# Patient Record
Sex: Male | Born: 1970 | Race: White | Hispanic: No | Marital: Married | Smoking: Never smoker
Health system: Southern US, Community
[De-identification: ages and names within clinical notes are randomized; demographics above are authoritative.]

## PROBLEM LIST (undated history)

## (undated) DIAGNOSIS — G629 Polyneuropathy, unspecified: Secondary | ICD-10-CM

## (undated) DIAGNOSIS — E119 Type 2 diabetes mellitus without complications: Secondary | ICD-10-CM

## (undated) DIAGNOSIS — I10 Essential (primary) hypertension: Secondary | ICD-10-CM

## (undated) DIAGNOSIS — G4730 Sleep apnea, unspecified: Secondary | ICD-10-CM

## (undated) DIAGNOSIS — D869 Sarcoidosis, unspecified: Secondary | ICD-10-CM

---

## 2004-04-11 ENCOUNTER — Emergency Department: Payer: Self-pay | Admitting: Unknown Physician Specialty

## 2004-11-04 ENCOUNTER — Emergency Department: Payer: Self-pay | Admitting: Emergency Medicine

## 2004-11-04 ENCOUNTER — Other Ambulatory Visit: Payer: Self-pay

## 2004-11-07 ENCOUNTER — Ambulatory Visit: Payer: Self-pay | Admitting: Emergency Medicine

## 2005-05-13 ENCOUNTER — Emergency Department: Payer: Self-pay | Admitting: Emergency Medicine

## 2005-08-19 ENCOUNTER — Emergency Department: Payer: Self-pay | Admitting: Emergency Medicine

## 2005-10-04 ENCOUNTER — Emergency Department: Payer: Self-pay | Admitting: Emergency Medicine

## 2007-05-11 ENCOUNTER — Emergency Department: Payer: Self-pay | Admitting: Emergency Medicine

## 2007-05-11 ENCOUNTER — Other Ambulatory Visit: Payer: Self-pay

## 2008-01-07 ENCOUNTER — Emergency Department: Payer: Self-pay | Admitting: Emergency Medicine

## 2008-01-26 ENCOUNTER — Emergency Department: Payer: Self-pay | Admitting: Emergency Medicine

## 2008-02-18 ENCOUNTER — Emergency Department: Payer: Self-pay | Admitting: Emergency Medicine

## 2008-07-13 ENCOUNTER — Emergency Department: Payer: Self-pay | Admitting: Emergency Medicine

## 2010-06-15 ENCOUNTER — Emergency Department: Payer: Self-pay | Admitting: Emergency Medicine

## 2010-09-05 ENCOUNTER — Emergency Department: Payer: Self-pay | Admitting: Emergency Medicine

## 2010-10-19 ENCOUNTER — Emergency Department: Payer: Self-pay | Admitting: Emergency Medicine

## 2011-09-06 ENCOUNTER — Emergency Department: Payer: Self-pay

## 2011-09-06 LAB — BASIC METABOLIC PANEL
Anion Gap: 9 (ref 7–16)
BUN: 14 mg/dL (ref 7–18)
Calcium, Total: 8.7 mg/dL (ref 8.5–10.1)
Co2: 27 mmol/L (ref 21–32)
Creatinine: 0.74 mg/dL (ref 0.60–1.30)
EGFR (African American): >60
Glucose: 279 mg/dL — ABNORMAL HIGH (ref 65–99)
Osmolality: 281 (ref 275–301)
Potassium: 4 mmol/L (ref 3.5–5.1)

## 2011-09-06 LAB — CK TOTAL AND CKMB (NOT AT ARMC)
CK, Total: 58 U/L (ref 35–232)
CK, Total: 48 U/L (ref 35–232)
CK-MB: 1.2 ng/mL (ref 0.5–3.6)
CK-MB: 0.9 ng/mL (ref 0.5–3.6)

## 2011-09-06 LAB — CBC
HGB: 16.4 g/dL (ref 13.0–18.0)
MCH: 31 pg (ref 26.0–34.0)
Platelet: 218 10*3/uL (ref 150–440)
RBC: 5.28 10*6/uL (ref 4.40–5.90)
RDW: 12 % (ref 11.5–14.5)
WBC: 8.1 10*3/uL (ref 3.8–10.6)

## 2011-09-06 LAB — TROPONIN I: Troponin-I: 0.03 ng/mL

## 2011-09-29 ENCOUNTER — Emergency Department: Payer: Self-pay | Admitting: *Deleted

## 2011-12-01 ENCOUNTER — Emergency Department: Payer: Self-pay | Admitting: Emergency Medicine

## 2012-12-01 ENCOUNTER — Inpatient Hospital Stay: Payer: Self-pay | Admitting: Internal Medicine

## 2012-12-01 LAB — HEMOGLOBIN A1C: Hemoglobin A1C: 12.3 % — ABNORMAL HIGH (ref 4.2–6.3)

## 2012-12-01 LAB — CBC WITH DIFFERENTIAL/PLATELET
Basophil #: 0.1 10*3/uL (ref 0.0–0.1)
Basophil %: 0.4 %
Eosinophil %: 0.2 %
HCT: 46.5 % (ref 40.0–52.0)
MCH: 30.9 pg (ref 26.0–34.0)
MCHC: 34.2 g/dL (ref 32.0–36.0)
Monocyte %: 8.4 %
Neutrophil %: 84 %
Platelet: 190 10*3/uL (ref 150–440)
RDW: 12.2 % (ref 11.5–14.5)
WBC: 17.4 10*3/uL — ABNORMAL HIGH (ref 3.8–10.6)

## 2012-12-01 LAB — COMPREHENSIVE METABOLIC PANEL
Albumin: 3.9 g/dL (ref 3.4–5.0)
BUN: 13 mg/dL (ref 7–18)
Bilirubin,Total: 1.1 mg/dL — ABNORMAL HIGH (ref 0.2–1.0)
Calcium, Total: 9 mg/dL (ref 8.5–10.1)
Chloride: 96 mmol/L — ABNORMAL LOW (ref 98–107)
EGFR (African American): >60
SGPT (ALT): 31 U/L (ref 12–78)
Sodium: 130 mmol/L — ABNORMAL LOW (ref 136–145)
Total Protein: 7.4 g/dL (ref 6.4–8.2)

## 2012-12-02 LAB — CBC WITH DIFFERENTIAL/PLATELET
Basophil %: 0.2 %
HGB: 14.4 g/dL (ref 13.0–18.0)
Lymphocyte %: 14.1 %
MCH: 31.1 pg (ref 26.0–34.0)
MCHC: 34.3 g/dL (ref 32.0–36.0)
Neutrophil #: 9.3 10*3/uL — ABNORMAL HIGH (ref 1.4–6.5)
Neutrophil %: 68.7 %
RBC: 4.64 10*6/uL (ref 4.40–5.90)

## 2012-12-03 LAB — CBC WITH DIFFERENTIAL/PLATELET
Basophil #: 0 10*3/uL (ref 0.0–0.1)
Basophil %: 0.3 %
HCT: 39.8 % — ABNORMAL LOW (ref 40.0–52.0)
HGB: 13.6 g/dL (ref 13.0–18.0)
Lymphocyte %: 29.3 %
MCH: 31.1 pg (ref 26.0–34.0)
MCHC: 34.2 g/dL (ref 32.0–36.0)
MCV: 91 fL (ref 80–100)
Monocyte %: 16.6 %
Neutrophil #: 4.6 10*3/uL (ref 1.4–6.5)
Neutrophil %: 50.2 %
RDW: 12.4 % (ref 11.5–14.5)

## 2012-12-03 LAB — LIPID PANEL
Cholesterol: 140 mg/dL (ref 0–200)
HDL Cholesterol: 31 mg/dL — ABNORMAL LOW (ref 40–60)
Ldl Cholesterol, Calc: 70 mg/dL (ref 0–100)
Triglycerides: 197 mg/dL (ref 0–200)
VLDL Cholesterol, Calc: 39 mg/dL (ref 5–40)

## 2012-12-04 LAB — CREATININE, SERUM
EGFR (African American): >60
EGFR (Non-African Amer.): >60

## 2012-12-06 LAB — CULTURE, BLOOD (SINGLE)

## 2013-02-27 ENCOUNTER — Emergency Department: Payer: Self-pay | Admitting: Emergency Medicine

## 2014-06-29 ENCOUNTER — Emergency Department
Admit: 2014-06-29 | Disposition: A | Discharge status: Home / Self Care | Payer: Self-pay | Admitting: Emergency Medicine

## 2014-07-03 NOTE — Discharge Summary (Signed)
PATIENT NAME:  Fernando Vega, Fernando Vega MR#:  [id number] DATE OF BIRTH:  02/05/1971  DATE OF ADMISSION:  12/01/2012 DATE OF DISCHARGE:  12/05/2012  DISCHARGE DIAGNOSES: 1.  Left leg cellulitis. 2.  Diabetes mellitus type 2.  3.  Systemic antiinflammatory response syndrome.  DISCHARGE MEDICATIONS: 1.  Percocet 5/325 mg 1 tablet every 4 hours as needed, 30 tablets are given.  2.  Metformin 500 mg p.o. b.i.d. 3.  Insulin sliding scale 4.  Levaquin 750 mg daily for 10 days.   DIET: Low sodium, ADA.  CONSULTATIONS: None.   HOSPITAL COURSE:  1.  A 44 year old male patient with history of diabetes, not taking medications, came in because of pain and swelling and redness around the left ankle for about 2 to 3 days. The patient had cracked skin on the heel and noticed redness and swelling in the left lower extremity. The patient had x-ray of the ankle which was negative for any foreign body. An extremity ultrasound of the left leg did not show any DVT. The patient was admitted to hospitalist service because of cellulitis. The patient had SIRS presentation on admission with temperature 99, heart rate 102 and white count 17,000. So he was admitted to the hospitalist service. He was started on IV fluids along with cefazolin. The patient persistently had redness and swelling and tachycardia. I changed antibiotic to the vanc and Levaquin and then the patient received vanc and Levaquin. Today will be the third day. The patient's swelling and redness are improved. He is afebrile with temperature 97.9 today and tachycardia also resolved. Heart rate is 75. The patient's WBC was down from 17.4 to 9.1 on 09/23. The patient will be going on Levaquin for 10 days.  2.  Diabetes mellitus type 2. The patient stopped medication.  Hemoglobin A1c is 12.3 and started on metformin and got diabetic education, got a glucometer. Blood cultures were negative. The patient's sugars have been around 200 range, but we just started metformin so  I advised him to follow up with the [facility]. The patient does not have a primary doctor. [name] LDL was at 70 on admission and his blood pressure is around 117/78, heart rate 75. Stable for discharge. He needs a followup appointment regarding his diabetes, with his foot care, also eye care, routine labs and following up of Hemoglobin A1c in 3 months.   TIME SPENT ON DISCHARGE PREPARATION: More than 30 minutes.  ____________________________ Fernando Lesches, MD sk:sb D: 12/05/2012 11:44:28 ET T: 12/05/2012 12:07:06 ET JOB#: [id number]  cc: Fernando Lesches, MD, <Dictator> Fernando Lesches MD ELECTRONICALLY SIGNED 12/24/2012 10:48

## 2014-07-03 NOTE — H&P (Signed)
PATIENT NAME:  Fernando Vega, Fernando Vega Fernando#:  [id number] DATE OF BIRTH:  [birth year]  DATE OF ADMISSION:  12/01/2012  PRIMARY CARE PHYSICIAN: None.   CHIEF COMPLAINT: Left lower extremity swelling and pain around the ankle for 2 to 3 days. Fernando Vega is a 44 year old Caucasian gentleman with past medical history of sarcoidosis,  type 2 diabetes and hypertension who has been noncompliant with his meds for many years, comes in with redness, swelling and pain at the left ankle. The patient has very dry skin around his ankle and has some cracked skin on it heel and started noticing some redness with swelling and pain in his left lower extremity. He was found to have cellulitis. He reports cellulitis around the left ankle. He reports no injury to the left ankle or left heel. X-ray of the left ankle is negative for any foreign body. He did receive vancomycin and Zosyn in the Emergency Room. White count is 17,000. He is being admitted for SIRS secondary to left lower extremity cellulitis. He was found to have temperature of 99 with pulse of 102.   PAST MEDICAL HISTORY:  1.  Sarcoidosis, not on any treatment.  2.  Hypertension.  3.  Type 2 diabetes.  4.  History of tonsillectomy.   MEDICATIONS: None.   ALLERGIES: No known drug allergies.   SOCIAL HISTORY: Lives at home with wife, nonsmoker. 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   FAMILY HISTORY: Positive for diabetes in father.   REVIEW OF SYSTEMS:  CONSTITUTIONAL: No fever, fatigue, weakness.  EYES: No blurred or double vision. No glaucoma.  ENT: No tinnitus, ear pain, hearing loss or epistaxis.  RESPIRATORY: No cough, wheeze, hemoptysis or chronic obstructive pulmonary disease.   CARDIOVASCULAR: No chest pain, orthopnea, edema or arrhythmia.  GASTROINTESTINAL: No nausea, vomiting, diarrhea, abdominal pain or hematemesis.  GENITOURINARY: No dysuria, hematuria, frequency.  ENDOCRINE: No polyuria, nocturia or thyroid problems.  HEMATOLOGY: No  anemia or easy bruising.  SKIN: Positive for cellulitis, erythema over the left ankle.  MUSCULOSKELETAL: No arthritis, swelling or gout. NEUROLOGIC:  No CVA, transient ischemic attack or dementia.  PSYCHIATRIC: No anxiety or depression.  All other systems reviewed are negative.   PHYSICAL EXAMINATION: GENERAL: The patient is awake, alert, oriented x 3, not in acute distress.  VITAL SIGNS:  Temperature is 99, pulse is 102 and regular,  respirations 18, blood pressure 127/80. Pulse oximetry is 98% on room air.  HEENT: Atraumatic, normocephalic. Pupils are equal, round and reactive to light and accommodation. EOM intact. Oral mucosa is moist.  NECK: Supple. No JVD. No carotid bruit.  LUNGS: Clear to auscultation bilaterally. No rales, rhonchi, respiratory distress or labored breathing.  CARDIOVASCULAR:  Both the heart sounds are normal. Rate is tachycardic, rhythm is regular. PMI not lateralized. Chest is nontender good pedal pulses, good femoral pulses. There is 1+ pitting edema. In the left lower extremity, there is cellulitis over the distal part of the tibial shin. The patient has cracked skin over the heel. There is no serosanguineous  or any abscess formation.  NEUROLOGIC: Grossly intact cranial nerves II through XII. No motor deficit. The patient does have mild peripheral sensory neuropathy bilaterally in the lower extremities.  PSYCHIATRIC: The patient is awake, alert, oriented x3.   LABORATORY, DIAGNOSTIC AND RADIOLOGIC DATA:  White count is 17.4. CBC within normal limits. Glucose is 272, BUN is 13, creatinine is 0.7, sodium 130, potassium is 3.9, chloride is 96. LFTs within normal limits.   X-ray  of the ankle shows no acute osseous injury to the left ankle.   Ultrasound Doppler lower extremity: No deep vein thrombosis on the left.   ASSESSMENT AND PLAN: A 44 year old Fernando Vega with history of hypertension, diabetes and sarcoidosis, comes in with:  1. Systemic inflammatory response  syndrome secondary to left lower extremity cellulitis. The patient is going to be admitted on the medical floor. We will give continue IV cefazolin . The patient does not have any major open wounds other than cracked skin on her left heel. Foot care was advised to the patient, given his diabetes and to consider outpatient podiatry follow up if possible. The patient does have financial constraints. He is asked to wear special padded boot to avoid any callus or corn formation in the foot. We will follow up blood cultures white count.  2.  Type 2 diabetes, uncontrolled and untreated secondary to financial constraints. I will start the patient on metformin 500 b.i.d., Accu-Cheks around-the-clock. The patient recommended to check sugars and dietary counseling was done.  3.  History of hypertension. Blood pressure at present is stable. I will hold off on any antihypertensives at this time. If blood pressure remains on the high side, consider starting ACE inhibitor is in the setting of diabetes.  4.  Deep vein thrombosis prophylaxis with subcutaneous heparin.  5.  Further work-up according to the patient's clinical course. Hospital admission plan was discussed with the patient and the patient's wife.   TIME SPENT: 50 minutes.    ____________________________ Hart Rochester Posey Pronto, MD sap:cc D: 12/01/2012 18:26:18 ET T: 12/01/2012 19:16:07 ET JOB#: [id number]  cc: Fernando Jahn A. Posey Pronto, MD, <Dictator> Ilda Basset MD ELECTRONICALLY SIGNED 12/02/2012 13:14

## 2014-11-01 ENCOUNTER — Emergency Department: Payer: Self-pay

## 2014-11-01 ENCOUNTER — Emergency Department
Admission: EM | Admit: 2014-11-01 | Discharge: 2014-11-01 | Disposition: A | Discharge status: Home / Self Care | Payer: Self-pay | Attending: Emergency Medicine | Admitting: Emergency Medicine

## 2014-11-01 ENCOUNTER — Encounter: Payer: Self-pay | Admitting: Emergency Medicine

## 2014-11-01 DIAGNOSIS — Y9289 Other specified places as the place of occurrence of the external cause: Secondary | ICD-10-CM | POA: Insufficient documentation

## 2014-11-01 DIAGNOSIS — S53402A Unspecified sprain of left elbow, initial encounter: Secondary | ICD-10-CM | POA: Insufficient documentation

## 2014-11-01 DIAGNOSIS — S46912A Strain of unspecified muscle, fascia and tendon at shoulder and upper arm level, left arm, initial encounter: Secondary | ICD-10-CM

## 2014-11-01 DIAGNOSIS — Y9389 Activity, other specified: Secondary | ICD-10-CM | POA: Insufficient documentation

## 2014-11-01 DIAGNOSIS — Y990 Civilian activity done for income or pay: Secondary | ICD-10-CM | POA: Insufficient documentation

## 2014-11-01 DIAGNOSIS — E119 Type 2 diabetes mellitus without complications: Secondary | ICD-10-CM | POA: Insufficient documentation

## 2014-11-01 DIAGNOSIS — X58XXXA Exposure to other specified factors, initial encounter: Secondary | ICD-10-CM | POA: Insufficient documentation

## 2014-11-01 HISTORY — DX: Polyneuropathy, unspecified: G62.9

## 2014-11-01 HISTORY — DX: Type 2 diabetes mellitus without complications: E11.9

## 2014-11-01 MED ORDER — ETODOLAC 400 MG PO TABS: 400.0000 mg | ORAL_TABLET | Freq: Two times a day (BID) | ORAL | Status: DC

## 2014-11-01 MED ORDER — HYDROCODONE-ACETAMINOPHEN 5-325 MG PO TABS: 1.0000 | ORAL_TABLET | ORAL | Status: DC | PRN

## 2014-11-01 MED ORDER — HYDROCODONE-ACETAMINOPHEN 5-325 MG PO TABS
1.0000 | ORAL_TABLET | Freq: Once | ORAL | Status: AC
  Administered 2014-11-01: 1 via ORAL
  Filled 2014-11-01: qty 1

## 2014-11-01 NOTE — Discharge Instructions (Signed)
Cryotherapy Cryotherapy is when you put ice on your injury. Ice helps lessen pain and puffiness (swelling) after an injury. Ice works the best when you start using it in the first 24 to 48 hours after an injury. HOME CARE  Put a dry or damp towel between the ice pack and your skin.  You may press gently on the ice pack.  Leave the ice on for no more than 10 to 20 minutes at a time.  Check your skin after 5 minutes to make sure your skin is okay.  Rest at least 20 minutes between ice pack uses.  Stop using ice when your skin loses feeling (numbness).  Do not use ice on someone who cannot tell you when it hurts. This includes small children and people with memory problems (dementia). GET HELP RIGHT AWAY IF:  You have white spots on your skin.  Your skin turns blue or pale.  Your skin feels waxy or hard.  Your puffiness gets worse. MAKE SURE YOU:   Understand these instructions.  Will watch your condition.  Will get help right away if you are not doing well or get worse. Document Released: 08/16/2007 Document Revised: 05/22/2011 Document Reviewed: 10/20/2010 Exit[employer] Patient Information 2015 [employer]. This information is not intended to replace advice given to you by your health care provider. Make sure you discuss any questions you have with your health care provider.  

## 2014-11-01 NOTE — ED Provider Notes (Signed)
[facility] Emergency Department Provider Note  ____________________________________________  Time seen: Approximately 12:59 PM  I have reviewed the triage vital signs and the nursing notes.   HISTORY  Chief Complaint Arm Pain and Elbow Pain   HPI Fernando Vega is a 44 y.o. male is here complaining of left elbow pain 2 days. He states that he was working on the back of his truck and lifted a bag. He states he felt an immediate "pop" in his left elbow. He has not taken any medication for this. Today his pain has increased greatly and he is unable to move his elbow. He denies any previous injury to his elbow. Currently his pain is 10 out of 10   Past Medical History  Diagnosis Date  . Diabetes mellitus without complication   . Neuropathy     There are no active problems to display for this patient.   History reviewed. No pertinent past surgical history.  Current Outpatient Rx  Name  Route  Sig  Dispense  Refill  . etodolac (LODINE) 400 MG tablet   Oral   Take 1 tablet (400 mg total) by mouth 2 (two) times daily.   20 tablet   0   . HYDROcodone-acetaminophen (NORCO/VICODIN) 5-325 MG per tablet   Oral   Take 1 tablet by mouth every 4 (four) hours as needed for moderate pain.   20 tablet   0     Allergies Review of patient's allergies indicates no known allergies.  No family history on file.  Social History Social History  Substance Use Topics  . Smoking status: Never Smoker   . Smokeless tobacco: None  . Alcohol Use: No    Review of Systems Constitutional: No fever/chills Cardiovascular: Denies chest pain. Respiratory: Denies shortness of breath. Gastrointestinal: No abdominal pain.  No nausea, no vomiting.  Genitourinary: Negative for dysuria. Musculoskeletal: Negative for back pain. Skin: Negative for rash. Neurological: Negative for headaches, focal weakness or numbness.  10-point ROS otherwise  negative.  ____________________________________________   PHYSICAL EXAM:  VITAL SIGNS: ED Triage Vitals  Enc Vitals Group     BP 11/01/14 1134 116/76 mmHg     Pulse Rate 11/01/14 1134 95     Resp 11/01/14 1134 18     Temp 11/01/14 1134 98 F (36.7 C)     Temp Source 11/01/14 1134 Oral     SpO2 11/01/14 1134 100 %     Weight 11/01/14 1134 196 lb (88.905 kg)     Height 11/01/14 1134 5\' 7"  (1.702 m)     Head Cir --      Peak Flow --      Pain Score 11/01/14 1135 10     Pain Loc --      Pain Edu? --      Excl. in [place]? --     Constitutional: Alert and oriented. Well appearing and in no acute distress. Patient sleeping Eyes: Conjunctivae are normal. PERRL. EOMI. Head: Atraumatic. Nose: No congestion/rhinnorhea. Neck: No stridor.   Cardiovascular: Normal rate, regular rhythm. Grossly normal heart sounds.  Good peripheral circulation. Respiratory: Normal respiratory effort.  No retractions. Lungs CTAB. Gastrointestinal: Soft and nontender. No distention Musculoskeletal: Examination of left elbow no gross deformity. There is moderate tenderness on palpation lateral and posterior aspect. Range of motion is restricted secondary to patient's pain. No effusion was noted area No lower extremity tenderness nor edema. Neurologic:  Normal speech and language. No gross focal neurologic deficits are appreciated. No  gait instability. Skin:  Skin is warm, dry and intact. No rash noted. No erythema or ecchymosis Psychiatric: Mood and affect are normal. Speech and behavior are normal.  ____________________________________________   LABS (all labs ordered are listed, but only abnormal results are displayed)  Labs Reviewed - No data to display _ RADIOLOGY  Left elbow x-ray per radiologist and reviewed by me no evidence of fracture, dislocation or effusion. I, [name], personally viewed and evaluated these images (plain radiographs) as part of my medical decision making.   ____________________________________________   PROCEDURES  Procedure(s) performed: None  Critical Care performed: No  ____________________________________________   INITIAL IMPRESSION / ASSESSMENT AND PLAN / ED COURSE  Pertinent labs & imaging results that were available during my care of the patient were reviewed by me and considered in my medical decision making (see chart for details).  Patient was placed in a sling, he is to continue using ice, and he is given a prescription for Norco for pain. He is to follow-up with orthopedist if any continued problems. ____________________________________________   FINAL CLINICAL IMPRESSION(S) / ED DIAGNOSES  Final diagnoses:  Elbow strain, left, initial encounter      [name], PA-C 11/01/14 1408  Harvest Dark, MD 11/01/14 1525

## 2014-11-01 NOTE — ED Notes (Signed)
Pt states on Friday he was working with a tote on the back of his truck and he fell and felt a "pop" in left elbow

## 2015-01-18 ENCOUNTER — Emergency Department: Payer: Self-pay

## 2015-01-18 ENCOUNTER — Encounter: Payer: Self-pay | Admitting: Emergency Medicine

## 2015-01-18 ENCOUNTER — Emergency Department
Admission: EM | Admit: 2015-01-18 | Discharge: 2015-01-18 | Disposition: A | Discharge status: Home / Self Care | Payer: Self-pay | Attending: Emergency Medicine | Admitting: Emergency Medicine

## 2015-01-18 DIAGNOSIS — R51 Headache: Secondary | ICD-10-CM | POA: Insufficient documentation

## 2015-01-18 DIAGNOSIS — S0501XA Injury of conjunctiva and corneal abrasion without foreign body, right eye, initial encounter: Secondary | ICD-10-CM | POA: Insufficient documentation

## 2015-01-18 DIAGNOSIS — Y9289 Other specified places as the place of occurrence of the external cause: Secondary | ICD-10-CM | POA: Insufficient documentation

## 2015-01-18 DIAGNOSIS — Z791 Long term (current) use of non-steroidal anti-inflammatories (NSAID): Secondary | ICD-10-CM | POA: Insufficient documentation

## 2015-01-18 DIAGNOSIS — Y9389 Activity, other specified: Secondary | ICD-10-CM | POA: Insufficient documentation

## 2015-01-18 DIAGNOSIS — E119 Type 2 diabetes mellitus without complications: Secondary | ICD-10-CM | POA: Insufficient documentation

## 2015-01-18 DIAGNOSIS — X58XXXA Exposure to other specified factors, initial encounter: Secondary | ICD-10-CM | POA: Insufficient documentation

## 2015-01-18 DIAGNOSIS — Y998 Other external cause status: Secondary | ICD-10-CM | POA: Insufficient documentation

## 2015-01-18 MED ORDER — FLUORESCEIN SODIUM 1 MG OP STRP
ORAL_STRIP | OPHTHALMIC | Status: AC
  Filled 2015-01-18: qty 1

## 2015-01-18 MED ORDER — TETRACAINE HCL 0.5 % OP SOLN
2.0000 [drp] | Freq: Once | OPHTHALMIC | Status: AC
  Administered 2015-01-18: 2 [drp] via OPHTHALMIC
  Filled 2015-01-18: qty 2

## 2015-01-18 MED ORDER — CIPROFLOXACIN HCL 0.3 % OP SOLN: 2.0000 [drp] | OPHTHALMIC | Status: DC

## 2015-01-18 MED ORDER — CIPROFLOXACIN HCL 0.3 % OP SOLN: 1.0000 [drp] | OPHTHALMIC | Status: AC

## 2015-01-18 MED ORDER — CIPROFLOXACIN HCL 0.3 % OP SOLN
OPHTHALMIC | Status: AC
  Administered 2015-01-18: 2 [drp]
  Filled 2015-01-18: qty 2.5

## 2015-01-18 MED ORDER — OXYCODONE-ACETAMINOPHEN 5-325 MG PO TABS: 1.0000 | ORAL_TABLET | Freq: Four times a day (QID) | ORAL | Status: DC | PRN

## 2015-01-18 MED ORDER — OXYCODONE-ACETAMINOPHEN 5-325 MG PO TABS
1.0000 | ORAL_TABLET | Freq: Once | ORAL | Status: AC
  Administered 2015-01-18: 1 via ORAL
  Filled 2015-01-18: qty 1

## 2015-01-18 MED ORDER — FLUORESCEIN SODIUM 1 MG OP STRP
1.0000 | ORAL_STRIP | Freq: Once | OPHTHALMIC | Status: DC
  Filled 2015-01-18: qty 1

## 2015-01-18 NOTE — ED Notes (Signed)
Pt reports right eye pain since last night; unknown if foreign body. Pt reports "feels like it's gonna pop out of my head"; denies itching or burning, describes it as pressure. White of eye is reddened at this time.

## 2015-01-18 NOTE — Discharge Instructions (Signed)

## 2015-01-18 NOTE — ED Provider Notes (Signed)
CSN: [id number]     Arrival date & time 01/18/15  1554 History   First MD Initiated Contact with Patient 01/18/15 1643     Chief Complaint  Patient presents with  . Eye Pain     (Consider location/radiation/quality/duration/timing/severity/associated sxs/prior Treatment) Patient is a 44 y.o. male presenting with eye pain.  Eye Pain Associated symptoms include headaches. Pertinent negatives include no abdominal pain, chest pain, chills, congestion, coughing, fever, nausea, rash, sore throat or vomiting.    Eye pain began 1 day ago. He denies any trauma or injury. Patient did state he was rubbing his eye when he developed the discomfort. Today, vision became blurry. Pain is severe, increased with blinking. Pain is increased with bright lights. There is no history of foreign body. Patient does not work with metal shavings. He denies any drainage warmth or fevers. He denies any spots, flashes of light, halos, curtain dropping over his vision. He does complain of a headache on the right side. Headache has been present since right eye pain. He has no history of glaucoma. He does not wear contact lenses.  He had a similar episode in his left eye last year, which he says was an infection that was successfully treated.  Past Medical History  Diagnosis Date  . Diabetes mellitus without complication ([place])   . Neuropathy ([place])    History reviewed. No pertinent past surgical history. No family history on file. Social History  Substance Use Topics  . Smoking status: Never Smoker   . Smokeless tobacco: None  . Alcohol Use: No    Review of Systems  Constitutional: Negative.  Negative for fever, chills, activity change and appetite change.  HENT: Negative for congestion, ear discharge, ear pain, rhinorrhea, sinus pressure and sore throat.   Eyes: Positive for photophobia, pain and redness. Negative for discharge, itching and visual disturbance.       No foreign body sensation.  Respiratory:  Negative for cough, chest tightness and shortness of breath.   Cardiovascular: Negative for chest pain and palpitations.  Gastrointestinal: Negative for nausea, vomiting, abdominal pain, diarrhea and abdominal distention.  Skin: Negative for color change and rash.  Neurological: Positive for headaches. Negative for dizziness and light-headedness.  Hematological: Negative for adenopathy.  Psychiatric/Behavioral: Negative for behavioral problems and agitation.      Allergies  Review of patient's allergies indicates no known allergies.  Home Medications   Prior to Admission medications   Medication Sig Start Date End Date Taking? Authorizing Provider  ciprofloxacin (CILOXAN) 0.3 % ophthalmic solution Place 1 drop into both eyes every 2 (two) hours. Administer 1 drop, every 2 hours, while awake, for 2 days. Then 1 drop, every 4 hours, while awake, for the next 5 days. 01/18/15 01/23/15  Duanne Guess, PA-C  etodolac (LODINE) 400 MG tablet Take 1 tablet (400 mg total) by mouth 2 (two) times daily. 11/01/14   [name], PA-C  HYDROcodone-acetaminophen (NORCO/VICODIN) 5-325 MG per tablet Take 1 tablet by mouth every 4 (four) hours as needed for moderate pain. 11/01/14   [name], PA-C  oxyCODONE-acetaminophen (ROXICET) 5-325 MG tablet Take 1-2 tablets by mouth every 6 (six) hours as needed. 01/18/15 01/18/16  Duanne Guess, PA-C   BP 136/84 mmHg  Pulse 101  Temp(Src) 97.8 F (36.6 C) (Oral)  Resp 16  Ht 5\' 7"  (1.702 m)  Wt 201 lb (91.173 kg)  BMI 31.47 kg/m2  SpO2 100% Physical Exam  Constitutional: He is oriented to person, place, and  time. He appears well-developed and well-nourished.  HENT:  Head: Normocephalic.  Right Ear: External ear normal.  Left Ear: External ear normal.  Nose: Nose normal.  Mouth/Throat: Oropharynx is clear and moist. No oropharyngeal exudate.  No temporal swelling or tenderness to palpation  Eyes: EOM are normal. Pupils are equal, round, and  reactive to light. Right eye exhibits no chemosis, no discharge and no exudate. No foreign body present in the right eye. Right conjunctiva is injected. Right conjunctiva has no hemorrhage. No scleral icterus. Right eye exhibits normal extraocular motion. Right pupil is round and reactive. Pupils are equal.  Slit lamp exam:      The right eye shows fluorescein uptake.    Right conjunctival injection. No sign of foreign body. Eyelids everted. Tonometer shows pressures of 14,15, 16,19.  Neck: Neck supple.  Cardiovascular: Regular rhythm and normal heart sounds.   Mildly tachycardic (101 bpm)  Pulmonary/Chest: Effort normal and breath sounds normal.  Abdominal: Mass: umbilical hernia.  Lymphadenopathy:    He has no cervical adenopathy.  Neurological: He is alert and oriented to person, place, and time.  Skin: Skin is warm and dry.  Psychiatric: He has a normal mood and affect. His behavior is normal.    ED Course  Procedures (including critical care time) Fluorescein stain Wood's lamp procedure: Flourescein dye uptake was seen along the cornea at 8:00. Tonometry average pressures 14, 15, 16, 19.  Visual acuity screening: 20/70 right eye, 20/40 left eye Labs Review Labs Reviewed - No data to display  Imaging Review Ct Head Wo Contrast  01/18/2015  CLINICAL DATA:  Headache and right eye pain since 01/17/2015. EXAM: CT HEAD WITHOUT CONTRAST TECHNIQUE: Contiguous axial images were obtained from the base of the skull through the vertex without intravenous contrast. COMPARISON:  None. FINDINGS: There is no evidence of acute intracranial abnormality including hemorrhage, infarct, mass lesion, mass effect, midline shift or abnormal extra-axial fluid collection. No hydrocephalus or pneumocephalus. The calvarium is intact. Imaged paranasal sinuses and mastoid air cells are clear. IMPRESSION: Negative head CT. Electronically Signed   By: Inge Rise M.D.   On: 01/18/2015 19:25   I have  personally reviewed and evaluated these images and lab results as part of my medical decision-making.   EKG Interpretation None      MDM   Final diagnoses:  Corneal abrasion, right, initial encounter    44 year old male with sudden onset of right eye pain. Pain occurred after rubbing his eye. He describes the pain as pressure with a associated right-sided headache. Vital signs are stable. No temporal tenderness. Tonometer showed no elevated intraocular pressures. No signs of infection on exam. Flourocein stain showed uptake along the cornea indicating corneal abrasion. Patient was placed on topical antibiotic eyedrops. Given pain medications. He will follow-up with ophthalmologist tomorrow at telephone call.    Duanne Guess, PA-C 01/18/15 2022  Ahmed Prima, MD 01/18/15 [phone]

## 2015-09-12 ENCOUNTER — Emergency Department: Payer: Self-pay

## 2015-09-12 ENCOUNTER — Emergency Department
Admission: EM | Admit: 2015-09-12 | Discharge: 2015-09-12 | Disposition: A | Discharge status: Home / Self Care | Payer: Self-pay | Attending: Emergency Medicine | Admitting: Emergency Medicine

## 2015-09-12 ENCOUNTER — Encounter: Payer: Self-pay | Admitting: Emergency Medicine

## 2015-09-12 DIAGNOSIS — S7002XA Contusion of left hip, initial encounter: Secondary | ICD-10-CM | POA: Insufficient documentation

## 2015-09-12 DIAGNOSIS — W172XXA Fall into hole, initial encounter: Secondary | ICD-10-CM | POA: Insufficient documentation

## 2015-09-12 DIAGNOSIS — Z791 Long term (current) use of non-steroidal anti-inflammatories (NSAID): Secondary | ICD-10-CM | POA: Insufficient documentation

## 2015-09-12 DIAGNOSIS — E119 Type 2 diabetes mellitus without complications: Secondary | ICD-10-CM | POA: Insufficient documentation

## 2015-09-12 DIAGNOSIS — S7012XA Contusion of left thigh, initial encounter: Secondary | ICD-10-CM | POA: Insufficient documentation

## 2015-09-12 DIAGNOSIS — Y92007 Garden or yard of unspecified non-institutional (private) residence as the place of occurrence of the external cause: Secondary | ICD-10-CM | POA: Insufficient documentation

## 2015-09-12 DIAGNOSIS — Y9344 Activity, trampolining: Secondary | ICD-10-CM | POA: Insufficient documentation

## 2015-09-12 DIAGNOSIS — Y999 Unspecified external cause status: Secondary | ICD-10-CM | POA: Insufficient documentation

## 2015-09-12 HISTORY — DX: Sarcoidosis, unspecified: D86.9

## 2015-09-12 HISTORY — DX: Sleep apnea, unspecified: G47.30

## 2015-09-12 NOTE — ED Notes (Signed)
Patient to ER for c/o left leg/left hip pain after fall x2.

## 2015-09-12 NOTE — Discharge Instructions (Signed)
Contusion A contusion is a deep bruise. Contusions happen when an injury causes bleeding under the skin. Symptoms of bruising include pain, swelling, and discolored skin. The skin may turn blue, purple, or yellow. HOME CARE   Rest the injured area.  If told, put ice on the injured area.  Put ice in a plastic bag.  Place a towel between your skin and the bag.  Leave the ice on for 20 minutes, 2-3 times per day.  If told, put light pressure (compression) on the injured area using an elastic bandage. Make sure the bandage is not too tight. Remove it and put it back on as told by your doctor.  If possible, raise (elevate) the injured area above the level of your heart while you are sitting or lying down.  Take over-the-counter and prescription medicines only as told by your doctor. GET HELP IF:  Your symptoms do not get better after several days of treatment.  Your symptoms get worse.  You have trouble moving the injured area. GET HELP RIGHT AWAY IF:   You have very bad pain.  You have a loss of feeling (numbness) in a hand or foot.  Your hand or foot turns pale or cold.   This information is not intended to replace advice given to you by your health care provider. Make sure you discuss any questions you have with your health care provider.   Document Released: 08/16/2007 Document Revised: 11/18/2014 Document Reviewed: 07/15/2014 Elsevier Interactive Patient Education 2016 Elsevier Inc[employer]  Cryotherapy Cryotherapy is when you put ice on your injury. Ice helps lessen pain and puffiness (swelling) after an injury. Ice works the best when you start using it in the first 24 to 48 hours after an injury. HOME CARE  Put a dry or damp towel between the ice pack and your skin.  You may press gently on the ice pack.  Leave the ice on for no more than 10 to 20 minutes at a time.  Check your skin after 5 minutes to make sure your skin is okay.  Rest at least 20 minutes between ice  pack uses.  Stop using ice when your skin loses feeling (numbness).  Do not use ice on someone who cannot tell you when it hurts. This includes small children and people with memory problems (dementia). GET HELP RIGHT AWAY IF:  You have white spots on your skin.  Your skin turns blue or pale.  Your skin feels waxy or hard.  Your puffiness gets worse. MAKE SURE YOU:   Understand these instructions.  Will watch your condition.  Will get help right away if you are not doing well or get worse.   This information is not intended to replace advice given to you by your health care provider. Make sure you discuss any questions you have with your health care provider.   Document Released: 08/16/2007 Document Revised: 05/22/2011 Document Reviewed: 10/20/2010 Elsevier Interactive Patient Education 2016 Elsevier Inc.    Take Tylenol or ibuprofen as needed for pain. Use ice to your hip area to reduce pain and inflammation. Follow-up with [facility] if any continued problems.

## 2015-09-12 NOTE — ED Notes (Signed)
Pt verbalized understanding of discharge instructions. NAD at this time. 

## 2015-09-12 NOTE — ED Notes (Signed)
Pt states he fell on his left hip about 2 weeks ago when moving an old trampoline. Then yesterday pt fell after tripping on some concrete back onto his left side.  Pt also states his left leg is swollen and has a hx having to be admitted due to an infection in his leg.

## 2015-09-12 NOTE — ED Provider Notes (Signed)
[facility] Emergency Department Provider Note   ____________________________________________  Time seen: Approximately 2:08 PM  I have reviewed the triage vital signs and the nursing notes.   HISTORY  Chief Complaint Fall and Leg Pain   HPI Fernando Vega is a 45 y.o. male is here complaining of left hip pain for approximately 2 weeks. Patient states that 2 weeks ago he was moving a trampoline and stepped into a hole in the yard causing him to fall on his left side at that time. He has not taken any over-the-counter medication and was starting to get somewhat better until last evening when he tripped and fell landing on the concrete once again on his left hip. He states that his left leg is swollen and he is concerned about fractures. Patient has increased pain with range of motion of his left hip. He denies any paresthesias into his lower leg. Patient has continued to be ambulatory despite his injury.Currently he rates his pain as 10 over 10. Patient denies any head injury or loss of consciousness.   Past Medical History  Diagnosis Date  . Diabetes mellitus without complication ([place])   . Neuropathy ([place])   . Sarcoidosis ([place])   . Sleep apnea     There are no active problems to display for this patient.   History reviewed. No pertinent past surgical history.  Current Outpatient Rx  Name  Route  Sig  Dispense  Refill  . etodolac (LODINE) 400 MG tablet   Oral   Take 1 tablet (400 mg total) by mouth 2 (two) times daily.   20 tablet   0   . HYDROcodone-acetaminophen (NORCO/VICODIN) 5-325 MG per tablet   Oral   Take 1 tablet by mouth every 4 (four) hours as needed for moderate pain.   20 tablet   0   . oxyCODONE-acetaminophen (ROXICET) 5-325 MG tablet   Oral   Take 1-2 tablets by mouth every 6 (six) hours as needed.   20 tablet   0     Allergies Review of patient's allergies indicates no known allergies.  No family history on  file.  Social History Social History  Substance Use Topics  . Smoking status: Never Smoker   . Smokeless tobacco: None  . Alcohol Use: No    Review of Systems Constitutional: No fever/chills ENT: No trauma Cardiovascular: Denies chest pain. Respiratory: Denies shortness of breath. Gastrointestinal: No abdominal pain.  No nausea, no vomiting.   Musculoskeletal: Negative for back pain. Positive for left hip pain. Skin: Negative for rash. Neurological: Negative for headaches, focal weakness or numbness.  10-point ROS otherwise negative.  ____________________________________________   PHYSICAL EXAM:  VITAL SIGNS: ED Triage Vitals  Enc Vitals Group     BP 09/12/15 1324 114/71 mmHg     Pulse Rate 09/12/15 1324 93     Resp 09/12/15 1324 20     Temp 09/12/15 1324 98.1 F (36.7 C)     Temp Source 09/12/15 1324 Oral     SpO2 09/12/15 1324 96 %     Weight 09/12/15 1324 192 lb (87.091 kg)     Height 09/12/15 1324 5\' 6"  (1.676 m)     Head Cir --      Peak Flow --      Pain Score 09/12/15 1331 10     Pain Loc --      Pain Edu? --      Excl. in [place]? --     Constitutional:  Alert and oriented. Well appearing and in no acute distress. Eyes: Conjunctivae are normal. PERRL. EOMI. Head: Atraumatic. Nose: No congestion/rhinnorhea. Neck: No stridor.  Cervical spine nontender to palpation posteriorly. Cardiovascular: Normal rate, regular rhythm. Grossly normal heart sounds.  Good peripheral circulation. Respiratory: Normal respiratory effort.  No retractions. Lungs CTAB. Gastrointestinal: Soft and nontender. No distention.  Musculoskeletal: Examination of left lower extremity there is no gross deformity noted. There is moderate tenderness on palpation of the left hip both laterally and posteriorly. Range of motion is guarded secondary to pain. Patient has increased pain with abduction as well as abduction of the lower extremity in the hip region. There is no tenderness or effusion noted  to the left knee. Neurologic:  Normal speech and language. No gross focal neurologic deficits are appreciated. Gait was not tested secondary to patient's pain. Skin:  Skin is warm, dry and intact. No rash noted. No ecchymosis, erythema or abrasions were noted. Psychiatric: Mood and affect are normal. Speech and behavior are normal.  ____________________________________________   LABS (all labs ordered are listed, but only abnormal results are displayed)  Labs Reviewed - No data to display ____________________________________________   RADIOLOGY  X-ray left hip per radiologist shows no evidence of hip fracture dislocation. I, [name], personally viewed and evaluated these images (plain radiographs) as part of my medical decision making, as well as reviewing the written report by the radiologist. ____________________________________________   PROCEDURES  Procedure(s) performed: None  Critical Care performed: No  ____________________________________________   INITIAL IMPRESSION / ASSESSMENT AND PLAN / ED COURSE  Pertinent labs & imaging results that were available during my care of the patient were reviewed by me and considered in my medical decision making (see chart for details).  Patient says he does not take over-the-counter medication or pills anytime. He was encouraged to take some ibuprofen if needed for inflammation however it is doubtful that he will do that. He was encouraged to use some ice to his hip as needed for pain and he'll follow up with his primary care doctor if any continued problems. ____________________________________________   FINAL CLINICAL IMPRESSION(S) / ED DIAGNOSES  Final diagnoses:  Contusion of left hip and thigh, initial encounter      NEW MEDICATIONS STARTED DURING THIS VISIT:  Discharge Medication List as of 09/12/2015  3:12 PM       Note:  This document was prepared using Dragon voice recognition software and may include  unintentional dictation errors.    [name], PA-C 09/12/15 [place], MD 09/13/15 [phone]

## 2015-12-07 ENCOUNTER — Emergency Department
Admission: EM | Admit: 2015-12-07 | Discharge: 2015-12-07 | Disposition: A | Discharge status: Home / Self Care | Payer: No Typology Code available for payment source | Attending: Emergency Medicine | Admitting: Emergency Medicine

## 2015-12-07 ENCOUNTER — Encounter: Payer: Self-pay | Admitting: Medical Oncology

## 2015-12-07 DIAGNOSIS — H9202 Otalgia, left ear: Secondary | ICD-10-CM | POA: Insufficient documentation

## 2015-12-07 DIAGNOSIS — Y999 Unspecified external cause status: Secondary | ICD-10-CM | POA: Insufficient documentation

## 2015-12-07 DIAGNOSIS — Y9389 Activity, other specified: Secondary | ICD-10-CM | POA: Insufficient documentation

## 2015-12-07 DIAGNOSIS — M7918 Myalgia, other site: Secondary | ICD-10-CM

## 2015-12-07 DIAGNOSIS — S161XXA Strain of muscle, fascia and tendon at neck level, initial encounter: Secondary | ICD-10-CM | POA: Diagnosis not present

## 2015-12-07 DIAGNOSIS — S46912A Strain of unspecified muscle, fascia and tendon at shoulder and upper arm level, left arm, initial encounter: Secondary | ICD-10-CM | POA: Insufficient documentation

## 2015-12-07 DIAGNOSIS — Y92410 Unspecified street and highway as the place of occurrence of the external cause: Secondary | ICD-10-CM | POA: Diagnosis not present

## 2015-12-07 DIAGNOSIS — E119 Type 2 diabetes mellitus without complications: Secondary | ICD-10-CM | POA: Diagnosis not present

## 2015-12-07 DIAGNOSIS — S199XXA Unspecified injury of neck, initial encounter: Secondary | ICD-10-CM | POA: Diagnosis present

## 2015-12-07 MED ORDER — METHOCARBAMOL 750 MG PO TABS: 750.0000 mg | ORAL_TABLET | Freq: Four times a day (QID) | ORAL | 0 refills | Status: DC

## 2015-12-07 MED ORDER — IBUPROFEN 100 MG/5ML PO SUSP
600.0000 mg | Freq: Once | ORAL | Status: AC
  Administered 2015-12-07: 600 mg via ORAL
  Filled 2015-12-07: qty 30

## 2015-12-07 MED ORDER — TRAMADOL HCL 50 MG PO TABS
50.0000 mg | ORAL_TABLET | Freq: Once | ORAL | Status: AC
  Administered 2015-12-07: 50 mg via ORAL
  Filled 2015-12-07: qty 1

## 2015-12-07 MED ORDER — IBUPROFEN 600 MG PO TABS: 600.0000 mg | ORAL_TABLET | Freq: Three times a day (TID) | ORAL | 0 refills | Status: DC | PRN

## 2015-12-07 MED ORDER — METHOCARBAMOL 500 MG PO TABS
1000.0000 mg | ORAL_TABLET | Freq: Once | ORAL | Status: AC
  Administered 2015-12-07: 1000 mg via ORAL
  Filled 2015-12-07: qty 2

## 2015-12-07 MED ORDER — TRAMADOL HCL 50 MG PO TABS: 50.0000 mg | ORAL_TABLET | Freq: Four times a day (QID) | ORAL | 0 refills | Status: AC | PRN
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
## 2015-12-10 ENCOUNTER — Emergency Department
Admission: EM | Admit: 2015-12-10 | Discharge: 2015-12-10 | Disposition: A | Discharge status: Home / Self Care | Payer: No Typology Code available for payment source | Attending: Emergency Medicine | Admitting: Emergency Medicine

## 2015-12-10 ENCOUNTER — Emergency Department: Payer: No Typology Code available for payment source

## 2015-12-10 DIAGNOSIS — M542 Cervicalgia: Secondary | ICD-10-CM | POA: Diagnosis present

## 2015-12-10 DIAGNOSIS — M25512 Pain in left shoulder: Secondary | ICD-10-CM | POA: Diagnosis not present

## 2015-12-10 DIAGNOSIS — S161XXA Strain of muscle, fascia and tendon at neck level, initial encounter: Secondary | ICD-10-CM | POA: Insufficient documentation

## 2015-12-10 DIAGNOSIS — Y999 Unspecified external cause status: Secondary | ICD-10-CM | POA: Insufficient documentation

## 2015-12-10 DIAGNOSIS — E119 Type 2 diabetes mellitus without complications: Secondary | ICD-10-CM | POA: Insufficient documentation

## 2015-12-10 DIAGNOSIS — Y929 Unspecified place or not applicable: Secondary | ICD-10-CM | POA: Diagnosis not present

## 2015-12-10 DIAGNOSIS — Y939 Activity, unspecified: Secondary | ICD-10-CM | POA: Insufficient documentation

## 2015-12-10 MED ORDER — KETOROLAC TROMETHAMINE 30 MG/ML IJ SOLN
30.0000 mg | Freq: Once | INTRAMUSCULAR | Status: AC
  Administered 2015-12-10: 30 mg via INTRAMUSCULAR

## 2015-12-10 MED ORDER — KETOROLAC TROMETHAMINE 30 MG/ML IJ SOLN
30.0000 mg | Freq: Once | INTRAMUSCULAR | Status: DC
  Filled 2015-12-10: qty 1

## 2015-12-10 NOTE — Discharge Instructions (Signed)
Your x-rays tonight did not show any fractures or dislocations. Continue taking your medication as directed. Follow-up with any of the above clinics listed on your discharge papers. Call Monday to see which clinic is taking new patients.
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
## 2015-12-10 NOTE — ED Notes (Signed)
AAOx3.  Skin warm and dry.  Moving all extremities equally and strong.  NAD. Ambulates with easy and steady gait.
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
## 2016-02-07 ENCOUNTER — Emergency Department
Admission: EM | Admit: 2016-02-07 | Discharge: 2016-02-07 | Disposition: A | Discharge status: Home / Self Care | Payer: Self-pay | Attending: Emergency Medicine | Admitting: Emergency Medicine

## 2016-02-07 ENCOUNTER — Encounter: Payer: Self-pay | Admitting: Emergency Medicine

## 2016-02-07 DIAGNOSIS — H538 Other visual disturbances: Secondary | ICD-10-CM | POA: Insufficient documentation

## 2016-02-07 DIAGNOSIS — E119 Type 2 diabetes mellitus without complications: Secondary | ICD-10-CM | POA: Insufficient documentation

## 2016-02-07 DIAGNOSIS — Z791 Long term (current) use of non-steroidal anti-inflammatories (NSAID): Secondary | ICD-10-CM | POA: Insufficient documentation

## 2016-02-07 DIAGNOSIS — H5711 Ocular pain, right eye: Secondary | ICD-10-CM | POA: Insufficient documentation

## 2016-02-07 MED ORDER — FLUORESCEIN SODIUM 1 MG OP STRP
ORAL_STRIP | OPHTHALMIC | Status: DC
Start: 2016-02-07 — End: 2016-02-07
  Filled 2016-02-07: qty 1

## 2016-02-07 MED ORDER — TETRACAINE HCL 0.5 % OP SOLN
OPHTHALMIC | Status: AC
  Filled 2016-02-07: qty 2

## 2016-02-07 NOTE — ED Notes (Signed)
Pt reports right eye pain that started last Wednesday - pt denies injury to eye - pt denies drainage but reports that it feels like spikes are sticking in eye - sclera is slightly red

## 2016-02-07 NOTE — ED Notes (Signed)
VISUAL ACUITY Both eyes 20/25 Right eye 20/100 Left eye 20/25

## 2016-02-07 NOTE — ED Triage Notes (Signed)
States vision R eye blurred x 5 days. Sclera reddened. States is painful.

## 2016-02-07 NOTE — Discharge Instructions (Signed)
Patient is to see Dr. Edison Pace at [facility] after leaving ED

## 2016-02-07 NOTE — ED Provider Notes (Signed)
[facility] Emergency Department Provider Note ____________________________________________  Time seen: Approximately 11:48 AM  I have reviewed the triage vital signs and the nursing notes.   HISTORY  Chief Complaint Eye Problem   HPI Fernando Vega is a 45 y.o. male presents with 5 days of eye reddening, pain, and blurry vision. Patient went to [facility] emergency room on Saturday and was not given an answer for his eye pain. She has been using Visine, which has not helped.Patient has never had problems with this eye before. Patient does not recall getting anything in eye or any trauma to eye. Patient denies double vision, flashers, floaters. Patient denies eye discharge. Patient has had no recent illness. Patient denies allergies. Patient does not wear contacts.  Past Medical History:  Diagnosis Date  . Diabetes mellitus without complication ([place])   . Neuropathy ([place])   . Sarcoidosis ([place])   . Sleep apnea     There are no active problems to display for this patient.   History reviewed. No pertinent surgical history.  Prior to Admission medications   Medication Sig Start Date End Date Taking? Authorizing Provider  ibuprofen (ADVIL,MOTRIN) 600 MG tablet Take 1 tablet (600 mg total) by mouth every 8 (eight) hours as needed. 12/07/15   Sable Feil, PA-C  methocarbamol (ROBAXIN-750) 750 MG tablet Take 1 tablet (750 mg total) by mouth 4 (four) times daily. 12/07/15   Sable Feil, PA-C  traMADol (ULTRAM) 50 MG tablet Take 1 tablet (50 mg total) by mouth every 6 (six) hours as needed. 12/07/15 12/06/16  Sable Feil, PA-C    Allergies Patient has no known allergies.  No family history on file.  Social History Social History  Substance Use Topics  . Smoking status: Never Smoker  . Smokeless tobacco: Not on file  . Alcohol use No    Review of Systems   Constitutional: No fever/chills Eyes: Positive for blurry vision. Positive for pain.  Negative for double vision, flashers or floaters. Musculoskeletal: Negative for pain. Skin: Negative for rash. Neurological: Negative for headaches Allergic: Negative for seasonal allergies. ____________________________________________  PHYSICAL EXAM:  VITAL SIGNS: ED Triage Vitals  Enc Vitals Group     BP 02/07/16 1117 112/74     Pulse Rate 02/07/16 1117 96     Resp 02/07/16 1117 20     Temp 02/07/16 1117 98 F (36.7 C)     Temp Source 02/07/16 1117 Oral     SpO2 02/07/16 1117 96 %     Weight 02/07/16 1118 187 lb (84.8 kg)     Height 02/07/16 1118 5\' 7"  (1.702 m)     Head Circumference --      Peak Flow --      Pain Score 02/07/16 1118 10     Pain Loc --      Pain Edu? --      Excl. in [place]? --     Constitutional: Alert and oriented. Well appearing and in no acute distress. Eyes: Visual acuity--see nursing documentation; No globe trauma; Eyelids normal to inspection. Eyelids were inverted. Conjunctiva appears injected. No corneal abrasion noted. Right pupil irregularly shaped, unsure if this is baseline. Pupils equally reactive to light. EOMI.  Head: Atraumatic. Nose: No congestion/rhinnorhea. Mouth/Throat: Mucous membranes are moist.  Oropharynx non-erythematous. Respiratory: Normal respiratory effort. Musculoskeletal:Normal ROM x 4 extremities. Neurologic:  Normal speech and language. No gross focal neurologic deficits are appreciated. Speech is normal. No gait instability. Skin:  Skin is warm,  dry and intact. No rash noted. Psychiatric: Mood and affect are normal. Speech and behavior are normal.  ____________________________________________   LABS (all labs ordered are listed, but only abnormal results are displayed)  Labs Reviewed - No data to display ____________________________________________  EKG   RADIOLOGY   PROCEDURES   INITIAL IMPRESSION / ASSESSMENT AND PLAN / ED COURSE  Pertinent labs & imaging results that were available during my care of the  patient were reviewed by me and considered in my medical decision making (see chart for details).  Clinical Course    Patient has decreased visual acuity in right eye, redness, and pain. Patient has irregularly shaped right pupil, which patient was not sure whether this is baseline. Extraocular motions intact, pupils reactive bilaterally, and no corneal abrasion was seen on exam.   Tenotometer was not working. Due to pain and decreased visual acuity, Dr. Edison Pace was consulted that about patient and agreed to see the patient this afternoon. Patient was instructed to see Dr. Edison Pace at [facility] this afternoon as soon as leaving the Ed. Patient agreed to go immediately to [facility].  _______________________________________   FINAL CLINICAL IMPRESSION(S) / ED DIAGNOSES  Final diagnoses:  Acute right eye pain    Note:  This document was prepared using Dragon voice recognition software and may include unintentional dictation errors.   Laban Emperor, PA-C 02/07/16 1945    Lavonia Drafts, MD 02/07/16 2018

## 2016-02-08 ENCOUNTER — Other Ambulatory Visit
Admission: RE | Admit: 2016-02-08 | Discharge: 2016-02-08 | Disposition: A | Discharge status: Home / Self Care | Payer: Self-pay | Source: Ambulatory Visit | Attending: Ophthalmology | Admitting: Ophthalmology

## 2016-02-08 DIAGNOSIS — Z114 Encounter for screening for human immunodeficiency virus [HIV]: Secondary | ICD-10-CM | POA: Insufficient documentation

## 2016-02-08 LAB — CBC WITH DIFFERENTIAL/PLATELET
BASOS ABS: 0.1 10*3/uL (ref 0–0.1)
BASOS PCT: 1 %
Eosinophils Absolute: 0.2 10*3/uL (ref 0–0.7)
Eosinophils Relative: 2 %
HEMATOCRIT: 45.1 % (ref 40.0–52.0)
HEMOGLOBIN: 15.5 g/dL (ref 13.0–18.0)
Lymphocytes Relative: 41 %
Lymphs Abs: 3.3 10*3/uL (ref 1.0–3.6)
MCH: 30.8 pg (ref 26.0–34.0)
MCHC: 34.3 g/dL (ref 32.0–36.0)
MCV: 89.9 fL (ref 80.0–100.0)
MONOS PCT: 10 %
Monocytes Absolute: 0.8 10*3/uL (ref 0.2–1.0)
NEUTROS ABS: 3.7 10*3/uL (ref 1.4–6.5)
NEUTROS PCT: 46 %
Platelets: 240 10*3/uL (ref 150–440)
RBC: 5.01 MIL/uL (ref 4.40–5.90)
RDW: 12.5 % (ref 11.5–14.5)
WBC: 8.1 10*3/uL (ref 3.8–10.6)

## 2016-02-09 LAB — HIV ANTIBODY (ROUTINE TESTING W REFLEX): HIV SCREEN 4TH GENERATION: NONREACTIVE

## 2016-02-15 LAB — MISC LABCORP TEST (SEND OUT)
LABCORP TEST CODE: 6924
LabCorp test name: 27

## 2017-09-25 ENCOUNTER — Emergency Department
Admission: EM | Admit: 2017-09-25 | Discharge: 2017-09-25 | Disposition: A | Discharge status: Home / Self Care | Payer: Self-pay | Attending: Emergency Medicine | Admitting: Emergency Medicine

## 2017-09-25 ENCOUNTER — Emergency Department: Payer: Self-pay

## 2017-09-25 ENCOUNTER — Other Ambulatory Visit: Payer: Self-pay

## 2017-09-25 ENCOUNTER — Encounter: Payer: Self-pay | Admitting: Emergency Medicine

## 2017-09-25 DIAGNOSIS — E1165 Type 2 diabetes mellitus with hyperglycemia: Secondary | ICD-10-CM | POA: Insufficient documentation

## 2017-09-25 DIAGNOSIS — R739 Hyperglycemia, unspecified: Secondary | ICD-10-CM

## 2017-09-25 DIAGNOSIS — E1140 Type 2 diabetes mellitus with diabetic neuropathy, unspecified: Secondary | ICD-10-CM | POA: Insufficient documentation

## 2017-09-25 DIAGNOSIS — N3000 Acute cystitis without hematuria: Secondary | ICD-10-CM

## 2017-09-25 LAB — BASIC METABOLIC PANEL
ANION GAP: 9 (ref 5–15)
BUN: 9 mg/dL (ref 6–20)
CO2: 28 mmol/L (ref 22–32)
Calcium: 8.5 mg/dL — ABNORMAL LOW (ref 8.9–10.3)
Chloride: 96 mmol/L — ABNORMAL LOW (ref 98–111)
Creatinine, Ser: 0.75 mg/dL (ref 0.61–1.24)
GFR calc Af Amer: >60 mL/min (ref >60)
GLUCOSE: 417 mg/dL — AB (ref 70–99)
POTASSIUM: 4.2 mmol/L (ref 3.5–5.1)
Sodium: 133 mmol/L — ABNORMAL LOW (ref 135–145)

## 2017-09-25 LAB — CBC
HCT: 42.2 % (ref 40.0–52.0)
Hemoglobin: 14.4 g/dL (ref 13.0–18.0)
MCH: 31.3 pg (ref 26.0–34.0)
MCHC: 34.1 g/dL (ref 32.0–36.0)
MCV: 91.6 fL (ref 80.0–100.0)
PLATELETS: 201 10*3/uL (ref 150–440)
RBC: 4.61 MIL/uL (ref 4.40–5.90)
RDW: 12.1 % (ref 11.5–14.5)
WBC: 14 10*3/uL — ABNORMAL HIGH (ref 3.8–10.6)

## 2017-09-25 LAB — URINALYSIS, COMPLETE (UACMP) WITH MICROSCOPIC
Bilirubin Urine: NEGATIVE
Glucose, UA: >=500 mg/dL — AB
Ketones, ur: NEGATIVE mg/dL
NITRITE: POSITIVE — AB
PH: 6 (ref 5.0–8.0)
PROTEIN: NEGATIVE mg/dL
SPECIFIC GRAVITY, URINE: 1.028 (ref 1.005–1.030)

## 2017-09-25 MED ORDER — SODIUM CHLORIDE 0.9 % IV SOLN
1.0000 g | Freq: Once | INTRAVENOUS | Status: AC
  Administered 2017-09-25: 1 g via INTRAVENOUS
  Filled 2017-09-25: qty 10

## 2017-09-25 MED ORDER — CIPROFLOXACIN HCL 500 MG PO TABS: 500.0000 mg | ORAL_TABLET | Freq: Two times a day (BID) | ORAL | 0 refills | Status: AC

## 2017-09-25 MED ORDER — PHENAZOPYRIDINE HCL 200 MG PO TABS: 200.0000 mg | ORAL_TABLET | Freq: Three times a day (TID) | ORAL | 0 refills | Status: DC | PRN

## 2017-09-25 MED ORDER — SODIUM CHLORIDE 0.9 % IV SOLN
Freq: Once | INTRAVENOUS | Status: AC
  Administered 2017-09-25: 12:00:00 via INTRAVENOUS

## 2017-09-25 MED ORDER — GLIPIZIDE 5 MG PO TABS: 5.0000 mg | ORAL_TABLET | Freq: Two times a day (BID) | ORAL | 11 refills | Status: DC

## 2017-09-25 NOTE — ED Provider Notes (Signed)
[facility] Emergency Department Provider Note       Time seen: ----------------------------------------- 11:15 AM on 09/25/2017 -----------------------------------------   I have reviewed the triage vital signs and the nursing notes.  HISTORY   Chief Complaint Urinary Retention    HPI Fernando Vega is a 47 y.o. male with a history of diabetes, neuropathy, sarcoidosis, obstructive sleep apnea who presents to the ED for bilateral flank pain as well as lower abdominal pain for the past 2 days with feelings of urinary retention.  He is complaining of dribbling urine for 2 days with pain in his kidneys.  He denies any previous history of same.  Patient states he has not checked his blood sugar in several years and has not had medications for blood sugar even longer than that.  Past Medical History:  Diagnosis Date  . Diabetes mellitus without complication ([place])   . Neuropathy   . Sarcoidosis   . Sleep apnea     There are no active problems to display for this patient.   History reviewed. No pertinent surgical history.  Allergies Patient has no known allergies.  Social History Social History   Tobacco Use  . Smoking status: Never Smoker  . Smokeless tobacco: Never Used  Substance Use Topics  . Alcohol use: No  . Drug use: No   Review of Systems Constitutional: Negative for fever. Cardiovascular: Negative for chest pain. Respiratory: Negative for shortness of breath. Gastrointestinal: Positive for abdominal pain Genitourinary: Positive for urinary hesitancy and retention Musculoskeletal: Positive for back pain Skin: Negative for rash. Neurological: Negative for headaches, focal weakness or numbness.  All systems negative/normal/unremarkable except as stated in the HPI  ____________________________________________   PHYSICAL EXAM:  VITAL SIGNS: ED Triage Vitals  Enc Vitals Group     BP 09/25/17 1021 140/78     Pulse Rate 09/25/17  1021 94     Resp 09/25/17 1021 20     Temp 09/25/17 1021 98 F (36.7 C)     Temp src --      SpO2 09/25/17 1021 98 %     Weight 09/25/17 1020 190 lb (86.2 kg)     Height 09/25/17 1020 5\' 7"  (1.702 m)     Head Circumference --      Peak Flow --      Pain Score 09/25/17 1019 5     Pain Loc --      Pain Edu? --      Excl. in [place]? --    Constitutional: Alert and oriented.  Mild distress Eyes: Conjunctivae are normal. Normal extraocular movements. ENT   Head: Normocephalic and atraumatic.   Nose: No congestion/rhinnorhea.   Mouth/Throat: Mucous membranes are moist.   Neck: No stridor. Cardiovascular: Normal rate, regular rhythm. No murmurs, rubs, or gallops. Respiratory: Normal respiratory effort without tachypnea nor retractions. Breath sounds are clear and equal bilaterally. No wheezes/rales/rhonchi. Gastrointestinal: Suprapubic tenderness, normal bowel sounds. Musculoskeletal: Bilateral CVA tenderness, otherwise unremarkable Neurologic:  Normal speech and language. No gross focal neurologic deficits are appreciated.  Skin:  Skin is warm, dry and intact. No rash noted. Psychiatric: Mood and affect are normal. Speech and behavior are normal.  ____________________________________________  ED COURSE:  As part of my medical decision making, I reviewed the following data within the [place] History obtained from family if available, nursing notes, old chart and ekg, as well as notes from prior ED visits. Patient presented for back pain, abdominal pain and urinary retention, we  will assess with labs and imaging as indicated at this time.   Procedures ____________________________________________   LABS (pertinent positives/negatives)  Labs Reviewed  URINALYSIS, COMPLETE (UACMP) WITH MICROSCOPIC - Abnormal; Notable for the following components:      Result Value   Color, Urine YELLOW (*)    APPearance HAZY (*)    Glucose, UA >=500 (*)    Hgb urine  dipstick SMALL (*)    Nitrite POSITIVE (*)    Leukocytes, UA TRACE (*)    Bacteria, UA FEW (*)    All other components within normal limits  BASIC METABOLIC PANEL - Abnormal; Notable for the following components:   Sodium 133 (*)    Chloride 96 (*)    Glucose, Bld 417 (*)    Calcium 8.5 (*)    All other components within normal limits  CBC - Abnormal; Notable for the following components:   WBC 14.0 (*)    All other components within normal limits  URINE CULTURE    RADIOLOGY Images were viewed by me CT renal protocol IMPRESSION: 1. No explanation for the patient's abdominal pain is seen. No renal or ureteral calculi are noted. 2. The appendix and terminal ileum are unremarkable. No inflammatory process is seen. 3. Somewhat thick-walled but nondistended urinary bladder with air bubbles within the urinary bladder. This may indicate recent instrumentation, but a fistulous communication cannot be excluded. Correlate clinically. 4. Calcified vas deferens bilaterally. 5. Bilateral pars defects at L5.  ____________________________________________  DIFFERENTIAL DIAGNOSIS   BPH, UTI, pyelonephritis, renal colic  FINAL ASSESSMENT AND PLAN  Urinary tract infection, hyperglycemia   Plan: The patient had presented for acute urinary retention. Patient's labs did indicate urinary tract infection and likely long-standing untreated diabetes. Patient's imaging did not reveal any acute process other than a thickened urinary bladder with some air bubbles in the urinary bladder.  We did start treatment with IV Rocephin here and have sent a urine culture.  Patient looks well clinically and is stable for outpatient follow-up.  I would advise follow-up in 2 days if no better.   Laurence Aly, MD   Note: This note was generated in part or whole with voice recognition software. Voice recognition is usually quite accurate but there are transcription errors that can and very often do  occur. I apologize for any typographical errors that were not detected and corrected.     Earleen Newport, MD 09/25/17 1331

## 2017-09-25 NOTE — ED Notes (Signed)
Patient transported to CT 

## 2017-09-25 NOTE — ED Notes (Signed)
First Nurse Note: Patient complaining of "dribbling" urine X 2 days.  Complaining of abdominal pain and "in my kidneys".  Denies previous hx of same.

## 2017-09-25 NOTE — ED Triage Notes (Signed)
Presents with bilateral flank pain and lower abd pain for about 2 days   Feels like he is not able to empty bladder

## 2017-09-25 NOTE — ED Notes (Signed)
Pt given urinal to attempt to empty his bladder. Educated pt about UTI and importance of blood sugar regulation. Pt understanding and appreciative of education. Call bell within reach, will continue to monitor.

## 2017-09-25 NOTE — ED Notes (Signed)
Pt was able to urinate 450mL on his own, MD Jimmye Norman made aware.

## 2017-09-28 LAB — URINE CULTURE: SPECIAL REQUESTS: NORMAL

## 2017-10-11 ENCOUNTER — Emergency Department
Admission: EM | Admit: 2017-10-11 | Discharge: 2017-10-11 | Disposition: A | Discharge status: Home / Self Care | Payer: No Typology Code available for payment source | Attending: Emergency Medicine | Admitting: Emergency Medicine

## 2017-10-11 ENCOUNTER — Other Ambulatory Visit: Payer: Self-pay

## 2017-10-11 ENCOUNTER — Emergency Department: Payer: No Typology Code available for payment source

## 2017-10-11 ENCOUNTER — Encounter: Payer: Self-pay | Admitting: Emergency Medicine

## 2017-10-11 DIAGNOSIS — Z7984 Long term (current) use of oral hypoglycemic drugs: Secondary | ICD-10-CM | POA: Diagnosis not present

## 2017-10-11 DIAGNOSIS — M545 Low back pain, unspecified: Secondary | ICD-10-CM

## 2017-10-11 DIAGNOSIS — S161XXA Strain of muscle, fascia and tendon at neck level, initial encounter: Secondary | ICD-10-CM | POA: Diagnosis not present

## 2017-10-11 DIAGNOSIS — Y998 Other external cause status: Secondary | ICD-10-CM | POA: Insufficient documentation

## 2017-10-11 DIAGNOSIS — Y92410 Unspecified street and highway as the place of occurrence of the external cause: Secondary | ICD-10-CM | POA: Insufficient documentation

## 2017-10-11 DIAGNOSIS — S199XXA Unspecified injury of neck, initial encounter: Secondary | ICD-10-CM | POA: Diagnosis present

## 2017-10-11 DIAGNOSIS — Y9389 Activity, other specified: Secondary | ICD-10-CM | POA: Insufficient documentation

## 2017-10-11 DIAGNOSIS — E119 Type 2 diabetes mellitus without complications: Secondary | ICD-10-CM | POA: Insufficient documentation

## 2017-10-11 MED ORDER — IBUPROFEN 600 MG PO TABS: 600.0000 mg | ORAL_TABLET | Freq: Three times a day (TID) | ORAL | 0 refills | Status: DC | PRN

## 2017-10-11 MED ORDER — METHOCARBAMOL 750 MG PO TABS: 750.0000 mg | ORAL_TABLET | Freq: Four times a day (QID) | ORAL | 0 refills | Status: DC | PRN
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
## 2017-10-11 NOTE — Discharge Instructions (Signed)
Call the orthopedic department at [facility] for a follow-up appointment.  Begin taking medication only as directed.  Ibuprofen 600 mg 3 times daily with food.  Robaxin is every 6 hours for muscle spasms if needed.  Do not take this medication while driving or operating machinery.  This medication could cause drowsiness and increase your risk for injury.  You may also use ice or heat to your back and neck as needed for discomfort. Your medication was sent to [facility] on [address].
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
## 2018-02-10 ENCOUNTER — Encounter: Payer: Self-pay | Admitting: Emergency Medicine

## 2018-02-10 ENCOUNTER — Emergency Department: Payer: Self-pay

## 2018-02-10 ENCOUNTER — Other Ambulatory Visit: Payer: Self-pay

## 2018-02-10 ENCOUNTER — Emergency Department
Admission: EM | Admit: 2018-02-10 | Discharge: 2018-02-10 | Disposition: A | Discharge status: Home / Self Care | Payer: Self-pay | Attending: Emergency Medicine | Admitting: Emergency Medicine

## 2018-02-10 DIAGNOSIS — R079 Chest pain, unspecified: Secondary | ICD-10-CM

## 2018-02-10 DIAGNOSIS — E119 Type 2 diabetes mellitus without complications: Secondary | ICD-10-CM | POA: Insufficient documentation

## 2018-02-10 DIAGNOSIS — R0602 Shortness of breath: Secondary | ICD-10-CM | POA: Insufficient documentation

## 2018-02-10 DIAGNOSIS — R0789 Other chest pain: Secondary | ICD-10-CM | POA: Insufficient documentation

## 2018-02-10 DIAGNOSIS — Z7984 Long term (current) use of oral hypoglycemic drugs: Secondary | ICD-10-CM | POA: Insufficient documentation

## 2018-02-10 LAB — BASIC METABOLIC PANEL
Anion gap: 12 (ref 5–15)
BUN: 16 mg/dL (ref 6–20)
CHLORIDE: 97 mmol/L — AB (ref 98–111)
CO2: 25 mmol/L (ref 22–32)
Calcium: 9 mg/dL (ref 8.9–10.3)
Creatinine, Ser: 0.67 mg/dL (ref 0.61–1.24)
GFR calc Af Amer: >60 mL/min (ref >60)
GFR calc non Af Amer: >60 mL/min (ref >60)
GLUCOSE: 390 mg/dL — AB (ref 70–99)
POTASSIUM: 4 mmol/L (ref 3.5–5.1)
Sodium: 134 mmol/L — ABNORMAL LOW (ref 135–145)

## 2018-02-10 LAB — CBC
HCT: 45.2 % (ref 39.0–52.0)
Hemoglobin: 15.3 g/dL (ref 13.0–17.0)
MCH: 30.6 pg (ref 26.0–34.0)
MCHC: 33.8 g/dL (ref 30.0–36.0)
MCV: 90.4 fL (ref 80.0–100.0)
NRBC: 0 % (ref 0.0–0.2)
Platelets: 278 10*3/uL (ref 150–400)
RBC: 5 MIL/uL (ref 4.22–5.81)
RDW: 11.7 % (ref 11.5–15.5)
WBC: 8 10*3/uL (ref 4.0–10.5)

## 2018-02-10 LAB — TROPONIN I: Troponin I: <0.03 ng/mL (ref <0.03)

## 2018-02-10 MED ORDER — IOHEXOL 350 MG/ML SOLN
75.0000 mL | Freq: Once | INTRAVENOUS | Status: AC | PRN
  Administered 2018-02-10: 75 mL via INTRAVENOUS

## 2018-02-10 NOTE — ED Triage Notes (Signed)
PT arrived with complaints of sharp/stabbing pain that started last night. Pt states the pain last night was severe; causing him to be nauseated, lightheaded, and short of breath. Pt states that pain episode lasted 15 minutes and since has been mild. Pt states the pain is in his left and right chest and still reports it as a sharp/stabbing sensation but with less intensity.

## 2018-02-10 NOTE — ED Provider Notes (Signed)
[facility] Emergency Department Provider Note   ____________________________________________    I have reviewed the triage vital signs and the nursing notes.   HISTORY  Chief Complaint Chest Pain     HPI Fernando Vega is a 47 y.o. male with a history of diabetes who presents today with chest pain and some shortness of breath.  Patient reports at approximately 10:30 PM last night he woke up with a sharp central chest pain which she describes as moderate to severe which made him take out in a sweat become clammy and nauseated.  He reports the symptoms lasted about 15 to 20 minutes and then he started to feel little better.  Today he feels improved although he does report shortness of breath with any exertion.  Denies recent travel.  No calf pain or swelling.  No history of blood clots.  No history of heart attacks.  Does have a history of CAD in his family.  He does have diabetes and sarcoidosis but does not take anything for sarcoidosis.   Past Medical History:  Diagnosis Date  . Diabetes mellitus without complication ([place])   . Neuropathy   . Sarcoidosis   . Sleep apnea     There are no active problems to display for this patient.   History reviewed. No pertinent surgical history.  Prior to Admission medications   Medication Sig Start Date End Date Taking? Authorizing Provider  glipiZIDE (GLUCOTROL) 5 MG tablet Take 1 tablet (5 mg total) by mouth 2 (two) times daily. 09/25/17 09/25/18  Earleen Newport, MD  ibuprofen (ADVIL,MOTRIN) 600 MG tablet Take 1 tablet (600 mg total) by mouth every 8 (eight) hours as needed. 10/11/17   [name], PA-C  methocarbamol (ROBAXIN-750) 750 MG tablet Take 1 tablet (750 mg total) by mouth every 6 (six) hours as needed for muscle spasms. 10/11/17   [name], PA-C     Allergies Patient has no known allergies.  No family history on file.  Social History Social History   Tobacco Use  . Smoking  status: Never Smoker  . Smokeless tobacco: Never Used  Substance Use Topics  . Alcohol use: No  . Drug use: No    Review of Systems  Constitutional: No fever/chills Eyes: No visual changes.  ENT: No sore throat. Cardiovascular: As above Respiratory: As above Gastrointestinal: No abdominal pain.  Genitourinary: Negative for dysuria. Musculoskeletal: Negative for leg pain Skin: Negative for rash. Neurological: Negative for headaches    ____________________________________________   PHYSICAL EXAM:  VITAL SIGNS: ED Triage Vitals  Enc Vitals Group     BP 02/10/18 1151 139/83     Pulse Rate 02/10/18 1151 93     Resp 02/10/18 1151 18     Temp 02/10/18 1151 97.6 F (36.4 C)     Temp Source 02/10/18 1151 Oral     SpO2 02/10/18 1151 96 %     Weight 02/10/18 1155 86.6 kg (191 lb)     Height 02/10/18 1155 1.702 m (5\' 7" )     Head Circumference --      Peak Flow --      Pain Score 02/10/18 1154 5     Pain Loc --      Pain Edu? --      Excl. in [place]? --     Constitutional: Alert and oriented.  Eyes: Conjunctivae are normal.   Nose: No congestion/rhinnorhea. Mouth/Throat: Mucous membranes are moist.    Cardiovascular: Normal rate,  regular rhythm. Grossly normal heart sounds.  Good peripheral circulation. Respiratory: Normal respiratory effort.  No retractions. Lungs CTAB. Gastrointestinal: Soft and nontender. No distention.    Musculoskeletal: No lower extremity tenderness nor edema.  Warm and well perfused Neurologic:  Normal speech and language. No gross focal neurologic deficits are appreciated.  Skin:  Skin is warm, dry and intact. No rash noted. Psychiatric: Mood and affect are normal. Speech and behavior are normal.  ____________________________________________   LABS (all labs ordered are listed, but only abnormal results are displayed)  Labs Reviewed  BASIC METABOLIC PANEL - Abnormal; Notable for the following components:      Result Value   Sodium 134 (*)     Chloride 97 (*)    Glucose, Bld 390 (*)    All other components within normal limits  CBC  TROPONIN I   ____________________________________________  EKG  ED ECG REPORT I, Lavonia Drafts, the attending physician, personally viewed and interpreted this ECG.  Date: 02/10/2018  Rhythm: normal sinus rhythm QRS Axis: normal Intervals: normal ST/T Wave abnormalities: normal Narrative Interpretation: no evidence of acute ischemia  ____________________________________________  RADIOLOGY  Chest x-ray ____________________________________________   PROCEDURES  Procedure(s) performed: No  Procedures   Critical Care performed: No ____________________________________________   INITIAL IMPRESSION / ASSESSMENT AND PLAN / ED COURSE  Pertinent labs & imaging results that were available during my care of the patient were reviewed by me and considered in my medical decision making (see chart for details).  Patient overall well-appearing in no acute distress.  Chest pain symptoms appear to have improved overnight, continues to have mild shortness of breath with exertion.  EKG is reassuring, troponin is normal.  Will send for CT angiography to rule out PE given abrupt onset of chest pain shortness of breath  CT angiography is negative for PE.  Patient remains asymptomatic and feels well.  He would like to go home, I feel this is reasonable with close cardiology follow-up.  Return precautions discussed    ____________________________________________   FINAL CLINICAL IMPRESSION(S) / ED DIAGNOSES  Final diagnoses:  Chest pain, unspecified type        Note:  This document was prepared using Dragon voice recognition software and may include unintentional dictation errors.    Lavonia Drafts, MD 02/10/18 [phone]

## 2018-02-10 NOTE — ED Notes (Signed)
CT angio completed awaiting results. Patient currently reports no pain /no sob. Will continue to monitor.

## 2018-02-10 NOTE — ED Notes (Signed)
IV hep lock placed to rt forearm, patient awaiting CT angio

## 2018-04-08 ENCOUNTER — Other Ambulatory Visit: Payer: Self-pay

## 2018-04-08 ENCOUNTER — Encounter: Payer: Self-pay | Admitting: Emergency Medicine

## 2018-04-08 ENCOUNTER — Emergency Department
Admission: EM | Admit: 2018-04-08 | Discharge: 2018-04-08 | Disposition: A | Discharge status: Home / Self Care | Payer: Self-pay | Attending: Student in an Organized Health Care Education/Training Program | Admitting: Student in an Organized Health Care Education/Training Program

## 2018-04-08 DIAGNOSIS — Y92410 Unspecified street and highway as the place of occurrence of the external cause: Secondary | ICD-10-CM | POA: Insufficient documentation

## 2018-04-08 DIAGNOSIS — Y9389 Activity, other specified: Secondary | ICD-10-CM | POA: Insufficient documentation

## 2018-04-08 DIAGNOSIS — S0502XA Injury of conjunctiva and corneal abrasion without foreign body, left eye, initial encounter: Secondary | ICD-10-CM

## 2018-04-08 DIAGNOSIS — Z79899 Other long term (current) drug therapy: Secondary | ICD-10-CM | POA: Insufficient documentation

## 2018-04-08 DIAGNOSIS — Y999 Unspecified external cause status: Secondary | ICD-10-CM | POA: Insufficient documentation

## 2018-04-08 DIAGNOSIS — S0501XA Injury of conjunctiva and corneal abrasion without foreign body, right eye, initial encounter: Secondary | ICD-10-CM | POA: Insufficient documentation

## 2018-04-08 DIAGNOSIS — E119 Type 2 diabetes mellitus without complications: Secondary | ICD-10-CM | POA: Insufficient documentation

## 2018-04-08 DIAGNOSIS — W458XXA Other foreign body or object entering through skin, initial encounter: Secondary | ICD-10-CM | POA: Insufficient documentation

## 2018-04-08 MED ORDER — TETRACAINE HCL 0.5 % OP SOLN
2.0000 [drp] | Freq: Once | OPHTHALMIC | Status: AC
  Administered 2018-04-08: 2 [drp] via OPHTHALMIC
  Filled 2018-04-08: qty 4

## 2018-04-08 MED ORDER — TOBRAMYCIN 0.3 % OP SOLN: 1.0000 [drp] | OPHTHALMIC | 0 refills | Status: AC

## 2018-04-08 MED ORDER — EYE WASH OPHTH SOLN
1.0000 [drp] | OPHTHALMIC | Status: DC | PRN
  Filled 2018-04-08: qty 118

## 2018-04-08 MED ORDER — FLUORESCEIN SODIUM 1 MG OP STRP
1.0000 | ORAL_STRIP | Freq: Once | OPHTHALMIC | Status: AC
  Administered 2018-04-08: 1 via OPHTHALMIC
  Filled 2018-04-08: qty 1

## 2018-04-08 NOTE — ED Triage Notes (Signed)
States thinks got something in R eye 2 days ago, irritated since, sclera reddened.
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
## 2018-04-08 NOTE — ED Provider Notes (Signed)
[facility] Emergency Department Provider Note   ____________________________________________   First MD Initiated Contact with Patient 04/08/18 1428     (approximate)  I have reviewed the triage vital signs and the nursing notes.   HISTORY  Chief Complaint Eye Problem    HPI Fernando Vega is a 48 y.o. male patient presents with foreign body sensation right 2 days.  Patient denies vision disturbance.  Patient denies discharge.  Patient did not wear contact lenses.  Patient describes his pain ias "achy".   Rates the pain as a 7/10.  No palliative measure for complaint.   Past Medical History:  Diagnosis Date  . Diabetes mellitus without complication ([place])   . Neuropathy   . Sarcoidosis   . Sleep apnea     There are no active problems to display for this patient.   History reviewed. No pertinent surgical history.  Prior to Admission medications   Medication Sig Start Date End Date Taking? Authorizing Provider  glipiZIDE (GLUCOTROL) 5 MG tablet Take 1 tablet (5 mg total) by mouth 2 (two) times daily. 09/25/17 09/25/18  Earleen Newport, MD  ibuprofen (ADVIL,MOTRIN) 600 MG tablet Take 1 tablet (600 mg total) by mouth every 8 (eight) hours as needed. 10/11/17   [name], PA-C  methocarbamol (ROBAXIN-750) 750 MG tablet Take 1 tablet (750 mg total) by mouth every 6 (six) hours as needed for muscle spasms. 10/11/17   [name], PA-C  tobramycin (TOBREX) 0.3 % ophthalmic solution Place 1 drop into the right eye every 4 (four) hours for 10 days. 04/08/18 04/18/18  Sable Feil, PA-C    Allergies Patient has no known allergies.  No family history on file.  Social History Social History   Tobacco Use  . Smoking status: Never Smoker  . Smokeless tobacco: Never Used  Substance Use Topics  . Alcohol use: No  . Drug use: No    Review of Systems Constitutional: No fever/chills Eyes: No visual changes.  Right eye pain. ENT: No sore  throat. Cardiovascular: Denies chest pain. Respiratory: Denies shortness of breath. Gastrointestinal: No abdominal pain.  No nausea, no vomiting.  No diarrhea.  No constipation. Genitourinary: Negative for dysuria. Musculoskeletal: Negative for back pain. Skin: Negative for rash. Neurological: Negative for headaches, focal weakness or numbness. Endocrine:  Diabetes   ____________________________________________   PHYSICAL EXAM:  VITAL SIGNS: ED Triage Vitals [04/08/18 1409]  Enc Vitals Group     BP 139/84     Pulse Rate 93     Resp 20     Temp 98 F (36.7 C)     Temp Source Oral     SpO2 95 %     Weight 189 lb (85.7 kg)     Height 5\' 7"  (1.702 m)     Head Circumference      Peak Flow      Pain Score 7     Pain Loc      Pain Edu?      Excl. in [place]?    Constitutional: Alert and oriented. Well appearing and in no acute distress. Eyes: Right conjunctiva is erythematous.  L. PERRL. EOMI. fluoroscopy stain reveal corneal abrasion.  No visible foreign body. Cardiovascular: Normal rate, regular rhythm. Grossly normal heart sounds.  Good peripheral circulation. Respiratory: Normal respiratory effort.  No retractions. Lungs CTAB. Gastrointestinal: Soft and nontender. No  Skin:  Skin is warm, dry and intact. No rash noted. Psychiatric: Mood and affect are normal.  Speech and behavior are normal.  ____________________________________________   LABS (all labs ordered are listed, but only abnormal results are displayed)  Labs Reviewed - No data to display ____________________________________________  EKG   ____________________________________________  RADIOLOGY  ED MD interpretation:    Official radiology report(s): No results found.  ____________________________________________   PROCEDURES  Procedure(s) performed: None  Procedures  Critical Care performed: No  ____________________________________________   INITIAL IMPRESSION / ASSESSMENT AND PLAN / ED  COURSE  As part of my medical decision making, I reviewed the following data within the [place]     Right pain secondary corneal abrasion.  Patient given discharge care instruction advised follow-up with the ophthalmologist if no improvement in 3 to 4 days.      ____________________________________________   FINAL CLINICAL IMPRESSION(S) / ED DIAGNOSES  Final diagnoses:  Abrasion of left cornea, initial encounter     ED Discharge Orders         Ordered    tobramycin (TOBREX) 0.3 % ophthalmic solution  Every 4 hours     04/08/18 1507           Note:  This document was prepared using Dragon voice recognition software and may include unintentional dictation errors.    Sable Feil, PA-C 04/08/18 1537    Merlyn Lot, MD 04/08/18 [phone]

## 2018-05-06 ENCOUNTER — Emergency Department
Admission: EM | Admit: 2018-05-06 | Discharge: 2018-05-07 | Disposition: A | Discharge status: Home / Self Care | Payer: Self-pay | Attending: Emergency Medicine | Admitting: Emergency Medicine

## 2018-05-06 ENCOUNTER — Emergency Department: Payer: Self-pay

## 2018-05-06 ENCOUNTER — Encounter: Payer: Self-pay | Admitting: Emergency Medicine

## 2018-05-06 ENCOUNTER — Other Ambulatory Visit: Payer: Self-pay

## 2018-05-06 DIAGNOSIS — J40 Bronchitis, not specified as acute or chronic: Secondary | ICD-10-CM | POA: Insufficient documentation

## 2018-05-06 DIAGNOSIS — Z7984 Long term (current) use of oral hypoglycemic drugs: Secondary | ICD-10-CM | POA: Insufficient documentation

## 2018-05-06 DIAGNOSIS — E119 Type 2 diabetes mellitus without complications: Secondary | ICD-10-CM | POA: Insufficient documentation

## 2018-05-06 DIAGNOSIS — R079 Chest pain, unspecified: Secondary | ICD-10-CM

## 2018-05-06 LAB — CBC
HCT: 48.3 % (ref 39.0–52.0)
Hemoglobin: 16.5 g/dL (ref 13.0–17.0)
MCH: 30.9 pg (ref 26.0–34.0)
MCHC: 34.2 g/dL (ref 30.0–36.0)
MCV: 90.4 fL (ref 80.0–100.0)
Platelets: 234 10*3/uL (ref 150–400)
RBC: 5.34 MIL/uL (ref 4.22–5.81)
RDW: 11.9 % (ref 11.5–15.5)
WBC: 9.5 10*3/uL (ref 4.0–10.5)
nRBC: 0 % (ref 0.0–0.2)

## 2018-05-06 LAB — TROPONIN I: Troponin I: <0.03 ng/mL (ref <0.03)

## 2018-05-06 LAB — BASIC METABOLIC PANEL
Anion gap: 8 (ref 5–15)
BUN: 18 mg/dL (ref 6–20)
CO2: 26 mmol/L (ref 22–32)
CREATININE: 0.71 mg/dL (ref 0.61–1.24)
Calcium: 8.6 mg/dL — ABNORMAL LOW (ref 8.9–10.3)
Chloride: 99 mmol/L (ref 98–111)
GFR calc Af Amer: >60 mL/min (ref >60)
GFR calc non Af Amer: >60 mL/min (ref >60)
Glucose, Bld: 297 mg/dL — ABNORMAL HIGH (ref 70–99)
Potassium: 3.9 mmol/L (ref 3.5–5.1)
Sodium: 133 mmol/L — ABNORMAL LOW (ref 135–145)

## 2018-05-06 LAB — INFLUENZA PANEL BY PCR (TYPE A & B)
Influenza A By PCR: NEGATIVE
Influenza B By PCR: NEGATIVE

## 2018-05-06 MED ORDER — PREDNISONE 20 MG PO TABS: 40.0000 mg | ORAL_TABLET | Freq: Every day | ORAL | 0 refills | Status: AC

## 2018-05-06 MED ORDER — IOHEXOL 350 MG/ML SOLN
75.0000 mL | Freq: Once | INTRAVENOUS | Status: AC | PRN
  Administered 2018-05-06: 75 mL via INTRAVENOUS
  Filled 2018-05-06: qty 75

## 2018-05-06 MED ORDER — AZITHROMYCIN 500 MG PO TABS: 500.0000 mg | ORAL_TABLET | Freq: Every day | ORAL | 0 refills | Status: AC

## 2018-05-06 MED ORDER — IPRATROPIUM-ALBUTEROL 0.5-2.5 (3) MG/3ML IN SOLN
3.0000 mL | Freq: Once | RESPIRATORY_TRACT | Status: AC
  Administered 2018-05-06: 3 mL via RESPIRATORY_TRACT
  Filled 2018-05-06: qty 3

## 2018-05-06 MED ORDER — ALBUTEROL SULFATE HFA 108 (90 BASE) MCG/ACT IN AERS: 2.0000 | INHALATION_SPRAY | Freq: Four times a day (QID) | RESPIRATORY_TRACT | 2 refills | Status: DC | PRN

## 2018-05-06 MED ORDER — SODIUM CHLORIDE 0.9 % IV BOLUS
500.0000 mL | Freq: Once | INTRAVENOUS | Status: AC
  Administered 2018-05-06: 500 mL via INTRAVENOUS

## 2018-05-06 MED ORDER — SODIUM CHLORIDE 0.9% FLUSH: 3.0000 mL | Freq: Once | INTRAVENOUS | Status: DC

## 2018-05-06 NOTE — ED Provider Notes (Signed)
-----------------------------------------   11:34 PM on 05/06/2018 -----------------------------------------   Assuming care from Dr. Quentin Cornwall.  In short, Fernando Vega is a 48 y.o. male with a chief complaint of chest pain.  Refer to the original H&P for additional details.  The current plan of care is to follow up repeat troponin and CTA chest.  CTA chest already back with no sign of acute abnormality.  Awaiting repeat troponin.  ----------------------------------------- 12:51 AM on 05/07/2018 -----------------------------------------  The patient second troponin was negative as well.  I checked on the patient and he is in no distress.  I reported the results to him and he agrees with plan for outpatient follow-up.  I gave my usual and customary return precautions.    Hinda Kehr, MD 05/07/18 [phone]

## 2018-05-06 NOTE — ED Triage Notes (Addendum)
Pt presents to ED via POV with c/o tongue numbness, SOB, generalized chest pain. Pt also c/o feeling like he cannot urinate. Pt c/o generalized chest pain that radiates to both arms. Pt states difficulty raising bilateral arms due to pain.    Pt ambulatory without difficulty, skin warm, dry, and intact, A&O x4, able to speak in complete sentences without difficulty.

## 2018-05-06 NOTE — ED Provider Notes (Signed)
[facility] Emergency Department Provider Note    First MD Initiated Contact with Patient 05/06/18 2025     (approximate)  I have reviewed the triage vital signs and the nursing notes.   HISTORY  Chief Complaint Chest Pain and Shortness of Breath    HPI Fernando Vega is a 48 y.o. male listed past medical history presents the ER for cough congestion shortness of breath for the past several days.  Has not had any measured fevers.   Has had productive cough.  No nausea or vomiting.  Denies any pressure in his chest but does feel winded.  Particularly with exertion.  Has not had any recent antibiotics.  Denies any diaphoresis.   Past Medical History:  Diagnosis Date  . Diabetes mellitus without complication ([place])   . Neuropathy   . Sarcoidosis   . Sleep apnea    History reviewed. No pertinent family history. History reviewed. No pertinent surgical history. There are no active problems to display for this patient.     Prior to Admission medications   Medication Sig Start Date End Date Taking? Authorizing Provider  albuterol (PROVENTIL HFA;VENTOLIN HFA) 108 (90 Base) MCG/ACT inhaler Inhale 2 puffs into the lungs every 6 (six) hours as needed for wheezing or shortness of breath. 05/06/18   Merlyn Lot, MD  azithromycin (ZITHROMAX) 500 MG tablet Take 1 tablet (500 mg total) by mouth daily for 3 days. Take 1 tablet daily for 3 days. 05/06/18 05/09/18  Merlyn Lot, MD  glipiZIDE (GLUCOTROL) 5 MG tablet Take 1 tablet (5 mg total) by mouth 2 (two) times daily. 09/25/17 09/25/18  Earleen Newport, MD  ibuprofen (ADVIL,MOTRIN) 600 MG tablet Take 1 tablet (600 mg total) by mouth every 8 (eight) hours as needed. 10/11/17   [name], PA-C  methocarbamol (ROBAXIN-750) 750 MG tablet Take 1 tablet (750 mg total) by mouth every 6 (six) hours as needed for muscle spasms. 10/11/17   [name], PA-C  predniSONE (DELTASONE) 20 MG tablet Take 2 tablets  (40 mg total) by mouth daily for 5 days. 05/06/18 05/11/18  Merlyn Lot, MD    Allergies Patient has no known allergies.    Social History Social History   Tobacco Use  . Smoking status: Never Smoker  . Smokeless tobacco: Never Used  Substance Use Topics  . Alcohol use: No  . Drug use: No    Review of Systems Patient denies headaches, rhinorrhea, blurry vision, numbness, shortness of breath, chest pain, edema, cough, abdominal pain, nausea, vomiting, diarrhea, dysuria, fevers, rashes or hallucinations unless otherwise stated above in HPI. ____________________________________________   PHYSICAL EXAM:  VITAL SIGNS: Vitals:   05/06/18 1743  BP: (!) 106/58  Pulse: (!) 113  Resp: 18  Temp: 99.3 F (37.4 C)  SpO2: 99%    Constitutional: Alert and oriented.  Eyes: Conjunctivae are normal.  Head: Atraumatic. Nose: No congestion/rhinnorhea. Mouth/Throat: Mucous membranes are moist.   Neck: No stridor. Painless ROM.  Cardiovascular: mildly tachycardic regular rhythm. Grossly normal heart sounds.  Good peripheral circulation. Respiratory: Normal respiratory effort.  No retractions. Lungs with coarse bibasilar breathsounds and expiratory wheeze Gastrointestinal: Soft and nontender. No distention. No abdominal bruits. No CVA tenderness. Genitourinary:  Musculoskeletal: No lower extremity tenderness nor edema.  No joint effusions. Neurologic:  Normal speech and language. No gross focal neurologic deficits are appreciated. No facial droop Skin:  Skin is warm, dry and intact. No rash noted. Psychiatric: Mood and affect are normal. Speech  and behavior are normal.  ____________________________________________   LABS (all labs ordered are listed, but only abnormal results are displayed)  Results for orders placed or performed during the hospital encounter of 05/06/18 (from the past 24 hour(s))  Basic metabolic panel     Status: Abnormal   Collection Time: 05/06/18  5:47 PM   Result Value Ref Range   Sodium 133 (L) 135 - 145 mmol/L   Potassium 3.9 3.5 - 5.1 mmol/L   Chloride 99 98 - 111 mmol/L   CO2 26 22 - 32 mmol/L   Glucose, Bld 297 (H) 70 - 99 mg/dL   BUN 18 6 - 20 mg/dL   Creatinine, Ser 0.71 0.61 - 1.24 mg/dL   Calcium 8.6 (L) 8.9 - 10.3 mg/dL   GFR calc non Af Amer >60 >60 mL/min   GFR calc Af Amer >60 >60 mL/min   Anion gap 8 5 - 15  CBC     Status: None   Collection Time: 05/06/18  5:47 PM  Result Value Ref Range   WBC 9.5 4.0 - 10.5 K/uL   RBC 5.34 4.22 - 5.81 MIL/uL   Hemoglobin 16.5 13.0 - 17.0 g/dL   HCT 48.3 39.0 - 52.0 %   MCV 90.4 80.0 - 100.0 fL   MCH 30.9 26.0 - 34.0 pg   MCHC 34.2 30.0 - 36.0 g/dL   RDW 11.9 11.5 - 15.5 %   Platelets 234 150 - 400 K/uL   nRBC 0.0 0.0 - 0.2 %  Troponin I - ONCE - STAT     Status: None   Collection Time: 05/06/18  5:47 PM  Result Value Ref Range   Troponin I <0.03 <0.03 ng/mL  Influenza panel by PCR (type A & B)     Status: None   Collection Time: 05/06/18  9:12 PM  Result Value Ref Range   Influenza A By PCR NEGATIVE NEGATIVE   Influenza B By PCR NEGATIVE NEGATIVE   ____________________________________________  EKG My review and personal interpretation at Time: 17:46   Indication: sob  Rate: 110  Rhythm: sinus Axis: right Other: No specific ST abnormality.  No evidence of STEMI.  Will repeat EKG due to possible limb lead reversal. ____________________________________________  RADIOLOGY  I personally reviewed all radiographic images ordered to evaluate for the above acute complaints and reviewed radiology reports and findings.  These findings were personally discussed with the patient.  Please see medical record for radiology report.  ____________________________________________   PROCEDURES  Procedure(s) performed:  Procedures    Critical Care performed: no ____________________________________________   INITIAL IMPRESSION / ASSESSMENT AND PLAN / ED COURSE  Pertinent labs &  imaging results that were available during my care of the patient were reviewed by me and considered in my medical decision making (see chart for details).   DDX: Dehydration, pneumonia, influenza, bronchitis, asthma  Fernando Vega is a 48 y.o. who presents to the ED with symptoms as described above.  Seems to be having flulike illness.  Low-grade temperature mild tachycardia with some wheezing on exam will give nebulizer treatment and reassess.  Will check for flu.  Will order chest x-ray to exclude pneumonia.  Have low suspicion for PE on exam and presentation.  Clinical Course as of May 06 2217  Mon May 06, 2018  2158 Patient reassessed.  Significant improvement after nebulizer treatment.  Still mildly tachycardic but is also had nebulizers and as his symptoms have significantly improved I do have a high suspicion for  bronchitis.  Denies any chest pain.  No pleuritic discomfort.  I have a low suspicion for PE.  If it is low-grade temperature do suspect bronchitis therefore will give course of antibiotics as well as steroids and nebulizer treatment.   [PR]  2208 Patient now stating he is still having some discomfort with deep inspiration.  Did have significant improvement after nebulizer treatment but as he did have rightward axis deviation that appeared new on EKG as compared to previous will order CT angiogram to exclude PE.  We will also repeat troponin.   [PR]    Clinical Course User Index [PR] Merlyn Lot, MD     As part of my medical decision making, I reviewed the following data within the [place] notes reviewed and incorporated, Labs reviewed, notes from prior ED visits and [place] Controlled Substance Database   ____________________________________________   FINAL CLINICAL IMPRESSION(S) / ED DIAGNOSES  Final diagnoses:  Bronchitis      NEW MEDICATIONS STARTED DURING THIS VISIT:  New Prescriptions   ALBUTEROL (PROVENTIL HFA;VENTOLIN HFA) 108  (90 BASE) MCG/ACT INHALER    Inhale 2 puffs into the lungs every 6 (six) hours as needed for wheezing or shortness of breath.   AZITHROMYCIN (ZITHROMAX) 500 MG TABLET    Take 1 tablet (500 mg total) by mouth daily for 3 days. Take 1 tablet daily for 3 days.   PREDNISONE (DELTASONE) 20 MG TABLET    Take 2 tablets (40 mg total) by mouth daily for 5 days.     Note:  This document was prepared using Dragon voice recognition software and may include unintentional dictation errors.    Merlyn Lot, MD 05/06/18 2219

## 2018-05-07 LAB — TROPONIN I: Troponin I: <0.03 ng/mL (ref <0.03)

## 2019-02-07 ENCOUNTER — Encounter: Payer: Self-pay | Admitting: Emergency Medicine

## 2019-02-07 ENCOUNTER — Other Ambulatory Visit: Payer: Self-pay

## 2019-02-07 ENCOUNTER — Emergency Department
Admission: EM | Admit: 2019-02-07 | Discharge: 2019-02-07 | Disposition: A | Discharge status: Home / Self Care | Payer: No Typology Code available for payment source | Attending: Emergency Medicine | Admitting: Emergency Medicine

## 2019-02-07 ENCOUNTER — Emergency Department: Payer: No Typology Code available for payment source

## 2019-02-07 DIAGNOSIS — Y998 Other external cause status: Secondary | ICD-10-CM | POA: Diagnosis not present

## 2019-02-07 DIAGNOSIS — Y9389 Activity, other specified: Secondary | ICD-10-CM | POA: Diagnosis not present

## 2019-02-07 DIAGNOSIS — E1140 Type 2 diabetes mellitus with diabetic neuropathy, unspecified: Secondary | ICD-10-CM | POA: Insufficient documentation

## 2019-02-07 DIAGNOSIS — Y92410 Unspecified street and highway as the place of occurrence of the external cause: Secondary | ICD-10-CM | POA: Insufficient documentation

## 2019-02-07 DIAGNOSIS — S161XXA Strain of muscle, fascia and tendon at neck level, initial encounter: Secondary | ICD-10-CM | POA: Diagnosis not present

## 2019-02-07 DIAGNOSIS — Z7984 Long term (current) use of oral hypoglycemic drugs: Secondary | ICD-10-CM | POA: Insufficient documentation

## 2019-02-07 DIAGNOSIS — S199XXA Unspecified injury of neck, initial encounter: Secondary | ICD-10-CM | POA: Diagnosis present

## 2019-02-07 MED ORDER — CYCLOBENZAPRINE HCL 10 MG PO TABS: 10.0000 mg | ORAL_TABLET | Freq: Three times a day (TID) | ORAL | 0 refills | Status: DC | PRN

## 2019-02-07 MED ORDER — NAPROXEN 500 MG PO TABS: 500.0000 mg | ORAL_TABLET | Freq: Two times a day (BID) | ORAL | 0 refills | Status: DC
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
## 2019-09-01 ENCOUNTER — Other Ambulatory Visit: Payer: Self-pay

## 2019-09-01 ENCOUNTER — Encounter: Payer: Self-pay | Admitting: Emergency Medicine

## 2019-09-01 ENCOUNTER — Emergency Department: Payer: Self-pay

## 2019-09-01 ENCOUNTER — Emergency Department
Admission: EM | Admit: 2019-09-01 | Discharge: 2019-09-01 | Disposition: A | Discharge status: Home / Self Care | Payer: Self-pay | Attending: Emergency Medicine | Admitting: Emergency Medicine

## 2019-09-01 DIAGNOSIS — R739 Hyperglycemia, unspecified: Secondary | ICD-10-CM

## 2019-09-01 DIAGNOSIS — Z7984 Long term (current) use of oral hypoglycemic drugs: Secondary | ICD-10-CM | POA: Insufficient documentation

## 2019-09-01 DIAGNOSIS — E1165 Type 2 diabetes mellitus with hyperglycemia: Secondary | ICD-10-CM | POA: Insufficient documentation

## 2019-09-01 DIAGNOSIS — R0789 Other chest pain: Secondary | ICD-10-CM | POA: Insufficient documentation

## 2019-09-01 LAB — BASIC METABOLIC PANEL
Anion gap: 13 (ref 5–15)
BUN: 16 mg/dL (ref 6–20)
CO2: 25 mmol/L (ref 22–32)
Calcium: 8.6 mg/dL — ABNORMAL LOW (ref 8.9–10.3)
Chloride: 96 mmol/L — ABNORMAL LOW (ref 98–111)
Creatinine, Ser: 0.95 mg/dL (ref 0.61–1.24)
GFR calc Af Amer: >60 mL/min (ref >60)
GFR calc non Af Amer: >60 mL/min (ref >60)
Glucose, Bld: 504 mg/dL (ref 70–99)
Potassium: 4.1 mmol/L (ref 3.5–5.1)
Sodium: 134 mmol/L — ABNORMAL LOW (ref 135–145)

## 2019-09-01 LAB — CBC
HCT: 41.9 % (ref 39.0–52.0)
Hemoglobin: 14.2 g/dL (ref 13.0–17.0)
MCH: 29.8 pg (ref 26.0–34.0)
MCHC: 33.9 g/dL (ref 30.0–36.0)
MCV: 88 fL (ref 80.0–100.0)
Platelets: 233 10*3/uL (ref 150–400)
RBC: 4.76 MIL/uL (ref 4.22–5.81)
RDW: 11.8 % (ref 11.5–15.5)
WBC: 7.1 10*3/uL (ref 4.0–10.5)
nRBC: 0 % (ref 0.0–0.2)

## 2019-09-01 LAB — TROPONIN I (HIGH SENSITIVITY)
Troponin I (High Sensitivity): 5 ng/L (ref <18)
Troponin I (High Sensitivity): 4 ng/L (ref <18)

## 2019-09-01 LAB — GLUCOSE, CAPILLARY: Glucose-Capillary: 279 mg/dL — ABNORMAL HIGH (ref 70–99)

## 2019-09-01 MED ORDER — SODIUM CHLORIDE 0.9 % IV BOLUS
1000.0000 mL | Freq: Once | INTRAVENOUS | Status: AC
  Administered 2019-09-01: 1000 mL via INTRAVENOUS

## 2019-09-01 MED ORDER — ALUM & MAG HYDROXIDE-SIMETH 200-200-20 MG/5ML PO SUSP
15.0000 mL | Freq: Once | ORAL | Status: AC
  Administered 2019-09-01: 15 mL via ORAL
  Filled 2019-09-01: qty 30

## 2019-09-01 MED ORDER — FAMOTIDINE 20 MG PO TABS
20.0000 mg | ORAL_TABLET | Freq: Once | ORAL | Status: AC
  Administered 2019-09-01: 20 mg via ORAL
  Filled 2019-09-01: qty 1

## 2019-09-01 MED ORDER — GLIPIZIDE 5 MG PO TABS: 5.0000 mg | ORAL_TABLET | Freq: Two times a day (BID) | ORAL | 2 refills | Status: DC

## 2019-09-01 MED ORDER — FAMOTIDINE 20 MG PO TABS: 20.0000 mg | ORAL_TABLET | Freq: Two times a day (BID) | ORAL | 0 refills | Status: DC

## 2019-09-01 NOTE — ED Triage Notes (Signed)
Patient to ER for c/o chest pain to midsternal area with +diaphoresis, nausea, and shortness of breath. Patient states he is feeling some better now. Reports symptoms began while sitting down to eat. Has had similar episodes with less severity in the past that resolved on their own.

## 2019-09-01 NOTE — Discharge Instructions (Addendum)
We suspect that your chest pain is related to stomach acid.  Take the Pepcid twice daily for the next 1 to 2 weeks.  You should restart on the glipizide.  We have prescribed a month supply with 2 refills.  You should follow-up with a primary care doctor at the [facility] clinic.  Return to the ER for new, worsening, or persistent severe chest pain, difficulty breathing, vomiting, high blood sugars, or any other new or worsening symptoms that concern you.

## 2019-09-01 NOTE — ED Notes (Signed)
Dr. Siadecki at bedside 

## 2019-09-01 NOTE — ED Provider Notes (Signed)
[facility] Emergency Department Provider Note ____________________________________________   First MD Initiated Contact with Patient 09/01/19 2049     (approximate)  I have reviewed the triage vital signs and the nursing notes.   HISTORY  Chief Complaint Chest Pain    HPI Fernando Vega is a 49 y.o. male with PMH as noted below who presents with chest pain, acute onset earlier this morning, subsequently resolved, and then reoccurring after he ate dinner this evening.  The pain is described as sharp and burning, and is in his central chest.  It is now somewhat improved.  He states that he has had it once or twice before but not as severe.  He reports associated nausea but no vomiting.  He has no associated shortness of breath or lightheadedness.  The patient states that he currently is not on any medication for diabetes.  He previously was on Metformin, but had significant diarrhea.  He also took glipizide and tolerated well, but does not have a primary care doctor or insurance.  Past Medical History:  Diagnosis Date  . Diabetes mellitus without complication ([place])   . Neuropathy   . Sarcoidosis   . Sleep apnea     There are no problems to display for this patient.   History reviewed. No pertinent surgical history.  Prior to Admission medications   Medication Sig Start Date End Date Taking? Authorizing Provider  albuterol (PROVENTIL HFA;VENTOLIN HFA) 108 (90 Base) MCG/ACT inhaler Inhale 2 puffs into the lungs every 6 (six) hours as needed for wheezing or shortness of breath. 05/06/18   Merlyn Lot, MD  cyclobenzaprine (FLEXERIL) 10 MG tablet Take 1 tablet (10 mg total) by mouth 3 (three) times daily as needed. 02/07/19   Triplett, Johnette Abraham B, FNP  famotidine (PEPCID) 20 MG tablet Take 1 tablet (20 mg total) by mouth 2 (two) times daily for 15 days. 09/01/19 09/16/19  Arta Silence, MD  glipiZIDE (GLUCOTROL) 5 MG tablet Take 1 tablet (5 mg total) by  mouth 2 (two) times daily. 09/01/19 11/30/19  Arta Silence, MD  naproxen (NAPROSYN) 500 MG tablet Take 1 tablet (500 mg total) by mouth 2 (two) times daily with a meal. 02/07/19   Triplett, Cari B, FNP    Allergies Patient has no known allergies.  No family history on file.  Social History Social History   Tobacco Use  . Smoking status: Never Smoker  . Smokeless tobacco: Never Used  Substance Use Topics  . Alcohol use: No  . Drug use: No    Review of Systems  Constitutional: No fever/chills. Eyes: No visual changes. ENT: No sore throat. Cardiovascular: Positive for chest pain. Respiratory: Denies shortness of breath. Gastrointestinal: Positive for nausea.  No vomiting or diarrhea. Genitourinary: Negative for flank pain. Musculoskeletal: Negative for back pain. Skin: Negative for rash. Neurological: Negative for headache.   ____________________________________________   PHYSICAL EXAM:  VITAL SIGNS: ED Triage Vitals  Enc Vitals Group     BP 09/01/19 1746 119/72     Pulse Rate 09/01/19 1746 93     Resp 09/01/19 1746 20     Temp 09/01/19 1746 98.2 F (36.8 C)     Temp Source 09/01/19 1746 Oral     SpO2 09/01/19 1746 94 %     Weight 09/01/19 1747 188 lb (85.3 kg)     Height 09/01/19 1747 5\' 7"  (1.702 m)     Head Circumference --      Peak Flow --  Pain Score 09/01/19 1747 3     Pain Loc --      Pain Edu? --      Excl. in [place]? --     Constitutional: Alert and oriented. Well appearing and in no acute distress. Eyes: Conjunctivae are normal.  Head: Atraumatic. Nose: No congestion/rhinnorhea. Mouth/Throat: Mucous membranes are moist.   Neck: Normal range of motion.  Cardiovascular: Normal rate, regular rhythm. Grossly normal heart sounds.  Good peripheral circulation. Respiratory: Normal respiratory effort.  No retractions. Lungs CTAB. Gastrointestinal: Soft and nontender. No distention.  Genitourinary: No flank tenderness. Musculoskeletal: No lower  extremity edema.  Extremities warm and well perfused.  Neurologic:  Normal speech and language. No gross focal neurologic deficits are appreciated.  Skin:  Skin is warm and dry. No rash noted. Psychiatric: Mood and affect are normal. Speech and behavior are normal.  ____________________________________________   LABS (all labs ordered are listed, but only abnormal results are displayed)  Labs Reviewed  BASIC METABOLIC PANEL - Abnormal; Notable for the following components:      Result Value   Sodium 134 (*)    Chloride 96 (*)    Glucose, Bld 504 (*)    Calcium 8.6 (*)    All other components within normal limits  GLUCOSE, CAPILLARY - Abnormal; Notable for the following components:   Glucose-Capillary 279 (*)    All other components within normal limits  CBC  TROPONIN I (HIGH SENSITIVITY)  TROPONIN I (HIGH SENSITIVITY)   ____________________________________________  EKG  ED ECG REPORT I, Arta Silence, the attending physician, personally viewed and interpreted this ECG.  Date: 09/01/2019 EKG Time: 1738 Rate: 95 Rhythm: normal sinus rhythm QRS Axis: normal Intervals: LPF B ST/T Wave abnormalities: normal Narrative Interpretation: no evidence of acute ischemia; no significant change when compared to EKG of 05/06/2018  ____________________________________________  RADIOLOGY  CXR: No focal infiltrate or edema  ____________________________________________   PROCEDURES  Procedure(s) performed: No  Procedures  Critical Care performed: No ____________________________________________   INITIAL IMPRESSION / ASSESSMENT AND PLAN / ED COURSE  Pertinent labs & imaging results that were available during my care of the patient were reviewed by me and considered in my medical decision making (see chart for details).  49 year old male with PMH as noted above including diabetes not currently on any medication presents with atypical chest pain, 2 episodes today, once  in the morning and then again after eating this evening.  I reviewed the past medical records in [place].  The patient was most recently seen in the ED on 05/06/2018 due to chest pain, which she states was somewhat different than today.  He was ruled out for ACS and had a negative CT angio of the chest.  On exam today, the patient is overall well-appearing.  His vital signs are normal.  The physical exam is unremarkable.  EKG is nonischemic.  Lab work-up was obtained from triage and is unremarkable except for hyperglycemia, however the patient does not have an elevated anion gap or evidence of DKA.  Initial troponin is negative.  Overall presentation is most consistent with GERD.  I have a low suspicion for ACS.  There is no clinical evidence for PE or vascular etiology.  We will obtain a repeat troponin, give fluids to treat the hyperglycemia, and reassess.  ----------------------------------------- 10:23 PM on 09/01/2019 -----------------------------------------  Glucose is now in the 200s after fluids.  The patient reports complete resolution of his symptoms.  The repeat troponin is negative.  At this time,  the patient is stable for discharge home.  I counseled him on the results of the work-up and the plan of care.  I will restart him on his old dose of glipizide which worked well for him previously.  I will also start him on a short course of Pepcid.  I have strongly advised him to follow-up with a primary care provider, and have referred him to the [facility] clinic.  I gave him thorough return precautions and he expressed understanding.  ____________________________________________   FINAL CLINICAL IMPRESSION(S) / ED DIAGNOSES  Final diagnoses:  Atypical chest pain  Hyperglycemia      NEW MEDICATIONS STARTED DURING THIS VISIT:  New Prescriptions   FAMOTIDINE (PEPCID) 20 MG TABLET    Take 1 tablet (20 mg total) by mouth 2 (two) times daily for 15 days.     Note:  This  document was prepared using Dragon voice recognition software and may include unintentional dictation errors.    Arta Silence, MD 09/01/19 2224

## 2019-09-01 NOTE — ED Notes (Signed)
Pt given warm blanket and remote per request

## 2019-10-17 ENCOUNTER — Emergency Department
Admission: EM | Admit: 2019-10-17 | Discharge: 2019-10-17 | Disposition: A | Discharge status: Home / Self Care | Payer: Self-pay | Attending: Emergency Medicine | Admitting: Emergency Medicine

## 2019-10-17 ENCOUNTER — Other Ambulatory Visit: Payer: Self-pay

## 2019-10-17 ENCOUNTER — Encounter: Payer: Self-pay | Admitting: Emergency Medicine

## 2019-10-17 DIAGNOSIS — E1140 Type 2 diabetes mellitus with diabetic neuropathy, unspecified: Secondary | ICD-10-CM | POA: Insufficient documentation

## 2019-10-17 DIAGNOSIS — H6981 Other specified disorders of Eustachian tube, right ear: Secondary | ICD-10-CM

## 2019-10-17 DIAGNOSIS — H6991 Unspecified Eustachian tube disorder, right ear: Secondary | ICD-10-CM | POA: Insufficient documentation

## 2019-10-17 MED ORDER — FLUTICASONE PROPIONATE 50 MCG/ACT NA SUSP: 2.0000 | Freq: Every day | NASAL | 0 refills | Status: DC

## 2019-10-17 MED ORDER — PREDNISONE 10 MG PO TABS: ORAL_TABLET | ORAL | 0 refills | Status: DC

## 2019-10-17 NOTE — Discharge Instructions (Addendum)
Call make an appoint with Dr. Pryor Ochoa at [facility] if any continued problems.  2 medications were sent to your pharmacy.  1 is Flonase nasal spray which she will spray to both nostrils once daily.  This medication contains a steroid for inside your nose.  The other is prednisone which she will start today and take 6 tablets and taper down by 1 tablet each day.  This medication can cause temporary difficulty sleeping, increased appetite or jittery numbness.  This is temporary and because of the prednisone.

## 2019-10-17 NOTE — ED Triage Notes (Signed)
Pt presents to ED via POV with c/o loss of hearing to R ear since this morning at approx 0600. Tp denies pain at this time.

## 2019-10-17 NOTE — ED Provider Notes (Signed)
[facility] Emergency Department Provider Note   ____________________________________________   First MD Initiated Contact with Patient 10/17/19 1403     (approximate)  I have reviewed the triage vital signs and the nursing notes.   HISTORY  Chief Complaint Decreased Hearing   HPI Fernando Vega is a 49 y.o. male presents to the ED with complaint of decreased hearing in his right ear since this morning at approximately 6 AM.  Patient states he was talking to someone on the phone at the time and had to switch to his left ear due to decreased hearing.  Patient denies any upper respiratory symptoms, fever, chills or cough.  Patient denies any injury to his ear.  He denies any pain.       Past Medical History:  Diagnosis Date  . Diabetes mellitus without complication ([place])   . Neuropathy   . Sarcoidosis   . Sleep apnea     There are no problems to display for this patient.   History reviewed. No pertinent surgical history.  Prior to Admission medications   Medication Sig Start Date End Date Taking? Authorizing Provider  albuterol (PROVENTIL HFA;VENTOLIN HFA) 108 (90 Base) MCG/ACT inhaler Inhale 2 puffs into the lungs every 6 (six) hours as needed for wheezing or shortness of breath. 05/06/18   Merlyn Lot, MD  famotidine (PEPCID) 20 MG tablet Take 1 tablet (20 mg total) by mouth 2 (two) times daily for 15 days. 09/01/19 09/16/19  Arta Silence, MD  fluticasone (FLONASE) 50 MCG/ACT nasal spray Place 2 sprays into both nostrils daily. 10/17/19 10/16/20  [name], PA-C  glipiZIDE (GLUCOTROL) 5 MG tablet Take 1 tablet (5 mg total) by mouth 2 (two) times daily. 09/01/19 11/30/19  Arta Silence, MD  predniSONE (DELTASONE) 10 MG tablet Take 6 tablets  today, on day 2 take 5 tablets, day 3 take 4 tablets, day 4 take 3 tablets, day 5 take  2 tablets and 1 tablet the last day 10/17/19   [name], PA-C    Allergies Patient has no known  allergies.  History reviewed. No pertinent family history.  Social History Social History   Tobacco Use  . Smoking status: Never Smoker  . Smokeless tobacco: Never Used  Substance Use Topics  . Alcohol use: No  . Drug use: No    Review of Systems Constitutional: No fever/chills Eyes: No visual changes. ENT: No sore throat.  Decreased hearing right ear. Cardiovascular: Denies chest pain. Respiratory: Denies shortness of breath.  Negative for cough. Gastrointestinal: No abdominal pain.  No nausea, no vomiting.  Musculoskeletal: Negative for back pain. Skin: Negative for rash. Neurological: Negative for headaches, focal weakness or numbness. ____________________________________________   PHYSICAL EXAM:  VITAL SIGNS: ED Triage Vitals [10/17/19 1329]  Enc Vitals Group     BP 130/67     Pulse Rate 88     Resp 20     Temp 98 F (36.7 C)     Temp Source Oral     SpO2 98 %     Weight 189 lb (85.7 kg)     Height 5\' 7"  (1.702 m)     Head Circumference      Peak Flow      Pain Score 0     Pain Loc      Pain Edu?      Excl. in [place]?     Constitutional: Alert and oriented. Well appearing and in no acute distress. Eyes: Conjunctivae are  normal. PERRL. EOMI. Head: Atraumatic. Nose: No congestion/rhinnorhea. Ears: Left EAC and TM clear.  Right EAC is clear.  TM is dull with poor light reflex.  Patient also states that he is unable to get his right ear to pop.  His left ear equalizes pressure well. Mouth/Throat: Mucous membranes are moist.  Oropharynx non-erythematous. Neck: No stridor.   Hematological/Lymphatic/Immunilogical: No cervical lymphadenopathy. Cardiovascular: Normal rate, regular rhythm. Grossly normal heart sounds.  Good peripheral circulation. Respiratory: Normal respiratory effort.  No retractions. Lungs CTAB. Musculoskeletal: Moves upper and lower extremities without difficulty.  Normal gait was noted. Neurologic:  Normal speech and language. No gross focal  neurologic deficits are appreciated. Skin:  Skin is warm, dry and intact.  Psychiatric: Mood and affect are normal. Speech and behavior are normal.  ____________________________________________   LABS (all labs ordered are listed, but only abnormal results are displayed)  Labs Reviewed - No data to display ____________________________________________  PROCEDURES  Procedure(s) performed (including Critical Care):  Procedures   ____________________________________________   INITIAL IMPRESSION / ASSESSMENT AND PLAN / ED COURSE  As part of my medical decision making, I reviewed the following data within the electronic MEDICAL RECORD NUMBER Notes from prior ED visits and [place] Controlled Substance Database  Fernando Vega was evaluated in Emergency Department on 10/17/2019 for the symptoms described in the history of present illness. He was evaluated in the context of the global COVID-19 pandemic, which necessitated consideration that the patient might be at risk for infection with the SARS-CoV-2 virus that causes COVID-19. Institutional protocols and algorithms that pertain to the evaluation of patients at risk for COVID-19 are in a state of rapid change based on information released by regulatory bodies including the CDC and federal and state organizations. These policies and algorithms were followed during the patient's care in the ED.  49 year old male presents to the ED with complaint of decreased hearing in his right ear.  Patient denies any upper respiratory symptoms or trauma to his ear.  On exam findings are consistent with eustachian tube dysfunction.  Patient was made aware that he would need both the Flonase nasal spray and prednisone to help.  If he continues to have problems with his ear he is to follow-up with [facility], Dr. Pryor Ochoa.    ____________________________________________   FINAL CLINICAL IMPRESSION(S) / ED DIAGNOSES  Final diagnoses:  Eustachian tube dysfunction, right       ED Discharge Orders         Ordered    fluticasone (FLONASE) 50 MCG/ACT nasal spray  Daily     Discontinue  Reprint     10/17/19 1426    predniSONE (DELTASONE) 10 MG tablet     Discontinue  Reprint     10/17/19 1426           Note:  This document was prepared using Dragon voice recognition software and may include unintentional dictation errors.    [name], PA-C 10/17/19 1450    Lavonia Drafts, MD 10/17/19 1451

## 2019-10-24 ENCOUNTER — Emergency Department
Admission: EM | Admit: 2019-10-24 | Discharge: 2019-10-24 | Disposition: A | Discharge status: Home / Self Care | Payer: Self-pay | Attending: Emergency Medicine | Admitting: Emergency Medicine

## 2019-10-24 ENCOUNTER — Other Ambulatory Visit: Payer: Self-pay

## 2019-10-24 DIAGNOSIS — H6981 Other specified disorders of Eustachian tube, right ear: Secondary | ICD-10-CM

## 2019-10-24 DIAGNOSIS — H6992 Unspecified Eustachian tube disorder, left ear: Secondary | ICD-10-CM | POA: Insufficient documentation

## 2019-10-24 DIAGNOSIS — H9201 Otalgia, right ear: Secondary | ICD-10-CM | POA: Insufficient documentation

## 2019-10-24 DIAGNOSIS — E119 Type 2 diabetes mellitus without complications: Secondary | ICD-10-CM | POA: Insufficient documentation

## 2019-10-24 MED ORDER — AMOXICILLIN 500 MG PO CAPS: 500.0000 mg | ORAL_CAPSULE | Freq: Three times a day (TID) | ORAL | 0 refills | Status: DC

## 2019-10-24 MED ORDER — AMOXICILLIN 500 MG PO CAPS
500.0000 mg | ORAL_CAPSULE | Freq: Once | ORAL | Status: AC
  Administered 2019-10-24: 500 mg via ORAL

## 2019-10-24 NOTE — ED Notes (Signed)
Right ear can't hear for 2 weeks. Reports he took medications as prescribed reports started to have pain rates pain 3/10

## 2019-10-24 NOTE — ED Provider Notes (Signed)
[address] [facility] Emergency Department Provider Note  ____________________________________________   First MD Initiated Contact with Patient 10/24/19 2125     (approximate)  I have reviewed the triage vital signs and the nursing notes.   HISTORY  Chief Complaint Otalgia    HPI Fernando Vega is a 49 y.o. male is returning due to continued right ear pain.  Patient states that he was seen a week ago and given prednisone which did help and now it is coming back.  Pain increased after he got water in his ear earlier tonight.  He has had no fever or chills.  Continues to have decreased hearing and has not followed up as instructed.    Past Medical History:  Diagnosis Date  . Diabetes mellitus without complication ([place])   . Neuropathy   . Sarcoidosis   . Sleep apnea     There are no problems to display for this patient.   No past surgical history on file.  Prior to Admission medications   Medication Sig Start Date End Date Taking? Authorizing Provider  albuterol (PROVENTIL HFA;VENTOLIN HFA) 108 (90 Base) MCG/ACT inhaler Inhale 2 puffs into the lungs every 6 (six) hours as needed for wheezing or shortness of breath. 05/06/18   Merlyn Lot, MD  amoxicillin (AMOXIL) 500 MG capsule Take 1 capsule (500 mg total) by mouth 3 (three) times daily. 10/24/19   Damani Rando, Linden Dolin, PA-C  famotidine (PEPCID) 20 MG tablet Take 1 tablet (20 mg total) by mouth 2 (two) times daily for 15 days. 09/01/19 09/16/19  Arta Silence, MD  fluticasone (FLONASE) 50 MCG/ACT nasal spray Place 2 sprays into both nostrils daily. 10/17/19 10/16/20  [name], PA-C  glipiZIDE (GLUCOTROL) 5 MG tablet Take 1 tablet (5 mg total) by mouth 2 (two) times daily. 09/01/19 11/30/19  Arta Silence, MD  predniSONE (DELTASONE) 10 MG tablet Take 6 tablets  today, on day 2 take 5 tablets, day 3 take 4 tablets, day 4 take 3 tablets, day 5 take  2 tablets and 1 tablet the last day 10/17/19   [name], PA-C    Allergies Patient has no known allergies.  No family history on file.  Social History Social History   Tobacco Use  . Smoking status: Never Smoker  . Smokeless tobacco: Never Used  Substance Use Topics  . Alcohol use: No  . Drug use: No    Review of Systems  Constitutional: No fever/chills Eyes: No visual changes. ENT: No sore throat.  Positive ear pain Respiratory: Denies cough Cardiovascular: Denies chest pain Gastrointestinal: Denies abdominal pain Genitourinary: Negative for dysuria. Musculoskeletal: Negative for back pain. Skin: Negative for rash. Psychiatric: no mood changes,     ____________________________________________   PHYSICAL EXAM:  VITAL SIGNS: ED Triage Vitals  Enc Vitals Group     BP 10/24/19 2006 126/71     Pulse Rate 10/24/19 2006 92     Resp 10/24/19 2006 18     Temp 10/24/19 2006 98.8 F (37.1 C)     Temp Source 10/24/19 2006 Oral     SpO2 10/24/19 2006 97 %     Weight 10/24/19 2006 189 lb (85.7 kg)     Height --      Head Circumference --      Peak Flow --      Pain Score 10/24/19 2014 3     Pain Loc --      Pain Edu? --  Excl. in [place]? --     Constitutional: Alert and oriented. Well appearing and in no acute distress. Eyes: Conjunctivae are normal.  Head: Atraumatic. Ears: TMs are slightly dull bilaterally, no erythema noted in the ear canal, patient is able to hear soft whisper Nose: No congestion/rhinnorhea. Mouth/Throat: Mucous membranes are moist.   Neck:  supple no lymphadenopathy noted Cardiovascular: Normal rate, regular rhythm. Heart sounds are normal Respiratory: Normal respiratory effort.  No retractions, lungs c t a  GU: deferred Musculoskeletal: FROM all extremities, warm and well perfused Neurologic:  Normal speech and language.  Skin:  Skin is warm, dry and intact. No rash noted. Psychiatric: Mood and affect are normal. Speech and behavior are  normal.  ____________________________________________   LABS (all labs ordered are listed, but only abnormal results are displayed)  Labs Reviewed - No data to display ____________________________________________   ____________________________________________  RADIOLOGY    ____________________________________________   PROCEDURES  Procedure(s) performed: No  Procedures    ____________________________________________   INITIAL IMPRESSION / ASSESSMENT AND PLAN / ED COURSE  Pertinent labs & imaging results that were available during my care of the patient were reviewed by me and considered in my medical decision making (see chart for details).   Patient is a 49 year old male returns the emergency department for repeat complaint of right ear pain.  See HPI.  Physical exam shows patient appear well.  TMs are slightly dull but no other abnormality is noted.  Remainder exams unremarkable  Did explain findings to the patient.  I encouraged him to follow-up as instructed.  Explained to him that we will not get to the bottom of the problem until he sees ENT doctor.  He should follow-up at [facility].  Was given a prescription for amoxicillin.  He was discharged in stable condition.     [name] was evaluated in Emergency Department on 10/24/2019 for the symptoms described in the history of present illness. He was evaluated in the context of the global COVID-19 pandemic, which necessitated consideration that the patient might be at risk for infection with the SARS-CoV-2 virus that causes COVID-19. Institutional protocols and algorithms that pertain to the evaluation of patients at risk for COVID-19 are in a state of rapid change based on information released by regulatory bodies including the CDC and federal and state organizations. These policies and algorithms were followed during the patient's care in the ED.    As part of my medical decision making, I reviewed the  following data within the [place] notes reviewed and incorporated, Old chart reviewed, Notes from prior ED visits and [place] Controlled Substance Database  ____________________________________________   FINAL CLINICAL IMPRESSION(S) / ED DIAGNOSES  Final diagnoses:  Right ear pain  Eustachian tube dysfunction, right      NEW MEDICATIONS STARTED DURING THIS VISIT:  New Prescriptions   AMOXICILLIN (AMOXIL) 500 MG CAPSULE    Take 1 capsule (500 mg total) by mouth 3 (three) times daily.     Note:  This document was prepared using Dragon voice recognition software and may include unintentional dictation errors.    Versie Starks, PA-C 10/24/19 2150    Lucrezia Starch, MD 10/24/19 2206

## 2019-10-24 NOTE — ED Triage Notes (Signed)
Patient reports seen for same symptoms approximately 1 week ago.  Reports since then has gotten water in the ear and it is now painful.

## 2019-10-24 NOTE — Discharge Instructions (Addendum)
Follow-up with [facility].  Please call for appointment.  Return if worsening.  Use medication as prescribed.  Take Tylenol and ibuprofen for pain as needed.

## 2020-08-11 ENCOUNTER — Emergency Department
Admission: EM | Admit: 2020-08-11 | Discharge: 2020-08-11 | Disposition: A | Discharge status: Home / Self Care | Payer: Self-pay | Attending: Emergency Medicine | Admitting: Emergency Medicine

## 2020-08-11 ENCOUNTER — Other Ambulatory Visit: Payer: Self-pay

## 2020-08-11 DIAGNOSIS — E1140 Type 2 diabetes mellitus with diabetic neuropathy, unspecified: Secondary | ICD-10-CM | POA: Insufficient documentation

## 2020-08-11 DIAGNOSIS — L03116 Cellulitis of left lower limb: Secondary | ICD-10-CM | POA: Insufficient documentation

## 2020-08-11 MED ORDER — NAPROXEN 500 MG PO TABS: 500.0000 mg | ORAL_TABLET | Freq: Two times a day (BID) | ORAL | 0 refills | Status: DC

## 2020-08-11 MED ORDER — SULFAMETHOXAZOLE-TRIMETHOPRIM 800-160 MG PO TABS: 1.0000 | ORAL_TABLET | Freq: Two times a day (BID) | ORAL | 0 refills | Status: DC

## 2020-08-11 NOTE — Discharge Instructions (Addendum)
Please call and schedule follow-up appointment with primary care.  [employer] has been provided.    Return to the emergency department for symptoms that change or worsen if you are not improving over the next 2 to 3 days.

## 2020-08-11 NOTE — ED Notes (Signed)
TED hose requested from ortho floor.

## 2020-08-11 NOTE — ED Notes (Signed)
Patient reports swelling to the L foot and L toes x 1 day. Patient denies pain.

## 2020-08-11 NOTE — ED Triage Notes (Signed)
Pt come with c/o left leg pain and swelling. Pt states this just started few hours ago. Pt denies any recent injuries.

## 2020-08-11 NOTE — ED Provider Notes (Signed)
[facility] Emergency Department Provider Note ____________________________________________  Time seen: Approximately 4:30 PM  I have reviewed the triage vital signs and the nursing notes.   HISTORY  Chief Complaint Leg Pain    HPI Fernando Vega is a 50 y.o. male who presents to the emergency department for evaluation and treatment of left lower extremity pain and swelling.  He states that he noticed to insect bites on his lower leg probably 4 nights ago.  Today around 2 PM he noticed that his lower leg was swelling to the point it was hard to get his shoe on.  He denies fever.  No history of PE or DVT.  No cough or shortness of breath.   Past Medical History:  Diagnosis Date  . Diabetes mellitus without complication ([place])   . Neuropathy   . Sarcoidosis   . Sleep apnea     There are no problems to display for this patient.   History reviewed. No pertinent surgical history.  Prior to Admission medications   Medication Sig Start Date End Date Taking? Authorizing Provider  naproxen (NAPROSYN) 500 MG tablet Take 1 tablet (500 mg total) by mouth 2 (two) times daily with a meal. 08/11/20  Yes Sherby Moncayo B, FNP  sulfamethoxazole-trimethoprim (BACTRIM DS) 800-160 MG tablet Take 1 tablet by mouth 2 (two) times daily. 08/11/20  Yes Elora Wolter B, FNP  albuterol (PROVENTIL HFA;VENTOLIN HFA) 108 (90 Base) MCG/ACT inhaler Inhale 2 puffs into the lungs every 6 (six) hours as needed for wheezing or shortness of breath. 05/06/18   Merlyn Lot, MD  famotidine (PEPCID) 20 MG tablet Take 1 tablet (20 mg total) by mouth 2 (two) times daily for 15 days. 09/01/19 09/16/19  Arta Silence, MD  fluticasone (FLONASE) 50 MCG/ACT nasal spray Place 2 sprays into both nostrils daily. 10/17/19 10/16/20  [name], PA-C  glipiZIDE (GLUCOTROL) 5 MG tablet Take 1 tablet (5 mg total) by mouth 2 (two) times daily. 09/01/19 11/30/19  Arta Silence, MD     Allergies Patient has no known allergies.  No family history on file.  Social History Social History   Tobacco Use  . Smoking status: Never Smoker  . Smokeless tobacco: Never Used  Substance Use Topics  . Alcohol use: No  . Drug use: No    Review of Systems Constitutional: Negative for fever. Cardiovascular: Negative for chest pain. Respiratory: Negative for shortness of breath. Musculoskeletal: Positive for left lower extremity pain. Skin: Positive for 2 insect bites to his left lower extremity Neurological: Negative for decrease in sensation  ____________________________________________   PHYSICAL EXAM:  VITAL SIGNS: ED Triage Vitals  Enc Vitals Group     BP 08/11/20 1510 128/82     Pulse Rate 08/11/20 1510 (!) 101     Resp 08/11/20 1510 18     Temp --      Temp src --      SpO2 08/11/20 1510 92 %     Weight --      Height --      Head Circumference --      Peak Flow --      Pain Score 08/11/20 1511 6     Pain Loc --      Pain Edu? --      Excl. in [place]? --     Constitutional: Alert and oriented. Well appearing and in no acute distress. Eyes: Conjunctivae are clear without discharge or drainage Head: Atraumatic Neck: Supple. Respiratory: No cough.  Respirations are even and unlabored. Musculoskeletal: Full range of motion of extremities, specifically left lower extremity.  DP and PT pulses 2+ bilaterally. Neurologic: Motor and sensory function intact. Skin: 2 annular erythematous areas consistent with insect bites with surrounding erythema and edema.  Mild nonpitting edema noted at the sites and around the foot and ankle. Psychiatric: Affect and behavior are appropriate.  ____________________________________________   LABS (all labs ordered are listed, but only abnormal results are displayed)  Labs Reviewed - No data to display ____________________________________________  RADIOLOGY    I, Sherrie George, personally viewed and evaluated these  images (plain radiographs) as part of my medical decision making, as well as reviewing the written report by the radiologist.  No results found. ____________________________________________   PROCEDURES  Procedures  ____________________________________________   INITIAL IMPRESSION / ASSESSMENT AND PLAN / ED COURSE  Fernando Vega is a 50 y.o. who presents to the emergency department for treatment and evaluation of left lower extremity tenderness and edema.  See HPI for further details.  On exam, it does appear that he has cellulitis secondary to insect bites.  He will be treated with Bactrim and Naprosyn.  He was encouraged to elevate his foot and ankle anytime he is at rest.  Will apply compression stocking if available.  Otherwise, he states that he has some at home that he may be able to find.  For symptoms that are not improving over the next 2 to 3 days, he is to return to the emergency department since he does not have primary care.  He was provided a list of community resources to establish PCP.  Medications - No data to display  Pertinent labs & imaging results that were available during my care of the patient were reviewed by me and considered in my medical decision making (see chart for details).   _________________________________________   FINAL CLINICAL IMPRESSION(S) / ED DIAGNOSES  Final diagnoses:  Cellulitis of left lower extremity    ED Discharge Orders         Ordered    naproxen (NAPROSYN) 500 MG tablet  2 times daily with meals        08/11/20 1649    sulfamethoxazole-trimethoprim (BACTRIM DS) 800-160 MG tablet  2 times daily        08/11/20 1649           If controlled substance prescribed during this visit, 12 month history viewed on the [place] prior to issuing an initial prescription for Schedule II or III opiod.   Victorino Dike, FNP 08/11/20 1700    Vanessa Buffalo, MD 08/11/20 [phone]

## 2020-08-11 NOTE — ED Notes (Signed)
TED hose applied. Patient verbalized understanding of applying them at home. Demonstration provided. Patient ambulatory with steady gait to POV.

## 2020-11-01 ENCOUNTER — Encounter: Payer: Self-pay | Admitting: Emergency Medicine

## 2020-11-01 ENCOUNTER — Inpatient Hospital Stay: Payer: Medicaid Other | Admitting: Anesthesiology

## 2020-11-01 ENCOUNTER — Other Ambulatory Visit: Payer: Self-pay

## 2020-11-01 ENCOUNTER — Encounter
Admission: EM | Disposition: A | Discharge status: Home / Self Care | Payer: Self-pay | Source: Home / Self Care | Attending: Internal Medicine

## 2020-11-01 ENCOUNTER — Inpatient Hospital Stay
Admission: EM | Admit: 2020-11-01 | Discharge: 2020-11-16 | DRG: 240 | Disposition: A | Discharge status: Home / Self Care | Payer: Medicaid Other | Attending: Internal Medicine | Admitting: Internal Medicine

## 2020-11-01 ENCOUNTER — Inpatient Hospital Stay: Payer: Medicaid Other

## 2020-11-01 ENCOUNTER — Emergency Department: Payer: Medicaid Other

## 2020-11-01 DIAGNOSIS — E1169 Type 2 diabetes mellitus with other specified complication: Secondary | ICD-10-CM | POA: Diagnosis present

## 2020-11-01 DIAGNOSIS — B9561 Methicillin susceptible Staphylococcus aureus infection as the cause of diseases classified elsewhere: Secondary | ICD-10-CM | POA: Diagnosis present

## 2020-11-01 DIAGNOSIS — D638 Anemia in other chronic diseases classified elsewhere: Secondary | ICD-10-CM

## 2020-11-01 DIAGNOSIS — K429 Umbilical hernia without obstruction or gangrene: Secondary | ICD-10-CM | POA: Diagnosis present

## 2020-11-01 DIAGNOSIS — M7989 Other specified soft tissue disorders: Secondary | ICD-10-CM | POA: Diagnosis not present

## 2020-11-01 DIAGNOSIS — G4730 Sleep apnea, unspecified: Secondary | ICD-10-CM | POA: Diagnosis present

## 2020-11-01 DIAGNOSIS — W458XXA Other foreign body or object entering through skin, initial encounter: Secondary | ICD-10-CM | POA: Diagnosis present

## 2020-11-01 DIAGNOSIS — E11628 Type 2 diabetes mellitus with other skin complications: Secondary | ICD-10-CM | POA: Diagnosis present

## 2020-11-01 DIAGNOSIS — E1142 Type 2 diabetes mellitus with diabetic polyneuropathy: Secondary | ICD-10-CM | POA: Diagnosis present

## 2020-11-01 DIAGNOSIS — E1152 Type 2 diabetes mellitus with diabetic peripheral angiopathy with gangrene: Secondary | ICD-10-CM | POA: Diagnosis present

## 2020-11-01 DIAGNOSIS — D649 Anemia, unspecified: Secondary | ICD-10-CM | POA: Diagnosis not present

## 2020-11-01 DIAGNOSIS — Z09 Encounter for follow-up examination after completed treatment for conditions other than malignant neoplasm: Secondary | ICD-10-CM

## 2020-11-01 DIAGNOSIS — E1165 Type 2 diabetes mellitus with hyperglycemia: Secondary | ICD-10-CM | POA: Diagnosis present

## 2020-11-01 DIAGNOSIS — D869 Sarcoidosis, unspecified: Secondary | ICD-10-CM | POA: Diagnosis present

## 2020-11-01 DIAGNOSIS — L0390 Cellulitis, unspecified: Secondary | ICD-10-CM

## 2020-11-01 DIAGNOSIS — L089 Local infection of the skin and subcutaneous tissue, unspecified: Secondary | ICD-10-CM

## 2020-11-01 DIAGNOSIS — D72828 Other elevated white blood cell count: Secondary | ICD-10-CM | POA: Diagnosis not present

## 2020-11-01 DIAGNOSIS — D72829 Elevated white blood cell count, unspecified: Secondary | ICD-10-CM

## 2020-11-01 DIAGNOSIS — Z23 Encounter for immunization: Secondary | ICD-10-CM | POA: Diagnosis not present

## 2020-11-01 DIAGNOSIS — Z9114 Patient's other noncompliance with medication regimen: Secondary | ICD-10-CM

## 2020-11-01 DIAGNOSIS — Z20822 Contact with and (suspected) exposure to covid-19: Secondary | ICD-10-CM | POA: Diagnosis present

## 2020-11-01 DIAGNOSIS — T148XXA Other injury of unspecified body region, initial encounter: Secondary | ICD-10-CM | POA: Diagnosis present

## 2020-11-01 DIAGNOSIS — D62 Acute posthemorrhagic anemia: Secondary | ICD-10-CM | POA: Diagnosis not present

## 2020-11-01 DIAGNOSIS — L03115 Cellulitis of right lower limb: Secondary | ICD-10-CM | POA: Diagnosis present

## 2020-11-01 DIAGNOSIS — S91331A Puncture wound without foreign body, right foot, initial encounter: Secondary | ICD-10-CM | POA: Diagnosis present

## 2020-11-01 DIAGNOSIS — M8610 Other acute osteomyelitis, unspecified site: Secondary | ICD-10-CM | POA: Diagnosis not present

## 2020-11-01 DIAGNOSIS — L02611 Cutaneous abscess of right foot: Secondary | ICD-10-CM | POA: Diagnosis present

## 2020-11-01 HISTORY — PX: INCISION AND DRAINAGE: SHX5863

## 2020-11-01 LAB — CBC WITH DIFFERENTIAL/PLATELET
Abs Immature Granulocytes: 0.15 10*3/uL — ABNORMAL HIGH (ref 0.00–0.07)
Basophils Absolute: 0.1 10*3/uL (ref 0.0–0.1)
Basophils Relative: 0 %
Eosinophils Absolute: 0.1 10*3/uL (ref 0.0–0.5)
Eosinophils Relative: 0 %
HCT: 37.2 % — ABNORMAL LOW (ref 39.0–52.0)
Hemoglobin: 13 g/dL (ref 13.0–17.0)
Immature Granulocytes: 1 %
Lymphocytes Relative: 11 %
Lymphs Abs: 2.1 10*3/uL (ref 0.7–4.0)
MCH: 32.3 pg (ref 26.0–34.0)
MCHC: 34.9 g/dL (ref 30.0–36.0)
MCV: 92.5 fL (ref 80.0–100.0)
Monocytes Absolute: 1.9 10*3/uL — ABNORMAL HIGH (ref 0.1–1.0)
Monocytes Relative: 11 %
Neutro Abs: 13.9 10*3/uL — ABNORMAL HIGH (ref 1.7–7.7)
Neutrophils Relative %: 77 %
Platelets: 202 10*3/uL (ref 150–400)
RBC: 4.02 MIL/uL — ABNORMAL LOW (ref 4.22–5.81)
RDW: 11.9 % (ref 11.5–15.5)
WBC: 18.2 10*3/uL — ABNORMAL HIGH (ref 4.0–10.5)
nRBC: 0 % (ref 0.0–0.2)

## 2020-11-01 LAB — PREALBUMIN: Prealbumin: 13.3 mg/dL — ABNORMAL LOW (ref 18–38)

## 2020-11-01 LAB — COMPREHENSIVE METABOLIC PANEL
ALT: 12 U/L (ref 0–44)
AST: 11 U/L — ABNORMAL LOW (ref 15–41)
Albumin: 3.1 g/dL — ABNORMAL LOW (ref 3.5–5.0)
Alkaline Phosphatase: 74 U/L (ref 38–126)
Anion gap: 11 (ref 5–15)
BUN: 14 mg/dL (ref 6–20)
CO2: 25 mmol/L (ref 22–32)
Calcium: 8.4 mg/dL — ABNORMAL LOW (ref 8.9–10.3)
Chloride: 98 mmol/L (ref 98–111)
Creatinine, Ser: 0.83 mg/dL (ref 0.61–1.24)
GFR, Estimated: >60 mL/min (ref >60)
Glucose, Bld: 278 mg/dL — ABNORMAL HIGH (ref 70–99)
Potassium: 3.9 mmol/L (ref 3.5–5.1)
Sodium: 134 mmol/L — ABNORMAL LOW (ref 135–145)
Total Bilirubin: 1.6 mg/dL — ABNORMAL HIGH (ref 0.3–1.2)
Total Protein: 6.2 g/dL — ABNORMAL LOW (ref 6.5–8.1)

## 2020-11-01 LAB — URINALYSIS, COMPLETE (UACMP) WITH MICROSCOPIC
Bacteria, UA: NONE SEEN
Bilirubin Urine: NEGATIVE
Glucose, UA: 500 mg/dL — AB
Ketones, ur: 80 mg/dL — AB
Leukocytes,Ua: NEGATIVE
Nitrite: NEGATIVE
Protein, ur: NEGATIVE mg/dL
Specific Gravity, Urine: 1.025 (ref 1.005–1.030)
pH: 5.5 (ref 5.0–8.0)

## 2020-11-01 LAB — RESP PANEL BY RT-PCR (FLU A&B, COVID) ARPGX2
Influenza A by PCR: NEGATIVE
Influenza B by PCR: NEGATIVE
SARS Coronavirus 2 by RT PCR: NEGATIVE

## 2020-11-01 LAB — CBG MONITORING, ED
Glucose-Capillary: 192 mg/dL — ABNORMAL HIGH (ref 70–99)
Glucose-Capillary: 161 mg/dL — ABNORMAL HIGH (ref 70–99)
Glucose-Capillary: 153 mg/dL — ABNORMAL HIGH (ref 70–99)

## 2020-11-01 LAB — HEMOGLOBIN A1C
Hgb A1c MFr Bld: 11.8 % — ABNORMAL HIGH (ref 4.8–5.6)
Mean Plasma Glucose: 291.96 mg/dL

## 2020-11-01 LAB — LACTIC ACID, PLASMA: Lactic Acid, Venous: 1.3 mmol/L (ref 0.5–1.9)

## 2020-11-01 LAB — GLUCOSE, CAPILLARY: Glucose-Capillary: 174 mg/dL — ABNORMAL HIGH (ref 70–99)

## 2020-11-01 LAB — APTT: aPTT: 31 seconds (ref 24–36)

## 2020-11-01 LAB — HIV ANTIBODY (ROUTINE TESTING W REFLEX): HIV Screen 4th Generation wRfx: NONREACTIVE

## 2020-11-01 LAB — SEDIMENTATION RATE: Sed Rate: 84 mm/hr — ABNORMAL HIGH (ref 0–20)

## 2020-11-01 LAB — PROTIME-INR
INR: 1.1 (ref 0.8–1.2)
Prothrombin Time: 14 seconds (ref 11.4–15.2)

## 2020-11-01 LAB — C-REACTIVE PROTEIN: CRP: 37.9 mg/dL — ABNORMAL HIGH (ref <1.0)

## 2020-11-01 SURGERY — INCISION AND DRAINAGE
Anesthesia: General | Site: Foot | Laterality: Right

## 2020-11-01 MED ORDER — SODIUM CHLORIDE 0.9 % IV BOLUS
1000.0000 mL | Freq: Once | INTRAVENOUS | Status: AC
  Administered 2020-11-01: 1000 mL via INTRAVENOUS

## 2020-11-01 MED ORDER — INSULIN ASPART 100 UNIT/ML IJ SOLN
0.0000 [IU] | INTRAMUSCULAR | Status: DC
  Administered 2020-11-01 (×2): 3 [IU] via SUBCUTANEOUS
  Administered 2020-11-02: 8 [IU] via SUBCUTANEOUS
  Administered 2020-11-02: 15 [IU] via SUBCUTANEOUS
  Administered 2020-11-02 (×4): 8 [IU] via SUBCUTANEOUS
  Administered 2020-11-03: 11 [IU] via SUBCUTANEOUS
  Administered 2020-11-03: 5 [IU] via SUBCUTANEOUS
  Administered 2020-11-03: 3 [IU] via SUBCUTANEOUS
  Administered 2020-11-03: 8 [IU] via SUBCUTANEOUS
  Administered 2020-11-03: 11 [IU] via SUBCUTANEOUS
  Administered 2020-11-03: 5 [IU] via SUBCUTANEOUS
  Administered 2020-11-04: 3 [IU] via SUBCUTANEOUS
  Administered 2020-11-04: 5 [IU] via SUBCUTANEOUS
  Administered 2020-11-04: 3 [IU] via SUBCUTANEOUS
  Administered 2020-11-04 (×2): 5 [IU] via SUBCUTANEOUS
  Administered 2020-11-04: 8 [IU] via SUBCUTANEOUS
  Administered 2020-11-04 – 2020-11-05 (×2): 3 [IU] via SUBCUTANEOUS
  Administered 2020-11-05 (×2): 5 [IU] via SUBCUTANEOUS
  Administered 2020-11-05: 2 [IU] via SUBCUTANEOUS
  Administered 2020-11-06: 5 [IU] via SUBCUTANEOUS
  Administered 2020-11-06: 3 [IU] via SUBCUTANEOUS
  Administered 2020-11-06 (×3): 5 [IU] via SUBCUTANEOUS
  Administered 2020-11-07 (×3): 3 [IU] via SUBCUTANEOUS
  Administered 2020-11-07 (×3): 5 [IU] via SUBCUTANEOUS
  Administered 2020-11-08: 2 [IU] via SUBCUTANEOUS
  Administered 2020-11-08: 3 [IU] via SUBCUTANEOUS
  Administered 2020-11-08 (×2): 5 [IU] via SUBCUTANEOUS
  Administered 2020-11-08: 3 [IU] via SUBCUTANEOUS
  Administered 2020-11-08: 8 [IU] via SUBCUTANEOUS
  Administered 2020-11-09: 3 [IU] via SUBCUTANEOUS
  Administered 2020-11-09 – 2020-11-10 (×4): 5 [IU] via SUBCUTANEOUS
  Administered 2020-11-10 (×2): 3 [IU] via SUBCUTANEOUS
  Filled 2020-11-01 (×50): qty 1

## 2020-11-01 MED ORDER — PROPOFOL 10 MG/ML IV BOLUS
INTRAVENOUS | Status: DC | PRN
Start: 2020-11-01 — End: 2020-11-01
  Administered 2020-11-01: 120 mg via INTRAVENOUS

## 2020-11-01 MED ORDER — VASOPRESSIN 20 UNIT/ML IV SOLN
INTRAVENOUS | Status: DC | PRN
  Administered 2020-11-01: 2 [IU] via INTRAVENOUS

## 2020-11-01 MED ORDER — CHLORHEXIDINE GLUCONATE 0.12 % MT SOLN: 15.0000 mL | Freq: Once | OROMUCOSAL | Status: AC

## 2020-11-01 MED ORDER — ONDANSETRON HCL 4 MG PO TABS
4.0000 mg | ORAL_TABLET | Freq: Four times a day (QID) | ORAL | Status: DC | PRN
  Administered 2020-11-10: 4 mg via ORAL
  Filled 2020-11-01: qty 1

## 2020-11-01 MED ORDER — ACETAMINOPHEN 10 MG/ML IV SOLN
INTRAVENOUS | Status: DC | PRN
  Administered 2020-11-01: 1000 mg via INTRAVENOUS

## 2020-11-01 MED ORDER — CHLORHEXIDINE GLUCONATE 0.12 % MT SOLN
OROMUCOSAL | Status: AC
  Administered 2020-11-01: 15 mL
  Filled 2020-11-01: qty 15

## 2020-11-01 MED ORDER — PROMETHAZINE HCL 25 MG/ML IJ SOLN: 6.2500 mg | INTRAMUSCULAR | Status: DC | PRN

## 2020-11-01 MED ORDER — ONDANSETRON HCL 4 MG/2ML IJ SOLN: 4.0000 mg | Freq: Four times a day (QID) | INTRAMUSCULAR | Status: DC | PRN

## 2020-11-01 MED ORDER — ALBUTEROL SULFATE (2.5 MG/3ML) 0.083% IN NEBU: 3.0000 mL | INHALATION_SOLUTION | Freq: Four times a day (QID) | RESPIRATORY_TRACT | Status: DC | PRN

## 2020-11-01 MED ORDER — PHENYLEPHRINE HCL (PRESSORS) 10 MG/ML IV SOLN
INTRAVENOUS | Status: DC | PRN
  Administered 2020-11-01 (×2): 200 ug via INTRAVENOUS

## 2020-11-01 MED ORDER — LIDOCAINE HCL (CARDIAC) PF 100 MG/5ML IV SOSY
PREFILLED_SYRINGE | INTRAVENOUS | Status: DC | PRN
  Administered 2020-11-01: 100 mg via INTRAVENOUS

## 2020-11-01 MED ORDER — PIPERACILLIN-TAZOBACTAM 3.375 G IVPB 30 MIN
3.3750 g | Freq: Once | INTRAVENOUS | Status: AC
  Administered 2020-11-01: 3.375 g via INTRAVENOUS
  Filled 2020-11-01: qty 50

## 2020-11-01 MED ORDER — GLYCOPYRROLATE 0.2 MG/ML IJ SOLN
INTRAMUSCULAR | Status: DC | PRN
  Administered 2020-11-01: .2 mg via INTRAVENOUS

## 2020-11-01 MED ORDER — VANCOMYCIN HCL 1250 MG/250ML IV SOLN
1250.0000 mg | Freq: Two times a day (BID) | INTRAVENOUS | Status: DC
  Administered 2020-11-02 – 2020-11-04 (×6): 1250 mg via INTRAVENOUS
  Filled 2020-11-01 (×7): qty 250

## 2020-11-01 MED ORDER — MORPHINE SULFATE (PF) 2 MG/ML IV SOLN
2.0000 mg | INTRAVENOUS | Status: DC | PRN
Start: 2020-11-01 — End: 2020-11-02
  Administered 2020-11-01: 2 mg via INTRAVENOUS
  Filled 2020-11-01: qty 1

## 2020-11-01 MED ORDER — SODIUM CHLORIDE 0.9 % IR SOLN
Status: DC | PRN
  Administered 2020-11-01: 3000 mL

## 2020-11-01 MED ORDER — CLINDAMYCIN PHOSPHATE 600 MG/50ML IV SOLN
600.0000 mg | Freq: Once | INTRAVENOUS | Status: AC
  Administered 2020-11-01: 600 mg via INTRAVENOUS
  Filled 2020-11-01: qty 50

## 2020-11-01 MED ORDER — SODIUM CHLORIDE 0.9 % IV SOLN
2.0000 g | Freq: Three times a day (TID) | INTRAVENOUS | Status: DC
  Administered 2020-11-01 – 2020-11-03 (×7): 2 g via INTRAVENOUS
  Filled 2020-11-01 (×10): qty 2

## 2020-11-01 MED ORDER — DEXAMETHASONE SODIUM PHOSPHATE 10 MG/ML IJ SOLN
INTRAMUSCULAR | Status: DC | PRN
  Administered 2020-11-01: 5 mg via INTRAVENOUS

## 2020-11-01 MED ORDER — TETANUS-DIPHTH-ACELL PERTUSSIS 5-2.5-18.5 LF-MCG/0.5 IM SUSY
0.5000 mL | PREFILLED_SYRINGE | Freq: Once | INTRAMUSCULAR | Status: AC
  Administered 2020-11-01: 0.5 mL via INTRAMUSCULAR
  Filled 2020-11-01: qty 0.5

## 2020-11-01 MED ORDER — METRONIDAZOLE 500 MG/100ML IV SOLN
500.0000 mg | Freq: Three times a day (TID) | INTRAVENOUS | Status: DC
  Administered 2020-11-01 (×2): 500 mg via INTRAVENOUS
  Filled 2020-11-01 (×4): qty 100

## 2020-11-01 MED ORDER — MIDAZOLAM HCL 2 MG/2ML IJ SOLN
INTRAMUSCULAR | Status: AC
  Filled 2020-11-01: qty 2

## 2020-11-01 MED ORDER — BUPIVACAINE HCL 0.5 % IJ SOLN
INTRAMUSCULAR | Status: DC | PRN
  Administered 2020-11-01: 10 mL

## 2020-11-01 MED ORDER — GADOBUTROL 1 MMOL/ML IV SOLN
8.0000 mL | Freq: Once | INTRAVENOUS | Status: AC | PRN
  Administered 2020-11-01: 8 mL via INTRAVENOUS
  Filled 2020-11-01: qty 8

## 2020-11-01 MED ORDER — FENTANYL CITRATE (PF) 100 MCG/2ML IJ SOLN: 25.0000 ug | INTRAMUSCULAR | Status: DC | PRN

## 2020-11-01 MED ORDER — SODIUM CHLORIDE FLUSH 0.9 % IV SOLN
INTRAVENOUS | Status: AC
  Filled 2020-11-01: qty 10

## 2020-11-01 MED ORDER — SODIUM CHLORIDE 0.9 % IV SOLN: INTRAVENOUS | Status: DC

## 2020-11-01 MED ORDER — MIDAZOLAM HCL 2 MG/2ML IJ SOLN
INTRAMUSCULAR | Status: DC | PRN
  Administered 2020-11-01: 2 mg via INTRAVENOUS

## 2020-11-01 MED ORDER — VANCOMYCIN HCL 2000 MG/400ML IV SOLN
2000.0000 mg | Freq: Once | INTRAVENOUS | Status: AC
  Administered 2020-11-01: 2000 mg via INTRAVENOUS
  Filled 2020-11-01: qty 400

## 2020-11-01 MED ORDER — VANCOMYCIN HCL 1 G IV SOLR
INTRAVENOUS | Status: DC | PRN
  Administered 2020-11-01: 1000 mg via TOPICAL

## 2020-11-01 MED ORDER — METRONIDAZOLE 500 MG/100ML IV SOLN
500.0000 mg | Freq: Two times a day (BID) | INTRAVENOUS | Status: DC
  Administered 2020-11-02 – 2020-11-03 (×4): 500 mg via INTRAVENOUS
  Filled 2020-11-01 (×5): qty 100

## 2020-11-01 MED ORDER — ONDANSETRON HCL 4 MG/2ML IJ SOLN
INTRAMUSCULAR | Status: DC | PRN
  Administered 2020-11-01: 4 mg via INTRAVENOUS

## 2020-11-01 MED ORDER — FENTANYL CITRATE (PF) 100 MCG/2ML IJ SOLN
INTRAMUSCULAR | Status: AC
  Filled 2020-11-01: qty 2

## 2020-11-01 MED ORDER — 0.9 % SODIUM CHLORIDE (POUR BTL) OPTIME
TOPICAL | Status: DC | PRN
  Administered 2020-11-01: 500 mL

## 2020-11-01 SURGICAL SUPPLY — 63 items
BLADE OSC/SAGITTAL MD 5.5X18 (BLADE) IMPLANT
BLADE OSCILLATING/SAGITTAL (BLADE)
BLADE SURG 15 STRL LF DISP TIS (BLADE) ×1 IMPLANT
BLADE SURG 15 STRL SS (BLADE) ×1
BLADE SW THK.38XMED LNG THN (BLADE) IMPLANT
BNDG COHESIVE 4X5 TAN ST LF (GAUZE/BANDAGES/DRESSINGS) ×2 IMPLANT
BNDG COHESIVE 6X5 TAN ST LF (GAUZE/BANDAGES/DRESSINGS) ×2 IMPLANT
BNDG CONFORM 2 STRL LF (GAUZE/BANDAGES/DRESSINGS) ×2 IMPLANT
BNDG CONFORM 3 STRL LF (GAUZE/BANDAGES/DRESSINGS) ×2 IMPLANT
BNDG ELASTIC 4X5.8 VLCR STR LF (GAUZE/BANDAGES/DRESSINGS) ×2 IMPLANT
BNDG ESMARK 4X12 TAN STRL LF (GAUZE/BANDAGES/DRESSINGS) ×2 IMPLANT
BNDG GAUZE ELAST 4 BULKY (GAUZE/BANDAGES/DRESSINGS) ×2 IMPLANT
BNDG STRETCH 4X75 STRL LF (GAUZE/BANDAGES/DRESSINGS) ×2 IMPLANT
CANISTER SUCT 1200ML W/VALVE (MISCELLANEOUS) ×2 IMPLANT
CANISTER WOUND CARE 500ML ATS (WOUND CARE) ×2 IMPLANT
CUFF TOURN SGL QUICK 12 (TOURNIQUET CUFF) IMPLANT
CUFF TOURN SGL QUICK 18X4 (TOURNIQUET CUFF) IMPLANT
DRAPE FLUOR MINI C-ARM 54X84 (DRAPES) IMPLANT
DRAPE XRAY CASSETTE 23X24 (DRAPES) IMPLANT
DURAPREP 26ML APPLICATOR (WOUND CARE) ×2 IMPLANT
ELECT REM PT RETURN 9FT ADLT (ELECTROSURGICAL) ×2
ELECTRODE REM PT RTRN 9FT ADLT (ELECTROSURGICAL) ×1 IMPLANT
GAUZE 4X4 16PLY ~~LOC~~+RFID DBL (SPONGE) ×2 IMPLANT
GAUZE PACKING 1/4 X5 YD (GAUZE/BANDAGES/DRESSINGS) ×2 IMPLANT
GAUZE PACKING IODOFORM 1X5 (PACKING) ×2 IMPLANT
GAUZE SPONGE 4X4 12PLY STRL (GAUZE/BANDAGES/DRESSINGS) ×2 IMPLANT
GAUZE XEROFORM 1X8 LF (GAUZE/BANDAGES/DRESSINGS) ×2 IMPLANT
GLOVE SURG ENC MOIS LTX SZ7 (GLOVE) ×4 IMPLANT
GOWN STRL REUS W/ TWL XL LVL3 (GOWN DISPOSABLE) ×2 IMPLANT
GOWN STRL REUS W/TWL MED LVL3 (GOWN DISPOSABLE) IMPLANT
GOWN STRL REUS W/TWL XL LVL3 (GOWN DISPOSABLE) ×2
HANDPIECE VERSAJET DEBRIDEMENT (MISCELLANEOUS) IMPLANT
IV NS 1000ML (IV SOLUTION) ×1
IV NS 1000ML BAXH (IV SOLUTION) ×1 IMPLANT
KIT DRSG VAC SLVR GRANUFM (MISCELLANEOUS) ×2 IMPLANT
KIT TURNOVER KIT A (KITS) ×2 IMPLANT
LABEL OR SOLS (LABEL) ×2 IMPLANT
MANIFOLD NEPTUNE II (INSTRUMENTS) ×2 IMPLANT
NEEDLE FILTER BLUNT 18X 1/2SAF (NEEDLE) ×1
NEEDLE FILTER BLUNT 18X1 1/2 (NEEDLE) ×1 IMPLANT
NEEDLE HYPO 25X1 1.5 SAFETY (NEEDLE) ×2 IMPLANT
NS IRRIG 500ML POUR BTL (IV SOLUTION) ×2 IMPLANT
PACK EXTREMITY ARMC (MISCELLANEOUS) ×2 IMPLANT
PAD ABD DERMACEA PRESS 5X9 (GAUZE/BANDAGES/DRESSINGS) ×2 IMPLANT
PENCIL ELECTRO HAND CTR (MISCELLANEOUS) ×2 IMPLANT
PULSAVAC PLUS IRRIG FAN TIP (DISPOSABLE) ×4
RASP SM TEAR CROSS CUT (RASP) IMPLANT
SHIELD FULL FACE ANTIFOG 7M (MISCELLANEOUS) ×2 IMPLANT
STOCKINETTE IMPERVIOUS 9X36 MD (GAUZE/BANDAGES/DRESSINGS) ×2 IMPLANT
SUT ETHILON 2 0 FS 18 (SUTURE) ×4 IMPLANT
SUT ETHILON 4-0 (SUTURE) ×1
SUT ETHILON 4-0 FS2 18XMFL BLK (SUTURE) ×1
SUT PROLENE 3 0 PS 2 (SUTURE) ×2 IMPLANT
SUT VIC AB 3-0 SH 27 (SUTURE) ×1
SUT VIC AB 3-0 SH 27X BRD (SUTURE) ×1 IMPLANT
SUT VIC AB 4-0 FS2 27 (SUTURE) ×2 IMPLANT
SUTURE ETHLN 4-0 FS2 18XMF BLK (SUTURE) ×1 IMPLANT
SWAB CULTURE AMIES ANAERIB BLU (MISCELLANEOUS) IMPLANT
SYR 10ML LL (SYRINGE) ×2 IMPLANT
SYR 3ML LL SCALE MARK (SYRINGE) ×2 IMPLANT
TIP FAN IRRIG PULSAVAC PLUS (DISPOSABLE) ×2 IMPLANT
UNDERPAD 30X36 HEAVY ABSORB (UNDERPADS AND DIAPERS) ×2 IMPLANT
WATER STERILE IRR 500ML POUR (IV SOLUTION) ×2 IMPLANT

## 2020-11-01 NOTE — Progress Notes (Signed)
Patient wife updated that patient is doing well, and is going to room 143 on the 1st floor.

## 2020-11-01 NOTE — ED Notes (Signed)
Pt given lemon glycerin swabs to moisten his mouth

## 2020-11-01 NOTE — Transfer of Care (Signed)
Immediate Anesthesia Transfer of Care Note  Patient: Fernando Vega  Procedure(s) Performed: INCISION AND DRAINAGE-Right Foot (Right: Foot)  Patient Location: PACU  Anesthesia Type:General  Level of Consciousness: drowsy and patient cooperative  Airway & Oxygen Therapy: Patient Spontanous Breathing and Patient connected to face mask oxygen  Post-op Assessment: Report given to RN and Post -op Vital signs reviewed and stable  Post vital signs: Reviewed and stable  Last Vitals:  Vitals Value Taken Time  BP 126/73 11/01/20 1748  Temp    Pulse 91 11/01/20 1749  Resp 16 11/01/20 1749  SpO2 100 % 11/01/20 1749  Vitals shown include unvalidated device data.  Last Pain:  Vitals:   11/01/20 1608  TempSrc: Oral  PainSc: 0-No pain         Complications: No notable events documented.

## 2020-11-01 NOTE — ED Triage Notes (Signed)
Pt comes into the ED via POV c/o foot injury from where he stepped on a screw and it went through his shoe 3 days ago.  Pt does admit to neuropathy in his feet and not checking his CBG's at home.

## 2020-11-01 NOTE — Op Note (Signed)
Patient Name: Fernando Vega DOB: [birth year]  MRN: [id number]   Date of Service: 11/01/2020  Surgeon: Dr. Lanae Crumbly, DPM Assistants: None Pre-operative Diagnosis:  Puncture wound with diabetic foot infection right foot Post-operative Diagnosis:  Puncture wound with diabetic foot infection right foot Procedures:  1) incision and drainage of abscess in multiple planes right foot Pathology/Specimens: ID Type Source Tests Collected by Time Destination  A : deep wound culture 2nd toe right foot Tissue PATH Other AEROBIC/ANAEROBIC CULTURE W GRAM STAIN (SURGICAL/DEEP WOUND) Criselda Peaches, DPM 11/01/2020 1728    Anesthesia: General anesthesia Hemostasis:  Total Tourniquet Time Documented: Calf (Right) - 12 minutes Total: Calf (Right) - 12 minutes  Estimated Blood Loss: Minimal Materials: * No implants in log * Medications: 10 cc 0.5% bupivacaine plain Complications: None  Indications for Procedure:  This is a 50 y.o. 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  He noted a small puncture wound in the right foot from Friday but did not notice until Saturday.  He presented to the ER today.  I recommended incision and drainage in the ER.  He had an MRI today.   Procedure in Detail: Patient was identified in pre-operative holding area. Formal consent was signed and the right lower extremity was marked. Patient was brought back to the operating room. Anesthesia was induced. The extremity was prepped and draped in the usual sterile fashion. Timeout was taken to confirm patient name, laterality, and procedure prior to incision.   Attention was then directed to the right second toe where a large necrotic puncture wound was noted.  I made an incision through this and debrided the wound of all necrotic tissues.  I explored in multiple planes including the superficial and deep compartments down to the level  of the tendon.  There is no joint exposure.  Minimal purulence was found there was some dishwater type drainage.  I irrigated thoroughly with 3 L normal sterile saline and then instilled 250 mg of vancomycin powder into the wound.  It was packed open and left with secondary retention sutures of 3-0 Prolene.  This will be a staged procedure with return for further incision and drainage versus amputation with delayed primary closure.  The foot was then dressed with packing 4 x 4 gauze Kerlix and an Ace wrap. Patient tolerated the procedure well.   Disposition: Following a period of post-operative monitoring, patient will be transferred to the floor.

## 2020-11-01 NOTE — ED Notes (Signed)
Patient to the OR via stretcher, IV antibiotics are infusing.  All clothing removed and placed in hospital gown, wife has all personal belongings.

## 2020-11-01 NOTE — H&P (Signed)
History and Physical    Fernando Vega [id number] DOB: 10/26/1970 DOA: 11/01/2020  PCP: Patient, No Pcp Per (Inactive)   Patient coming from: Home  I have personally briefly reviewed patient's old medical records in Fernando [facility]ega  Chief Complaint: Injury to right second toe  HPI: Fernando Vega is a 50 y.o. male with medical history significant for diabetes mellitus with complications of peripheral neuropathy who presents to the emergency room for evaluation of a foot injury which happened about 3 days prior to his presentation. Patient states that he was at a junk yard and probably stepped on a screw that went through his shoes.  He did not feel any pain at the time and only noticed he was injured after he got home and saw blood draining from his right foot after taking a shower.  He states that his son recommended cleaning the foot with peroxide and Epsom salt which he did and he wrapped it up but he noticed that over the last couple of days its had an increased foul-smelling drainage. He has a history of diabetes mellitus and is supposed to be on metformin but he does not take it because it causes diarrhea. He denies having any fever or chills, no chest pain, no shortness of breath, no abdominal pain, no nausea, no vomiting, no changes in his bowel habits, no dizziness, no lightheadedness, no headache, no palpitations or diaphoresis. Labs show sodium 134, potassium 3.9, chloride 98, bicarb 25, glucose 278, BUN 14, creatinine 0.83, calcium 8.4, alkaline phosphatase 74, albumin 3.1, AST 11, ALT 12, total protein 6.2, total bilirubin 1.8, lactic acid 1.3, white count 18.2, hemoglobin 13, hematocrit 37.3, MCV 92.5, RDW 11.9, platelet count 202, PT 14.0, INR 1.1 Respiratory viral panel is pending X-ray of the right foot shows soft tissue emphysema at the base of the second digit suggesting necrotizing infection.  No evidence of osteomyelitis.  No opaque foreign body.  ED Course: Patient is a  50 year old male who presents to the ER for evaluation of a wound involving the right second following a puncture injury.  Right second appears discolored and there is purulent drainage from an open spot on the plantar surface of the right foot. He has marked leukocytosis and right foot x-ray shows evidence of necrotizing infection involving the right second toe. Patient received IV clindamycin, Zosyn and vancomycin in the ER as well as a dose of Tdap and will be admitted to the hospital for further evaluation. Podiatry consult has been requested   Review of Systems: As per HPI otherwise all other systems reviewed and negative.    Past Medical History:  Diagnosis Date   Diabetes mellitus without complication ([place])    Neuropathy    Sarcoidosis    Sleep apnea     History reviewed. No pertinent surgical history.   reports that he has never smoked. He has never used smokeless tobacco. He reports that he does not drink alcohol and does not use drugs.  No Known Allergies  Family History  Problem Relation Age of Onset   Hypertension Mother       Prior to Admission medications   Medication Sig Start Date End Date Taking? Authorizing Provider  albuterol (PROVENTIL HFA;VENTOLIN HFA) 108 (90 Base) MCG/ACT inhaler Inhale 2 puffs into the lungs every 6 (six) hours as needed for wheezing or shortness of breath. 05/06/18   Merlyn Lot, MD    Physical Exam: Vitals:   11/01/20 1015 11/01/20 1030 11/01/20 1100 11/01/20 1104  BP: 140/79 126/84 (!) 142/79   Pulse: (!) 103 (!) 102 98   Resp: 17  17   Temp:    98.9 F (37.2 C)  TempSrc:    Oral  SpO2: 92% 96% 98%   Weight:      Height:         Vitals:   11/01/20 1015 11/01/20 1030 11/01/20 1100 11/01/20 1104  BP: 140/79 126/84 (!) 142/79   Pulse: (!) 103 (!) 102 98   Resp: 17  17   Temp:    98.9 F (37.2 C)  TempSrc:    Oral  SpO2: 92% 96% 98%   Weight:      Height:          Constitutional: Alert and oriented x 3 . Not  in any apparent distress HEENT:      Head: Normocephalic and atraumatic.         Eyes: PERLA, EOMI, Conjunctivae are normal. Sclera is non-icteric.       Mouth/Throat: Mucous membranes are moist.       Neck: Supple with no signs of meningismus. Cardiovascular: Tachycardia. No murmurs, gallops, or rubs. 2+ symmetrical distal pulses are present . No JVD. No LE edema Respiratory: Respiratory effort normal .Lungs sounds clear bilaterally. No wheezes, crackles, or rhonchi.  Gastrointestinal: Soft, non tender, and non distended with positive bowel sounds.  Genitourinary: No CVA tenderness. Musculoskeletal: Puncture wound on the plantar surface of the right second toe with discoloration of the right second toe and redness on the dorsum of the right foot Neurologic:  Face is symmetric. Moving all extremities. No gross focal neurologic deficits . Skin: Skin is warm, dry.  No rash or ulcers Psychiatric: Mood and affect are normal    Labs on Admission: I have personally reviewed following labs and imaging studies  CBC: Recent Labs  Lab 11/01/20 0956  WBC 18.2*  NEUTROABS 13.9*  HGB 13.0  HCT 37.2*  MCV 92.5  PLT 123XX123   Basic Metabolic Panel: Recent Labs  Lab 11/01/20 0956  NA 134*  K 3.9  CL 98  CO2 25  GLUCOSE 278*  BUN 14  CREATININE 0.83  CALCIUM 8.4*   GFR: Estimated Creatinine Clearance: 108.8 mL/min (by C-G formula based on SCr of 0.83 mg/dL). Liver Function Tests: Recent Labs  Lab 11/01/20 0956  AST 11*  ALT 12  ALKPHOS 74  BILITOT 1.6*  PROT 6.2*  ALBUMIN 3.1*   No results for input(s): LIPASE, AMYLASE in the last 168 hours. No results for input(s): AMMONIA in the last 168 hours. Coagulation Profile: Recent Labs  Lab 11/01/20 0956  INR 1.1   Cardiac Enzymes: No results for input(s): CKTOTAL, CKMB, CKMBINDEX, TROPONINI in the last 168 hours. BNP (last 3 results) No results for input(s): PROBNP in the last 8760 hours. HbA1C: No results for input(s): HGBA1C  in the last 72 hours. CBG: No results for input(s): GLUCAP in the last 168 hours. Lipid Profile: No results for input(s): CHOL, HDL, LDLCALC, TRIG, CHOLHDL, LDLDIRECT in the last 72 hours. Thyroid Function Tests: No results for input(s): TSH, T4TOTAL, FREET4, T3FREE, THYROIDAB in the last 72 hours. Anemia Panel: No results for input(s): VITAMINB12, FOLATE, FERRITIN, TIBC, IRON, RETICCTPCT in the last 72 hours. Urine analysis:    Component Value Date/Time   COLORURINE YELLOW 11/01/2020 1026   APPEARANCEUR CLEAR 11/01/2020 1026   LABSPEC 1.025 11/01/2020 1026   PHURINE 5.5 11/01/2020 1026   GLUCOSEU 500 (A) 11/01/2020 1026   HGBUR TRACE (  A) 11/01/2020 1026   BILIRUBINUR NEGATIVE 11/01/2020 1026   KETONESUR 80 (A) 11/01/2020 1026   PROTEINUR NEGATIVE 11/01/2020 1026   NITRITE NEGATIVE 11/01/2020 1026   LEUKOCYTESUR NEGATIVE 11/01/2020 1026    Radiological Exams on Admission: DG Foot Complete Right  Result Date: 11/01/2020 CLINICAL DATA:  Injury with blackening of the second toe. Open wound at the second toe base EXAM: RIGHT FOOT COMPLETE - 3+ VIEW COMPARISON:  None. FINDINGS: Soft tissue gas is seen at the base of the second digit. No visible osteomyelitis or septic arthritis. No opaque foreign body. Premature arterial calcification IMPRESSION: Soft tissue emphysema at the base of the second digit suggesting necrotizing infection. No evidence of osteomyelitis. No opaque foreign body. Electronically Signed   By: Monte Fantasia M.D.   On: 11/01/2020 10:09     Assessment/Plan Principal Problem:   Necrotizing soft tissue infection Active Problems:   Puncture wound   Type 2 diabetes mellitus with peripheral neuropathy (HCC)      Necrotizing soft tissue infection involving the right second toe Following a puncture wound X-ray shows soft tissue emphysema at the base of the second digit suggesting necrotizing infection.  No evidence of osteomyelitis.  No opaque foreign body. Will  place patient empirically on antibiotic therapy to cover both MRSA and Pseudomonas, place patient on IV vancomycin, IV cefepime and IV metronidazole Keep patient n.p.o. Podiatry consult     Type 2 diabetes mellitus with peripheral neuropathy Patient has a history of diabetes mellitus but is noncompliant with prescribed therapy due to side effects (stopped taking his metformin due to diarrhea) Last hemoglobin A1c was 12 Repeat hemoglobin A1c Glycemic control with sliding scale Diabetic education  DVT prophylaxis: SCD Code Status: full code  Family Communication: Greater than 50% of time was spent discussing patient's condition and plan of care with him at the bedside.  All questions and concerns have been addressed.  He verbalizes understanding and agrees with the plan. Disposition Plan: Back to previous home environment Consults called: Podiatry Status: At the time of admission, it appears that the appropriate admission status for this patient is inpatient. This is judged to be reasonable and necessary in order to provide the required intensity of service to ensure the patient's safety given the presenting symptoms, physical exam findings, and initial radiographic and laboratory data in the context of their comorbid conditions. Patient requires inpatient status due to high intensity of service, high risk for further deterioration and high frequency of surveillance required.    Collier Bullock MD [facility]     11/01/2020, 12:06 PM

## 2020-11-01 NOTE — Progress Notes (Signed)
Pharmacy Antibiotic Note  Fernando Vega is a 50 y.o. male admitted on 11/01/2020. Patient with PMH diabetes with associated peripheral neuropathy. He presented with injury to foot, suspects he stepped on a screw but did not realize until later in the day when he got home and noted blood draining from foot. He reports increased foul-smelling drainage over the past few days. X-ray shows soft tissue emphysema at the base of the second digit suggesting necrotizing infection; no evidence of osteomyelitis or foreign body. MRI pending. Pharmacy has been consulted for vancomycin and cefepime dosing.  Plan: Vancomycin 2000 mg IV loading dose followed by vancomycin 1250 mg IV q12h  Goal AUC 400-550 Expected AUC: 475 SCr used: 0.83  Cefepime 2 g IV q8h   Height: '5\' 7"'$  (170.2 cm) Weight: 85.7 kg (188 lb 15 oz) IBW/kg (Calculated) : 66.1  Temp (24hrs), Avg:98.8 F (37.1 C), Min:98.6 F (37 C), Max:98.9 F (37.2 C)  Recent Labs  Lab 11/01/20 0956  WBC 18.2*  CREATININE 0.83  LATICACIDVEN 1.3    Estimated Creatinine Clearance: 108.8 mL/min (by C-G formula based on SCr of 0.83 mg/dL).    No Known Allergies  Antimicrobials this admission: Clindamycin 8/22 x 1 Vancomycin 8/22 >> Cefepime 8/22 >>  Microbiology results: 8/22 BCx: pending 8/22 Cx (right foot): pending  Thank you for allowing pharmacy to be a part of this patient's care.  Tawnya Crook, PharmD, BCPS Clinical Pharmacist 11/01/2020 12:37 PM

## 2020-11-01 NOTE — ED Provider Notes (Signed)
[facility] Emergency Department Provider Note  ____________________________________________   Event Date/Time   First MD Initiated Contact with Patient 11/01/20 [phone]     (approximate)  I have reviewed the triage vital signs and the nursing notes.   HISTORY  Chief Complaint Foot Injury    HPI Fernando Vega is a 50 y.o. male presents emergency department complaining of right foot pain.  Patient has history of diabetes and neuropathy.  Patient is not on any medications at this time.  Patient states he stepped on a screw that went through his tennis shoe and into his foot.  States he did not even know what happened until his son mentioned he had blood all over his foot.  Injury happened on Friday.  Patient did clean the area.  Family members put a dressing on it.  States that the toe turned blue and now he has redness of the foot.  Did feel hot and cold like he might have a fever but did not take his temperature.  No vomiting or diarrhea.  No chest pain or shortness of breath.  Unsure of last Tdap  Past Medical History:  Diagnosis Date   Diabetes mellitus without complication ([place])    Neuropathy    Sarcoidosis    Sleep apnea     There are no problems to display for this patient.   History reviewed. No pertinent surgical history.  Prior to Admission medications   Medication Sig Start Date End Date Taking? Authorizing Provider  albuterol (PROVENTIL HFA;VENTOLIN HFA) 108 (90 Base) MCG/ACT inhaler Inhale 2 puffs into the lungs every 6 (six) hours as needed for wheezing or shortness of breath. 05/06/18   Merlyn Lot, MD    Allergies Patient has no known allergies.  History reviewed. No pertinent family history.  Social History Social History   Tobacco Use   Smoking status: Never   Smokeless tobacco: Never  Substance Use Topics   Alcohol use: No   Drug use: No    Review of Systems  Constitutional: No fever/chills Eyes: No visual  changes. ENT: No sore throat. Respiratory: Denies cough Cardiovascular: Denies chest pain Gastrointestinal: Denies abdominal pain Genitourinary: Negative for dysuria. Musculoskeletal: Negative for back pain.  Positive for right foot pain Skin: Negative for rash. Psychiatric: no mood changes,     ____________________________________________   PHYSICAL EXAM:  VITAL SIGNS: ED Triage Vitals  Enc Vitals Group     BP 11/01/20 0916 (!) 174/75     Pulse Rate 11/01/20 0916 100     Resp 11/01/20 0916 18     Temp 11/01/20 0916 98.6 F (37 C)     Temp Source 11/01/20 0916 Oral     SpO2 11/01/20 0916 97 %     Weight 11/01/20 0917 188 lb 15 oz (85.7 kg)     Height 11/01/20 0917 '5\' 7"'$  (1.702 m)     Head Circumference --      Peak Flow --      Pain Score 11/01/20 0917 8     Pain Loc --      Pain Edu? --      Excl. in [place]? --     Constitutional: Alert and oriented. Well appearing and in no acute distress. Eyes: Conjunctivae are normal.  Head: Atraumatic. Nose: No congestion/rhinnorhea. Mouth/Throat: Mucous membranes are moist.   Neck:  supple no lymphadenopathy noted Cardiovascular: Normal rate, regular rhythm. Heart sounds are normal Respiratory: Normal respiratory effort.  No retractions, lungs c t  a  Abd: soft nontender bs normal all 4 quad GU: deferred Musculoskeletal: FROM all extremities, warm and well perfused, right foot has a ulcerated deeper wound on the plantar surface, redness along this area, bruising noted at the base of the toes and into the middle toe.  Cap refill is less than 1 second Neurologic:  Normal speech and language.  Skin:  Skin is warm, dry No rash noted. Psychiatric: Mood and affect are normal. Speech and behavior are normal.  ____________________________________________   LABS (all labs ordered are listed, but only abnormal results are displayed)  Labs Reviewed  CBC WITH DIFFERENTIAL/PLATELET - Abnormal; Notable for the following components:       Result Value   WBC 18.2 (*)    RBC 4.02 (*)    HCT 37.2 (*)    Neutro Abs 13.9 (*)    Monocytes Absolute 1.9 (*)    Abs Immature Granulocytes 0.15 (*)    All other components within normal limits  COMPREHENSIVE METABOLIC PANEL - Abnormal; Notable for the following components:   Sodium 134 (*)    Glucose, Bld 278 (*)    Calcium 8.4 (*)    Total Protein 6.2 (*)    Albumin 3.1 (*)    AST 11 (*)    Total Bilirubin 1.6 (*)    All other components within normal limits  CULTURE, BLOOD (SINGLE)  AEROBIC CULTURE W GRAM STAIN (SUPERFICIAL SPECIMEN)  RESP PANEL BY RT-PCR (FLU A&B, COVID) ARPGX2  LACTIC ACID, PLASMA  PROTIME-INR  APTT  LACTIC ACID, PLASMA  URINALYSIS, COMPLETE (UACMP) WITH MICROSCOPIC  CBG MONITORING, ED   ____________________________________________   ____________________________________________  RADIOLOGY  X-ray of the right foot  ____________________________________________   PROCEDURES  Procedure(s) performed: No  .Critical Care E&M  Date/Time: 11/01/2020 11:22 AM Performed by: Versie Starks, PA-C  Critical care provider statement:    Critical care time (minutes):  30   Critical care time was exclusive of:  Separately billable procedures and treating other patients   Critical care was necessary to treat or prevent imminent or life-threatening deterioration of the following conditions:  Sepsis and circulatory failure   Critical care was time spent personally by me on the following activities:  Blood draw for specimens, development of treatment plan with patient or surrogate, discussions with consultants, examination of patient, obtaining history from patient or surrogate, ordering and performing treatments and interventions, ordering and review of laboratory studies, ordering and review of radiographic studies, review of old charts and re-evaluation of patient's condition After initial E/M assessment, critical care services were subsequently performed  that were exclusive of separately billable procedures or treatment.   Comments:     Necrotizing infection, consulted with podiatry    ____________________________________________   INITIAL IMPRESSION / ASSESSMENT AND PLAN / ED COURSE  Pertinent labs & imaging results that were available during my care of the patient were reviewed by me and considered in my medical decision making (see chart for details).   The patient is 50 year old male presents with right foot injury after stepping on a screw that went through his tennis shoe.  Patient is diabetic and does not take medication.  See HPI physical exam shows patient be stable at this time.  However will work him up for sepsis due to the redness and swelling of the foot that is extending up the dorsal surface.  Nursing staff commented that the drainage on the bandage was purulent and had a bad smell.  DDx: Sepsis, osteomyelitis, diabetic  foot wound, cellulitis, necrotic toe  Labs and imaging ordered for evolving sepsis  CBC has elevated WBC of 18.2, comprehensive metabolic panel has elevated glucose of 278 Lactic acid is normal, PT PTT are normal  X-ray of the right foot reviewed by me confirmed by radiology to have a gas pattern which the radiologist states is concerning for necrotizing infection.  Concerns for osteomyelitis  Consult to podiatry.  Dr. Sherryle Lis will be coming to the ED to see the patient.  Consult hospitalist for admission Dr Francine Graven will be admitting,  Patient to stay NPO, nursing staff and patient aware    Fernando Vega was evaluated in Emergency Department on 11/01/2020 for the symptoms described in the history of present illness. He was evaluated in the context of the global COVID-19 pandemic, which necessitated consideration that the patient might be at risk for infection with the SARS-CoV-2 virus that causes COVID-19. Institutional protocols and algorithms that pertain to the evaluation of patients at risk for  COVID-19 are in a state of rapid change based on information released by regulatory bodies including the CDC and federal and state organizations. These policies and algorithms were followed during the patient's care in the ED.    As part of my medical decision making, I reviewed the following data within the [place] notes reviewed and incorporated, Labs reviewed , Old chart reviewed, Radiograph reviewed , Discussed with admitting physician , A consult was requested and obtained from this/these consultant(s) podiatry, Notes from prior ED visits, and [place] Controlled Substance Database  ____________________________________________   FINAL CLINICAL IMPRESSION(S) / ED DIAGNOSES  Final diagnoses:  Necrotizing cellulitis  Diabetic foot infection ([place])      NEW MEDICATIONS STARTED DURING THIS VISIT:  New Prescriptions   No medications on file     Note:  This document was prepared using Dragon voice recognition software and may include unintentional dictation errors.    Versie Starks, PA-C 11/01/20 1124    Merlyn Lot, MD 11/01/20 [phone]

## 2020-11-01 NOTE — Anesthesia Preprocedure Evaluation (Signed)
Anesthesia Evaluation  Patient identified by MRN, date of birth, ID band Patient awake    Reviewed: Allergy & Precautions, H&P , NPO status , Patient's Chart, lab work & pertinent test results, reviewed documented beta blocker date and time   History of Anesthesia Complications Negative for: history of anesthetic complications  Airway Mallampati: III  TM Distance: >3 FB Neck ROM: full    Dental  (+) Dental Advidsory Given, Edentulous Upper, Edentulous Lower   Pulmonary neg shortness of breath, sleep apnea , neg COPD, neg recent URI,  Sarcoidosis   Pulmonary exam normal breath sounds clear to auscultation       Cardiovascular Exercise Tolerance: Good negative cardio ROS Normal cardiovascular exam Rhythm:regular Rate:Normal     Neuro/Psych negative neurological ROS  negative psych ROS   GI/Hepatic Neg liver ROS, GERD  ,  Endo/Other  diabetes, Poorly Controlled  Renal/GU negative Renal ROS  negative genitourinary   Musculoskeletal   Abdominal   Peds  Hematology negative hematology ROS (+)   Anesthesia Other Findings Past Medical History: No date: Diabetes mellitus without complication (HCC) No date: Neuropathy No date: Sarcoidosis No date: Sleep apnea   Reproductive/Obstetrics negative OB ROS                             Anesthesia Physical Anesthesia Plan  ASA: 3  Anesthesia Plan: General   Post-op Pain Management:    Induction: Intravenous  PONV Risk Score and Plan: 2 and Ondansetron, Dexamethasone, Midazolam and Treatment may vary due to age or medical condition  Airway Management Planned: LMA  Additional Equipment:   Intra-op Plan:   Post-operative Plan: Extubation in OR  Informed Consent: I have reviewed the patients History and Physical, chart, labs and discussed the procedure including the risks, benefits and alternatives for the proposed anesthesia with the patient  or authorized representative who has indicated his/her understanding and acceptance.     Dental Advisory Given  Plan Discussed with: Anesthesiologist, CRNA and Surgeon  Anesthesia Plan Comments:         Anesthesia Quick Evaluation

## 2020-11-01 NOTE — ED Notes (Signed)
See triage note  Presents with injury to right foot  States he stepped on a screw on Friday  Screw went thur his tennis shoe  Open wound noted to bottom of foot   Redness noted to top of foot  Bruising and swelling noted to toe  Wound is draining

## 2020-11-01 NOTE — Brief Op Note (Signed)
11/01/2020  5:49 PM  PATIENT:  [name]  50 y.o. male  PRE-OPERATIVE DIAGNOSIS:  Right Foot Abscess  POST-OPERATIVE DIAGNOSIS:  Right Foot Abscess  PROCEDURE:  Procedure(s): INCISION AND DRAINAGE-Right Foot (Right)  SURGEON:  Surgeon(s) and Role:    * Even Budlong, Stephan Minister, DPM - Primary  PHYSICIAN ASSISTANT:   ASSISTANTS: none   ANESTHESIA:   general  EBL:  minimal   BLOOD ADMINISTERED:none  DRAINS: none   LOCAL MEDICATIONS USED:  MARCAINE    and Amount: 10cc ml  SPECIMEN:  Source of Specimen:  deep wound culture  DISPOSITION OF SPECIMEN:   microbiology  COUNTS:  YES  TOURNIQUET:   Total Tourniquet Time Documented: Calf (Right) - 12 minutes Total: Calf (Right) - 12 minutes   DICTATION: .Note written in EPIC  PLAN OF CARE: Admit to inpatient   PATIENT DISPOSITION:  PACU - hemodynamically stable.   Delay start of Pharmacological VTE agent (>24hrs) due to surgical blood loss or risk of bleeding: no

## 2020-11-01 NOTE — Anesthesia Procedure Notes (Signed)
Procedure Name: LMA Insertion Date/Time: 11/01/2020 5:11 PM Performed by: Kelton Pillar, CRNA Pre-anesthesia Checklist: Patient identified, Emergency Drugs available, Suction available and Patient being monitored Patient Re-evaluated:Patient Re-evaluated prior to induction Oxygen Delivery Method: Circle system utilized Preoxygenation: Pre-oxygenation with 100% oxygen Induction Type: IV induction Ventilation: Mask ventilation without difficulty LMA: LMA flexible inserted LMA Size: 4.0 Placement Confirmation: positive ETCO2, CO2 detector and breath sounds checked- equal and bilateral Tube secured with: Tape Dental Injury: Teeth and Oropharynx as per pre-operative assessment

## 2020-11-01 NOTE — Consult Note (Addendum)
Reason for Consult: Puncture wound with necrotizing diabetic infection right foot Referring Physician: Ashok Cordia, PA  Fernando Vega is an 50 y.o. male.  HPI: 50 year old male with a history of type 2 diabetes poorly controlled (he says he does not see any doctor currently does not take any medications for any of this as he is not insured).  He stepped on a screw on Friday and did not notice until Saturday evening when his son saw blood coming out from the toe.  His daughter wanted him to come to the ER yesterday but he wanted to wait until today.  He has dense neuropathy and does not feel anything in his feet.  He noticed redness and drainage coming out of the foot.  Past Medical History:  Diagnosis Date   Diabetes mellitus without complication ([place])    Neuropathy    Sarcoidosis    Sleep apnea     History reviewed. No pertinent surgical history.  Family History  Problem Relation Age of Onset   Hypertension Mother     Social History:  reports that he has never smoked. He has never used smokeless tobacco. He reports that he does not drink alcohol and does not use drugs.  Allergies: No Known Allergies  Medications: I have reviewed the patient's current medications.  Results for orders placed or performed during the hospital encounter of 11/01/20 (from the past 48 hour(s))  CBC with Differential     Status: Abnormal   Collection Time: 11/01/20  9:56 AM  Result Value Ref Range   WBC 18.2 (H) 4.0 - 10.5 K/uL   RBC 4.02 (L) 4.22 - 5.81 MIL/uL   Hemoglobin 13.0 13.0 - 17.0 g/dL   HCT 37.2 (L) 39.0 - 52.0 %   MCV 92.5 80.0 - 100.0 fL   MCH 32.3 26.0 - 34.0 pg   MCHC 34.9 30.0 - 36.0 g/dL   RDW 11.9 11.5 - 15.5 %   Platelets 202 150 - 400 K/uL   nRBC 0.0 0.0 - 0.2 %   Neutrophils Relative % 77 %   Neutro Abs 13.9 (H) 1.7 - 7.7 K/uL   Lymphocytes Relative 11 %   Lymphs Abs 2.1 0.7 - 4.0 K/uL   Monocytes Relative 11 %   Monocytes Absolute 1.9 (H) 0.1 - 1.0 K/uL   Eosinophils  Relative 0 %   Eosinophils Absolute 0.1 0.0 - 0.5 K/uL   Basophils Relative 0 %   Basophils Absolute 0.1 0.0 - 0.1 K/uL   Immature Granulocytes 1 %   Abs Immature Granulocytes 0.15 (H) 0.00 - 0.07 K/uL    Comment: Performed at [facility], [address] [place], [place] [address]  Lactic acid, plasma     Status: None   Collection Time: 11/01/20  9:56 AM  Result Value Ref Range   Lactic Acid, Venous 1.3 0.5 - 1.9 mmol/L    Comment: Performed at [facility], [address] [place], [place] [address]  Comprehensive metabolic panel     Status: Abnormal   Collection Time: 11/01/20  9:56 AM  Result Value Ref Range   Sodium 134 (L) 135 - 145 mmol/L   Potassium 3.9 3.5 - 5.1 mmol/L   Chloride 98 98 - 111 mmol/L   CO2 25 22 - 32 mmol/L   Glucose, Bld 278 (H) 70 - 99 mg/dL    Comment: Glucose reference range applies only to samples taken after fasting for at least 8 hours.   BUN 14 6 -  20 mg/dL   Creatinine, Ser 0.83 0.61 - 1.24 mg/dL   Calcium 8.4 (L) 8.9 - 10.3 mg/dL   Total Protein 6.2 (L) 6.5 - 8.1 g/dL   Albumin 3.1 (L) 3.5 - 5.0 g/dL   AST 11 (L) 15 - 41 U/L   ALT 12 0 - 44 U/L   Alkaline Phosphatase 74 38 - 126 U/L   Total Bilirubin 1.6 (H) 0.3 - 1.2 mg/dL   GFR, Estimated >60 >60 mL/min    Comment: (NOTE) Calculated using the CKD-EPI Creatinine Equation (2021)    Anion gap 11 5 - 15    Comment: Performed at [facility], [address] [place], [place] [address]  Protime-INR     Status: None   Collection Time: 11/01/20  9:56 AM  Result Value Ref Range   Prothrombin Time 14.0 11.4 - 15.2 seconds   INR 1.1 0.8 - 1.2    Comment: (NOTE) INR goal varies based on device and disease states. Performed at [facility], [address] [place], [place] [address]   APTT     Status: None   Collection Time: 11/01/20  9:56 AM  Result Value Ref Range   aPTT 31 24 - 36 seconds    Comment: Performed at [facility], [address] [place], [place] [address]  Urinalysis, Complete w Microscopic     Status: Abnormal   Collection Time: 11/01/20 10:26 AM  Result Value Ref Range   Color, Urine YELLOW YELLOW   APPearance CLEAR CLEAR   Specific Gravity, Urine 1.025 1.005 - 1.030   pH 5.5 5.0 - 8.0   Glucose, UA 500 (A) NEGATIVE mg/dL   Hgb urine dipstick TRACE (A) NEGATIVE   Bilirubin Urine NEGATIVE NEGATIVE   Ketones, ur 80 (A) NEGATIVE mg/dL   Protein, ur NEGATIVE NEGATIVE mg/dL   Nitrite NEGATIVE NEGATIVE   Leukocytes,Ua NEGATIVE NEGATIVE   Squamous Epithelial / LPF 0-5 0 - 5   WBC, UA 0-5 0 - 5 WBC/hpf   RBC / HPF 0-5 0 - 5 RBC/hpf   Bacteria, UA NONE SEEN NONE SEEN    Comment: Performed at [facility], [address], [place], [place] [address]  Resp Panel by RT-PCR (Flu A&B, Covid) Nasopharyngeal Swab     Status: None   Collection Time: 11/01/20 11:01 AM   Specimen: Nasopharyngeal Swab; Nasopharyngeal(NP) swabs in vial transport medium  Result Value Ref Range   SARS Coronavirus 2 by RT PCR NEGATIVE NEGATIVE    Comment: (NOTE) SARS-CoV-2 target nucleic acids are NOT DETECTED.  The SARS-CoV-2 RNA is generally detectable in upper respiratory specimens during the acute phase of infection. The lowest concentration of SARS-CoV-2 viral copies this assay can detect is 138 copies/mL. A negative result does not preclude SARS-Cov-2 infection and should not be used as the sole basis for treatment or other patient management decisions. A negative result may occur with  improper specimen collection/handling, submission of specimen other than nasopharyngeal swab, presence of viral mutation(s) within the areas targeted by this assay, and inadequate number of viral copies(<138 copies/mL). A negative result must be combined with clinical observations, patient history, and epidemiological information. The expected result is Negative.  Fact Sheet for Patients:   EntrepreneurPulse.com.au  Fact Sheet for Healthcare Providers:  IncredibleEmployment.be  This test is no t yet approved or cleared by the [place] FDA and  has been authorized for detection and/or diagnosis of SARS-CoV-2 by FDA under an Emergency Use Authorization (EUA). This  EUA will remain  in effect (meaning this test can be used) for the duration of the COVID-19 declaration under Section 564(b)(1) of the Act, 21 U.S.C.section 360bbb-3(b)(1), unless the authorization is terminated  or revoked sooner.       Influenza A by PCR NEGATIVE NEGATIVE   Influenza B by PCR NEGATIVE NEGATIVE    Comment: (NOTE) The Xpert Xpress SARS-CoV-2/FLU/RSV plus assay is intended as an aid in the diagnosis of influenza from Nasopharyngeal swab specimens and should not be used as a sole basis for treatment. Nasal washings and aspirates are unacceptable for Xpert Xpress SARS-CoV-2/FLU/RSV testing.  Fact Sheet for Patients: EntrepreneurPulse.com.au  Fact Sheet for Healthcare Providers: IncredibleEmployment.be  This test is not yet approved or cleared by the [place] FDA and has been authorized for detection and/or diagnosis of SARS-CoV-2 by FDA under an Emergency Use Authorization (EUA). This EUA will remain in effect (meaning this test can be used) for the duration of the COVID-19 declaration under Section 564(b)(1) of the Act, 21 U.S.C. section 360bbb-3(b)(1), unless the authorization is terminated or revoked.  Performed at [facility], [address] [place], [place] [address]     DG Foot Complete Right  Result Date: 11/01/2020 CLINICAL DATA:  Injury with blackening of the second toe. Open wound at the second toe base EXAM: RIGHT FOOT COMPLETE - 3+ VIEW COMPARISON:  None. FINDINGS: Soft tissue gas is seen at the base of the second digit. No visible osteomyelitis or septic arthritis. No opaque  foreign body. Premature arterial calcification IMPRESSION: Soft tissue emphysema at the base of the second digit suggesting necrotizing infection. No evidence of osteomyelitis. No opaque foreign body. Electronically Signed   By: Monte Fantasia M.D.   On: 11/01/2020 10:09    Review of Systems  Constitutional:  Positive for malaise/fatigue. Negative for chills and fever.  Skin:        Wound on foot  All other systems reviewed and are negative. Blood pressure (!) 142/79, pulse 98, temperature 98.9 F (37.2 C), temperature source Oral, resp. rate 17, height '5\' 7"'$  (1.702 m), weight 85.7 kg, SpO2 98 %.  Vitals:   11/01/20 1100 11/01/20 1104  BP: (!) 142/79   Pulse: 98   Resp: 17   Temp:  98.9 F (37.2 C)  SpO2: 98%     General AA&O x3. Normal mood and affect.  Vascular Dorsalis pedis and posterior tibial pulses  present 2+ right  Capillary refill normal to all digits. Pedal hair growth normal.  Neurologic Epicritic sensation grossly absent.  Dermatologic (Wound) Necrotic plantar puncture ulcer plantar to second toe with cyanosis of the plantar pulp of the toe the dorsal surface has good capillary fill time, cellulitis to the midfoot.  No gross malodor or purulence noted right now See photo below  Orthopedic: Motor intact BLE.        Assessment/Plan:  Diabetic foot infection secondary to puncture wound through shoe -Imaging: Studies independently reviewed.  I have ordered a stat MRI to evaluate for collection or osteomyelitis for surgical planning -Antibiotics: Broad-spectrum antibiotics, agree with one-time dose of clindamycin and continuing doses of Zosyn and vancomycin -WB Status: WBAT to RLE for now -Surgical Plan: to OR tonight for I&D possible antibiotic bead placement.  Keep n.p.o. -He is uninsured and disconnected completely from the healthcare system.  Considering his age and comorbidities of his uncontrolled diabetes he will need social work support and primary care and  endocrinology follow-up after discharge  Criselda Peaches 11/01/2020,  12:25 PM   Best available via secure chat for questions or concerns.

## 2020-11-02 ENCOUNTER — Encounter: Payer: Self-pay | Admitting: Podiatry

## 2020-11-02 LAB — BASIC METABOLIC PANEL
Anion gap: 9 (ref 5–15)
BUN: 17 mg/dL (ref 6–20)
CO2: 23 mmol/L (ref 22–32)
Calcium: 8.1 mg/dL — ABNORMAL LOW (ref 8.9–10.3)
Chloride: 102 mmol/L (ref 98–111)
Creatinine, Ser: 0.86 mg/dL (ref 0.61–1.24)
GFR, Estimated: >60 mL/min (ref >60)
Glucose, Bld: 254 mg/dL — ABNORMAL HIGH (ref 70–99)
Potassium: 4.1 mmol/L (ref 3.5–5.1)
Sodium: 134 mmol/L — ABNORMAL LOW (ref 135–145)

## 2020-11-02 LAB — GLUCOSE, CAPILLARY
Glucose-Capillary: 236 mg/dL — ABNORMAL HIGH (ref 70–99)
Glucose-Capillary: 256 mg/dL — ABNORMAL HIGH (ref 70–99)
Glucose-Capillary: 270 mg/dL — ABNORMAL HIGH (ref 70–99)
Glucose-Capillary: 278 mg/dL — ABNORMAL HIGH (ref 70–99)
Glucose-Capillary: 288 mg/dL — ABNORMAL HIGH (ref 70–99)
Glucose-Capillary: 297 mg/dL — ABNORMAL HIGH (ref 70–99)
Glucose-Capillary: 367 mg/dL — ABNORMAL HIGH (ref 70–99)

## 2020-11-02 LAB — CBC
HCT: 35.8 % — ABNORMAL LOW (ref 39.0–52.0)
Hemoglobin: 12.1 g/dL — ABNORMAL LOW (ref 13.0–17.0)
MCH: 32.1 pg (ref 26.0–34.0)
MCHC: 33.8 g/dL (ref 30.0–36.0)
MCV: 95 fL (ref 80.0–100.0)
Platelets: 218 10*3/uL (ref 150–400)
RBC: 3.77 MIL/uL — ABNORMAL LOW (ref 4.22–5.81)
RDW: 11.8 % (ref 11.5–15.5)
WBC: 18.4 10*3/uL — ABNORMAL HIGH (ref 4.0–10.5)
nRBC: 0 % (ref 0.0–0.2)

## 2020-11-02 MED ORDER — LIVING WELL WITH DIABETES BOOK
Freq: Once | Status: AC
  Filled 2020-11-02: qty 1

## 2020-11-02 MED ORDER — GLUCERNA SHAKE PO LIQD
237.0000 mL | Freq: Three times a day (TID) | ORAL | Status: DC
  Administered 2020-11-02 – 2020-11-08 (×16): 237 mL via ORAL

## 2020-11-02 MED ORDER — PROSOURCE PLUS PO LIQD
30.0000 mL | Freq: Three times a day (TID) | ORAL | Status: DC
  Administered 2020-11-02 – 2020-11-08 (×16): 30 mL via ORAL
  Filled 2020-11-02 (×13): qty 30

## 2020-11-02 MED ORDER — INSULIN GLARGINE-YFGN 100 UNIT/ML ~~LOC~~ SOLN
5.0000 [IU] | Freq: Every day | SUBCUTANEOUS | Status: DC
  Administered 2020-11-02 – 2020-11-14 (×12): 5 [IU] via SUBCUTANEOUS
  Filled 2020-11-02 (×13): qty 0.05

## 2020-11-02 MED ORDER — INSULIN STARTER KIT- PEN NEEDLES (ENGLISH)
1.0000 | Freq: Once | Status: AC
  Administered 2020-11-02: 1
  Filled 2020-11-02: qty 1

## 2020-11-02 MED ORDER — JUVEN PO PACK
1.0000 | PACK | Freq: Two times a day (BID) | ORAL | Status: DC
  Administered 2020-11-02 – 2020-11-16 (×24): 1 via ORAL

## 2020-11-02 MED ORDER — ADULT MULTIVITAMIN W/MINERALS CH
1.0000 | ORAL_TABLET | Freq: Every day | ORAL | Status: DC
  Administered 2020-11-02 – 2020-11-16 (×14): 1 via ORAL
  Filled 2020-11-02 (×13): qty 1

## 2020-11-02 MED ORDER — ENOXAPARIN SODIUM 40 MG/0.4ML IJ SOSY
40.0000 mg | PREFILLED_SYRINGE | INTRAMUSCULAR | Status: DC
  Administered 2020-11-02 – 2020-11-16 (×13): 40 mg via SUBCUTANEOUS
  Filled 2020-11-02 (×11): qty 0.4

## 2020-11-02 MED ORDER — OXYCODONE HCL 5 MG PO TABS
5.0000 mg | ORAL_TABLET | Freq: Four times a day (QID) | ORAL | Status: DC | PRN
  Administered 2020-11-02 – 2020-11-12 (×8): 5 mg via ORAL
  Filled 2020-11-02 (×9): qty 1

## 2020-11-02 NOTE — Consult Note (Signed)
NAME: Fernando Vega  DOB: Feb 04, 1971  MRN: [id number]  Date/Time: 11/02/2020 2:44 PM  REQUESTING PROVIDER : Kurtis Bushman Subjective:  REASON FOR CONSULT: infected puncture wound ? Fernando Vega is a 50 y.o. male with a history of DM, peripheral neuropathy, presents to the ED on 11/01/20 with foot injury, which he apparently sustained [place] the [place] and thinks a screw would have gone thru his shoe.  HE did not have any pain , but noticed blood from rt foot- cleaned with eperoxide and epsom salt. As it was getting worse and he noted some foul smelling drainage came to ED  Vitals in ED 98.1, BP 112/69, Pulse 94 and sats 93% WBC 18.2, HB 13. PLT 202, cr 0.83 Blood culture sent. Was started on antibiotics- vanco/cefepime and flagyl MRI of the foot Soft tissue edema of the second toe concerning for cellulitis. Soft tissue edema extends into the dorsal aspect of the foot concerning for cellulitis versus reactive edema. Seen by podiatrist and taken for surgery on 8/22. Underwent incision and drainage of abscess in multiple planes right foot He was non compliant with DM treatment  I am asked to see the patient for antibiotic management  Past Medical History:  Diagnosis Date   Diabetes mellitus without complication ([place])    Neuropathy    Sarcoidosis    Sleep apnea     Past Surgical History:  Procedure Laterality Date   INCISION AND DRAINAGE Right 11/01/2020   Procedure: INCISION AND DRAINAGE-Right Foot;  Surgeon: Criselda Peaches, DPM;  Location: [facility] ORS;  Service: Podiatry;  Laterality: Right;    Social History   Socioeconomic History   Marital status: Married    Spouse name: Not on file   Number of children: Not on file   Years of education: Not on file   Highest education level: Not on file  Occupational History   Not on file  Tobacco Use   Smoking status: Never   Smokeless tobacco: Never  Substance and Sexual Activity   Alcohol use: No   Drug use: No   Sexual activity:  Not on file  Other Topics Concern   Not on file  Social History Narrative   Not on file   Social Determinants of Health   Financial Resource Strain: Not on file  Food Insecurity: Not on file  Transportation Needs: Not on file  Physical Activity: Not on file  Stress: Not on file  Social Connections: Not on file  Intimate Partner Violence: Not on file    Family History  Problem Relation Age of Onset   Hypertension Mother    No Known Allergies I? Current Facility-Administered Medications  Medication Dose Route Frequency Provider Last Rate Last Admin   (feeding supplement) PROSource Plus liquid 30 mL  30 mL Oral TID BM Kurtis Bushman, Sahar, MD       0.9 %  sodium chloride infusion   Intravenous Continuous Agbata, Tochukwu, MD 100 mL/hr at 11/02/20 0740 Infusion Verify at 11/02/20 0740   albuterol (PROVENTIL) (2.5 MG/3ML) 0.083% nebulizer solution 3 mL  3 mL Inhalation Q6H PRN Agbata, Tochukwu, MD       ceFEPIme (MAXIPIME) 2 g in sodium chloride 0.9 % 100 mL IVPB  2 g Intravenous Q8H Agbata, Tochukwu, MD 200 mL/hr at 11/02/20 1411 2 g at 11/02/20 1411   enoxaparin (LOVENOX) injection 40 mg  40 mg Subcutaneous Q24H Nolberto Hanlon, MD   40 mg at 11/02/20 1412   feeding supplement ([place]) (  GLUCERNA SHAKE) liquid 237 mL  237 mL Oral TID BM Nolberto Hanlon, MD       insulin aspart (novoLOG) injection 0-15 Units  0-15 Units Subcutaneous Q4H Agbata, Tochukwu, MD   8 Units at 11/02/20 1225   insulin glargine-yfgn (SEMGLEE) injection 5 Units  5 Units Subcutaneous Daily Nolberto Hanlon, MD       insulin starter kit- pen needles (English) 1 kit  1 kit Other Once Nolberto Hanlon, MD       living well with diabetes book MISC   Does not apply Once Nolberto Hanlon, MD       metroNIDAZOLE (FLAGYL) IVPB 500 mg  500 mg Intravenous Q12H Dorothe Pea, [place] at 11/02/20 2505   multivitamin with minerals tablet 1 tablet  1 tablet Oral Daily Nolberto Hanlon, MD   1 tablet at 11/02/20 1242   nutrition supplement  (JUVEN) (JUVEN) powder packet 1 packet  1 packet Oral BID BM Nolberto Hanlon, MD       ondansetron (ZOFRAN) tablet 4 mg  4 mg Oral Q6H PRN Agbata, Tochukwu, MD       Or   ondansetron (ZOFRAN) injection 4 mg  4 mg Intravenous Q6H PRN Agbata, Tochukwu, MD       oxyCODONE (Oxy IR/ROXICODONE) immediate release tablet 5 mg  5 mg Oral Q6H PRN Sharion Settler, NP   5 mg at 11/02/20 1418   vancomycin (VANCOREADY) IVPB 1250 mg/250 mL  1,250 mg Intravenous Q12H Agbata, Tochukwu, MD 166.7 mL/hr at 11/02/20 0254 1,250 mg at 11/02/20 0254     Abtx:  Anti-infectives (From admission, onward)    Start     Dose/Rate Route Frequency Ordered Stop   11/02/20 0400  metroNIDAZOLE (FLAGYL) IVPB 500 mg        500 mg 100 mL/hr over 60 Minutes Intravenous Every 12 hours 11/01/20 2012 11/09/20 0359   11/02/20 0200  vancomycin (VANCOREADY) IVPB 1250 mg/250 mL        1,250 mg 166.7 mL/hr over 90 Minutes Intravenous Every 12 hours 11/01/20 1226 11/09/20 0159   11/01/20 1727  vancomycin (VANCOCIN) powder  Status:  Discontinued          As needed 11/01/20 1727 11/01/20 1746   11/01/20 1400  ceFEPIme (MAXIPIME) 2 g in sodium chloride 0.9 % 100 mL IVPB        2 g 200 mL/hr over 30 Minutes Intravenous Every 8 hours 11/01/20 1226 11/08/20 1359   11/01/20 1200  metroNIDAZOLE (FLAGYL) IVPB 500 mg  Status:  Discontinued        500 mg 100 mL/hr over 60 Minutes Intravenous Every 8 hours 11/01/20 1144 11/01/20 2012   11/01/20 1100  vancomycin (VANCOREADY) IVPB 2000 mg/400 mL        2,000 mg 200 mL/hr over 120 Minutes Intravenous  Once 11/01/20 1049 11/01/20 1515   11/01/20 1100  piperacillin-tazobactam (ZOSYN) IVPB 3.375 g        3.375 g 100 mL/hr over 30 Minutes Intravenous  Once 11/01/20 1049 11/01/20 1116   11/01/20 1100  clindamycin (CLEOCIN) IVPB 600 mg        600 mg 100 mL/hr over 30 Minutes Intravenous  Once 11/01/20 1051 11/01/20 1305       REVIEW OF SYSTEMS:  Const: negative fever, negative chills, negative  weight loss Eyes: negative diplopia or visual changes, negative eye pain ENT: negative coryza, negative sore throat Resp: negative cough, hemoptysis, dyspnea Cards: negative for chest pain, palpitations, lower extremity edema  GU: negative for frequency, dysuria and hematuria GI: Negative for abdominal pain, diarrhea, bleeding, constipation Skin: negative for rash and pruritus Heme: negative for easy bruising and gum/nose bleeding MS: as above Neurolo:negative for headaches, dizziness, vertigo, memory problems  Psych: negative for feelings of anxiety, depression  Endocrine: , diabetes Allergy/Immunology- negative for any medication or food allergies ?  Objective:  VITALS:  BP (!) 147/85 (BP Location: Left Arm)   Pulse 93   Temp 97.8 F (36.6 C)   Resp 15   Ht _0  (1.702 m)   Wt 85.7 kg   SpO2 100%   BMI 29.59 kg/m  PHYSICAL EXAM:  General: Alert, cooperative, no distress, appears stated age.  Head: Normocephalic, without obvious abnormality, atraumatic. Eyes: Conjunctivae clear, anicteric sclerae. Pupils are equal ENT Nares normal. No drainage or sinus tenderness. Lips, mucosa, and tongue normal. No Thrush Neck: Supple, symmetrical, no adenopathy, thyroid: non tender no carotid bruit and no JVD. Back: No CVA tenderness. Lungs: Clear to auscultation bilaterally. No Wheezing or Rhonchi. No rales. Heart: Regular rate and rhythm, no murmur, rub or gallop. Abdomen: Soft, non-tender,not distended. Bowel sounds normal. No masses Extremities:       Skin: No rashes or lesions. Or bruising Lymph: Cervical, supraclavicular normal. Neurologic: Grossly non-focal Pertinent Labs Lab Results CBC    Component Value Date/Time   WBC 18.4 (H) 11/02/2020 0342   RBC 3.77 (L) 11/02/2020 0342   HGB 12.1 (L) 11/02/2020 0342   HGB 13.6 12/03/2012 0520   HCT 35.8 (L) 11/02/2020 0342   HCT 39.8 (L) 12/03/2012 0520   PLT 218 11/02/2020 0342   PLT 171 12/03/2012 0520   MCV 95.0  11/02/2020 0342   MCV 91 12/03/2012 0520   MCH 32.1 11/02/2020 0342   MCHC 33.8 11/02/2020 0342   RDW 11.8 11/02/2020 0342   RDW 12.4 12/03/2012 0520   LYMPHSABS 2.1 11/01/2020 0956   LYMPHSABS 2.7 12/03/2012 0520   MONOABS 1.9 (H) 11/01/2020 0956   MONOABS 1.5 (H) 12/03/2012 0520   EOSABS 0.1 11/01/2020 0956   EOSABS 0.3 12/03/2012 0520   BASOSABS 0.1 11/01/2020 0956   BASOSABS 0.0 12/03/2012 0520    CMP Latest Ref Rng & Units 11/02/2020 11/01/2020 09/01/2019  Glucose 70 - 99 mg/dL 254(H) 278(H) 504(HH)  BUN 6 - 20 mg/dL _1 Creatinine 0.61 - 1.24 mg/dL 0.86 0.83 0.95  Sodium 135 - 145 mmol/L 134(L) 134(L) 134(L)  Potassium 3.5 - 5.1 mmol/L 4.1 3.9 4.1  Chloride 98 - 111 mmol/L 102 98 96(L)  CO2 22 - 32 mmol/L _2 Calcium 8.9 - 10.3 mg/dL 8.1(L) 8.4(L) 8.6(L)  Total Protein 6.5 - 8.1 g/dL - 6.2(L) -  Total Bilirubin 0.3 - 1.2 mg/dL - 1.6(H) -  Alkaline Phos 38 - 126 U/L - 74 -  AST 15 - 41 U/L - 11(L) -  ALT 0 - 44 U/L - 12 -      Microbiology: Recent Results (from the past 240 hour(s))  Blood culture (single)     Status: None (Preliminary result)   Collection Time: 11/01/20  9:56 AM   Specimen: BLOOD  Result Value Ref Range Status   Specimen Description BLOOD LEFT FA  Final   Special Requests   Final    BOTTLES DRAWN AEROBIC AND ANAEROBIC Blood Culture adequate volume   Culture   Final    NO GROWTH < 24 HOURS Performed at [facility], [address], [place],  [address]  Report Status PENDING  Incomplete  Aerobic Culture w Gram Stain (superficial specimen)     Status: None (Preliminary result)   Collection Time: 11/01/20 10:26 AM   Specimen: Foot  Result Value Ref Range Status   Specimen Description   Final    FOOT RIGHT Performed at [facility], [address], [place], [place] [address]    Special Requests   Final    NONE Performed at [facility], [address] [place], [place] [address]    Gram Stain    Final    NO ORGANISMS SEEN SQUAMOUS EPITHELIAL CELLS PRESENT RARE WBC PRESENT, PREDOMINANTLY MONONUCLEAR NO ORGANISMS SEEN BACTERIA    Culture   Final    RARE STAPHYLOCOCCUS AUREUS CULTURE REINCUBATED FOR BETTER GROWTH Performed at [facility], [place] [address], [place], [place] [address]    Report Status PENDING  Incomplete  Resp Panel by RT-PCR (Flu A&B, Covid) Nasopharyngeal Swab     Status: None   Collection Time: 11/01/20 11:01 AM   Specimen: Nasopharyngeal Swab; Nasopharyngeal(NP) swabs in vial transport medium  Result Value Ref Range Status   SARS Coronavirus 2 by RT PCR NEGATIVE NEGATIVE Final    Comment: (NOTE) SARS-CoV-2 target nucleic acids are NOT DETECTED.  The SARS-CoV-2 RNA is generally detectable in upper respiratory specimens during the acute phase of infection. The lowest concentration of SARS-CoV-2 viral copies this assay can detect is 138 copies/mL. A negative result does not preclude SARS-Cov-2 infection and should not be used as the sole basis for treatment or other patient management decisions. A negative result may occur with  improper specimen collection/handling, submission of specimen other than nasopharyngeal swab, presence of viral mutation(s) within the areas targeted by this assay, and inadequate number of viral copies(<138 copies/mL). A negative result must be combined with clinical observations, patient history, and epidemiological information. The expected result is Negative.  Fact Sheet for Patients:  EntrepreneurPulse.com.au  Fact Sheet for Healthcare Providers:  IncredibleEmployment.be  This test is no t yet approved or cleared by the [place] FDA and  has been authorized for detection and/or diagnosis of SARS-CoV-2 by FDA under an Emergency Use Authorization (EUA). This EUA will remain  in effect (meaning this test can be used) for the duration of the COVID-19 declaration under Section  564(b)(1) of the Act, 21 U.S.C.section 360bbb-3(b)(1), unless the authorization is terminated  or revoked sooner.       Influenza A by PCR NEGATIVE NEGATIVE Final   Influenza B by PCR NEGATIVE NEGATIVE Final    Comment: (NOTE) The Xpert Xpress SARS-CoV-2/FLU/RSV plus assay is intended as an aid in the diagnosis of influenza from Nasopharyngeal swab specimens and should not be used as a sole basis for treatment. Nasal washings and aspirates are unacceptable for Xpert Xpress SARS-CoV-2/FLU/RSV testing.  Fact Sheet for Patients: EntrepreneurPulse.com.au  Fact Sheet for Healthcare Providers: IncredibleEmployment.be  This test is not yet approved or cleared by the [place] FDA and has been authorized for detection and/or diagnosis of SARS-CoV-2 by FDA under an Emergency Use Authorization (EUA). This EUA will remain in effect (meaning this test can be used) for the duration of the COVID-19 declaration under Section 564(b)(1) of the Act, 21 U.S.C. section 360bbb-3(b)(1), unless the authorization is terminated or revoked.  Performed at [facility], [address] [place], [place] [address]   Aerobic/Anaerobic Culture w Gram Stain (surgical/deep wound)     Status: None (Preliminary result)   Collection Time: 11/01/20  5:28  PM   Specimen: PATH Other; Tissue  Result Value Ref Range Status   Specimen Description WOUND RIGHT FOOT  Final   Special Requests 2ND TOE  Final   Gram Stain   Final    RARE WBC PRESENT, PREDOMINANTLY MONONUCLEAR ABUNDANT GRAM POSITIVE COCCI    Culture   Final    MODERATE STAPHYLOCOCCUS AUREUS CULTURE REINCUBATED FOR BETTER GROWTH ABUNDANT STREPTOCOCCUS AGALACTIAE TESTING AGAINST S. AGALACTIAE NOT ROUTINELY PERFORMED DUE TO PREDICTABILITY OF AMP/PEN/VAN SUSCEPTIBILITY. Performed at [facility], [place] [address], [place], [place] [address]    Report Status PENDING  Incomplete    IMAGING RESULTS: I  have personally reviewed the films ? Impression/Recommendation ?Puncture wound rt foot with diabetes S/p I/D of abscess at multiple plane levels Ischemic necrosis Staph aureus and GBS in wound culture so far On vanco/cefepime+ flagyl Will switch  vanco to linezolid if it MRSA Pt likely will need amputation of the 2nd and 3rd toe due to evolving gangrene necrosis  Leucocytosis ? ?DM - was not taking meds at home- on insulin now  Discussed the management with the patient    ___________________________________________________ Discussed with patient, requesting provider Note:  This document was prepared using Dragon voice recognition software and may include unintentional dictation errors.

## 2020-11-02 NOTE — Progress Notes (Addendum)
Inpatient Diabetes Program Recommendations  AACE/ADA: New Consensus Statement on Inpatient Glycemic Control (2015)  Target Ranges:  Prepandial:   less than 140 mg/dL      Peak postprandial:   less than 180 mg/dL (1-2 hours)      Critically ill patients:  140 - 180 mg/dL   Lab Results  Component Value Date   GLUCAP 256 (H) 11/02/2020   HGBA1C 11.8 (H) 11/01/2020    Review of Glycemic Control Results for LEIAM, HOPWOOD (MRN [id number]) as of 11/02/2020 10:41  Ref. Range 11/01/2020 17:56 11/01/2020 20:01 11/02/2020 00:01 11/02/2020 03:16 11/02/2020 07:35  Glucose-Capillary Latest Ref Range: 70 - 99 mg/dL 153 (H) 174 (H) 367 (H) 278 (H) 256 (H)   Diabetes history: DM2 Outpatient Diabetes medications: None Current orders for Inpatient glycemic control: Novolog 0-15 units q 4 hrs. correction  Inpatient Diabetes Program Recommendations:   Spoke with patient to clarify medications and discussed A1C results 11.8 (average blood glucose 292 over the past 2-3 months) and explained what an A1C is, basic pathophysiology of DM Type 2, basic home care, basic diabetes diet nutrition principles, importance of checking CBGs and maintaining good CBG control to prevent long-term and short-term complications. Reviewed signs and symptoms of hyperglycemia and hypoglycemia and how to treat hypoglycemia at home. Also reviewed blood sugar goals at home.  RNs to provide ongoing basic DM education at bedside with this patient. Have ordered educational booklet and insulin pen starter kit.  Nurses, please teach insulin administration and allow pt. To give his injections while in the hospital to help prepare if discharged home on insulin. Secure chat sent to LPN Maggie Schwalbe with request.  Patient has been on Metformin in the past but unable to tolerate due to severe diarrhea. Add Novolog 70/30 insulin 12 units bid ac meals (approximately 17 units basal + 7 units meal coverage) -Change Novolog correction to 0-15 tid + hs 0-5  units Will plan to teach patient insulin administration to prepare for discharge. Patient does not have insurance and TOC working with patient for referral to [facility].  Educated patient , daughter. and spouse on insulin pen use at home. Reviewed contents of insulin flexpen starter kit. Reviewed all steps if insulin pen including attachment of needle, 2-unit air shot, dialing up dose, giving injection, removing needle, disposal of sharps, storage of unused insulin, disposal of insulin etc. Patient able to provide successful return demonstration. Also reviewed troubleshooting with insulin pen. MD to give patient Rxs for insulin pens and insulin pen needles.  Thank you, Nani Gasser. Mardene Lessig, RN, MSN, CDE  Diabetes Coordinator Inpatient Glycemic Control Team Team Pager [phone] (8am-5pm) 11/02/2020 11:46 AM

## 2020-11-02 NOTE — H&P (View-Only) (Signed)
   PODIATRY CONSULTATION  NAME Fernando Vega MRN [id number] DOB 1970/08/23 DOA 11/01/2020   Subjective: Patient status post incision and drainage right foot.  DOS: 11/02/2020.  Patient feeling well.  He is kept the dressings clean dry and intact as instructed.  He presents today with his daughter present in the room.  Past Medical History:  Diagnosis Date   Diabetes mellitus without complication ([place])    Neuropathy    Sarcoidosis    Sleep apnea     CBC Latest Ref Rng & Units 11/02/2020 11/01/2020 09/01/2019  WBC 4.0 - 10.5 K/uL 18.4(H) 18.2(H) 7.1  Hemoglobin 13.0 - 17.0 g/dL 12.1(L) 13.0 14.2  Hematocrit 39.0 - 52.0 % 35.8(L) 37.2(L) 41.9  Platelets 150 - 400 K/uL 218 202 233    BMP Latest Ref Rng & Units 11/02/2020 11/01/2020 09/01/2019  Glucose 70 - 99 mg/dL 254(H) 278(H) 504(HH)  BUN 6 - 20 mg/dL '17 14 16  '$ Creatinine 0.61 - 1.24 mg/dL 0.86 0.83 0.95  Sodium 135 - 145 mmol/L 134(L) 134(L) 134(L)  Potassium 3.5 - 5.1 mmol/L 4.1 3.9 4.1  Chloride 98 - 111 mmol/L 102 98 96(L)  CO2 22 - 32 mmol/L '23 25 25  '$ Calcium 8.9 - 10.3 mg/dL 8.1(L) 8.4(L) 8.6(L)         Physical Exam: Sutures intact.  Packing removed.  No purulence or heavy drainage noted to the plantar incisional area.  Ischemia with discoloration noted to the second and questionable third digit of the foot. His pulses are palpable.  There is no erythema or warmth to the area.  No purulence.  No malodor noted.    ASSESSMENT/PLAN OF CARE S/P incision and drainage RT foot 11/02/2020 -After discussing with the patient and his daughter, recommend simple observation over the next 24 hours to see if the toe improves or digresses.  Also observe the third toe as well. -If the second and third digits deteriorate over the next 24-48 hours and there is necrosis of the toes with onset of ischemic gangrene recommend return to the OR for toe amputations. -In the meantime continue daily dressing changes -Continue IV  antibiotics -Podiatry will continue to follow.  If the toes do not improve we will likely proceed with return to the OR Thursday or Friday     Thank you for the consult.  Please contact me directly with any questions or concerns.  Cell [phone]   Edrick Kins, DPM [facility]  Dr. Edrick Kins, DPM    [address], [place] [address]                Office [phone]  Fax [phone]

## 2020-11-02 NOTE — Progress Notes (Signed)
   PODIATRY CONSULTATION  NAME Fernando Vega MRN [id number] DOB [birth year] DOA 11/01/2020   Subjective: Patient status post incision and drainage right foot.  DOS: 11/02/2020.  Patient feeling well.  He is kept the dressings clean dry and intact as instructed.  He presents today with his daughter present in the room.  Past Medical History:  Diagnosis Date   Diabetes mellitus without complication ([place])    Neuropathy    Sarcoidosis    Sleep apnea     CBC Latest Ref Rng & Units 11/02/2020 11/01/2020 09/01/2019  WBC 4.0 - 10.5 K/uL 18.4(H) 18.2(H) 7.1  Hemoglobin 13.0 - 17.0 g/dL 12.1(L) 13.0 14.2  Hematocrit 39.0 - 52.0 % 35.8(L) 37.2(L) 41.9  Platelets 150 - 400 K/uL 218 202 233    BMP Latest Ref Rng & Units 11/02/2020 11/01/2020 09/01/2019  Glucose 70 - 99 mg/dL 254(H) 278(H) 504(HH)  BUN 6 - 20 mg/dL '17 14 16  '$ Creatinine 0.61 - 1.24 mg/dL 0.86 0.83 0.95  Sodium 135 - 145 mmol/L 134(L) 134(L) 134(L)  Potassium 3.5 - 5.1 mmol/L 4.1 3.9 4.1  Chloride 98 - 111 mmol/L 102 98 96(L)  CO2 22 - 32 mmol/L '23 25 25  '$ Calcium 8.9 - 10.3 mg/dL 8.1(L) 8.4(L) 8.6(L)         Physical Exam: Sutures intact.  Packing removed.  No purulence or heavy drainage noted to the plantar incisional area.  Ischemia with discoloration noted to the second and questionable third digit of the foot. His pulses are palpable.  There is no erythema or warmth to the area.  No purulence.  No malodor noted.    ASSESSMENT/PLAN OF CARE S/P incision and drainage RT foot 11/02/2020 -After discussing with the patient and his daughter, recommend simple observation over the next 24 hours to see if the toe improves or digresses.  Also observe the third toe as well. -If the second and third digits deteriorate over the next 24-48 hours and there is necrosis of the toes with onset of ischemic gangrene recommend return to the OR for toe amputations. -In the meantime continue daily dressing changes -Continue IV  antibiotics -Podiatry will continue to follow.  If the toes do not improve we will likely proceed with return to the OR Thursday or Friday     Thank you for the consult.  Please contact me directly with any questions or concerns.  Cell [phone]   Edrick Kins, DPM [facility]  Dr. Edrick Kins, DPM    [address], [place] [address]                Office [phone]  Fax [phone]

## 2020-11-02 NOTE — Progress Notes (Signed)
PROGRESS NOTE    Fernando Vega  [id number] DOB: [birth year] DOA: 11/01/2020 PCP: Patient, No Pcp Per (Inactive)    Brief Narrative:  Fernando Vega is a 50 y.o. male with medical history significant for diabetes mellitus with complications of peripheral neuropathy who presents to the emergency room for evaluation of a foot injury which happened about 3 days prior to his presentation. Patient states that he was at a junk yard and probably stepped on a screw that went through his shoes.  He did not feel any pain at the time and only noticed he was injured after he got home and saw blood draining from his right foot after taking a shower.  He states that his son recommended cleaning the foot with peroxide and Epsom salt which he did and he wrapped it up but he noticed that over the last couple of days its had an increased foul-smelling drainage. He has a history of diabetes mellitus and is supposed to be on metformin but he does not take it because it causes diarrhea.Patient received IV clindamycin, Zosyn and vancomycin in the ER as well as a dose of Tdap and will be admitted to the hospital for further evaluation. Podiatry consult has been requested  He has marked leukocytosis and right foot x-ray shows evidence of necrotizing infection involving the right second toe.   8/23-status post I&D of the right foot by podiatry on 8/22  Consultants:  Podiatry  Procedures:   Antimicrobials:  Metronidazole cefepime and vancomycin 8/22   Subjective: Mild pain. No n/v/sob  Objective: Vitals:   11/01/20 1858 11/01/20 2042 11/02/20 0325 11/02/20 0733  BP: 98/67 112/69 138/81 120/69  Pulse: 88 94 91 94  Resp: '16 16 16 15  '$ Temp: 98.3 F (36.8 C) 98.1 F (36.7 C) 98.8 F (37.1 C) 98.9 F (37.2 C)  TempSrc: Oral     SpO2: 94% 93% 94% 94%  Weight:      Height:        Intake/Output Summary (Last 24 hours) at 11/02/2020 0834 Last data filed at 11/02/2020 0740 Gross per 24 hour  Intake  1938.29 ml  Output 1500 ml  Net 438.29 ml   Filed Weights   11/01/20 0917 11/01/20 1608  Weight: 85.7 kg 85.7 kg    Examination:  General exam: Appears calm and comfortable  Respiratory system: Clear to auscultation. Respiratory effort normal. Cardiovascular system: S1 & S2 heard, RRR.  No gallops  Gastrointestinal system: Abdomen is nondistended, soft and nontender. Normal bowel sounds heard. Central nervous system: Alert and oriented.  Grossly intact Extremities: Right foot dressing in place Psychiatry: Judgement and insight appear normal. Mood & affect appropriate.     Data Reviewed: I have personally reviewed following labs and imaging studies  CBC: Recent Labs  Lab 11/01/20 0956 11/02/20 0342  WBC 18.2* 18.4*  NEUTROABS 13.9*  --   HGB 13.0 12.1*  HCT 37.2* 35.8*  MCV 92.5 95.0  PLT 202 [id number]   Basic Metabolic Panel: Recent Labs  Lab 11/01/20 0956 11/02/20 0342  NA 134* 134*  K 3.9 4.1  CL 98 102  CO2 25 23  GLUCOSE 278* 254*  BUN 14 17  CREATININE 0.83 0.86  CALCIUM 8.4* 8.1*   GFR: Estimated Creatinine Clearance: 105 mL/min (by C-G formula based on SCr of 0.86 mg/dL). Liver Function Tests: Recent Labs  Lab 11/01/20 0956  AST 11*  ALT 12  ALKPHOS 74  BILITOT 1.6*  PROT 6.2*  ALBUMIN 3.1*  No results for input(s): LIPASE, AMYLASE in the last 168 hours. No results for input(s): AMMONIA in the last 168 hours. Coagulation Profile: Recent Labs  Lab 11/01/20 0956  INR 1.1   Cardiac Enzymes: No results for input(s): CKTOTAL, CKMB, CKMBINDEX, TROPONINI in the last 168 hours. BNP (last 3 results) No results for input(s): PROBNP in the last 8760 hours. HbA1C: Recent Labs    11/01/20 1536  HGBA1C 11.8*   CBG: Recent Labs  Lab 11/01/20 1756 11/01/20 2001 11/02/20 0001 11/02/20 0316 11/02/20 0735  GLUCAP 153* 174* 367* 278* 256*   Lipid Profile: No results for input(s): CHOL, HDL, LDLCALC, TRIG, CHOLHDL, LDLDIRECT in the last 72  hours. Thyroid Function Tests: No results for input(s): TSH, T4TOTAL, FREET4, T3FREE, THYROIDAB in the last 72 hours. Anemia Panel: No results for input(s): VITAMINB12, FOLATE, FERRITIN, TIBC, IRON, RETICCTPCT in the last 72 hours. Sepsis Labs: Recent Labs  Lab 11/01/20 0956  LATICACIDVEN 1.3    Recent Results (from the past 240 hour(s))  Blood culture (single)     Status: None (Preliminary result)   Collection Time: 11/01/20  9:56 AM   Specimen: BLOOD  Result Value Ref Range Status   Specimen Description BLOOD LEFT FA  Final   Special Requests   Final    BOTTLES DRAWN AEROBIC AND ANAEROBIC Blood Culture adequate volume   Culture   Final    NO GROWTH < 24 HOURS Performed at [facility], [address] [place], [place] [address]    Report Status PENDING  Incomplete  Aerobic Culture w Gram Stain (superficial specimen)     Status: None (Preliminary result)   Collection Time: 11/01/20 10:26 AM   Specimen: Foot  Result Value Ref Range Status   Specimen Description   Final    FOOT RIGHT Performed at [facility], [address], [place], [place] [address]    Special Requests   Final    NONE Performed at [facility], [address] [place], [place] [address]    Gram Stain   Final    NO ORGANISMS SEEN SQUAMOUS EPITHELIAL CELLS PRESENT RARE WBC PRESENT, PREDOMINANTLY MONONUCLEAR NO ORGANISMS SEEN BACTERIA Performed at [facility], [place] [address] [place], [place] [address]    Culture PENDING  Incomplete   Report Status PENDING  Incomplete  Resp Panel by RT-PCR (Flu A&B, Covid) Nasopharyngeal Swab     Status: None   Collection Time: 11/01/20 11:01 AM   Specimen: Nasopharyngeal Swab; Nasopharyngeal(NP) swabs in vial transport medium  Result Value Ref Range Status   SARS Coronavirus 2 by RT PCR NEGATIVE NEGATIVE Final    Comment: (NOTE) SARS-CoV-2 target nucleic acids are NOT DETECTED.  The SARS-CoV-2 RNA is generally detectable in  upper respiratory specimens during the acute phase of infection. The lowest concentration of SARS-CoV-2 viral copies this assay can detect is 138 copies/mL. A negative result does not preclude SARS-Cov-2 infection and should not be used as the sole basis for treatment or other patient management decisions. A negative result may occur with  improper specimen collection/handling, submission of specimen other than nasopharyngeal swab, presence of viral mutation(s) within the areas targeted by this assay, and inadequate number of viral copies(<138 copies/mL). A negative result must be combined with clinical observations, patient history, and epidemiological information. The expected result is Negative.  Fact Sheet for Patients:  EntrepreneurPulse.com.au  Fact Sheet for Healthcare Providers:  IncredibleEmployment.be  This test is no t yet approved or cleared by the  [place] Architectural technologist and  has been authorized for detection and/or diagnosis of SARS-CoV-2 by FDA under an Print production planner (EUA). This EUA will remain  in effect (meaning this test can be used) for the duration of the COVID-19 declaration under Section 564(b)(1) of the Act, 21 U.S.C.section 360bbb-3(b)(1), unless the authorization is terminated  or revoked sooner.       Influenza A by PCR NEGATIVE NEGATIVE Final   Influenza B by PCR NEGATIVE NEGATIVE Final    Comment: (NOTE) The Xpert Xpress SARS-CoV-2/FLU/RSV plus assay is intended as an aid in the diagnosis of influenza from Nasopharyngeal swab specimens and should not be used as a sole basis for treatment. Nasal washings and aspirates are unacceptable for Xpert Xpress SARS-CoV-2/FLU/RSV testing.  Fact Sheet for Patients: EntrepreneurPulse.com.au  Fact Sheet for Healthcare Providers: IncredibleEmployment.be  This test is not yet approved or cleared by the [place] FDA and has been  authorized for detection and/or diagnosis of SARS-CoV-2 by FDA under an Emergency Use Authorization (EUA). This EUA will remain in effect (meaning this test can be used) for the duration of the COVID-19 declaration under Section 564(b)(1) of the Act, 21 U.S.C. section 360bbb-3(b)(1), unless the authorization is terminated or revoked.  Performed at [facility], [place], [place] [address]   Aerobic/Anaerobic Culture w Gram Stain (surgical/deep wound)     Status: None (Preliminary result)   Collection Time: 11/01/20  5:28 PM   Specimen: PATH Other; Tissue  Result Value Ref Range Status   Specimen Description WOUND RIGHT FOOT  Final   Special Requests 2ND TOE  Final   Gram Stain   Final    RARE WBC PRESENT, PREDOMINANTLY MONONUCLEAR ABUNDANT GRAM POSITIVE COCCI Performed at [facility], [place] [address] [place], [place] [address]    Culture PENDING  Incomplete   Report Status PENDING  Incomplete         Radiology Studies: MR FOOT RIGHT W WO CONTRAST  Result Date: 11/01/2020 CLINICAL DATA:  Foot pain and swelling. History of diabetes and neuropathy. EXAM: MRI OF THE RIGHT FOREFOOT WITHOUT AND WITH CONTRAST TECHNIQUE: Multiplanar, multisequence MR imaging of the right forefoot was performed before and after the administration of intravenous contrast. CONTRAST:  63m GADAVIST GADOBUTROL 1 MMOL/ML IV SOLN COMPARISON:  None. FINDINGS: Patient motion degrades image quality limiting evaluation. Bones/Joint/Cartilage No fracture or dislocation. Normal alignment. No joint effusion. No marrow signal abnormality. Ligaments Collateral ligaments are intact.  Lisfranc ligament is intact. Muscles and Tendons Flexor, peroneal and extensor compartment tendons are intact. Muscles are normal. Soft tissue No fluid collection or hematoma. No soft tissue mass. Soft tissue edema of the second toe concerning for cellulitis. Soft tissue edema extends into the dorsal aspect of the  foot concerning for cellulitis versus reactive edema. IMPRESSION: 1. No osteomyelitis of the right forefoot. 2. Soft tissue edema of the second toe concerning for cellulitis. Soft tissue edema extends into the dorsal aspect of the foot concerning for cellulitis versus reactive edema. Electronically Signed   By: HKathreen DevoidM.D.   On: 11/01/2020 13:11   DG Foot Complete Right  Result Date: 11/01/2020 CLINICAL DATA:  Injury with blackening of the second toe. Open wound at the second toe base EXAM: RIGHT FOOT COMPLETE - 3+ VIEW COMPARISON:  None. FINDINGS: Soft tissue gas is seen at the base of the second digit. No visible osteomyelitis or septic arthritis. No opaque foreign body. Premature arterial calcification IMPRESSION: Soft tissue emphysema at  the base of the second digit suggesting necrotizing infection. No evidence of osteomyelitis. No opaque foreign body. Electronically Signed   By: Monte Fantasia M.D.   On: 11/01/2020 10:09        Scheduled Meds:  insulin aspart  0-15 Units Subcutaneous Q4H   Continuous Infusions:  sodium chloride 100 mL/hr at 11/02/20 0740   ceFEPime (MAXIPIME) IV 2 g (11/02/20 [id number])   metronidazole 500 mg (11/02/20 0426)   vancomycin 1,250 mg (11/02/20 0254)    Assessment & Plan:   Principal Problem:   Necrotizing soft tissue infection Active Problems:   Puncture wound   Type 2 diabetes mellitus with peripheral neuropathy ([place])   Diabetic foot infection ([place])   Necrotizing soft tissue infection involving the right second toe Following a puncture wound X-ray shows soft tissue emphysema at the base of the second digit suggesting necrotizing infection.  No evidence of osteomyelitis.  No opaque foreign body. 8/23 MRI without evidence of osteomyelitis of the right forefoot Continue IV cefepime and vancomycin and metronidazole Status post I&D on 8/22 We will follow-up with podiatry for any further input We will consult ID       Type 2 diabetes mellitus  with peripheral neuropathy Patient has a history of diabetes mellitus but is noncompliant with prescribed therapy due to side effects (stopped taking his metformin due to diarrhea) 8/23 A1c 11.8 BG elevated Diabetic education Start long-acting insulin 5 units daily Continue ISS      DVT prophylaxis: Lovenox Code Status: Full Family Communication: Family at bedside Disposition Plan:  Status is: Inpatient  Remains inpatient appropriate because:Inpatient level of care appropriate due to severity of illness  Dispo: The patient is from: Home              Anticipated d/c is to: Home              Patient currently is not medically stable to d/c.   Difficult to place patient No   Receiving IV antibiotics.  May need to have further surgery further input from podiatry is pending         LOS: 1 day   Time spent: 35 minutes with more than 50% on [facility], MD [facility] Pager [phone]  If 7PM-7AM, please contact night-coverage 11/02/2020, 8:34 AM

## 2020-11-02 NOTE — Anesthesia Postprocedure Evaluation (Signed)
Anesthesia Post Note  Patient: Fernando Vega  Procedure(s) Performed: INCISION AND DRAINAGE-Right Foot (Right: Foot)  Patient location during evaluation: PACU Anesthesia Type: General Level of consciousness: awake and alert Pain management: pain level controlled Vital Signs Assessment: post-procedure vital signs reviewed and stable Respiratory status: spontaneous breathing, nonlabored ventilation, respiratory function stable and patient connected to nasal cannula oxygen Cardiovascular status: blood pressure returned to baseline and stable Postop Assessment: no apparent nausea or vomiting Anesthetic complications: no   No notable events documented.   Last Vitals:  Vitals:   11/02/20 0325 11/02/20 0733  BP: 138/81 120/69  Pulse: 91 94  Resp: 16 15  Temp: 37.1 C 37.2 C  SpO2: 94% 94%    Last Pain:  Vitals:   11/02/20 0800  TempSrc:   PainSc: 0-No pain                 [name]

## 2020-11-02 NOTE — TOC Initial Note (Signed)
Transition of Care [facility] [place] [facility]) - Initial/Assessment Note    Patient Details  Name: Fernando Vega MRN: [id number] Date of Birth: 08-02-1970  [facility]) CM/SW Contact:    Su Hilt, RN Phone Number: 11/02/2020, 9:25 AM  Clinical Narrative:                 Met with the patient and his wife in the room to discuss needs and DC plan He stated that he has not been checking his blood sugars and does not own a glucometer, CM provided him with a glucometer, he does not have a doctor and does not have insurance, [facility]ided him   [facility] and encouraged him to call to get an appointment, [facility] He stated he is not sure what DME he may need and will know more after seeing the Doctor today,  Educated on the importance of checking blood sugars and following up with doctors, he will need assistance with medication, Added Med [facility] to his pharmacy to be able to get his medication, explained the application process and where they are located, will continue to monitor for needs        Patient Goals and CMS Choice        Expected Discharge Plan and Services                                                Prior Living Arrangements/Services                       Activities of Daily Living Home Assistive Devices/Equipment: None ADL Screening (condition at time of admission) Patient's cognitive ability adequate to safely complete daily activities?: Yes Is the patient deaf or have difficulty hearing?: No Does the patient have difficulty seeing, even when wearing glasses/contacts?: No Does the patient have difficulty concentrating, remembering, or making decisions?: No Patient able to express need for assistance with ADLs?: Yes Does the patient have difficulty dressing or bathing?: No Independently performs ADLs?: Yes (appropriate for developmental age) Does the patient have difficulty walking or climbing stairs?:  No Weakness of Legs: Both Weakness of Arms/Hands: None  Permission Sought/Granted                  Emotional Assessment              Admission diagnosis:  Puncture wound [T14.8XXA] Necrotizing cellulitis [L03.90] Diabetic foot infection ([place]) [T02.409, L08.9] Necrotizing soft tissue infection [M79.89] Patient Active Problem List   Diagnosis Date Noted   Puncture wound 11/01/2020   Type 2 diabetes mellitus with peripheral neuropathy ([place]) 11/01/2020   Necrotizing soft tissue infection 11/01/2020   Diabetic foot infection [facility])    PCP:  Patient, No Pcp Per (Inactive) Pharmacy:   [facility] [address] [place] [address] Phone: [phone] Fax: [phone]     Social Determinants of Health (SDOH) Interventions    Readmission Risk Interventions No flowsheet data found.

## 2020-11-02 NOTE — Progress Notes (Signed)
Initial Nutrition Assessment  DOCUMENTATION CODES:  Not applicable  INTERVENTION:  Obtain updated weight.  Add Glucerna Shake po TID, each supplement provides 220 kcal and 10 grams of protein.  Add 30 ml ProSource Plus po TID, each supplement provides 100 kcal and 15 grams of protein.   Add 1 packet Juven BID, each packet provides 95 calories, 2.5 grams of protein (collagen), and 9.8 grams of carbohydrate (3 grams sugar); also contains 7 grams of L-arginine and L-glutamine, 300 mg vitamin C, 15 mg vitamin E, 1.2 mcg vitamin B-12, 9.5 mg zinc, 200 mg calcium, and 1.5 g  Calcium Beta-hydroxy-Beta-methylbutyrate to support wound healing.  Add MVI with minerals daily.  NUTRITION DIAGNOSIS:  Increased nutrient needs related to wound healing as evidenced by estimated needs.  GOAL:  Patient will meet greater than or equal to 90% of their needs  MONITOR:  PO intake, Supplement acceptance, Labs, Weight trends, Skin, I & O's  REASON FOR ASSESSMENT:  Consult Wound healing  ASSESSMENT:  50 yo male with a PMH of 123456 with complications of peripheral neuropathy who presents to the emergency room for evaluation of a foot injury which happened about 3 days prior to his presentation.  Patient states that he was at a junk yard and probably stepped on a screw that went through his shoes. He did not feel any pain at the time and only noticed he was injured after he got home and saw blood draining from his right foot after taking a shower.  He noticed that over the last couple of days its had an increased foul-smelling drainage. 8/22 - incision and drainage of R foot   Spoke with pt about the importance of protein and good nutrition for wound healing. Pt and family are agreeable. Pt reports that he understands that carb modified diet and he is willing to try any supplements to help with healing. Family agreeable and motivated to help with encouragement.  Per Epic, pt's weight appears to be copied from  weight 1 year ago. This would indicate no change over the past year. RD to order measured weight to confirm any changes.  No depletions on exam.  Recommend adding Glucerna shakes TID, ProSource Plus TID, Juven BID, and MVI with minerals daily to help with wound healing.  Medications: reviewed; SSI, NaCl @ 100 ml/hr via IV, oxycodone PRN (given once today)  Labs: reviewed; Na 134 (L), CBG 152-367 (H) HbA1c: 11.8% (11/01/2020)  NUTRITION - FOCUSED PHYSICAL EXAM: Flowsheet Row Most Recent Value  Orbital Region No depletion  Upper Arm Region No depletion  Thoracic and Lumbar Region No depletion  Buccal Region No depletion  Temple Region No depletion  Clavicle Bone Region No depletion  Clavicle and Acromion Bone Region No depletion  Scapular Bone Region No depletion  Dorsal Hand No depletion  Patellar Region No depletion  Anterior Thigh Region No depletion  Posterior Calf Region No depletion  Edema (RD Assessment) Mild  [RLE]  Hair Reviewed  Eyes Reviewed  Mouth Reviewed  Skin Reviewed  Nails Reviewed   Diet Order:   Diet Order             Diet Carb Modified Fluid consistency: Thin; Room service appropriate? Yes  Diet effective now                  EDUCATION NEEDS:  Education needs have been addressed  Skin:  Skin Assessment: Skin Integrity Issues: Skin Integrity Issues:: Incisions Incisions: R foot, closed  Last BM:  11/02/20 - Type 4  Height:  Ht Readings from Last 1 Encounters:  11/01/20 '5\' 7"'$  (1.702 m)   Weight:  Wt Readings from Last 1 Encounters:  11/01/20 85.7 kg   BMI:  Body mass index is 29.59 kg/m.  Estimated Nutritional Needs:  Kcal:  2050-2250 Protein:  105-120 grams Fluid:  >2 L  Derrel Nip, RD, LDN (she/her/hers) Registered Dietitian I After-Hours/Weekend Pager # in [phone]

## 2020-11-03 DIAGNOSIS — D72828 Other elevated white blood cell count: Secondary | ICD-10-CM

## 2020-11-03 LAB — GLUCOSE, CAPILLARY
Glucose-Capillary: 221 mg/dL — ABNORMAL HIGH (ref 70–99)
Glucose-Capillary: 185 mg/dL — ABNORMAL HIGH (ref 70–99)
Glucose-Capillary: 304 mg/dL — ABNORMAL HIGH (ref 70–99)
Glucose-Capillary: 324 mg/dL — ABNORMAL HIGH (ref 70–99)
Glucose-Capillary: 282 mg/dL — ABNORMAL HIGH (ref 70–99)

## 2020-11-03 LAB — CREATININE, SERUM
Creatinine, Ser: 0.8 mg/dL (ref 0.61–1.24)
GFR, Estimated: >60 mL/min (ref >60)

## 2020-11-03 MED ORDER — CALCIUM CARBONATE ANTACID 500 MG PO CHEW
400.0000 mg | CHEWABLE_TABLET | Freq: Two times a day (BID) | ORAL | Status: AC
  Administered 2020-11-03 – 2020-11-05 (×5): 400 mg via ORAL
  Filled 2020-11-03 (×4): qty 2

## 2020-11-03 NOTE — Progress Notes (Signed)
PROGRESS NOTE    Fernando Vega  [id number] DOB: Sep 18, 1970 DOA: 11/01/2020 PCP: Patient, No Pcp Per (Inactive)  Assessment & Plan:   Principal Problem:   Necrotizing soft tissue infection Active Problems:   Puncture wound   Type 2 diabetes mellitus with peripheral neuropathy (HCC)   Diabetic foot infection ([place])  Necrotizing soft tissue infection of right foot: involving the right second toe, following a puncture wound.  XR shows soft tissue emphysema at the base of the second digit suggesting necrotizing infection.  No evidence of osteomyelitis.  No opaque foreign body. MRI without evidence of osteomyelitis of the right forefoot. S/p I&D on 8/22. Wound cx growing stap aureus, strep agalactiae, sens pending. Continue on IV vanco, flagyl & cefepime as per ID  DM2: poorly controlled w/ HbA1c 11.8. Noncompliant w/ prescribed meds, stopped taking metformin due to diarrhea. Will need insulin at d/c. Continue on glargine, SSI w/ accuchecks   Leukocytosis: secondary to above infection. Continue on IV abxs    DVT prophylaxis: lovenox Code Status: full  Family Communication:  Disposition Plan: unclear  Level of care: Med-Surg  Status is: Inpatient  Remains inpatient appropriate because:Ongoing diagnostic testing needed not appropriate for outpatient work up, Unsafe d/c plan, IV treatments appropriate due to intensity of illness or inability to take PO, and Inpatient level of care appropriate due to severity of illness  Dispo: The patient is from: Home              Anticipated d/c is to: Home              Patient currently is not medically stable to d/c.   Difficult to place patient : unclear   Consultants:  ID Podiatry   Procedures:   Antimicrobials: flagyl, vanco, cefepime    Subjective: Pt c/o malaise  Objective: Vitals:   11/02/20 1956 11/02/20 2358 11/03/20 0613 11/03/20 0735  BP: 139/80 136/74 (!) 165/96 (!) 142/69  Pulse: 92 83 86 85  Resp: '18 18 17 17  '$ Temp:  98.6 F (37 C) 98 F (36.7 C) 98.2 F (36.8 C) 98 F (36.7 C)  TempSrc: Oral Oral    SpO2: 96% 96% 98% 97%  Weight:      Height:        Intake/Output Summary (Last 24 hours) at 11/03/2020 0839 Last data filed at 11/03/2020 0747 Gross per 24 hour  Intake 1739.33 ml  Output 3400 ml  Net -1660.67 ml   Filed Weights   11/01/20 0917 11/01/20 1608  Weight: 85.7 kg 85.7 kg    Examination:  General exam: Appears calm and comfortable  Respiratory system: Clear to auscultation. Respiratory effort normal. Cardiovascular system: S1 & S2 +. No  rubs, gallops or clicks.  Gastrointestinal system: Abdomen is nondistended, soft and nontender.Normal bowel sounds heard. Central nervous system: Alert and oriented. Moves all extremities Extremities: right foot is dressed and dressing is C/D/I Psychiatry: Judgement and insight appear normal. Mood & affect appropriate.     Data Reviewed: I have personally reviewed following labs and imaging studies  CBC: Recent Labs  Lab 11/01/20 0956 11/02/20 0342  WBC 18.2* 18.4*  NEUTROABS 13.9*  --   HGB 13.0 12.1*  HCT 37.2* 35.8*  MCV 92.5 95.0  PLT 202 [id number]   Basic Metabolic Panel: Recent Labs  Lab 11/01/20 0956 11/02/20 0342  NA 134* 134*  K 3.9 4.1  CL 98 102  CO2 25 23  GLUCOSE 278* 254*  BUN 14 17  CREATININE 0.83  0.86  CALCIUM 8.4* 8.1*   GFR: Estimated Creatinine Clearance: 105 mL/min (by C-G formula based on SCr of 0.86 mg/dL). Liver Function Tests: Recent Labs  Lab 11/01/20 0956  AST 11*  ALT 12  ALKPHOS 74  BILITOT 1.6*  PROT 6.2*  ALBUMIN 3.1*   No results for input(s): LIPASE, AMYLASE in the last 168 hours. No results for input(s): AMMONIA in the last 168 hours. Coagulation Profile: Recent Labs  Lab 11/01/20 0956  INR 1.1   Cardiac Enzymes: No results for input(s): CKTOTAL, CKMB, CKMBINDEX, TROPONINI in the last 168 hours. BNP (last 3 results) No results for input(s): PROBNP in the last 8760  hours. HbA1C: Recent Labs    11/01/20 1536  HGBA1C 11.8*   CBG: Recent Labs  Lab 11/02/20 1520 11/02/20 2019 11/02/20 2358 11/03/20 0401 11/03/20 0734  GLUCAP 297* 288* 236* 221* 185*   Lipid Profile: No results for input(s): CHOL, HDL, LDLCALC, TRIG, CHOLHDL, LDLDIRECT in the last 72 hours. Thyroid Function Tests: No results for input(s): TSH, T4TOTAL, FREET4, T3FREE, THYROIDAB in the last 72 hours. Anemia Panel: No results for input(s): VITAMINB12, FOLATE, FERRITIN, TIBC, IRON, RETICCTPCT in the last 72 hours. Sepsis Labs: Recent Labs  Lab 11/01/20 0956  LATICACIDVEN 1.3    Recent Results (from the past 240 hour(s))  Blood culture (single)     Status: None (Preliminary result)   Collection Time: 11/01/20  9:56 AM   Specimen: BLOOD  Result Value Ref Range Status   Specimen Description BLOOD LEFT FA  Final   Special Requests   Final    BOTTLES DRAWN AEROBIC AND ANAEROBIC Blood Culture adequate volume   Culture   Final    NO GROWTH 2 DAYS Performed at [facility], [address], [place], [place] [address]    Report Status PENDING  Incomplete  Aerobic Culture w Gram Stain (superficial specimen)     Status: None (Preliminary result)   Collection Time: 11/01/20 10:26 AM   Specimen: Foot  Result Value Ref Range Status   Specimen Description   Final    FOOT RIGHT Performed at [facility], [address] [place], [place] [address]    Special Requests   Final    NONE Performed at [facility], [address] [place], [place] [address]    Gram Stain   Final    NO ORGANISMS SEEN SQUAMOUS EPITHELIAL CELLS PRESENT RARE WBC PRESENT, PREDOMINANTLY MONONUCLEAR NO ORGANISMS SEEN BACTERIA    Culture   Final    RARE STAPHYLOCOCCUS AUREUS CULTURE REINCUBATED FOR BETTER GROWTH Performed at [facility], [place] [address], [place],  [address]    Report Status PENDING  Incomplete  Resp Panel by RT-PCR (Flu A&B, Covid)  Nasopharyngeal Swab     Status: None   Collection Time: 11/01/20 11:01 AM   Specimen: Nasopharyngeal Swab; Nasopharyngeal(NP) swabs in vial transport medium  Result Value Ref Range Status   SARS Coronavirus 2 by RT PCR NEGATIVE NEGATIVE Final    Comment: (NOTE) SARS-CoV-2 target nucleic acids are NOT DETECTED.  The SARS-CoV-2 RNA is generally detectable in upper respiratory specimens during the acute phase of infection. The lowest concentration of SARS-CoV-2 viral copies this assay can detect is 138 copies/mL. A negative result does not preclude SARS-Cov-2 infection and should not be used as the sole basis for treatment or other patient management decisions. A negative result may occur with  improper specimen collection/handling, submission of specimen other than nasopharyngeal swab, presence of  viral mutation(s) within the areas targeted by this assay, and inadequate number of viral copies(<138 copies/mL). A negative result must be combined with clinical observations, patient history, and epidemiological information. The expected result is Negative.  Fact Sheet for Patients:  EntrepreneurPulse.com.au  Fact Sheet for Healthcare Providers:  IncredibleEmployment.be  This test is no t yet approved or cleared by the [place] FDA and  has been authorized for detection and/or diagnosis of SARS-CoV-2 by FDA under an Emergency Use Authorization (EUA). This EUA will remain  in effect (meaning this test can be used) for the duration of the COVID-19 declaration under Section 564(b)(1) of the Act, 21 U.S.C.section 360bbb-3(b)(1), unless the authorization is terminated  or revoked sooner.       Influenza A by PCR NEGATIVE NEGATIVE Final   Influenza B by PCR NEGATIVE NEGATIVE Final    Comment: (NOTE) The Xpert Xpress SARS-CoV-2/FLU/RSV plus assay is intended as an aid in the diagnosis of influenza from Nasopharyngeal swab specimens and should not be  used as a sole basis for treatment. Nasal washings and aspirates are unacceptable for Xpert Xpress SARS-CoV-2/FLU/RSV testing.  Fact Sheet for Patients: EntrepreneurPulse.com.au  Fact Sheet for Healthcare Providers: IncredibleEmployment.be  This test is not yet approved or cleared by the [place] FDA and has been authorized for detection and/or diagnosis of SARS-CoV-2 by FDA under an Emergency Use Authorization (EUA). This EUA will remain in effect (meaning this test can be used) for the duration of the COVID-19 declaration under Section 564(b)(1) of the Act, 21 U.S.C. section 360bbb-3(b)(1), unless the authorization is terminated or revoked.  Performed at [facility], [place], [place] [address]   Aerobic/Anaerobic Culture w Gram Stain (surgical/deep wound)     Status: None (Preliminary result)   Collection Time: 11/01/20  5:28 PM   Specimen: PATH Other; Tissue  Result Value Ref Range Status   Specimen Description WOUND RIGHT FOOT  Final   Special Requests 2ND TOE  Final   Gram Stain   Final    RARE WBC PRESENT, PREDOMINANTLY MONONUCLEAR ABUNDANT GRAM POSITIVE COCCI    Culture   Final    MODERATE STAPHYLOCOCCUS AUREUS CULTURE REINCUBATED FOR BETTER GROWTH ABUNDANT STREPTOCOCCUS AGALACTIAE TESTING AGAINST S. AGALACTIAE NOT ROUTINELY PERFORMED DUE TO PREDICTABILITY OF AMP/PEN/VAN SUSCEPTIBILITY. Performed at [facility], [place] [address], [place], [place] [address]    Report Status PENDING  Incomplete         Radiology Studies: MR FOOT RIGHT W WO CONTRAST  Result Date: 11/01/2020 CLINICAL DATA:  Foot pain and swelling. History of diabetes and neuropathy. EXAM: MRI OF THE RIGHT FOREFOOT WITHOUT AND WITH CONTRAST TECHNIQUE: Multiplanar, multisequence MR imaging of the right forefoot was performed before and after the administration of intravenous contrast. CONTRAST:  47m GADAVIST GADOBUTROL 1 MMOL/ML  IV SOLN COMPARISON:  None. FINDINGS: Patient motion degrades image quality limiting evaluation. Bones/Joint/Cartilage No fracture or dislocation. Normal alignment. No joint effusion. No marrow signal abnormality. Ligaments Collateral ligaments are intact.  Lisfranc ligament is intact. Muscles and Tendons Flexor, peroneal and extensor compartment tendons are intact. Muscles are normal. Soft tissue No fluid collection or hematoma. No soft tissue mass. Soft tissue edema of the second toe concerning for cellulitis. Soft tissue edema extends into the dorsal aspect of the foot concerning for cellulitis versus reactive edema. IMPRESSION: 1. No osteomyelitis of the right forefoot. 2. Soft tissue edema of the second toe concerning for cellulitis. Soft tissue edema extends into the dorsal aspect of  the foot concerning for cellulitis versus reactive edema. Electronically Signed   By: Kathreen Devoid M.D.   On: 11/01/2020 13:11   DG Foot Complete Right  Result Date: 11/01/2020 CLINICAL DATA:  Injury with blackening of the second toe. Open wound at the second toe base EXAM: RIGHT FOOT COMPLETE - 3+ VIEW COMPARISON:  None. FINDINGS: Soft tissue gas is seen at the base of the second digit. No visible osteomyelitis or septic arthritis. No opaque foreign body. Premature arterial calcification IMPRESSION: Soft tissue emphysema at the base of the second digit suggesting necrotizing infection. No evidence of osteomyelitis. No opaque foreign body. Electronically Signed   By: Monte Fantasia M.D.   On: 11/01/2020 10:09        Scheduled Meds:  (feeding supplement) PROSource Plus  30 mL Oral TID BM   enoxaparin (LOVENOX) injection  40 mg Subcutaneous Q24H   feeding supplement (GLUCERNA SHAKE)  237 mL Oral TID BM   insulin aspart  0-15 Units Subcutaneous Q4H   insulin glargine-yfgn  5 Units Subcutaneous Daily   multivitamin with minerals  1 tablet Oral Daily   nutrition supplement (JUVEN)  1 packet Oral BID BM   Continuous  Infusions:  sodium chloride 100 mL/hr at 11/03/20 0747   ceFEPime (MAXIPIME) IV 2 g (11/03/20 0610)   metronidazole 500 mg (11/03/20 0407)   vancomycin 1,250 mg (11/03/20 0138)     LOS: 2 days    Time spent: 33 mins    Wyvonnia Dusky, MD [facility] Pager [phone]  If 7PM-7AM, please contact night-coverage 11/03/2020, 8:39 AM

## 2020-11-03 NOTE — Progress Notes (Signed)
   Date of Admission:  11/01/2020     ID: Fernando Vega is a 50 y.o. male Principal Problem:   Necrotizing soft tissue infection Active Problems:   Puncture wound   Type 2 diabetes mellitus with peripheral neuropathy (HCC)   Diabetic foot infection ([place])    Subjective: Pt is stable He has no specific complaints   Medications:   (feeding supplement) PROSource Plus  30 mL Oral TID BM   enoxaparin (LOVENOX) injection  40 mg Subcutaneous Q24H   feeding supplement (GLUCERNA SHAKE)  237 mL Oral TID BM   insulin aspart  0-15 Units Subcutaneous Q4H   insulin glargine-yfgn  5 Units Subcutaneous Daily   multivitamin with minerals  1 tablet Oral Daily   nutrition supplement (JUVEN)  1 packet Oral BID BM    Objective: Vital signs in last 24 hours: Temp:  [98 F (36.7 C)-99 F (37.2 C)] 99 F (37.2 C) (08/24 1139) Pulse Rate:  [83-92] 88 (08/24 1139) Resp:  [15-18] 17 (08/24 1139) BP: (136-165)/(69-96) 140/85 (08/24 1139) SpO2:  [94 %-98 %] 94 % (08/24 1139)  PHYSICAL EXAM:  General: Alert, cooperative, no distress, appears stated age.  Lungs: Clear to auscultation bilaterally. No Wheezing or Rhonchi. No rales. Heart: Regular rate and rhythm, no murmur, rub or gallop. Abdomen: Soft, non-tender,not distended. Bowel sounds normal. No masses Extremities: rt foot- dressing- 2nd / third  ischemic toes Skin: No rashes or lesions. Or bruising Lymph: Cervical, supraclavicular normal. Neurologic: Grossly non-focal  Lab Results Recent Labs    11/01/20 0956 11/02/20 0342 11/03/20 0750  WBC 18.2* 18.4*  --   HGB 13.0 12.1*  --   HCT 37.2* 35.8*  --   NA 134* 134*  --   K 3.9 4.1  --   CL 98 102  --   CO2 25 23  --   BUN 14 17  --   CREATININE 0.83 0.86 0.80   Liver Panel Recent Labs    11/01/20 0956  PROT 6.2*  ALBUMIN 3.1*  AST 11*  ALT 12  ALKPHOS 74  BILITOT 1.6*   Sedimentation Rate Recent Labs    11/01/20 1536  ESRSEDRATE 84*   C-Reactive Protein Recent  Labs    11/01/20 1535  CRP 37.9*    Microbiology: Staph aureus and GBS strep in surgical culture    Assessment/Plan: Puncture wound infection rt foot with diabetes S/p I/D of abscess at multiple plane levels Ischemic necrosis Staph aureus and GBS in wound culture so far On vanco/cefepime+ flagyl Will DC cefepime and flagyl Pt will need amputation of the 2nd and 3rd toe due to evolving gangrene necrosis   Leucocytosis ? DM - - on insulin now    Discussed the management with the patient and care team

## 2020-11-04 LAB — CBC
HCT: 35 % — ABNORMAL LOW (ref 39.0–52.0)
Hemoglobin: 11.5 g/dL — ABNORMAL LOW (ref 13.0–17.0)
MCH: 31.6 pg (ref 26.0–34.0)
MCHC: 32.9 g/dL (ref 30.0–36.0)
MCV: 96.2 fL (ref 80.0–100.0)
Platelets: 258 10*3/uL (ref 150–400)
RBC: 3.64 MIL/uL — ABNORMAL LOW (ref 4.22–5.81)
RDW: 11.9 % (ref 11.5–15.5)
WBC: 13.4 10*3/uL — ABNORMAL HIGH (ref 4.0–10.5)
nRBC: 0.2 % (ref 0.0–0.2)

## 2020-11-04 LAB — GLUCOSE, CAPILLARY
Glucose-Capillary: 163 mg/dL — ABNORMAL HIGH (ref 70–99)
Glucose-Capillary: 175 mg/dL — ABNORMAL HIGH (ref 70–99)
Glucose-Capillary: 196 mg/dL — ABNORMAL HIGH (ref 70–99)
Glucose-Capillary: 234 mg/dL — ABNORMAL HIGH (ref 70–99)
Glucose-Capillary: 240 mg/dL — ABNORMAL HIGH (ref 70–99)
Glucose-Capillary: 243 mg/dL — ABNORMAL HIGH (ref 70–99)
Glucose-Capillary: 271 mg/dL — ABNORMAL HIGH (ref 70–99)

## 2020-11-04 LAB — BASIC METABOLIC PANEL
Anion gap: 7 (ref 5–15)
BUN: 18 mg/dL (ref 6–20)
CO2: 28 mmol/L (ref 22–32)
Calcium: 7.9 mg/dL — ABNORMAL LOW (ref 8.9–10.3)
Chloride: 101 mmol/L (ref 98–111)
Creatinine, Ser: 0.65 mg/dL (ref 0.61–1.24)
GFR, Estimated: >60 mL/min (ref >60)
Glucose, Bld: 182 mg/dL — ABNORMAL HIGH (ref 70–99)
Potassium: 3.6 mmol/L (ref 3.5–5.1)
Sodium: 136 mmol/L (ref 135–145)

## 2020-11-04 LAB — AEROBIC CULTURE W GRAM STAIN (SUPERFICIAL SPECIMEN)

## 2020-11-04 MED ORDER — SODIUM CHLORIDE 0.9 % IV SOLN: 3.0000 g | Freq: Four times a day (QID) | INTRAVENOUS | Status: DC

## 2020-11-04 MED ORDER — ACETAMINOPHEN 325 MG PO TABS
650.0000 mg | ORAL_TABLET | Freq: Four times a day (QID) | ORAL | Status: DC | PRN
  Administered 2020-11-04: 650 mg via ORAL
  Filled 2020-11-04: qty 2

## 2020-11-04 MED ORDER — CEFAZOLIN SODIUM-DEXTROSE 2-4 GM/100ML-% IV SOLN
2.0000 g | Freq: Three times a day (TID) | INTRAVENOUS | Status: DC
  Administered 2020-11-04 – 2020-11-16 (×36): 2 g via INTRAVENOUS
  Filled 2020-11-04 (×29): qty 100

## 2020-11-04 NOTE — Progress Notes (Signed)
  Subjective:  Patient ID: Fernando Vega, male    DOB: Jun 28, 1970,  MRN: [id number]  Overall he is feeling well is not having much pain  Negative for chest pain and shortness of breath Chest pain: no Shortness of breath: no Fever: no Night sweats: no Constitutional signs: no Objective:   Vitals:   11/03/20 2009 11/04/20 0421  BP: 131/75 128/76  Pulse: 86 83  Resp: 18 16  Temp: 98.8 F (37.1 C) 98.3 F (36.8 C)  SpO2: 97% 96%   General AA&O x3. Normal mood and affect.  Vascular Dorsalis pedis and posterior tibial pulses 2/4 bilat cyanotic second and third toes with poor cap refill  Neurologic Epicritic sensation grossly intact.  Dermatologic No purulence the cellulitis is improving  Orthopedic: MMT 5/5       Assessment & Plan:  Patient was evaluated and treated and all questions answered.  Right foot diabetic foot infection -Unfortunately looks like he is going to require amputation of second and third toes, possible metatarsal head resections for closure -Continue antibiotics per infectious disease, has been having some diarrhea and I think they can probably DC the Flagyl at this point -Trend WBC daily -Plan to go back to the OR on Friday for amputation with Dr. Alanda Amass, DPM  Accessible via secure chat for questions or concerns.

## 2020-11-04 NOTE — Progress Notes (Signed)
ID Pt stable Has some diarrhea No pain abdomen No fever  O/e awake and alert Patient Vitals for the past 24 hrs:  BP Temp Temp src Pulse Resp SpO2 Weight  11/04/20 1600 (!) 152/88 98.1 F (36.7 C) -- 94 18 95 % --  11/04/20 1204 (!) 161/89 99 F (37.2 C) -- 87 16 96 % --  11/04/20 0800 (!) 148/88 97.8 F (36.6 C) -- 87 14 95 % --  11/04/20 0500 -- -- -- -- -- -- 95.3 kg  11/04/20 0421 128/76 98.3 F (36.8 C) Oral 83 16 96 % --  11/03/20 2009 131/75 98.8 F (37.1 C) -- 86 18 97 % --    Chest cta Hss1s2 Abd soft No tenderness Umbilical hernia reducible Rt foot- ischemic 2nd and 3rd toe     On admission   Labs CBC Latest Ref Rng & Units 11/04/2020 11/02/2020 11/01/2020  WBC 4.0 - 10.5 K/uL 13.4(H) 18.4(H) 18.2(H)  Hemoglobin 13.0 - 17.0 g/dL 11.5(L) 12.1(L) 13.0  Hematocrit 39.0 - 52.0 % 35.0(L) 35.8(L) 37.2(L)  Platelets 150 - 400 K/uL 258 218 202     CMP Latest Ref Rng & Units 11/04/2020 11/03/2020 11/02/2020  Glucose 70 - 99 mg/dL 182(H) - 254(H)  BUN 6 - 20 mg/dL 18 - 17  Creatinine 0.61 - 1.24 mg/dL 0.65 0.80 0.86  Sodium 135 - 145 mmol/L 136 - 134(L)  Potassium 3.5 - 5.1 mmol/L 3.6 - 4.1  Chloride 98 - 111 mmol/L 101 - 102  CO2 22 - 32 mmol/L 28 - 23  Calcium 8.9 - 10.3 mg/dL 7.9(L) - 8.1(L)  Total Protein 6.5 - 8.1 g/dL - - -  Total Bilirubin 0.3 - 1.2 mg/dL - - -  Alkaline Phos 38 - 126 U/L - - -  AST 15 - 41 U/L - - -  ALT 0 - 44 U/L - - -    MICRO MSSA and GBS in the culture  Impression/recommendation  Diabetic foot infection with necrosis rt foot s/p debridement- but has worsening ischemia /gangrene Going back for amputation of 2/3/toes tomorrow MSSA and GBS in wound- now on cefazolin Leucocytosis improving  Diarrhea- very likely due to combination of 3 different supplements-prosure/glucerna/juven and antibiotics No suspicion for Cdiff now Would recommend stopping or making change to supplements  DM on insulin  Discussed the management with  the patient

## 2020-11-04 NOTE — Progress Notes (Signed)
PROGRESS NOTE    Fernando Vega  [id number] DOB: May 23, 1970 DOA: 11/01/2020 PCP: Patient, No Pcp Per (Inactive)  Assessment & Plan:   Principal Problem:   Necrotizing soft tissue infection Active Problems:   Puncture wound   Type 2 diabetes mellitus with peripheral neuropathy (HCC)   Diabetic foot infection ([place])  Necrotizing soft tissue infection of right foot: involving the right second toe, following a puncture wound. S/p I&D on 8/22. Wound cx growing stap aureus, strep agalactiae, sens pending still. Continue on IV vanco as per ID. Will go for likely amputation of 2nd and 3rd toe tomorrow as per podiatry   DM2: poorly controlled w/ HbA1c 11.8. Noncompliant w/ prescribed meds, stopped taking metformin due to diarrhea. Will need to insulin at d/c. Continue on glargine, SSI w/ accuchecks    Leukocytosis: secondary to above infection. Continue on IV abxs     DVT prophylaxis: lovenox Code Status: full  Family Communication:  Disposition Plan: unclear  Level of care: Med-Surg  Status is: Inpatient  Remains inpatient appropriate because:Ongoing diagnostic testing needed not appropriate for outpatient work up, Unsafe d/c plan, IV treatments appropriate due to intensity of illness or inability to take PO, and Inpatient level of care appropriate due to severity of illness  Dispo: The patient is from: Home              Anticipated d/c is to: Home              Patient currently is not medically stable to d/c.   Difficult to place patient : unclear   Consultants:  ID Podiatry   Procedures:   Antimicrobials: vanco    Subjective: Pt c/o fatigue  Objective: Vitals:   11/03/20 1537 11/03/20 2009 11/04/20 0421 11/04/20 0500  BP: (!) 104/49 131/75 128/76   Pulse: 89 86 83   Resp:  18 16   Temp:  98.8 F (37.1 C) 98.3 F (36.8 C)   TempSrc:   Oral   SpO2:  97% 96%   Weight:    95.3 kg  Height:        Intake/Output Summary (Last 24 hours) at 11/04/2020 0745 Last  data filed at 11/04/2020 0334 Gross per 24 hour  Intake 4082.61 ml  Output --  Net 4082.61 ml   Filed Weights   11/01/20 0917 11/01/20 1608 11/04/20 0500  Weight: 85.7 kg 85.7 kg 95.3 kg    Examination:  General exam: Appears comfortable  Respiratory system: Clear breath sounds b/l Cardiovascular system: S1/S2+. No rubs or clicks  Gastrointestinal system: Abd is soft, NT, ND & hypoactive bowel sounds  Central nervous system: Alert and oriented. Moves all extremities  Extremities: right foot is dressed and dressing is C/D/I Psychiatry: Judgement and insight appear normal. Appropriate mood and affect      Data Reviewed: I have personally reviewed following labs and imaging studies  CBC: Recent Labs  Lab 11/01/20 0956 11/02/20 0342 11/04/20 0435  WBC 18.2* 18.4* 13.4*  NEUTROABS 13.9*  --   --   HGB 13.0 12.1* 11.5*  HCT 37.2* 35.8* 35.0*  MCV 92.5 95.0 96.2  PLT 202 218 0000000   Basic Metabolic Panel: Recent Labs  Lab 11/01/20 0956 11/02/20 0342 11/03/20 0750 11/04/20 0435  NA 134* 134*  --  136  K 3.9 4.1  --  3.6  CL 98 102  --  101  CO2 25 23  --  28  GLUCOSE 278* 254*  --  182*  BUN 14  17  --  18  CREATININE 0.83 0.86 0.80 0.65  CALCIUM 8.4* 8.1*  --  7.9*   GFR: Estimated Creatinine Clearance: 118.9 mL/min (by C-G formula based on SCr of 0.65 mg/dL). Liver Function Tests: Recent Labs  Lab 11/01/20 0956  AST 11*  ALT 12  ALKPHOS 74  BILITOT 1.6*  PROT 6.2*  ALBUMIN 3.1*   No results for input(s): LIPASE, AMYLASE in the last 168 hours. No results for input(s): AMMONIA in the last 168 hours. Coagulation Profile: Recent Labs  Lab 11/01/20 0956  INR 1.1   Cardiac Enzymes: No results for input(s): CKTOTAL, CKMB, CKMBINDEX, TROPONINI in the last 168 hours. BNP (last 3 results) No results for input(s): PROBNP in the last 8760 hours. HbA1C: Recent Labs    11/01/20 1536  HGBA1C 11.8*   CBG: Recent Labs  Lab 11/03/20 1137 11/03/20 1558  11/03/20 2008 11/04/20 0053 11/04/20 0418  GLUCAP 304* 324* 282* 243* 196*   Lipid Profile: No results for input(s): CHOL, HDL, LDLCALC, TRIG, CHOLHDL, LDLDIRECT in the last 72 hours. Thyroid Function Tests: No results for input(s): TSH, T4TOTAL, FREET4, T3FREE, THYROIDAB in the last 72 hours. Anemia Panel: No results for input(s): VITAMINB12, FOLATE, FERRITIN, TIBC, IRON, RETICCTPCT in the last 72 hours. Sepsis Labs: Recent Labs  Lab 11/01/20 0956  LATICACIDVEN 1.3    Recent Results (from the past 240 hour(s))  Blood culture (single)     Status: None (Preliminary result)   Collection Time: 11/01/20  9:56 AM   Specimen: BLOOD  Result Value Ref Range Status   Specimen Description BLOOD LEFT FA  Final   Special Requests   Final    BOTTLES DRAWN AEROBIC AND ANAEROBIC Blood Culture adequate volume   Culture   Final    NO GROWTH 3 DAYS Performed at [facility], [address] [place], [place] [address]    Report Status PENDING  Incomplete  Aerobic Culture w Gram Stain (superficial specimen)     Status: None (Preliminary result)   Collection Time: 11/01/20 10:26 AM   Specimen: Foot  Result Value Ref Range Status   Specimen Description   Final    FOOT RIGHT Performed at [facility], [address], [place], [place] [address]    Special Requests   Final    NONE Performed at [facility], [address] [place], [place] [address]    Gram Stain   Final    NO ORGANISMS SEEN SQUAMOUS EPITHELIAL CELLS PRESENT RARE WBC PRESENT, PREDOMINANTLY MONONUCLEAR NO ORGANISMS SEEN BACTERIA    Culture   Final    RARE STAPHYLOCOCCUS AUREUS SUSCEPTIBILITIES TO FOLLOW RARE STREPTOCOCCUS AGALACTIAE TESTING AGAINST S. AGALACTIAE NOT ROUTINELY PERFORMED DUE TO PREDICTABILITY OF AMP/PEN/VAN SUSCEPTIBILITY. Performed at [facility], [place] [address], [place], [place] [address]    Report Status PENDING  Incomplete  Resp Panel by RT-PCR (Flu A&B, Covid)  Nasopharyngeal Swab     Status: None   Collection Time: 11/01/20 11:01 AM   Specimen: Nasopharyngeal Swab; Nasopharyngeal(NP) swabs in vial transport medium  Result Value Ref Range Status   SARS Coronavirus 2 by RT PCR NEGATIVE NEGATIVE Final    Comment: (NOTE) SARS-CoV-2 target nucleic acids are NOT DETECTED.  The SARS-CoV-2 RNA is generally detectable in upper respiratory specimens during the acute phase of infection. The lowest concentration of SARS-CoV-2 viral copies this assay can detect is 138 copies/mL. A negative result does not preclude SARS-Cov-2 infection and should not be used as the  sole basis for treatment or other patient management decisions. A negative result may occur with  improper specimen collection/handling, submission of specimen other than nasopharyngeal swab, presence of viral mutation(s) within the areas targeted by this assay, and inadequate number of viral copies(<138 copies/mL). A negative result must be combined with clinical observations, patient history, and epidemiological information. The expected result is Negative.  Fact Sheet for Patients:  EntrepreneurPulse.com.au  Fact Sheet for Healthcare Providers:  IncredibleEmployment.be  This test is no t yet approved or cleared by the [place] FDA and  has been authorized for detection and/or diagnosis of SARS-CoV-2 by FDA under an Emergency Use Authorization (EUA). This EUA will remain  in effect (meaning this test can be used) for the duration of the COVID-19 declaration under Section 564(b)(1) of the Act, 21 U.S.C.section 360bbb-3(b)(1), unless the authorization is terminated  or revoked sooner.       Influenza A by PCR NEGATIVE NEGATIVE Final   Influenza B by PCR NEGATIVE NEGATIVE Final    Comment: (NOTE) The Xpert Xpress SARS-CoV-2/FLU/RSV plus assay is intended as an aid in the diagnosis of influenza from Nasopharyngeal swab specimens and should not be  used as a sole basis for treatment. Nasal washings and aspirates are unacceptable for Xpert Xpress SARS-CoV-2/FLU/RSV testing.  Fact Sheet for Patients: EntrepreneurPulse.com.au  Fact Sheet for Healthcare Providers: IncredibleEmployment.be  This test is not yet approved or cleared by the [place] FDA and has been authorized for detection and/or diagnosis of SARS-CoV-2 by FDA under an Emergency Use Authorization (EUA). This EUA will remain in effect (meaning this test can be used) for the duration of the COVID-19 declaration under Section 564(b)(1) of the Act, 21 U.S.C. section 360bbb-3(b)(1), unless the authorization is terminated or revoked.  Performed at [facility], [place], [place] [address]   Aerobic/Anaerobic Culture w Gram Stain (surgical/deep wound)     Status: None (Preliminary result)   Collection Time: 11/01/20  5:28 PM   Specimen: PATH Other; Tissue  Result Value Ref Range Status   Specimen Description WOUND RIGHT FOOT  Final   Special Requests 2ND TOE  Final   Gram Stain   Final    RARE WBC PRESENT, PREDOMINANTLY MONONUCLEAR ABUNDANT GRAM POSITIVE COCCI Performed at [facility], [place] [address] [place], [place] [address]    Culture   Final    MODERATE STAPHYLOCOCCUS AUREUS SUSCEPTIBILITIES TO FOLLOW ABUNDANT STREPTOCOCCUS AGALACTIAE TESTING AGAINST S. AGALACTIAE NOT ROUTINELY PERFORMED DUE TO PREDICTABILITY OF AMP/PEN/VAN SUSCEPTIBILITY. NO ANAEROBES ISOLATED; CULTURE IN PROGRESS FOR 5 DAYS    Report Status PENDING  Incomplete         Radiology Studies: No results found.      Scheduled Meds:  (feeding supplement) PROSource Plus  30 mL Oral TID BM   calcium carbonate  400 mg of elemental calcium Oral BID   enoxaparin (LOVENOX) injection  40 mg Subcutaneous Q24H   feeding supplement (GLUCERNA SHAKE)  237 mL Oral TID BM   insulin aspart  0-15 Units Subcutaneous Q4H   insulin  glargine-yfgn  5 Units Subcutaneous Daily   multivitamin with minerals  1 tablet Oral Daily   nutrition supplement (JUVEN)  1 packet Oral BID BM   Continuous Infusions:  sodium chloride 100 mL/hr at 11/04/20 0058   vancomycin 1,250 mg (11/04/20 0103)     LOS: 3 days    Time spent: 30 mins    Wyvonnia Dusky, MD [facility] Pager [phone]  If 7PM-7AM, please contact night-coverage 11/04/2020, 7:45 AM

## 2020-11-04 NOTE — Progress Notes (Signed)
Inpatient Diabetes Program Recommendations  AACE/ADA: New Consensus Statement on Inpatient Glycemic Control (2015)  Target Ranges:  Prepandial:   less than 140 mg/dL      Peak postprandial:   less than 180 mg/dL (1-2 hours)      Critically ill patients:  140 - 180 mg/dL   Results for FRED, ADJEI (MRN [id number]) as of 11/04/2020 08:54  Ref. Range 11/03/2020 07:34 11/03/2020 11:37 11/03/2020 15:58 11/03/2020 20:08  Glucose-Capillary Latest Ref Range: 70 - 99 mg/dL 185 (H)  3 units Novolog  5 units Semglee '@0935'$   304 (H)  11 units Novolog  324 (H)  11 units Novolog  282 (H)  8 units Novolog   Results for STRIDER, ALI (MRN [id number]) as of 11/04/2020 08:54  Ref. Range 11/04/2020 08:04  Glucose-Capillary Latest Ref Range: 70 - 99 mg/dL 163 (H)  3 units Novolog     Home DM Meds: None  Current Orders: Semglee 5 units Daily     Novolog Moderate Correction Scale/ SSI (0-15 units) Q4 hours   No Insurance No PCP TOC team working with patient   MD- Please consider:  1. Increase Semglee slightly to 7 units Daily  2. Change Novolog SSI to TID AC + HS (currently ordered Q4 hours and pt eating meals)  3. Start Novolog Meal Coverage: Novolog 6 units TID with meals Hold if pt eats <50% of meal, Hold if pt NPO    --Will follow patient during hospitalization--  Wyn Quaker RN, MSN, CDE Diabetes Coordinator Inpatient Glycemic Control Team Team Pager: [phone] (8a-5p)

## 2020-11-05 ENCOUNTER — Inpatient Hospital Stay: Payer: Medicaid Other

## 2020-11-05 ENCOUNTER — Encounter
Admission: EM | Disposition: A | Discharge status: Home / Self Care | Payer: Self-pay | Source: Home / Self Care | Attending: Internal Medicine

## 2020-11-05 ENCOUNTER — Inpatient Hospital Stay: Payer: Medicaid Other | Admitting: Anesthesiology

## 2020-11-05 ENCOUNTER — Encounter: Payer: Self-pay | Admitting: Internal Medicine

## 2020-11-05 DIAGNOSIS — L03116 Cellulitis of left lower limb: Secondary | ICD-10-CM

## 2020-11-05 DIAGNOSIS — I96 Gangrene, not elsewhere classified: Secondary | ICD-10-CM

## 2020-11-05 DIAGNOSIS — M86671 Other chronic osteomyelitis, right ankle and foot: Secondary | ICD-10-CM

## 2020-11-05 HISTORY — PX: AMPUTATION: SHX166

## 2020-11-05 LAB — CBC
HCT: 35.7 % — ABNORMAL LOW (ref 39.0–52.0)
Hemoglobin: 11.7 g/dL — ABNORMAL LOW (ref 13.0–17.0)
MCH: 30.4 pg (ref 26.0–34.0)
MCHC: 32.8 g/dL (ref 30.0–36.0)
MCV: 92.7 fL (ref 80.0–100.0)
Platelets: 283 10*3/uL (ref 150–400)
RBC: 3.85 MIL/uL — ABNORMAL LOW (ref 4.22–5.81)
RDW: 11.8 % (ref 11.5–15.5)
WBC: 11.7 10*3/uL — ABNORMAL HIGH (ref 4.0–10.5)
nRBC: 0.3 % — ABNORMAL HIGH (ref 0.0–0.2)

## 2020-11-05 LAB — GLUCOSE, CAPILLARY
Glucose-Capillary: 110 mg/dL — ABNORMAL HIGH (ref 70–99)
Glucose-Capillary: 115 mg/dL — ABNORMAL HIGH (ref 70–99)
Glucose-Capillary: 125 mg/dL — ABNORMAL HIGH (ref 70–99)
Glucose-Capillary: 143 mg/dL — ABNORMAL HIGH (ref 70–99)
Glucose-Capillary: 181 mg/dL — ABNORMAL HIGH (ref 70–99)
Glucose-Capillary: 216 mg/dL — ABNORMAL HIGH (ref 70–99)
Glucose-Capillary: 242 mg/dL — ABNORMAL HIGH (ref 70–99)

## 2020-11-05 LAB — BASIC METABOLIC PANEL
Anion gap: 7 (ref 5–15)
BUN: 13 mg/dL (ref 6–20)
CO2: 27 mmol/L (ref 22–32)
Calcium: 7.8 mg/dL — ABNORMAL LOW (ref 8.9–10.3)
Chloride: 101 mmol/L (ref 98–111)
Creatinine, Ser: 0.58 mg/dL — ABNORMAL LOW (ref 0.61–1.24)
GFR, Estimated: >60 mL/min (ref >60)
Glucose, Bld: 162 mg/dL — ABNORMAL HIGH (ref 70–99)
Potassium: 3.7 mmol/L (ref 3.5–5.1)
Sodium: 135 mmol/L (ref 135–145)

## 2020-11-05 SURGERY — AMPUTATION, FOOT, RAY
Anesthesia: General | Site: Foot | Laterality: Right

## 2020-11-05 MED ORDER — OXYCODONE-ACETAMINOPHEN 5-325 MG PO TABS
1.0000 | ORAL_TABLET | ORAL | Status: DC | PRN
  Administered 2020-11-08 – 2020-11-16 (×6): 2 via ORAL
  Filled 2020-11-05 (×6): qty 2

## 2020-11-05 MED ORDER — FENTANYL CITRATE (PF) 100 MCG/2ML IJ SOLN: 25.0000 ug | INTRAMUSCULAR | Status: DC | PRN

## 2020-11-05 MED ORDER — MIDAZOLAM HCL 2 MG/2ML IJ SOLN
INTRAMUSCULAR | Status: AC
  Filled 2020-11-05: qty 2

## 2020-11-05 MED ORDER — PROMETHAZINE HCL 25 MG/ML IJ SOLN: 6.2500 mg | INTRAMUSCULAR | Status: DC | PRN

## 2020-11-05 MED ORDER — DEXMEDETOMIDINE (PRECEDEX) IN NS 20 MCG/5ML (4 MCG/ML) IV SYRINGE
PREFILLED_SYRINGE | INTRAVENOUS | Status: DC | PRN
  Administered 2020-11-05: 12 ug via INTRAVENOUS
  Administered 2020-11-05: 8 ug via INTRAVENOUS

## 2020-11-05 MED ORDER — OXYCODONE HCL 5 MG/5ML PO SOLN: 5.0000 mg | Freq: Once | ORAL | Status: DC | PRN

## 2020-11-05 MED ORDER — PROPOFOL 10 MG/ML IV BOLUS
INTRAVENOUS | Status: DC | PRN
  Administered 2020-11-05: 50 mg via INTRAVENOUS

## 2020-11-05 MED ORDER — LIDOCAINE HCL 1 % IJ SOLN
INTRAMUSCULAR | Status: DC | PRN
  Administered 2020-11-05: 20 mL

## 2020-11-05 MED ORDER — PROPOFOL 500 MG/50ML IV EMUL
INTRAVENOUS | Status: DC | PRN
  Administered 2020-11-05: 50 ug/kg/min via INTRAVENOUS

## 2020-11-05 MED ORDER — MORPHINE SULFATE (PF) 2 MG/ML IV SOLN
2.0000 mg | INTRAVENOUS | Status: DC | PRN
Start: 2020-11-05 — End: 2020-11-14
  Administered 2020-11-05 – 2020-11-07 (×2): 2 mg via INTRAVENOUS
  Filled 2020-11-05 (×2): qty 1

## 2020-11-05 MED ORDER — VANCOMYCIN HCL 1000 MG IV SOLR
INTRAVENOUS | Status: DC | PRN
  Administered 2020-11-05: 1250 mg via INTRAVENOUS

## 2020-11-05 MED ORDER — METRONIDAZOLE 500 MG/100ML IV SOLN: 500.0000 mg | Freq: Two times a day (BID) | INTRAVENOUS | Status: DC

## 2020-11-05 MED ORDER — MIDAZOLAM HCL 2 MG/2ML IJ SOLN
INTRAMUSCULAR | Status: DC | PRN
  Administered 2020-11-05: 2 mg via INTRAVENOUS

## 2020-11-05 MED ORDER — LIDOCAINE HCL (PF) 1 % IJ SOLN
INTRAMUSCULAR | Status: AC
  Filled 2020-11-05: qty 30

## 2020-11-05 MED ORDER — MORPHINE SULFATE (PF) 4 MG/ML IV SOLN
4.0000 mg | INTRAVENOUS | Status: DC | PRN
  Administered 2020-11-06 – 2020-11-09 (×3): 4 mg via INTRAVENOUS
  Filled 2020-11-05 (×3): qty 1

## 2020-11-05 MED ORDER — OXYCODONE HCL 5 MG PO TABS
5.0000 mg | ORAL_TABLET | Freq: Once | ORAL | Status: DC | PRN
Start: 2020-11-05 — End: 2020-11-05

## 2020-11-05 MED ORDER — MEPERIDINE HCL 25 MG/ML IJ SOLN: 6.2500 mg | INTRAMUSCULAR | Status: DC | PRN

## 2020-11-05 MED ORDER — 0.9 % SODIUM CHLORIDE (POUR BTL) OPTIME
TOPICAL | Status: DC | PRN
  Administered 2020-11-05: 500 mL

## 2020-11-05 MED ORDER — PROMETHAZINE HCL 25 MG PO TABS
12.5000 mg | ORAL_TABLET | ORAL | Status: DC | PRN
  Filled 2020-11-05: qty 1

## 2020-11-05 MED ORDER — PROPOFOL 10 MG/ML IV BOLUS
INTRAVENOUS | Status: AC
  Filled 2020-11-05: qty 20

## 2020-11-05 SURGICAL SUPPLY — 45 items
BLADE MED AGGRESSIVE (BLADE) ×2 IMPLANT
BNDG ELASTIC 4X5.8 VLCR STR LF (GAUZE/BANDAGES/DRESSINGS) ×2 IMPLANT
BNDG ESMARK 4X12 TAN STRL LF (GAUZE/BANDAGES/DRESSINGS) ×2 IMPLANT
BNDG GAUZE ELAST 4 BULKY (GAUZE/BANDAGES/DRESSINGS) ×2 IMPLANT
CANISTER SUCT 1200ML W/VALVE (MISCELLANEOUS) ×2 IMPLANT
CNTNR SPEC 2.5X3XGRAD LEK (MISCELLANEOUS) ×1
CONT SPEC 4OZ STER OR WHT (MISCELLANEOUS) ×1
CONTAINER SPEC 2.5X3XGRAD LEK (MISCELLANEOUS) ×1 IMPLANT
CUFF TOURN SGL QUICK 18X4 (TOURNIQUET CUFF) IMPLANT
DURAPREP 26ML APPLICATOR (WOUND CARE) ×2 IMPLANT
ELECT REM PT RETURN 9FT ADLT (ELECTROSURGICAL) ×2
ELECTRODE REM PT RTRN 9FT ADLT (ELECTROSURGICAL) ×1 IMPLANT
GAUZE 4X4 16PLY ~~LOC~~+RFID DBL (SPONGE) ×2 IMPLANT
GAUZE PACKING FOLDED 1IN STRL (GAUZE/BANDAGES/DRESSINGS) ×1 IMPLANT
GAUZE SPONGE 4X4 12PLY STRL (GAUZE/BANDAGES/DRESSINGS) ×4 IMPLANT
GAUZE XEROFORM 1X8 LF (GAUZE/BANDAGES/DRESSINGS) ×2 IMPLANT
GLOVE SURG ENC MOIS LTX SZ7 (GLOVE) ×2 IMPLANT
GLOVE SURG UNDER LTX SZ7 (GLOVE) ×2 IMPLANT
GOWN STRL REUS W/ TWL LRG LVL3 (GOWN DISPOSABLE) ×2 IMPLANT
GOWN STRL REUS W/TWL LRG LVL3 (GOWN DISPOSABLE) ×2
HANDPIECE INTERPULSE COAX TIP (DISPOSABLE) ×1
HEMOSTAT SURGICEL 2X3 (HEMOSTASIS) ×3 IMPLANT
KIT TURNOVER KIT A (KITS) ×2 IMPLANT
LABEL OR SOLS (LABEL) ×2 IMPLANT
MANIFOLD NEPTUNE II (INSTRUMENTS) ×2 IMPLANT
NDL FILTER BLUNT 18X1 1/2 (NEEDLE) ×1 IMPLANT
NDL HYPO 25X1 1.5 SAFETY (NEEDLE) ×2 IMPLANT
NEEDLE FILTER BLUNT 18X 1/2SAF (NEEDLE) ×1
NEEDLE FILTER BLUNT 18X1 1/2 (NEEDLE) ×1 IMPLANT
NEEDLE HYPO 25X1 1.5 SAFETY (NEEDLE) ×4 IMPLANT
NS IRRIG 500ML POUR BTL (IV SOLUTION) ×2 IMPLANT
PACK EXTREMITY ARMC (MISCELLANEOUS) ×2 IMPLANT
PAD ABD DERMACEA PRESS 5X9 (GAUZE/BANDAGES/DRESSINGS) ×1 IMPLANT
PAD CAST 4YDX4 CTTN HI CHSV (CAST SUPPLIES) IMPLANT
PADDING CAST COTTON 4X4 STRL (CAST SUPPLIES) ×1
SET HNDPC FAN SPRY TIP SCT (DISPOSABLE) IMPLANT
SOL PREP PVP 2OZ (MISCELLANEOUS) ×2
SOLUTION PREP PVP 2OZ (MISCELLANEOUS) ×1 IMPLANT
STOCKINETTE STRL 6IN 960660 (GAUZE/BANDAGES/DRESSINGS) ×2 IMPLANT
SUT ETHILON 3-0 FS-10 30 BLK (SUTURE) ×4
SUT VIC AB 3-0 SH 27 (SUTURE) ×1
SUT VIC AB 3-0 SH 27X BRD (SUTURE) ×1 IMPLANT
SUTURE EHLN 3-0 FS-10 30 BLK (SUTURE) ×2 IMPLANT
SYR 10ML LL (SYRINGE) ×2 IMPLANT
WATER STERILE IRR 500ML POUR (IV SOLUTION) ×2 IMPLANT

## 2020-11-05 NOTE — Anesthesia Preprocedure Evaluation (Signed)
Anesthesia Evaluation  Patient identified by MRN, date of birth, ID band Patient awake    Reviewed: Allergy & Precautions, NPO status , Patient's Chart, lab work & pertinent test results  History of Anesthesia Complications Negative for: history of anesthetic complications  Airway Mallampati: II  TM Distance: >3 FB Neck ROM: Full    Dental  (+) Edentulous Upper, Edentulous Lower   Pulmonary sleep apnea (noncompliant with CPAP) , neg COPD,    breath sounds clear to auscultation- rhonchi (-) wheezing      Cardiovascular (-) hypertension(-) CAD, (-) Past MI, (-) Cardiac Stents and (-) CABG  Rhythm:Regular Rate:Normal - Systolic murmurs and - Diastolic murmurs    Neuro/Psych neg Seizures negative neurological ROS  negative psych ROS   GI/Hepatic negative GI ROS, Neg liver ROS,   Endo/Other  diabetes, Poorly Controlled  Renal/GU negative Renal ROS     Musculoskeletal negative musculoskeletal ROS (+)   Abdominal (+) + obese,   Peds  Hematology negative hematology ROS (+)   Anesthesia Other Findings Past Medical History: No date: Diabetes mellitus without complication (HCC) No date: Neuropathy No date: Sarcoidosis No date: Sleep apnea   Reproductive/Obstetrics                             Anesthesia Physical Anesthesia Plan  ASA: 3  Anesthesia Plan: General   Post-op Pain Management:    Induction: Intravenous  PONV Risk Score and Plan: 1 and Propofol infusion  Airway Management Planned: Natural Airway  Additional Equipment:   Intra-op Plan:   Post-operative Plan:   Informed Consent: I have reviewed the patients History and Physical, chart, labs and discussed the procedure including the risks, benefits and alternatives for the proposed anesthesia with the patient or authorized representative who has indicated his/her understanding and acceptance.     Dental advisory  given  Plan Discussed with: CRNA and Anesthesiologist  Anesthesia Plan Comments:         Anesthesia Quick Evaluation

## 2020-11-05 NOTE — Brief Op Note (Signed)
11/05/2020  5:03 PM  PATIENT:  Fernando Vega  50 y.o. 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  POST-OPERATIVE DIAGNOSIS:  gangrene to 2nd and 3rd digit right foot  PROCEDURE:  Procedure(s): AMPUTATION RAY-Partial Ray Amp 2nd & 3rd Digit (Right)  SURGEON:  Surgeon(s) and Role:    Edrick Kins, DPM - Primary  PHYSICIAN ASSISTANT:   ASSISTANTS: none   ANESTHESIA:   local and general  EBL:  minimal  BLOOD ADMINISTERED:none  DRAINS: none   LOCAL MEDICATIONS USED:  MARCAINE    and LIDOCAINE   SPECIMEN:  Source of Specimen:  RT foot  DISPOSITION OF SPECIMEN:  PATHOLOGY  COUNTS:  YES  TOURNIQUET:   Total Tourniquet Time Documented: Calf (Right) - 33 minutes Total: Calf (Right) - 33 minutes   DICTATION: .Viviann Spare Dictation  PLAN OF CARE: Admit to inpatient   PATIENT DISPOSITION:  PACU - hemodynamically stable.   Delay start of Pharmacological VTE agent (>24hrs) due to surgical blood loss or risk of bleeding: no

## 2020-11-05 NOTE — Op Note (Signed)
OPERATIVE REPORT Patient name: Fernando Vega MRN: [id number] DOB: 19-Apr-1970  DOS: 11/05/2020  Preop Dx: Gangrene right second and third digits with abscess Postop Dx: same  Procedure:  1.  Partial second and third ray amputation RT foot 2.  Incision and drainage RT foot 3.  Bone biopsy second metatarsal RT foot  Surgeon: Edrick Kins DPM  Anesthesia: 50-50 mixture of 2% lidocaine plain with 0.5% Marcaine plain totaling 20 mL infiltrated in the patient's RT lower extremity in a digital block fashion  Hemostasis: Ankle tourniquet inflated to a pressure of 285mHg after esmarch exsanguination   EBL: Minimal mL Materials: None Injectables: None Pathology: Digits 2, 3 RT foot sent for specimen.  Bone biopsy second metatarsal RT foot  Condition: The patient tolerated the procedure and anesthesia well. No complications noted or reported   Justification for procedure: The patient is a 50y.o. male PMHx diabetes mellitus who presents today for surgical correction of gangrenous toes of the second third digit of the right foot. All conservative modalities of been unsuccessful in providing any sort of satisfactory alleviation of symptoms with the patient. The patient was told benefits as well as possible side effects of the surgery. The patient consented for surgical correction. The patient consent form was reviewed. All patient questions were answered. No guarantees were expressed or implied. The patient and the surgeon both signed the patient consent form with the witness present and placed in the patient's chart.   Procedure in Detail: The patient was brought to the operating room, placed in the operating table in the supine position at which time an aseptic scrub and drape were performed about the patient's respective lower extremity after anesthesia was induced as described above. Attention was then directed to the surgical area where procedure number one commenced.  Procedure #1:  Second and third ray amputations right foot A racquet type incision was planned and made about the second and third rays of the right foot.  The incision was planned to ensure that the necrotic tissue around the second and third toes was completely excised.  Incision was carried down to the level of bone and joint at the MTPJ's where the second third digits were disarticulated and removed.  To allow for better wound healing and closure the decision was made to resect the distal third of the second and third metatarsals to allow for better closure and healing.  An osteotomy was created using a sagittal blade mounted on a sagittal saw in the distal third of the second and third metatarsal was removed in toto and placed in a sterile specimen container and sent for path specimen.  Procedure #2: Incision and drainage right foot After resection of the second and third partial rays, attention was directed to the soft tissue structures around the foot and amputation site.  There was proximal tracking and purulence noted along the plantar compartments of the foot extending proximally along the flexor tendons.  Heavy purulence noted.  A curved mosquito was utilized to open and explore the plantar compartments of the foot and allow for additional drainage.  Again, there was an extensive mount of purulence along the plantar compartments of the foot within the amputation site.  At this time the decision was made for copious irrigation of the amputation site.  Irrigation was performed using pulse lavage and 2 L of normal saline.  Procedure #3: Bone biopsy second metatarsal right foot Decision was made intraoperatively to harvest a small portion of the diaphysis  of the second metatarsal and sent it to pathology for bone biopsy both gross and microscopic exam to rule out osteomyelitis.  A small portion of the bone of the distal second metatarsal amputation site was harvested using a sagittal blade mounted on a sagittal saw.   The bone was placed in a sterile specimen container and sent to pathology for gross microscopic exam  Decision was made to leave the amputation site open to allow for drainage.  Saline wet-to-dry gauze dressing was used to dress the foot. Dry sterile compressive dressings were then applied to the surgical foot. The tourniquet which was used for hemostasis was deflated. All normal neurovascular responses including pink color and warmth returned all the remaining digits of patient's lower extremity.  The patient was then transferred from the operating room to the recovery room having tolerated the procedure and anesthesia well. All vital signs are stable. After a brief stay in the recovery room the patient was readmitted to inpatient room with adequate prescriptions for analgesia. Verbal as well as written instructions were provided for the patient regarding wound care.  Keep dressings clean dry and intact.  Planning to see the patient tomorrow for dressing change and reevaluation.  Patient may require an additional return to the OR for delayed primary closure and irrigation and debridement  Edrick Kins, DPM [facility]  Dr. Edrick Kins, DPM    [address], [place] [address]                Office [phone]  Fax [phone]

## 2020-11-05 NOTE — Progress Notes (Signed)
ID Saw patient at 1 pm when he was waiting for surgery Was upset that he was empty stomach since yesterday evening and surgery was only at 3 pm No diarrhea since he has been fasting  Awake and alert BP (!) 155/97 (BP Location: Left Arm)   Pulse 90   Temp 98.5 F (36.9 C) (Oral)   Resp 18   Ht '5\' 7"'$  (1.702 m)   Wt 93.9 kg   SpO2 95%   BMI 32.42 kg/m   Chest b/l air entry Hss1s2 Abd soft Rt foot- gangrene 2/3/toe   Labs CBC Latest Ref Rng & Units 11/05/2020 11/04/2020 11/02/2020  WBC 4.0 - 10.5 K/uL 11.7(H) 13.4(H) 18.4(H)  Hemoglobin 13.0 - 17.0 g/dL 11.7(L) 11.5(L) 12.1(L)  Hematocrit 39.0 - 52.0 % 35.7(L) 35.0(L) 35.8(L)  Platelets 150 - 400 K/uL 283 258 218     CMP Latest Ref Rng & Units 11/05/2020 11/04/2020 11/03/2020  Glucose 70 - 99 mg/dL 162(H) 182(H) -  BUN 6 - 20 mg/dL 13 18 -  Creatinine 0.61 - 1.24 mg/dL 0.58(L) 0.65 0.80  Sodium 135 - 145 mmol/L 135 136 -  Potassium 3.5 - 5.1 mmol/L 3.7 3.6 -  Chloride 98 - 111 mmol/L 101 101 -  CO2 22 - 32 mmol/L 27 28 -  Calcium 8.9 - 10.3 mg/dL 7.8(L) 7.9(L) -  Total Protein 6.5 - 8.1 g/dL - - -  Total Bilirubin 0.3 - 1.2 mg/dL - - -  Alkaline Phos 38 - 126 U/L - - -  AST 15 - 41 U/L - - -  ALT 0 - 44 U/L - - -    Micro Wound culture MSSA and GBS   Impression/recommendation Diabetic Foot infection  with necrosis . S/p I/D- has gangrene of 2nd and 3rd toe and is going back for amputation. MSSA and GBS in culture- so currently on cefazolin  Diarrhea- none today as he has not been eating anything today- so this was due to supplements- prosure and glucerna- watch for recurrence when he starts back on them  DM on insulin  Discussed with patient ID will follow him peripherally this weekend- call if needed

## 2020-11-05 NOTE — Plan of Care (Signed)
Patient sleeping between care. Oriented x4. Minimal pain.  No new changes in assessment. NPO since midnight. Call bell within reach.  PLAN OF CARE ONGOING Problem: Education: Goal: Knowledge of General Education information will improve Description: Including pain rating scale, medication(s)/side effects and non-pharmacologic comfort measures Outcome: Progressing   Problem: Health Behavior/Discharge Planning: Goal: Ability to manage health-related needs will improve Outcome: Progressing   Problem: Clinical Measurements: Goal: Ability to maintain clinical measurements within normal limits will improve Outcome: Progressing Goal: Will remain free from infection Outcome: Progressing Goal: Diagnostic test results will improve Outcome: Progressing Goal: Respiratory complications will improve Outcome: Progressing Goal: Cardiovascular complication will be avoided Outcome: Progressing   Problem: Activity: Goal: Risk for activity intolerance will decrease Outcome: Progressing   Problem: Nutrition: Goal: Adequate nutrition will be maintained Outcome: Progressing   Problem: Coping: Goal: Level of anxiety will decrease Outcome: Progressing   Problem: Elimination: Goal: Will not experience complications related to bowel motility Outcome: Progressing Goal: Will not experience complications related to urinary retention Outcome: Progressing   Problem: Pain Managment: Goal: General experience of comfort will improve Outcome: Progressing   Problem: Safety: Goal: Ability to remain free from injury will improve Outcome: Progressing   Problem: Skin Integrity: Goal: Risk for impaired skin integrity will decrease Outcome: Progressing

## 2020-11-05 NOTE — Progress Notes (Signed)
PROGRESS NOTE    LAMBROS SPACE  [id number] DOB: May 27, 1970 DOA: 11/01/2020 PCP: Patient, No Pcp Per (Inactive)  Assessment & Plan:   Principal Problem:   Necrotizing soft tissue infection Active Problems:   Puncture wound   Type 2 diabetes mellitus with peripheral neuropathy (HCC)   Diabetic foot infection ([place])  Necrotizing soft tissue infection of right foot: involving the right second toe, following a puncture wound. S/p I&D on 8/22. Wound cx growing stap aureus, strep agalactiae. Abxs changed to IV cefazolin as per ID. Will go for 2nd and 3rd toe amputations on 8/26 as per podiatry   DM2: HbA1c 11.8, poorly controlled. Noncompliant w/ prescribed meds, stopped taking metformin due to diarrhea. Continue on glargine, SSI w/ accuchecks. Will need insulin at d/c   Leukocytosis: secondary to above infection. Continue on IV abxs     DVT prophylaxis: lovenox Code Status: full  Family Communication:  Disposition Plan: unclear  Level of care: Med-Surg  Status is: Inpatient  Remains inpatient appropriate because:Ongoing diagnostic testing needed not appropriate for outpatient work up, Unsafe d/c plan, IV treatments appropriate due to intensity of illness or inability to take PO, and Inpatient level of care appropriate due to severity of illness going for surgery today   Dispo: The patient is from: Home              Anticipated d/c is to: Home              Patient currently is not medically stable to d/c.   Difficult to place patient : unclear   Consultants:  ID Podiatry   Procedures:   Antimicrobials: cefazolin    Subjective: Pt c/o malaise   Objective: Vitals:   11/04/20 1600 11/04/20 2019 11/05/20 0613 11/05/20 0636  BP: (!) 152/88 (!) 155/85 137/72   Pulse: 94 92 89   Resp: '18 16 19   '$ Temp: 98.1 F (36.7 C) 98.4 F (36.9 C) 99.2 F (37.3 C)   TempSrc:  Oral    SpO2: 95% 97% 97%   Weight:    93.9 kg  Height:        Intake/Output Summary (Last 24  hours) at 11/05/2020 0740 Last data filed at 11/05/2020 0300 Gross per 24 hour  Intake 1055 ml  Output --  Net 1055 ml   Filed Weights   11/01/20 1608 11/04/20 0500 11/05/20 0636  Weight: 85.7 kg 95.3 kg 93.9 kg    Examination:  General exam: Appears calm & comfortable  Respiratory system: Clear breath sounds b/l  Cardiovascular system: S1 & S2+. No rubs or clicks  Gastrointestinal system: Abd is soft, NT, ND & normal bowel sounds  Central nervous system: Alert and oriented. Moves all extremities  Extremities: right foot is dressed and dressing is C/D/I Psychiatry: Judgement and insight appear normal. Appropriate mood and affect     Data Reviewed: I have personally reviewed following labs and imaging studies  CBC: Recent Labs  Lab 11/01/20 0956 11/02/20 0342 11/04/20 0435 11/05/20 0500  WBC 18.2* 18.4* 13.4* 11.7*  NEUTROABS 13.9*  --   --   --   HGB 13.0 12.1* 11.5* 11.7*  HCT 37.2* 35.8* 35.0* 35.7*  MCV 92.5 95.0 96.2 92.7  PLT 202 218 258 [id number]   Basic Metabolic Panel: Recent Labs  Lab 11/01/20 0956 11/02/20 0342 11/03/20 0750 11/04/20 0435 11/05/20 0500  NA 134* 134*  --  136 135  K 3.9 4.1  --  3.6 3.7  CL 98 102  --  101 101  CO2 25 23  --  28 27  GLUCOSE 278* 254*  --  182* 162*  BUN 14 17  --  18 13  CREATININE 0.83 0.86 0.80 0.65 0.58*  CALCIUM 8.4* 8.1*  --  7.9* 7.8*   GFR: Estimated Creatinine Clearance: 117.9 mL/min (A) (by C-G formula based on SCr of 0.58 mg/dL (L)). Liver Function Tests: Recent Labs  Lab 11/01/20 0956  AST 11*  ALT 12  ALKPHOS 74  BILITOT 1.6*  PROT 6.2*  ALBUMIN 3.1*   No results for input(s): LIPASE, AMYLASE in the last 168 hours. No results for input(s): AMMONIA in the last 168 hours. Coagulation Profile: Recent Labs  Lab 11/01/20 0956  INR 1.1   Cardiac Enzymes: No results for input(s): CKTOTAL, CKMB, CKMBINDEX, TROPONINI in the last 168 hours. BNP (last 3 results) No results for input(s): PROBNP in the  last 8760 hours. HbA1C: No results for input(s): HGBA1C in the last 72 hours.  CBG: Recent Labs  Lab 11/04/20 1207 11/04/20 1605 11/04/20 2014 11/04/20 2340 11/05/20 0408  GLUCAP 240* 271* 234* 175* 181*   Lipid Profile: No results for input(s): CHOL, HDL, LDLCALC, TRIG, CHOLHDL, LDLDIRECT in the last 72 hours. Thyroid Function Tests: No results for input(s): TSH, T4TOTAL, FREET4, T3FREE, THYROIDAB in the last 72 hours. Anemia Panel: No results for input(s): VITAMINB12, FOLATE, FERRITIN, TIBC, IRON, RETICCTPCT in the last 72 hours. Sepsis Labs: Recent Labs  Lab 11/01/20 0956  LATICACIDVEN 1.3    Recent Results (from the past 240 hour(s))  Blood culture (single)     Status: None (Preliminary result)   Collection Time: 11/01/20  9:56 AM   Specimen: BLOOD  Result Value Ref Range Status   Specimen Description BLOOD LEFT FA  Final   Special Requests   Final    BOTTLES DRAWN AEROBIC AND ANAEROBIC Blood Culture adequate volume   Culture   Final    NO GROWTH 4 DAYS Performed at [facility], [address], [place], [place] [address]    Report Status PENDING  Incomplete  Aerobic Culture w Gram Stain (superficial specimen)     Status: None   Collection Time: 11/01/20 10:26 AM   Specimen: Foot  Result Value Ref Range Status   Specimen Description   Final    FOOT RIGHT Performed at [facility], [address], [place], [place] [address]    Special Requests   Final    NONE Performed at [facility], [address] [place], [place] [address]    Gram Stain   Final    NO ORGANISMS SEEN SQUAMOUS EPITHELIAL CELLS PRESENT RARE WBC PRESENT, PREDOMINANTLY MONONUCLEAR NO ORGANISMS SEEN BACTERIA    Culture   Final    RARE STAPHYLOCOCCUS AUREUS RARE STREPTOCOCCUS AGALACTIAE TESTING AGAINST S. AGALACTIAE NOT ROUTINELY PERFORMED DUE TO PREDICTABILITY OF AMP/PEN/VAN SUSCEPTIBILITY. Performed at [facility], [place] [address] [place], [place]  [address]    Report Status 11/04/2020 FINAL  Final   Organism ID, Bacteria STAPHYLOCOCCUS AUREUS  Final      Susceptibility   Staphylococcus aureus - MIC*    CIPROFLOXACIN <=0.5 SENSITIVE Sensitive     ERYTHROMYCIN <=0.25 SENSITIVE Sensitive     GENTAMICIN <=0.5 SENSITIVE Sensitive     OXACILLIN <=0.25 SENSITIVE Sensitive     TETRACYCLINE <=1 SENSITIVE Sensitive     VANCOMYCIN 1 SENSITIVE Sensitive     TRIMETH/SULFA <=10 SENSITIVE Sensitive     CLINDAMYCIN <=0.25 SENSITIVE Sensitive  RIFAMPIN <=0.5 SENSITIVE Sensitive     Inducible Clindamycin NEGATIVE Sensitive     * RARE STAPHYLOCOCCUS AUREUS  Resp Panel by RT-PCR (Flu A&B, Covid) Nasopharyngeal Swab     Status: None   Collection Time: 11/01/20 11:01 AM   Specimen: Nasopharyngeal Swab; Nasopharyngeal(NP) swabs in vial transport medium  Result Value Ref Range Status   SARS Coronavirus 2 by RT PCR NEGATIVE NEGATIVE Final    Comment: (NOTE) SARS-CoV-2 target nucleic acids are NOT DETECTED.  The SARS-CoV-2 RNA is generally detectable in upper respiratory specimens during the acute phase of infection. The lowest concentration of SARS-CoV-2 viral copies this assay can detect is 138 copies/mL. A negative result does not preclude SARS-Cov-2 infection and should not be used as the sole basis for treatment or other patient management decisions. A negative result may occur with  improper specimen collection/handling, submission of specimen other than nasopharyngeal swab, presence of viral mutation(s) within the areas targeted by this assay, and inadequate number of viral copies(<138 copies/mL). A negative result must be combined with clinical observations, patient history, and epidemiological information. The expected result is Negative.  Fact Sheet for Patients:  EntrepreneurPulse.com.au  Fact Sheet for Healthcare Providers:  IncredibleEmployment.be  This test is no t yet approved or cleared by  the [place] FDA and  has been authorized for detection and/or diagnosis of SARS-CoV-2 by FDA under an Emergency Use Authorization (EUA). This EUA will remain  in effect (meaning this test can be used) for the duration of the COVID-19 declaration under Section 564(b)(1) of the Act, 21 U.S.C.section 360bbb-3(b)(1), unless the authorization is terminated  or revoked sooner.       Influenza A by PCR NEGATIVE NEGATIVE Final   Influenza B by PCR NEGATIVE NEGATIVE Final    Comment: (NOTE) The Xpert Xpress SARS-CoV-2/FLU/RSV plus assay is intended as an aid in the diagnosis of influenza from Nasopharyngeal swab specimens and should not be used as a sole basis for treatment. Nasal washings and aspirates are unacceptable for Xpert Xpress SARS-CoV-2/FLU/RSV testing.  Fact Sheet for Patients: EntrepreneurPulse.com.au  Fact Sheet for Healthcare Providers: IncredibleEmployment.be  This test is not yet approved or cleared by the [place] FDA and has been authorized for detection and/or diagnosis of SARS-CoV-2 by FDA under an Emergency Use Authorization (EUA). This EUA will remain in effect (meaning this test can be used) for the duration of the COVID-19 declaration under Section 564(b)(1) of the Act, 21 U.S.C. section 360bbb-3(b)(1), unless the authorization is terminated or revoked.  Performed at [facility], [place], [place] [address]   Aerobic/Anaerobic Culture w Gram Stain (surgical/deep wound)     Status: None (Preliminary result)   Collection Time: 11/01/20  5:28 PM   Specimen: PATH Other; Tissue  Result Value Ref Range Status   Specimen Description WOUND RIGHT FOOT  Final   Special Requests 2ND TOE  Final   Gram Stain   Final    RARE WBC PRESENT, PREDOMINANTLY MONONUCLEAR ABUNDANT GRAM POSITIVE COCCI Performed at [facility], [place] [address] [place], [place] [address]    Culture   Final    MODERATE  STAPHYLOCOCCUS AUREUS ABUNDANT STREPTOCOCCUS AGALACTIAE TESTING AGAINST S. AGALACTIAE NOT ROUTINELY PERFORMED DUE TO PREDICTABILITY OF AMP/PEN/VAN SUSCEPTIBILITY. NO ANAEROBES ISOLATED; CULTURE IN PROGRESS FOR 5 DAYS    Report Status PENDING  Incomplete   Organism ID, Bacteria STAPHYLOCOCCUS AUREUS  Final      Susceptibility   Staphylococcus aureus - MIC*  CIPROFLOXACIN <=0.5 SENSITIVE Sensitive     ERYTHROMYCIN RESISTANT Resistant     GENTAMICIN <=0.5 SENSITIVE Sensitive     OXACILLIN 0.5 SENSITIVE Sensitive     TETRACYCLINE <=1 SENSITIVE Sensitive     VANCOMYCIN 1 SENSITIVE Sensitive     TRIMETH/SULFA <=10 SENSITIVE Sensitive     CLINDAMYCIN RESISTANT Resistant     RIFAMPIN <=0.5 SENSITIVE Sensitive     Inducible Clindamycin POSITIVE Resistant     * MODERATE STAPHYLOCOCCUS AUREUS         Radiology Studies: No results found.      Scheduled Meds:  (feeding supplement) PROSource Plus  30 mL Oral TID BM   calcium carbonate  400 mg of elemental calcium Oral BID   enoxaparin (LOVENOX) injection  40 mg Subcutaneous Q24H   feeding supplement (GLUCERNA SHAKE)  237 mL Oral TID BM   insulin aspart  0-15 Units Subcutaneous Q4H   insulin glargine-yfgn  5 Units Subcutaneous Daily   multivitamin with minerals  1 tablet Oral Daily   nutrition supplement (JUVEN)  1 packet Oral BID BM   Continuous Infusions:  sodium chloride 100 mL/hr at 11/04/20 2327    ceFAZolin (ANCEF) IV 2 g (11/05/20 0736)     LOS: 4 days    Time spent: 31 mins    Wyvonnia Dusky, MD [facility] Pager [phone]  If 7PM-7AM, please contact night-coverage 11/05/2020, 7:40 AM

## 2020-11-05 NOTE — Anesthesia Postprocedure Evaluation (Signed)
Anesthesia Post Note  Patient: Fernando Vega  Procedure(s) Performed: AMPUTATION RAY-Partial Ray Amp 2nd & 3rd Digit (Right: Foot)  Patient location during evaluation: PACU Anesthesia Type: General Level of consciousness: awake and alert Pain management: pain level controlled Vital Signs Assessment: post-procedure vital signs reviewed and stable Respiratory status: spontaneous breathing, nonlabored ventilation, respiratory function stable and patient connected to nasal cannula oxygen Cardiovascular status: blood pressure returned to baseline and stable Postop Assessment: no apparent nausea or vomiting Anesthetic complications: no   No notable events documented.   Last Vitals:  Vitals:   11/05/20 1730 11/05/20 2005  BP: (!) 155/97 137/70  Pulse: 90 94  Resp: 18 20  Temp: 36.9 C 36.7 C  SpO2: 95% 95%    Last Pain:  Vitals:   11/05/20 2003  TempSrc:   PainSc: 10-Worst pain ever                 [name]

## 2020-11-05 NOTE — Interval H&P Note (Signed)
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  The various methods of treatment have been discussed with the patient and family. After consideration of risks, benefits and other options for treatment, the patient has consented to  Procedure(s): AMPUTATION RAY-Partial Ray Amp 2nd & 3rd Digit (Right) as a surgical intervention.  The patient's history has been reviewed, patient examined, no change in status, stable for surgery.  I have reviewed the patient's chart and labs.  Questions were answered to the patient's satisfaction.     Edrick Kins

## 2020-11-05 NOTE — Transfer of Care (Signed)
Immediate Anesthesia Transfer of Care Note  Patient: DECKLEN ZANIN  Procedure(s) Performed: AMPUTATION RAY-Partial Ray Amp 2nd & 3rd Digit (Right: Foot)  Patient Location: PACU  Anesthesia Type:General  Level of Consciousness: drowsy  Airway & Oxygen Therapy: Patient Spontanous Breathing and Patient connected to face mask oxygen  Post-op Assessment: Report given to RN and Post -op Vital signs reviewed and stable  Post vital signs: Reviewed and stable  Last Vitals:  Vitals Value Taken Time  BP 113/65 11/05/20 1657  Temp    Pulse 83 11/05/20 1658  Resp 13   SpO2 95 % 11/05/20 1658  Vitals shown include unvalidated device data.  Last Pain:  Vitals:   11/05/20 1417  TempSrc: Temporal  PainSc: 10-Worst pain ever      Patients Stated Pain Goal: 0 ([id number])  Complications: No notable events documented.

## 2020-11-06 LAB — BASIC METABOLIC PANEL
Anion gap: 5 (ref 5–15)
BUN: 16 mg/dL (ref 6–20)
CO2: 31 mmol/L (ref 22–32)
Calcium: 8.3 mg/dL — ABNORMAL LOW (ref 8.9–10.3)
Chloride: 98 mmol/L (ref 98–111)
Creatinine, Ser: 0.72 mg/dL (ref 0.61–1.24)
GFR, Estimated: >60 mL/min (ref >60)
Glucose, Bld: 199 mg/dL — ABNORMAL HIGH (ref 70–99)
Potassium: 3.9 mmol/L (ref 3.5–5.1)
Sodium: 134 mmol/L — ABNORMAL LOW (ref 135–145)

## 2020-11-06 LAB — CBC
HCT: 35.5 % — ABNORMAL LOW (ref 39.0–52.0)
Hemoglobin: 11.5 g/dL — ABNORMAL LOW (ref 13.0–17.0)
MCH: 30.4 pg (ref 26.0–34.0)
MCHC: 32.4 g/dL (ref 30.0–36.0)
MCV: 93.9 fL (ref 80.0–100.0)
Platelets: 312 10*3/uL (ref 150–400)
RBC: 3.78 MIL/uL — ABNORMAL LOW (ref 4.22–5.81)
RDW: 11.8 % (ref 11.5–15.5)
WBC: 13.5 10*3/uL — ABNORMAL HIGH (ref 4.0–10.5)
nRBC: 0.3 % — ABNORMAL HIGH (ref 0.0–0.2)

## 2020-11-06 LAB — CULTURE, BLOOD (SINGLE)
Culture: NO GROWTH
Special Requests: ADEQUATE

## 2020-11-06 LAB — GLUCOSE, CAPILLARY
Glucose-Capillary: 208 mg/dL — ABNORMAL HIGH (ref 70–99)
Glucose-Capillary: 173 mg/dL — ABNORMAL HIGH (ref 70–99)
Glucose-Capillary: 223 mg/dL — ABNORMAL HIGH (ref 70–99)
Glucose-Capillary: 211 mg/dL — ABNORMAL HIGH (ref 70–99)
Glucose-Capillary: 220 mg/dL — ABNORMAL HIGH (ref 70–99)

## 2020-11-06 NOTE — Progress Notes (Signed)
   PODIATRY CONSULTATION  NAME Fernando Vega MRN [id number] DOB [birth year] DOA 11/01/2020   Subjective: patient status post 1 day postop partial ray amputation RT foot 11/05/2020. Patient states the pain is tolerable. Dressings have been clean and dry since surgery. Denies F/N/V/SOB/CP.   CBC Latest Ref Rng & Units 11/06/2020 11/05/2020 11/04/2020  WBC 4.0 - 10.5 K/uL 13.5(H) 11.7(H) 13.4(H)  Hemoglobin 13.0 - 17.0 g/dL 11.5(L) 11.7(L) 11.5(L)  Hematocrit 39.0 - 52.0 % 35.5(L) 35.7(L) 35.0(L)  Platelets 150 - 400 K/uL 312 283 258    BMP Latest Ref Rng & Units 11/06/2020 11/05/2020 11/04/2020  Glucose 70 - 99 mg/dL 199(H) 162(H) 182(H)  BUN 6 - 20 mg/dL '16 13 18  '$ Creatinine 0.61 - 1.24 mg/dL 0.72 0.58(L) 0.65  Sodium 135 - 145 mmol/L 134(L) 135 136  Potassium 3.5 - 5.1 mmol/L 3.9 3.7 3.6  Chloride 98 - 111 mmol/L 98 101 101  CO2 22 - 32 mmol/L '31 27 28  '$ Calcium 8.9 - 10.3 mg/dL 8.3(L) 7.8(L) 7.9(L)   Amputation site open. No purulence noted. Serosanginous drainage. No malodor. Improved erythema. No active bleeding.   ASSESSMENT/PLAN OF CARE S/p partial toe amputation second third digit right foot.  DOS: 11/05/2020 -Continued leukocytosis.  Continue abx as per hospitalist -Dressings changed today.  Will change daily -Patient may weight-bear as tolerated -Patient will likely need return to the OR for irrigation and debridement and possible placement of a wound VAC.  Tentatively planned for Monday or Tuesday -Will continue to follow    Thank you for the consult.  Please contact me directly with any questions or concerns.  Cell [phone]   Edrick Kins, DPM [facility]  Dr. Edrick Kins, DPM    [address], [place] [address]                Office [phone]  Fax [phone]

## 2020-11-06 NOTE — Progress Notes (Signed)
PROGRESS NOTE    Fernando Vega  [id number] DOB: 11-Apr-1970 DOA: 11/01/2020 PCP: Patient, No Pcp Per (Inactive)  Assessment & Plan:   Principal Problem:   Necrotizing soft tissue infection Active Problems:   Puncture wound   Type 2 diabetes mellitus with peripheral neuropathy (HCC)   Diabetic foot infection ([place])  Necrotizing soft tissue infection of right foot: involving the right second toe, following a puncture wound. S/p I&D on 8/22. Wound cx growing stap aureus, strep agalactiae. Continue on IV cefazolin as per ID. 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 With right foot pain today   DM2: HbA1c 11.8, poorly controlled. Noncompliant w/ prescribed meds, stopped taking metformin due to diarrhea. Continue on glargine, SSI w/ accuchecks. Will need insulin at d/c   Leukocytosis: secondary to above infection. Continue on IV abxs     DVT prophylaxis: lovenox Code Status: full  Family Communication:  Disposition Plan: unclear  Level of care: Med-Surg  Status is: Inpatient  Remains inpatient appropriate because:Ongoing diagnostic testing needed not appropriate for outpatient work up, Unsafe d/c plan, IV treatments appropriate due to intensity of illness or inability to take PO, and Inpatient level of care appropriate due to severity of illness going for surgery today   Dispo: The patient is from: Home              Anticipated d/c is to: Home              Patient currently is not medically stable to d/c.   Difficult to place patient : unclear   Consultants:  ID Podiatry   Procedures:   Antimicrobials: cefazolin    Subjective: Pt c/o right foot pain   Objective: Vitals:   11/05/20 2005 11/05/20 2336 11/06/20 0333 11/06/20 0729  BP: 137/70 129/69 129/76 131/67  Pulse: 94 99 99 93  Resp: '20 16 18 18  '$ Temp: 98 F (36.7 C) 99.2 F (37.3 C) 99.3 F (37.4 C)    TempSrc:      SpO2: 95% 96% 95% 96%  Weight:   95.4 kg   Height:        Intake/Output Summary (Last 24 hours) at 11/06/2020 0732 Last data filed at 11/06/2020 0335 Gross per 24 hour  Intake 312.5 ml  Output 850 ml  Net -537.5 ml   Filed Weights   11/05/20 0636 11/05/20 1417 11/06/20 0333  Weight: 93.9 kg 93.9 kg 95.4 kg    Examination:  General exam: Appears calm but uncomfortable  Respiratory system: Clear breath sounds b/l  Cardiovascular system: S1/S2+. No rubs or clicks  Gastrointestinal system: Abd is soft, NT, ND & hypoactive bowel sounds  Central nervous system: Alert and oriented. Moves all extremities  Extremities: right foot is dressed and dressing is C/D/I Psychiatry: Judgement and insight appear normal. Flat mood and affect    Data Reviewed: I have personally reviewed following labs and imaging studies  CBC: Recent Labs  Lab 11/01/20 0956 11/02/20 0342 11/04/20 0435 11/05/20 0500 11/06/20 0446  WBC 18.2* 18.4* 13.4* 11.7* 13.5*  NEUTROABS 13.9*  --   --   --   --   HGB 13.0 12.1* 11.5* 11.7* 11.5*  HCT 37.2* 35.8* 35.0* 35.7* 35.5*  MCV 92.5 95.0 96.2 92.7 93.9  PLT 202 218 258 283 [id number]   Basic Metabolic Panel: Recent Labs  Lab 11/01/20 0956 11/02/20 0342 11/03/20 0750 11/04/20  0435 11/05/20 0500 11/06/20 0446  NA 134* 134*  --  136 135 134*  K 3.9 4.1  --  3.6 3.7 3.9  CL 98 102  --  101 101 98  CO2 25 23  --  '28 27 31  '$ GLUCOSE 278* 254*  --  182* 162* 199*  BUN 14 17  --  '18 13 16  '$ CREATININE 0.83 0.86 0.80 0.65 0.58* 0.72  CALCIUM 8.4* 8.1*  --  7.9* 7.8* 8.3*   GFR: Estimated Creatinine Clearance: 118.9 mL/min (by C-G formula based on SCr of 0.72 mg/dL). Liver Function Tests: Recent Labs  Lab 11/01/20 0956  AST 11*  ALT 12  ALKPHOS 74  BILITOT 1.6*  PROT 6.2*  ALBUMIN 3.1*   No results for input(s): LIPASE, AMYLASE in the last 168 hours. No results for input(s): AMMONIA in the last 168 hours. Coagulation Profile: Recent  Labs  Lab 11/01/20 0956  INR 1.1   Cardiac Enzymes: No results for input(s): CKTOTAL, CKMB, CKMBINDEX, TROPONINI in the last 168 hours. BNP (last 3 results) No results for input(s): PROBNP in the last 8760 hours. HbA1C: No results for input(s): HGBA1C in the last 72 hours.  CBG: Recent Labs  Lab 11/05/20 1705 11/05/20 2008 11/05/20 2341 11/06/20 0336 11/06/20 0730  GLUCAP 125* 216* 242* 208* 173*   Lipid Profile: No results for input(s): CHOL, HDL, LDLCALC, TRIG, CHOLHDL, LDLDIRECT in the last 72 hours. Thyroid Function Tests: No results for input(s): TSH, T4TOTAL, FREET4, T3FREE, THYROIDAB in the last 72 hours. Anemia Panel: No results for input(s): VITAMINB12, FOLATE, FERRITIN, TIBC, IRON, RETICCTPCT in the last 72 hours. Sepsis Labs: Recent Labs  Lab 11/01/20 0956  LATICACIDVEN 1.3    Recent Results (from the past 240 hour(s))  Blood culture (single)     Status: None (Preliminary result)   Collection Time: 11/01/20  9:56 AM   Specimen: BLOOD  Result Value Ref Range Status   Specimen Description BLOOD LEFT FA  Final   Special Requests   Final    BOTTLES DRAWN AEROBIC AND ANAEROBIC Blood Culture adequate volume   Culture   Final    NO GROWTH 4 DAYS Performed at [facility], [address], [place], [place] [address]    Report Status PENDING  Incomplete  Aerobic Culture w Gram Stain (superficial specimen)     Status: None   Collection Time: 11/01/20 10:26 AM   Specimen: Foot  Result Value Ref Range Status   Specimen Description   Final    FOOT RIGHT Performed at [facility], [address] [place], [place] [address]    Special Requests   Final    NONE Performed at [facility], [address] [place], [place] [address]    Gram Stain   Final    NO ORGANISMS SEEN SQUAMOUS EPITHELIAL CELLS PRESENT RARE WBC PRESENT, PREDOMINANTLY MONONUCLEAR NO ORGANISMS SEEN BACTERIA    Culture   Final    RARE STAPHYLOCOCCUS AUREUS RARE  STREPTOCOCCUS AGALACTIAE TESTING AGAINST S. AGALACTIAE NOT ROUTINELY PERFORMED DUE TO PREDICTABILITY OF AMP/PEN/VAN SUSCEPTIBILITY. Performed at [facility], [place] [address], [place], [place] [address]    Report Status 11/04/2020 FINAL  Final   Organism ID, Bacteria STAPHYLOCOCCUS AUREUS  Final      Susceptibility   Staphylococcus aureus - MIC*    CIPROFLOXACIN <=0.5 SENSITIVE Sensitive     ERYTHROMYCIN <=0.25 SENSITIVE Sensitive     GENTAMICIN <=0.5 SENSITIVE Sensitive     OXACILLIN <=0.25  SENSITIVE Sensitive     TETRACYCLINE <=1 SENSITIVE Sensitive     VANCOMYCIN 1 SENSITIVE Sensitive     TRIMETH/SULFA <=10 SENSITIVE Sensitive     CLINDAMYCIN <=0.25 SENSITIVE Sensitive     RIFAMPIN <=0.5 SENSITIVE Sensitive     Inducible Clindamycin NEGATIVE Sensitive     * RARE STAPHYLOCOCCUS AUREUS  Resp Panel by RT-PCR (Flu A&B, Covid) Nasopharyngeal Swab     Status: None   Collection Time: 11/01/20 11:01 AM   Specimen: Nasopharyngeal Swab; Nasopharyngeal(NP) swabs in vial transport medium  Result Value Ref Range Status   SARS Coronavirus 2 by RT PCR NEGATIVE NEGATIVE Final    Comment: (NOTE) SARS-CoV-2 target nucleic acids are NOT DETECTED.  The SARS-CoV-2 RNA is generally detectable in upper respiratory specimens during the acute phase of infection. The lowest concentration of SARS-CoV-2 viral copies this assay can detect is 138 copies/mL. A negative result does not preclude SARS-Cov-2 infection and should not be used as the sole basis for treatment or other patient management decisions. A negative result may occur with  improper specimen collection/handling, submission of specimen other than nasopharyngeal swab, presence of viral mutation(s) within the areas targeted by this assay, and inadequate number of viral copies(<138 copies/mL). A negative result must be combined with clinical observations, patient history, and epidemiological information. The expected result is  Negative.  Fact Sheet for Patients:  EntrepreneurPulse.com.au  Fact Sheet for Healthcare Providers:  IncredibleEmployment.be  This test is no t yet approved or cleared by the [place] FDA and  has been authorized for detection and/or diagnosis of SARS-CoV-2 by FDA under an Emergency Use Authorization (EUA). This EUA will remain  in effect (meaning this test can be used) for the duration of the COVID-19 declaration under Section 564(b)(1) of the Act, 21 U.S.C.section 360bbb-3(b)(1), unless the authorization is terminated  or revoked sooner.       Influenza A by PCR NEGATIVE NEGATIVE Final   Influenza B by PCR NEGATIVE NEGATIVE Final    Comment: (NOTE) The Xpert Xpress SARS-CoV-2/FLU/RSV plus assay is intended as an aid in the diagnosis of influenza from Nasopharyngeal swab specimens and should not be used as a sole basis for treatment. Nasal washings and aspirates are unacceptable for Xpert Xpress SARS-CoV-2/FLU/RSV testing.  Fact Sheet for Patients: EntrepreneurPulse.com.au  Fact Sheet for Healthcare Providers: IncredibleEmployment.be  This test is not yet approved or cleared by the [place] FDA and has been authorized for detection and/or diagnosis of SARS-CoV-2 by FDA under an Emergency Use Authorization (EUA). This EUA will remain in effect (meaning this test can be used) for the duration of the COVID-19 declaration under Section 564(b)(1) of the Act, 21 U.S.C. section 360bbb-3(b)(1), unless the authorization is terminated or revoked.  Performed at [facility], [place], [place] [address]   Aerobic/Anaerobic Culture w Gram Stain (surgical/deep wound)     Status: None (Preliminary result)   Collection Time: 11/01/20  5:28 PM   Specimen: PATH Other; Tissue  Result Value Ref Range Status   Specimen Description WOUND RIGHT FOOT  Final   Special Requests 2ND TOE   Final   Gram Stain   Final    RARE WBC PRESENT, PREDOMINANTLY MONONUCLEAR ABUNDANT GRAM POSITIVE COCCI Performed at [facility], [place] [address], [place], [place] [address]    Culture   Final    MODERATE STAPHYLOCOCCUS AUREUS ABUNDANT STREPTOCOCCUS AGALACTIAE TESTING AGAINST S. AGALACTIAE NOT ROUTINELY PERFORMED DUE TO PREDICTABILITY OF AMP/PEN/VAN SUSCEPTIBILITY. NO ANAEROBES ISOLATED;  CULTURE IN PROGRESS FOR 5 DAYS    Report Status PENDING  Incomplete   Organism ID, Bacteria STAPHYLOCOCCUS AUREUS  Final      Susceptibility   Staphylococcus aureus - MIC*    CIPROFLOXACIN <=0.5 SENSITIVE Sensitive     ERYTHROMYCIN RESISTANT Resistant     GENTAMICIN <=0.5 SENSITIVE Sensitive     OXACILLIN 0.5 SENSITIVE Sensitive     TETRACYCLINE <=1 SENSITIVE Sensitive     VANCOMYCIN 1 SENSITIVE Sensitive     TRIMETH/SULFA <=10 SENSITIVE Sensitive     CLINDAMYCIN RESISTANT Resistant     RIFAMPIN <=0.5 SENSITIVE Sensitive     Inducible Clindamycin POSITIVE Resistant     * MODERATE STAPHYLOCOCCUS AUREUS         Radiology Studies: DG Foot Complete Right  Result Date: 11/05/2020 CLINICAL DATA:  Status post amputation of the second and third digits. EXAM: RIGHT FOOT COMPLETE - 3+ VIEW COMPARISON:  Right foot radiograph dated 11/01/2020. FINDINGS: Interval amputation of the second and third rays distal to the distal metatarsals. The amputated edges are sharp. There is no acute fracture or dislocation. The bones are well mineralized. No arthritic changes. Packing material noted adjacent to the amputation. IMPRESSION: Interval amputation of the second and third rays at the distal metatarsals. Electronically Signed   By: Anner Crete M.D.   On: 11/05/2020 19:25   DG MINI C-ARM IMAGE ONLY  Result Date: 11/05/2020 There is no interpretation for this exam.  This order is for images obtained during a surgical procedure.  Please See "Surgeries" Tab for more information regarding the procedure.         Scheduled Meds:  (feeding supplement) PROSource Plus  30 mL Oral TID BM   calcium carbonate  400 mg of elemental calcium Oral BID   enoxaparin (LOVENOX) injection  40 mg Subcutaneous Q24H   feeding supplement (GLUCERNA SHAKE)  237 mL Oral TID BM   insulin aspart  0-15 Units Subcutaneous Q4H   insulin glargine-yfgn  5 Units Subcutaneous Daily   multivitamin with minerals  1 tablet Oral Daily   nutrition supplement (JUVEN)  1 packet Oral BID BM   Continuous Infusions:  sodium chloride 100 mL/hr at 11/05/20 2202    ceFAZolin (ANCEF) IV 2 g (11/06/20 0552)     LOS: 5 days    Time spent: 33 mins    Wyvonnia Dusky, MD [facility] Pager [phone]  If 7PM-7AM, please contact night-coverage 11/06/2020, 7:32 AM

## 2020-11-07 LAB — BASIC METABOLIC PANEL
Anion gap: 7 (ref 5–15)
BUN: 14 mg/dL (ref 6–20)
CO2: 32 mmol/L (ref 22–32)
Calcium: 8.2 mg/dL — ABNORMAL LOW (ref 8.9–10.3)
Chloride: 98 mmol/L (ref 98–111)
Creatinine, Ser: 0.72 mg/dL (ref 0.61–1.24)
GFR, Estimated: >60 mL/min (ref >60)
Glucose, Bld: 163 mg/dL — ABNORMAL HIGH (ref 70–99)
Potassium: 3.7 mmol/L (ref 3.5–5.1)
Sodium: 137 mmol/L (ref 135–145)

## 2020-11-07 LAB — AEROBIC/ANAEROBIC CULTURE W GRAM STAIN (SURGICAL/DEEP WOUND)

## 2020-11-07 LAB — GLUCOSE, CAPILLARY
Glucose-Capillary: 152 mg/dL — ABNORMAL HIGH (ref 70–99)
Glucose-Capillary: 156 mg/dL — ABNORMAL HIGH (ref 70–99)
Glucose-Capillary: 180 mg/dL — ABNORMAL HIGH (ref 70–99)
Glucose-Capillary: 215 mg/dL — ABNORMAL HIGH (ref 70–99)
Glucose-Capillary: 230 mg/dL — ABNORMAL HIGH (ref 70–99)
Glucose-Capillary: 248 mg/dL — ABNORMAL HIGH (ref 70–99)

## 2020-11-07 LAB — CBC
HCT: 33.5 % — ABNORMAL LOW (ref 39.0–52.0)
Hemoglobin: 10.9 g/dL — ABNORMAL LOW (ref 13.0–17.0)
MCH: 31.1 pg (ref 26.0–34.0)
MCHC: 32.5 g/dL (ref 30.0–36.0)
MCV: 95.7 fL (ref 80.0–100.0)
Platelets: 315 10*3/uL (ref 150–400)
RBC: 3.5 MIL/uL — ABNORMAL LOW (ref 4.22–5.81)
RDW: 12.1 % (ref 11.5–15.5)
WBC: 16.8 10*3/uL — ABNORMAL HIGH (ref 4.0–10.5)
nRBC: 0.1 % (ref 0.0–0.2)

## 2020-11-07 NOTE — H&P (View-Only) (Signed)
   PODIATRY PROGRESS NOTE  NAME Fernando Vega MRN [id number] DOB Jun 14, 1970 DOA 11/01/2020   Subjective: Patient status 2-day postop partial ray amputation, second and third, RT foot 11/05/2020.  Patient states that he is doing well.  Denies N/V/SOB/CP.   Past Medical History:  Diagnosis Date   Diabetes mellitus without complication ([place])    Neuropathy    Sarcoidosis    Sleep apnea     CBC Latest Ref Rng & Units 11/07/2020 11/06/2020 11/05/2020  WBC 4.0 - 10.5 K/uL 16.8(H) 13.5(H) 11.7(H)  Hemoglobin 13.0 - 17.0 g/dL 10.9(L) 11.5(L) 11.7(L)  Hematocrit 39.0 - 52.0 % 33.5(L) 35.5(L) 35.7(L)  Platelets 150 - 400 K/uL 315 312 283    BMP Latest Ref Rng & Units 11/07/2020 11/06/2020 11/05/2020  Glucose 70 - 99 mg/dL 163(H) 199(H) 162(H)  BUN 6 - 20 mg/dL '14 16 13  '$ Creatinine 0.61 - 1.24 mg/dL 0.72 0.72 0.58(L)  Sodium 135 - 145 mmol/L 137 134(L) 135  Potassium 3.5 - 5.1 mmol/L 3.7 3.9 3.7  Chloride 98 - 111 mmol/L 98 98 101  CO2 22 - 32 mmol/L 32 31 27  Calcium 8.9 - 10.3 mg/dL 8.2(L) 8.3(L) 7.8(L)    ASSESSMENT/PLAN OF CARE S/P partial ray amputation RT foot, 2nd 3rd. DOS: 11/05/2020 -Dressings changed today.  Saline wet-to-dry.  Reinforce as needed. -Planning for return to the OR for irrigation and debridement and placement of a wound VAC Tuesday evening after 5 PM -Continue antibiotics IV Ancef 2 g Q8H as per ID -Will follow    Please contact me directly with any questions or concerns.  Cell [phone]   Edrick Kins, DPM [facility]  Dr. Edrick Kins, DPM    [address],  [address]                Office [phone]  Fax [phone]

## 2020-11-07 NOTE — Progress Notes (Signed)
PROGRESS NOTE    Fernando Vega  [id number] DOB: [birth year] DOA: 11/01/2020 PCP: Patient, No Pcp Per (Inactive)  Assessment & Plan:   Principal Problem:   Necrotizing soft tissue infection Active Problems:   Puncture wound   Type 2 diabetes mellitus with peripheral neuropathy (HCC)   Diabetic foot infection ([place])  Necrotizing soft tissue infection of right foot: involving the right second toe, following a puncture wound. S/p I&D on 8/22. Wound cx growing stap aureus, strep agalactiae. Continue on IV cefazolin as per ID. 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 Will likely go back to the OR for irrigation, debridement & possible placement of a wound VAC tomorrow or 8/30 as per podiatry   DM2: poorly controlled, HbA1c 11.8. Noncompliant w/ prescribed meds. Will need insulin at d/c. Continue on glargine, SSI w/ accuchecks.    Leukocytosis: secondary to above infection. Continue on IV abxs     DVT prophylaxis: lovenox Code Status: full  Family Communication:  Disposition Plan: unclear  Level of care: Med-Surg  Status is: Inpatient  Remains inpatient appropriate because:Ongoing diagnostic testing needed not appropriate for outpatient work up, Unsafe d/c plan, IV treatments appropriate due to intensity of illness or inability to take PO, and Inpatient level of care appropriate due to severity of illness will likely go back to the OR tomorrow or 8/30   Dispo: The patient is from: Home              Anticipated d/c is to: Home              Patient currently is not medically stable to d/c.   Difficult to place patient : unclear   Consultants:  ID Podiatry   Procedures:   Antimicrobials: cefazolin    Subjective: Pt still c/o right foot pain but improved from day prior   Objective: Vitals:   11/06/20 1605 11/06/20 2124 11/07/20 0525 11/07/20 0559  BP: 131/69 124/71 127/69    Pulse: 96 89 84   Resp: '19 18 17   '$ Temp: 98.9 F (37.2 C) 98.7 F (37.1 C) 98.6 F (37 C)   TempSrc:      SpO2: 97% 96% 95%   Weight:    98.5 kg  Height:        Intake/Output Summary (Last 24 hours) at 11/07/2020 0731 Last data filed at 11/07/2020 0514 Gross per 24 hour  Intake 360 ml  Output 900 ml  Net -540 ml   Filed Weights   11/05/20 1417 11/06/20 0333 11/07/20 0559  Weight: 93.9 kg 95.4 kg 98.5 kg    Examination:  General exam: Appears comfortable  Respiratory system: Clear breath sounds b/l  Cardiovascular system: S1 & S2+. No rubs or clicks  Gastrointestinal system: Abd is soft, NT, ND & normal bowel sounds  Central nervous system: Alert and oriented. Moves all extremities Extremities: right foot is dressed and dressing is C/D/I  Psychiatry: Judgement and insight appear normal. Flat mood and affect    Data Reviewed: I have personally reviewed following labs and imaging studies  CBC: Recent Labs  Lab 11/01/20 0956 11/02/20 0342 11/04/20 0435 11/05/20 0500 11/06/20 0446 11/07/20 0443  WBC 18.2* 18.4* 13.4* 11.7* 13.5* 16.8*  NEUTROABS 13.9*  --   --   --   --   --   HGB 13.0 12.1* 11.5* 11.7* 11.5* 10.9*  HCT 37.2* 35.8* 35.0*  35.7* 35.5* 33.5*  MCV 92.5 95.0 96.2 92.7 93.9 95.7  PLT 202 218 258 283 [id number]   Basic Metabolic Panel: Recent Labs  Lab 11/02/20 0342 11/03/20 0750 11/04/20 0435 11/05/20 0500 11/06/20 0446 11/07/20 0443  NA 134*  --  136 135 134* 137  K 4.1  --  3.6 3.7 3.9 3.7  CL 102  --  101 101 98 98  CO2 23  --  '28 27 31 '$ 32  GLUCOSE 254*  --  182* 162* 199* 163*  BUN 17  --  '18 13 16 14  '$ CREATININE 0.86 0.80 0.65 0.58* 0.72 0.72  CALCIUM 8.1*  --  7.9* 7.8* 8.3* 8.2*   GFR: Estimated Creatinine Clearance: 120.8 mL/min (by C-G formula based on SCr of 0.72 mg/dL). Liver Function Tests: Recent Labs  Lab 11/01/20 0956  AST 11*  ALT 12  ALKPHOS 74  BILITOT 1.6*  PROT 6.2*  ALBUMIN 3.1*   No results for input(s):  LIPASE, AMYLASE in the last 168 hours. No results for input(s): AMMONIA in the last 168 hours. Coagulation Profile: Recent Labs  Lab 11/01/20 0956  INR 1.1   Cardiac Enzymes: No results for input(s): CKTOTAL, CKMB, CKMBINDEX, TROPONINI in the last 168 hours. BNP (last 3 results) No results for input(s): PROBNP in the last 8760 hours. HbA1C: No results for input(s): HGBA1C in the last 72 hours.  CBG: Recent Labs  Lab 11/06/20 1144 11/06/20 1607 11/06/20 2051 11/07/20 0019 11/07/20 0453  GLUCAP 223* 211* 220* 215* 156*   Lipid Profile: No results for input(s): CHOL, HDL, LDLCALC, TRIG, CHOLHDL, LDLDIRECT in the last 72 hours. Thyroid Function Tests: No results for input(s): TSH, T4TOTAL, FREET4, T3FREE, THYROIDAB in the last 72 hours. Anemia Panel: No results for input(s): VITAMINB12, FOLATE, FERRITIN, TIBC, IRON, RETICCTPCT in the last 72 hours. Sepsis Labs: Recent Labs  Lab 11/01/20 0956  LATICACIDVEN 1.3    Recent Results (from the past 240 hour(s))  Blood culture (single)     Status: None   Collection Time: 11/01/20  9:56 AM   Specimen: BLOOD  Result Value Ref Range Status   Specimen Description BLOOD LEFT FA  Final   Special Requests   Final    BOTTLES DRAWN AEROBIC AND ANAEROBIC Blood Culture adequate volume   Culture   Final    NO GROWTH 5 DAYS Performed at [facility], [address], [place], [place] [address]    Report Status 11/06/2020 FINAL  Final  Aerobic Culture w Gram Stain (superficial specimen)     Status: None   Collection Time: 11/01/20 10:26 AM   Specimen: Foot  Result Value Ref Range Status   Specimen Description   Final    FOOT RIGHT Performed at [facility], [address], [place], [place] [address]    Special Requests   Final    NONE Performed at [facility], [address] [place], [place] [address]    Gram Stain   Final    NO ORGANISMS SEEN SQUAMOUS EPITHELIAL CELLS PRESENT RARE WBC PRESENT,  PREDOMINANTLY MONONUCLEAR NO ORGANISMS SEEN BACTERIA    Culture   Final    RARE STAPHYLOCOCCUS AUREUS RARE STREPTOCOCCUS AGALACTIAE TESTING AGAINST S. AGALACTIAE NOT ROUTINELY PERFORMED DUE TO PREDICTABILITY OF AMP/PEN/VAN SUSCEPTIBILITY. Performed at [facility], [place] [address] [place], [place] [address]    Report Status 11/04/2020 FINAL  Final   Organism ID, Bacteria STAPHYLOCOCCUS AUREUS  Final      Susceptibility  Staphylococcus aureus - MIC*    CIPROFLOXACIN <=0.5 SENSITIVE Sensitive     ERYTHROMYCIN <=0.25 SENSITIVE Sensitive     GENTAMICIN <=0.5 SENSITIVE Sensitive     OXACILLIN <=0.25 SENSITIVE Sensitive     TETRACYCLINE <=1 SENSITIVE Sensitive     VANCOMYCIN 1 SENSITIVE Sensitive     TRIMETH/SULFA <=10 SENSITIVE Sensitive     CLINDAMYCIN <=0.25 SENSITIVE Sensitive     RIFAMPIN <=0.5 SENSITIVE Sensitive     Inducible Clindamycin NEGATIVE Sensitive     * RARE STAPHYLOCOCCUS AUREUS  Resp Panel by RT-PCR (Flu A&B, Covid) Nasopharyngeal Swab     Status: None   Collection Time: 11/01/20 11:01 AM   Specimen: Nasopharyngeal Swab; Nasopharyngeal(NP) swabs in vial transport medium  Result Value Ref Range Status   SARS Coronavirus 2 by RT PCR NEGATIVE NEGATIVE Final    Comment: (NOTE) SARS-CoV-2 target nucleic acids are NOT DETECTED.  The SARS-CoV-2 RNA is generally detectable in upper respiratory specimens during the acute phase of infection. The lowest concentration of SARS-CoV-2 viral copies this assay can detect is 138 copies/mL. A negative result does not preclude SARS-Cov-2 infection and should not be used as the sole basis for treatment or other patient management decisions. A negative result may occur with  improper specimen collection/handling, submission of specimen other than nasopharyngeal swab, presence of viral mutation(s) within the areas targeted by this assay, and inadequate number of viral copies(<138 copies/mL). A negative result must be combined  with clinical observations, patient history, and epidemiological information. The expected result is Negative.  Fact Sheet for Patients:  EntrepreneurPulse.com.au  Fact Sheet for Healthcare Providers:  IncredibleEmployment.be  This test is no t yet approved or cleared by the [place] FDA and  has been authorized for detection and/or diagnosis of SARS-CoV-2 by FDA under an Emergency Use Authorization (EUA). This EUA will remain  in effect (meaning this test can be used) for the duration of the COVID-19 declaration under Section 564(b)(1) of the Act, 21 U.S.C.section 360bbb-3(b)(1), unless the authorization is terminated  or revoked sooner.       Influenza A by PCR NEGATIVE NEGATIVE Final   Influenza B by PCR NEGATIVE NEGATIVE Final    Comment: (NOTE) The Xpert Xpress SARS-CoV-2/FLU/RSV plus assay is intended as an aid in the diagnosis of influenza from Nasopharyngeal swab specimens and should not be used as a sole basis for treatment. Nasal washings and aspirates are unacceptable for Xpert Xpress SARS-CoV-2/FLU/RSV testing.  Fact Sheet for Patients: EntrepreneurPulse.com.au  Fact Sheet for Healthcare Providers: IncredibleEmployment.be  This test is not yet approved or cleared by the [place] FDA and has been authorized for detection and/or diagnosis of SARS-CoV-2 by FDA under an Emergency Use Authorization (EUA). This EUA will remain in effect (meaning this test can be used) for the duration of the COVID-19 declaration under Section 564(b)(1) of the Act, 21 U.S.C. section 360bbb-3(b)(1), unless the authorization is terminated or revoked.  Performed at [facility], [place], [place] [address]   Aerobic/Anaerobic Culture w Gram Stain (surgical/deep wound)     Status: None (Preliminary result)   Collection Time: 11/01/20  5:28 PM   Specimen: PATH Other; Tissue  Result  Value Ref Range Status   Specimen Description WOUND RIGHT FOOT  Final   Special Requests 2ND TOE  Final   Gram Stain   Final    RARE WBC PRESENT, PREDOMINANTLY MONONUCLEAR ABUNDANT GRAM POSITIVE COCCI Performed at [facility], [place] [address] [place], [place]  [address]    Culture   Final    MODERATE STAPHYLOCOCCUS AUREUS ABUNDANT STREPTOCOCCUS AGALACTIAE TESTING AGAINST S. AGALACTIAE NOT ROUTINELY PERFORMED DUE TO PREDICTABILITY OF AMP/PEN/VAN SUSCEPTIBILITY. NO ANAEROBES ISOLATED; CULTURE IN PROGRESS FOR 5 DAYS    Report Status PENDING  Incomplete   Organism ID, Bacteria STAPHYLOCOCCUS AUREUS  Final      Susceptibility   Staphylococcus aureus - MIC*    CIPROFLOXACIN <=0.5 SENSITIVE Sensitive     ERYTHROMYCIN RESISTANT Resistant     GENTAMICIN <=0.5 SENSITIVE Sensitive     OXACILLIN 0.5 SENSITIVE Sensitive     TETRACYCLINE <=1 SENSITIVE Sensitive     VANCOMYCIN 1 SENSITIVE Sensitive     TRIMETH/SULFA <=10 SENSITIVE Sensitive     CLINDAMYCIN RESISTANT Resistant     RIFAMPIN <=0.5 SENSITIVE Sensitive     Inducible Clindamycin POSITIVE Resistant     * MODERATE STAPHYLOCOCCUS AUREUS         Radiology Studies: DG Foot Complete Right  Result Date: 11/05/2020 CLINICAL DATA:  Status post amputation of the second and third digits. EXAM: RIGHT FOOT COMPLETE - 3+ VIEW COMPARISON:  Right foot radiograph dated 11/01/2020. FINDINGS: Interval amputation of the second and third rays distal to the distal metatarsals. The amputated edges are sharp. There is no acute fracture or dislocation. The bones are well mineralized. No arthritic changes. Packing material noted adjacent to the amputation. IMPRESSION: Interval amputation of the second and third rays at the distal metatarsals. Electronically Signed   By: Anner Crete M.D.   On: 11/05/2020 19:25   DG MINI C-ARM IMAGE ONLY  Result Date: 11/05/2020 There is no interpretation for this exam.  This order is for images obtained during  a surgical procedure.  Please See "Surgeries" Tab for more information regarding the procedure.        Scheduled Meds:  (feeding supplement) PROSource Plus  30 mL Oral TID BM   enoxaparin (LOVENOX) injection  40 mg Subcutaneous Q24H   feeding supplement (GLUCERNA SHAKE)  237 mL Oral TID BM   insulin aspart  0-15 Units Subcutaneous Q4H   insulin glargine-yfgn  5 Units Subcutaneous Daily   multivitamin with minerals  1 tablet Oral Daily   nutrition supplement (JUVEN)  1 packet Oral BID BM   Continuous Infusions:  sodium chloride 100 mL/hr at 11/07/20 0457    ceFAZolin (ANCEF) IV 2 g (11/07/20 0501)     LOS: 6 days    Time spent: 30 mins    Wyvonnia Dusky, MD [facility] Pager [phone]  If 7PM-7AM, please contact night-coverage 11/07/2020, 7:31 AM

## 2020-11-07 NOTE — Progress Notes (Signed)
   PODIATRY PROGRESS NOTE  NAME KMARION WHITEHOUSE MRN [id number] DOB [birth year] DOA 11/01/2020   Subjective: Patient status 2-day postop partial ray amputation, second and third, RT foot 11/05/2020.  Patient states that he is doing well.  Denies N/V/SOB/CP.   Past Medical History:  Diagnosis Date   Diabetes mellitus without complication ([place])    Neuropathy    Sarcoidosis    Sleep apnea     CBC Latest Ref Rng & Units 11/07/2020 11/06/2020 11/05/2020  WBC 4.0 - 10.5 K/uL 16.8(H) 13.5(H) 11.7(H)  Hemoglobin 13.0 - 17.0 g/dL 10.9(L) 11.5(L) 11.7(L)  Hematocrit 39.0 - 52.0 % 33.5(L) 35.5(L) 35.7(L)  Platelets 150 - 400 K/uL 315 312 283    BMP Latest Ref Rng & Units 11/07/2020 11/06/2020 11/05/2020  Glucose 70 - 99 mg/dL 163(H) 199(H) 162(H)  BUN 6 - 20 mg/dL '14 16 13  '$ Creatinine 0.61 - 1.24 mg/dL 0.72 0.72 0.58(L)  Sodium 135 - 145 mmol/L 137 134(L) 135  Potassium 3.5 - 5.1 mmol/L 3.7 3.9 3.7  Chloride 98 - 111 mmol/L 98 98 101  CO2 22 - 32 mmol/L 32 31 27  Calcium 8.9 - 10.3 mg/dL 8.2(L) 8.3(L) 7.8(L)    ASSESSMENT/PLAN OF CARE S/P partial ray amputation RT foot, 2nd 3rd. DOS: 11/05/2020 -Dressings changed today.  Saline wet-to-dry.  Reinforce as needed. -Planning for return to the OR for irrigation and debridement and placement of a wound VAC Tuesday evening after 5 PM -Continue antibiotics IV Ancef 2 g Q8H as per ID -Will follow    Please contact me directly with any questions or concerns.  Cell [phone]   Edrick Kins, DPM [facility]  Dr. Edrick Kins, DPM    [address], [place] [address]                Office [phone]  Fax [phone]

## 2020-11-07 NOTE — Plan of Care (Signed)
No acute events during the night. VSS. Dressing to right foot dry and intact.  Problem: Education: Goal: Knowledge of General Education information will improve Description: Including pain rating scale, medication(s)/side effects and non-pharmacologic comfort measures Outcome: Progressing   Problem: Health Behavior/Discharge Planning: Goal: Ability to manage health-related needs will improve Outcome: Progressing   Problem: Clinical Measurements: Goal: Ability to maintain clinical measurements within normal limits will improve Outcome: Progressing Goal: Will remain free from infection Outcome: Progressing Goal: Diagnostic test results will improve Outcome: Progressing Goal: Respiratory complications will improve Outcome: Progressing Goal: Cardiovascular complication will be avoided Outcome: Progressing   Problem: Activity: Goal: Risk for activity intolerance will decrease Outcome: Progressing   Problem: Nutrition: Goal: Adequate nutrition will be maintained Outcome: Progressing   Problem: Coping: Goal: Level of anxiety will decrease Outcome: Progressing   Problem: Elimination: Goal: Will not experience complications related to bowel motility Outcome: Progressing Goal: Will not experience complications related to urinary retention Outcome: Progressing   Problem: Pain Managment: Goal: General experience of comfort will improve Outcome: Progressing   Problem: Safety: Goal: Ability to remain free from injury will improve Outcome: Progressing   Problem: Skin Integrity: Goal: Risk for impaired skin integrity will decrease Outcome: Progressing

## 2020-11-08 ENCOUNTER — Encounter: Payer: Self-pay | Admitting: Podiatry

## 2020-11-08 LAB — CBC
HCT: 29.3 % — ABNORMAL LOW (ref 39.0–52.0)
Hemoglobin: 9.4 g/dL — ABNORMAL LOW (ref 13.0–17.0)
MCH: 31 pg (ref 26.0–34.0)
MCHC: 32.1 g/dL (ref 30.0–36.0)
MCV: 96.7 fL (ref 80.0–100.0)
Platelets: 340 10*3/uL (ref 150–400)
RBC: 3.03 MIL/uL — ABNORMAL LOW (ref 4.22–5.81)
RDW: 11.9 % (ref 11.5–15.5)
WBC: 14.5 10*3/uL — ABNORMAL HIGH (ref 4.0–10.5)
nRBC: 0.1 % (ref 0.0–0.2)

## 2020-11-08 LAB — GLUCOSE, CAPILLARY
Glucose-Capillary: 136 mg/dL — ABNORMAL HIGH (ref 70–99)
Glucose-Capillary: 185 mg/dL — ABNORMAL HIGH (ref 70–99)
Glucose-Capillary: 190 mg/dL — ABNORMAL HIGH (ref 70–99)
Glucose-Capillary: 211 mg/dL — ABNORMAL HIGH (ref 70–99)
Glucose-Capillary: 219 mg/dL — ABNORMAL HIGH (ref 70–99)
Glucose-Capillary: 289 mg/dL — ABNORMAL HIGH (ref 70–99)

## 2020-11-08 LAB — BASIC METABOLIC PANEL
Anion gap: 5 (ref 5–15)
BUN: 17 mg/dL (ref 6–20)
CO2: 30 mmol/L (ref 22–32)
Calcium: 7.9 mg/dL — ABNORMAL LOW (ref 8.9–10.3)
Chloride: 100 mmol/L (ref 98–111)
Creatinine, Ser: 0.65 mg/dL (ref 0.61–1.24)
GFR, Estimated: >60 mL/min (ref >60)
Glucose, Bld: 187 mg/dL — ABNORMAL HIGH (ref 70–99)
Potassium: 3.5 mmol/L (ref 3.5–5.1)
Sodium: 135 mmol/L (ref 135–145)

## 2020-11-08 NOTE — Progress Notes (Signed)
   PODIATRY PROGRESS NOTE  NAME Fernando Vega MRN [id number] DOB Dec 14, 1970 DOA 11/01/2020   Subjective: Patient status 3-day postop partial ray amputation, second and third, RT foot 11/05/2020.  Patient states that he is doing well.  Denies N/V/SOB/CP.   Past Medical History:  Diagnosis Date   Diabetes mellitus without complication ([place])    Neuropathy    Sarcoidosis    Sleep apnea     CBC Latest Ref Rng & Units 11/08/2020 11/07/2020 11/06/2020  WBC 4.0 - 10.5 K/uL 14.5(H) 16.8(H) 13.5(H)  Hemoglobin 13.0 - 17.0 g/dL 9.4(L) 10.9(L) 11.5(L)  Hematocrit 39.0 - 52.0 % 29.3(L) 33.5(L) 35.5(L)  Platelets 150 - 400 K/uL 340 315 312    BMP Latest Ref Rng & Units 11/08/2020 11/07/2020 11/06/2020  Glucose 70 - 99 mg/dL 187(H) 163(H) 199(H)  BUN 6 - 20 mg/dL '17 14 16  '$ Creatinine 0.61 - 1.24 mg/dL 0.65 0.72 0.72  Sodium 135 - 145 mmol/L 135 137 134(L)  Potassium 3.5 - 5.1 mmol/L 3.5 3.7 3.9  Chloride 98 - 111 mmol/L 100 98 98  CO2 22 - 32 mmol/L 30 32 31  Calcium 8.9 - 10.3 mg/dL 7.9(L) 8.2(L) 8.3(L)    ASSESSMENT/PLAN OF CARE S/P partial ray amputation RT foot, 2nd 3rd. DOS: 11/05/2020 -Dressings changed today.  Saline wet-to-dry.  Reinforce as needed. Compression dressing was applied due to active bleeding. Surgicel was used.  -Planning for return to the OR for irrigation and debridement and placement of a wound VAC tomorrow evening after 5 PM with Dr. Amalia Hailey -Continue antibiotics IV Ancef 2 g Q8H as per ID -NPO after midnight -Will follow

## 2020-11-08 NOTE — Progress Notes (Signed)
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 S/p I&D on 8/22. Wound cx growing stap aureus, strep agalactiae. Continue on IV cefazolin as per ID. 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 Will go back to the OR for irrigation, debridement & possible placement of a wound VAC tomorrow as per podiatry    DM2: poorly controlled, HbA1c 11.8. Noncompliant w/ prescibed meds. Will need inuslin at d/c. Continue on glargine, SSI w/ accuchecks  Leukocytosis: labile, secondary to above infection. Continue on IV abxs     DVT prophylaxis: lovenox Code Status: full  Family Communication:  Disposition Plan: unclear  Level of care: Med-Surg  Status is: Inpatient  Remains inpatient appropriate because:Ongoing diagnostic testing needed not appropriate for outpatient work up, Unsafe d/c plan, IV treatments appropriate due to intensity of illness or inability to take PO, and Inpatient level of care appropriate due to severity of illness will likely go back to the OR tomorrow or 8/30   Dispo: The patient is from: Home              Anticipated d/c is to: Home              Patient currently is not medically stable to d/c.   Difficult to place patient : unclear   Consultants:  ID Podiatry   Procedures:   Antimicrobials: cefazolin    Subjective: Pt c/o malaise   Objective: Vitals:   11/07/20 1651 11/07/20 2022 11/08/20 0420 11/08/20 0437  BP: 110/68 (!) 143/81 100/65   Pulse: 82 92 76   Resp: '16 20 16   '$ Temp:  97.9 F (36.6 C) 99 F (37.2 C) 97.8 F (36.6 C)   TempSrc: Oral     SpO2: 100% 95% 95%   Weight:    96 kg  Height:        Intake/Output Summary (Last 24 hours) at 11/08/2020 0736 Last data filed at 11/07/2020 1856 Gross per 24 hour  Intake 720 ml  Output 800 ml  Net -80 ml   Filed Weights   11/06/20 0333 11/07/20 0559 11/08/20 0437  Weight: 95.4 kg 98.5 kg 96 kg    Examination:  General exam: Appears calm & comfortable  Respiratory system: Clear breath sounds b/l  Cardiovascular system: S1/S2+. No rubs or clicks  Gastrointestinal system: Abd is soft, NT, ND & normal bowel sounds Central nervous system: Alert and oriented. Moves all extremities Extremities: right foot is dressed and dressing is C/D/I  Psychiatry: Judgement and insight appear normal. Appropriate mood and affect    Data Reviewed: I have personally reviewed following labs and imaging studies  CBC: Recent Labs  Lab 11/01/20 0956 11/02/20 0342 11/04/20 0435 11/05/20 0500 11/06/20 0446 11/07/20 0443 11/08/20 0416  WBC 18.2*   < > 13.4* 11.7* 13.5* 16.8* 14.5*  NEUTROABS 13.9*  --   --   --   --   --   --   HGB 13.0   < > 11.5* 11.7* 11.5* 10.9* 9.4*  HCT 37.2*   < >  35.0* 35.7* 35.5* 33.5* 29.3*  MCV 92.5   < > 96.2 92.7 93.9 95.7 96.7  PLT 202   < > 258 283 312 315 340   < > = values in this interval not displayed.   Basic Metabolic Panel: Recent Labs  Lab 11/04/20 0435 11/05/20 0500 11/06/20 0446 11/07/20 0443 11/08/20 0416  NA 136 135 134* 137 135  K 3.6 3.7 3.9 3.7 3.5  CL 101 101 98 98 100  CO2 '28 27 31 '$ 32 30  GLUCOSE 182* 162* 199* 163* 187*  BUN '18 13 16 14 17  '$ CREATININE 0.65 0.58* 0.72 0.72 0.65  CALCIUM 7.9* 7.8* 8.3* 8.2* 7.9*   GFR: Estimated Creatinine Clearance: 119.3 mL/min (by C-G formula based on SCr of 0.65 mg/dL). Liver Function Tests: Recent Labs  Lab 11/01/20 0956  AST 11*  ALT 12  ALKPHOS 74  BILITOT 1.6*  PROT 6.2*  ALBUMIN 3.1*   No results for  input(s): LIPASE, AMYLASE in the last 168 hours. No results for input(s): AMMONIA in the last 168 hours. Coagulation Profile: Recent Labs  Lab 11/01/20 0956  INR 1.1   Cardiac Enzymes: No results for input(s): CKTOTAL, CKMB, CKMBINDEX, TROPONINI in the last 168 hours. BNP (last 3 results) No results for input(s): PROBNP in the last 8760 hours. HbA1C: No results for input(s): HGBA1C in the last 72 hours.  CBG: Recent Labs  Lab 11/07/20 1230 11/07/20 1630 11/07/20 2131 11/08/20 0056 11/08/20 0417  GLUCAP 248* 180* 230* 289* 185*   Lipid Profile: No results for input(s): CHOL, HDL, LDLCALC, TRIG, CHOLHDL, LDLDIRECT in the last 72 hours. Thyroid Function Tests: No results for input(s): TSH, T4TOTAL, FREET4, T3FREE, THYROIDAB in the last 72 hours. Anemia Panel: No results for input(s): VITAMINB12, FOLATE, FERRITIN, TIBC, IRON, RETICCTPCT in the last 72 hours. Sepsis Labs: Recent Labs  Lab 11/01/20 0956  LATICACIDVEN 1.3    Recent Results (from the past 240 hour(s))  Blood culture (single)     Status: None   Collection Time: 11/01/20  9:56 AM   Specimen: BLOOD  Result Value Ref Range Status   Specimen Description BLOOD LEFT FA  Final   Special Requests   Final    BOTTLES DRAWN AEROBIC AND ANAEROBIC Blood Culture adequate volume   Culture   Final    NO GROWTH 5 DAYS Performed at [facility], [address] [place], [place] [address]    Report Status 11/06/2020 FINAL  Final  Aerobic Culture w Gram Stain (superficial specimen)     Status: None   Collection Time: 11/01/20 10:26 AM   Specimen: Foot  Result Value Ref Range Status   Specimen Description   Final    FOOT RIGHT Performed at [facility], [address], [place], [place] [address]    Special Requests   Final    NONE Performed at [facility], [address] [place], [place] [address]    Gram Stain   Final    NO ORGANISMS SEEN SQUAMOUS EPITHELIAL CELLS PRESENT RARE WBC  PRESENT, PREDOMINANTLY MONONUCLEAR NO ORGANISMS SEEN BACTERIA    Culture   Final    RARE STAPHYLOCOCCUS AUREUS RARE STREPTOCOCCUS AGALACTIAE TESTING AGAINST S. AGALACTIAE NOT ROUTINELY PERFORMED DUE TO PREDICTABILITY OF AMP/PEN/VAN SUSCEPTIBILITY. Performed at [facility], [place] [address] [place], [place] [address]    Report Status 11/04/2020 FINAL  Final   Organism ID, Bacteria STAPHYLOCOCCUS AUREUS  Final      Susceptibility   Staphylococcus aureus -  MIC*    CIPROFLOXACIN <=0.5 SENSITIVE Sensitive     ERYTHROMYCIN <=0.25 SENSITIVE Sensitive     GENTAMICIN <=0.5 SENSITIVE Sensitive     OXACILLIN <=0.25 SENSITIVE Sensitive     TETRACYCLINE <=1 SENSITIVE Sensitive     VANCOMYCIN 1 SENSITIVE Sensitive     TRIMETH/SULFA <=10 SENSITIVE Sensitive     CLINDAMYCIN <=0.25 SENSITIVE Sensitive     RIFAMPIN <=0.5 SENSITIVE Sensitive     Inducible Clindamycin NEGATIVE Sensitive     * RARE STAPHYLOCOCCUS AUREUS  Resp Panel by RT-PCR (Flu A&B, Covid) Nasopharyngeal Swab     Status: None   Collection Time: 11/01/20 11:01 AM   Specimen: Nasopharyngeal Swab; Nasopharyngeal(NP) swabs in vial transport medium  Result Value Ref Range Status   SARS Coronavirus 2 by RT PCR NEGATIVE NEGATIVE Final    Comment: (NOTE) SARS-CoV-2 target nucleic acids are NOT DETECTED.  The SARS-CoV-2 RNA is generally detectable in upper respiratory specimens during the acute phase of infection. The lowest concentration of SARS-CoV-2 viral copies this assay can detect is 138 copies/mL. A negative result does not preclude SARS-Cov-2 infection and should not be used as the sole basis for treatment or other patient management decisions. A negative result may occur with  improper specimen collection/handling, submission of specimen other than nasopharyngeal swab, presence of viral mutation(s) within the areas targeted by this assay, and inadequate number of viral copies(<138 copies/mL). A negative result must be  combined with clinical observations, patient history, and epidemiological information. The expected result is Negative.  Fact Sheet for Patients:  EntrepreneurPulse.com.au  Fact Sheet for Healthcare Providers:  IncredibleEmployment.be  This test is no t yet approved or cleared by the [place] FDA and  has been authorized for detection and/or diagnosis of SARS-CoV-2 by FDA under an Emergency Use Authorization (EUA). This EUA will remain  in effect (meaning this test can be used) for the duration of the COVID-19 declaration under Section 564(b)(1) of the Act, 21 U.S.C.section 360bbb-3(b)(1), unless the authorization is terminated  or revoked sooner.       Influenza A by PCR NEGATIVE NEGATIVE Final   Influenza B by PCR NEGATIVE NEGATIVE Final    Comment: (NOTE) The Xpert Xpress SARS-CoV-2/FLU/RSV plus assay is intended as an aid in the diagnosis of influenza from Nasopharyngeal swab specimens and should not be used as a sole basis for treatment. Nasal washings and aspirates are unacceptable for Xpert Xpress SARS-CoV-2/FLU/RSV testing.  Fact Sheet for Patients: EntrepreneurPulse.com.au  Fact Sheet for Healthcare Providers: IncredibleEmployment.be  This test is not yet approved or cleared by the [place] FDA and has been authorized for detection and/or diagnosis of SARS-CoV-2 by FDA under an Emergency Use Authorization (EUA). This EUA will remain in effect (meaning this test can be used) for the duration of the COVID-19 declaration under Section 564(b)(1) of the Act, 21 U.S.C. section 360bbb-3(b)(1), unless the authorization is terminated or revoked.  Performed at [facility], [place], [place] [address]   Aerobic/Anaerobic Culture w Gram Stain (surgical/deep wound)     Status: None   Collection Time: 11/01/20  5:28 PM   Specimen: PATH Other; Tissue  Result Value Ref  Range Status   Specimen Description WOUND RIGHT FOOT  Final   Special Requests 2ND TOE  Final   Gram Stain   Final    RARE WBC PRESENT, PREDOMINANTLY MONONUCLEAR ABUNDANT GRAM POSITIVE COCCI    Culture   Final    MODERATE STAPHYLOCOCCUS AUREUS ABUNDANT STREPTOCOCCUS AGALACTIAE TESTING  AGAINST S. AGALACTIAE NOT ROUTINELY PERFORMED DUE TO PREDICTABILITY OF AMP/PEN/VAN SUSCEPTIBILITY. WITHIN MIXED ORGANISMS NO ANAEROBES ISOLATED Performed at [facility], [place] [address], [place], [place] [address]    Report Status 11/07/2020 FINAL  Final   Organism ID, Bacteria STAPHYLOCOCCUS AUREUS  Final      Susceptibility   Staphylococcus aureus - MIC*    CIPROFLOXACIN <=0.5 SENSITIVE Sensitive     ERYTHROMYCIN RESISTANT Resistant     GENTAMICIN <=0.5 SENSITIVE Sensitive     OXACILLIN 0.5 SENSITIVE Sensitive     TETRACYCLINE <=1 SENSITIVE Sensitive     VANCOMYCIN 1 SENSITIVE Sensitive     TRIMETH/SULFA <=10 SENSITIVE Sensitive     CLINDAMYCIN RESISTANT Resistant     RIFAMPIN <=0.5 SENSITIVE Sensitive     Inducible Clindamycin POSITIVE Resistant     * MODERATE STAPHYLOCOCCUS AUREUS         Radiology Studies: No results found.      Scheduled Meds:  (feeding supplement) PROSource Plus  30 mL Oral TID BM   enoxaparin (LOVENOX) injection  40 mg Subcutaneous Q24H   feeding supplement (GLUCERNA SHAKE)  237 mL Oral TID BM   insulin aspart  0-15 Units Subcutaneous Q4H   insulin glargine-yfgn  5 Units Subcutaneous Daily   multivitamin with minerals  1 tablet Oral Daily   nutrition supplement (JUVEN)  1 packet Oral BID BM   Continuous Infusions:   ceFAZolin (ANCEF) IV 2 g (11/08/20 0436)     LOS: 7 days    Time spent: 30 mins    Wyvonnia Dusky, MD [facility] Pager [phone]  If 7PM-7AM, please contact night-coverage 11/08/2020, 7:36 AM

## 2020-11-08 NOTE — Progress Notes (Signed)
   Date of Admission:  11/01/2020     ID: Fernando Vega is a 50 y.o. male  Principal Problem:   Necrotizing soft tissue infection Active Problems:   Puncture wound   Type 2 diabetes mellitus with peripheral neuropathy ([place])   Diabetic foot infection ([place])    Subjective: Pt underwent amputation o 2nd and 3rd toe on Friday After dressing change today there is a lot of bleeding  Medications:   (feeding supplement) PROSource Plus  30 mL Oral TID BM   enoxaparin (LOVENOX) injection  40 mg Subcutaneous Q24H   feeding supplement (GLUCERNA SHAKE)  237 mL Oral TID BM   insulin aspart  0-15 Units Subcutaneous Q4H   insulin glargine-yfgn  5 Units Subcutaneous Daily   multivitamin with minerals  1 tablet Oral Daily   nutrition supplement (JUVEN)  1 packet Oral BID BM    Objective: Vital signs in last 24 hours: Temp:  [97.8 F (36.6 C)-99 F (37.2 C)] 97.9 F (36.6 C) (08/29 0831) Pulse Rate:  [76-92] 80 (08/29 0831) Resp:  [16-20] 19 (08/29 0831) BP: (100-143)/(65-83) 137/83 (08/29 0831) SpO2:  [95 %-100 %] 99 % (08/29 0831) Weight:  [96 kg] 96 kg (08/29 0437)  PHYSICAL EXAM:  General:awake, alert, anxious  Lungs: Clear to auscultation bilaterally. No Wheezing or Rhonchi. No rales. Heart: Regular rate and rhythm, no murmur, rub or gallop. Neurologic: Grossly non-focal Rt foot- dressing removed by surgeon ' Pooling of blood at the amputation site Lab Results Recent Labs    11/07/20 0443 11/08/20 0416  WBC 16.8* 14.5*  HGB 10.9* 9.4*  HCT 33.5* 29.3*  NA 137 135  K 3.7 3.5  CL 98 100  CO2 32 30  BUN 14 17  CREATININE 0.72 0.65   Microbiology:  Studies/Results: No results found.   Assessment/Plan: Diabetic foot infection- s/p amputation of 2nd and 3 rd toe on the rt foot for ischemia on Friday Prior to that he had debridement MSSA and GBS in the wound Currently on cefazolin  Bleeder at the surgical site- managed by podiatrist today  Discussed the management  with the patient and podiatrist

## 2020-11-08 NOTE — Progress Notes (Signed)
Inpatient Diabetes Program Recommendations  AACE/ADA: New Consensus Statement on Inpatient Glycemic Control (2015)  Target Ranges:  Prepandial:   less than 140 mg/dL      Peak postprandial:   less than 180 mg/dL (1-2 hours)      Critically ill patients:  140 - 180 mg/dL  Results for Fernando Vega, Fernando Vega (MRN [id number]) as of 11/08/2020 08:02  Ref. Range 11/07/2020 00:19 11/07/2020 04:53 11/07/2020 08:09 11/07/2020 12:30 11/07/2020 16:30 11/07/2020 21:31  Glucose-Capillary Latest Ref Range: 70 - 99 mg/dL 215 (H)  5 units Novolog  156 (H)  3 units Novolog  152 (H)  3 units Novolog  5 units Semglee  248 (H)  5 units Novolog  180 (H)  3 units Novolog   230 (H)  5 units Novolog   Results for Fernando Vega, Fernando Vega (MRN [id number]) as of 11/08/2020 08:02  Ref. Range 11/08/2020 00:56 11/08/2020 04:17 11/08/2020 07:38  Glucose-Capillary Latest Ref Range: 70 - 99 mg/dL 289 (H)  8 units Novolog  185 (H)  3 units Novolog  136 (H)   Home DM Meds: None   Current Orders: Semglee 5 units Daily                           Novolog Moderate Correction Scale/ SSI (0-15 units) Q4 hours     No Insurance No PCP TOC team working with patient     MD- Please consider:   1. Change Novolog SSI to TID AC + HS (currently ordered Q4 hours and pt eating meals and drinking PO supps)   2. Start Novolog Meal Coverage: Novolog 5 units TID with meals Hold if pt eats <50% of meal, Hold if pt NPO    --Will follow patient during hospitalization--  Wyn Quaker RN, MSN, CDE Diabetes Coordinator Inpatient Glycemic Control Team Team Pager: [phone] (8a-5p)

## 2020-11-09 ENCOUNTER — Encounter
Admission: EM | Disposition: A | Discharge status: Home / Self Care | Payer: Self-pay | Source: Home / Self Care | Attending: Internal Medicine

## 2020-11-09 ENCOUNTER — Inpatient Hospital Stay: Payer: Medicaid Other | Admitting: Anesthesiology

## 2020-11-09 HISTORY — PX: APPLICATION OF WOUND VAC: SHX5189

## 2020-11-09 HISTORY — PX: IRRIGATION AND DEBRIDEMENT FOOT: SHX6602

## 2020-11-09 LAB — GLUCOSE, CAPILLARY
Glucose-Capillary: 241 mg/dL — ABNORMAL HIGH (ref 70–99)
Glucose-Capillary: 187 mg/dL — ABNORMAL HIGH (ref 70–99)
Glucose-Capillary: 179 mg/dL — ABNORMAL HIGH (ref 70–99)
Glucose-Capillary: 179 mg/dL — ABNORMAL HIGH (ref 70–99)
Glucose-Capillary: 162 mg/dL — ABNORMAL HIGH (ref 70–99)
Glucose-Capillary: 152 mg/dL — ABNORMAL HIGH (ref 70–99)
Glucose-Capillary: 219 mg/dL — ABNORMAL HIGH (ref 70–99)

## 2020-11-09 LAB — CBC
HCT: 27.4 % — ABNORMAL LOW (ref 39.0–52.0)
Hemoglobin: 8.7 g/dL — ABNORMAL LOW (ref 13.0–17.0)
MCH: 30.2 pg (ref 26.0–34.0)
MCHC: 31.8 g/dL (ref 30.0–36.0)
MCV: 95.1 fL (ref 80.0–100.0)
Platelets: 380 10*3/uL (ref 150–400)
RBC: 2.88 MIL/uL — ABNORMAL LOW (ref 4.22–5.81)
RDW: 12 % (ref 11.5–15.5)
WBC: 11.7 10*3/uL — ABNORMAL HIGH (ref 4.0–10.5)
nRBC: 0.2 % (ref 0.0–0.2)

## 2020-11-09 LAB — BASIC METABOLIC PANEL
Anion gap: 7 (ref 5–15)
BUN: 15 mg/dL (ref 6–20)
CO2: 31 mmol/L (ref 22–32)
Calcium: 7.7 mg/dL — ABNORMAL LOW (ref 8.9–10.3)
Chloride: 97 mmol/L — ABNORMAL LOW (ref 98–111)
Creatinine, Ser: 0.65 mg/dL (ref 0.61–1.24)
GFR, Estimated: >60 mL/min (ref >60)
Glucose, Bld: 206 mg/dL — ABNORMAL HIGH (ref 70–99)
Potassium: 3.7 mmol/L (ref 3.5–5.1)
Sodium: 135 mmol/L (ref 135–145)

## 2020-11-09 LAB — SURGICAL PATHOLOGY

## 2020-11-09 SURGERY — IRRIGATION AND DEBRIDEMENT FOOT
Anesthesia: General | Laterality: Right

## 2020-11-09 MED ORDER — FENTANYL CITRATE (PF) 100 MCG/2ML IJ SOLN: 25.0000 ug | INTRAMUSCULAR | Status: DC | PRN

## 2020-11-09 MED ORDER — MIDAZOLAM HCL 2 MG/2ML IJ SOLN
INTRAMUSCULAR | Status: AC
  Filled 2020-11-09: qty 2

## 2020-11-09 MED ORDER — FENTANYL CITRATE (PF) 100 MCG/2ML IJ SOLN
INTRAMUSCULAR | Status: AC
  Filled 2020-11-09: qty 2

## 2020-11-09 MED ORDER — PROPOFOL 10 MG/ML IV BOLUS
INTRAVENOUS | Status: AC
  Filled 2020-11-09: qty 20

## 2020-11-09 MED ORDER — ENSURE MAX PROTEIN PO LIQD
11.0000 [oz_av] | Freq: Two times a day (BID) | ORAL | Status: DC
  Administered 2020-11-13 – 2020-11-14 (×4): 11 [oz_av] via ORAL
  Filled 2020-11-09: qty 330

## 2020-11-09 MED ORDER — OXYCODONE HCL 5 MG/5ML PO SOLN: 5.0000 mg | Freq: Once | ORAL | Status: DC | PRN

## 2020-11-09 MED ORDER — ASCORBIC ACID 500 MG PO TABS
250.0000 mg | ORAL_TABLET | Freq: Every day | ORAL | Status: DC
  Administered 2020-11-10 – 2020-11-16 (×7): 250 mg via ORAL
  Filled 2020-11-09 (×7): qty 1

## 2020-11-09 MED ORDER — MIDAZOLAM HCL 2 MG/2ML IJ SOLN
INTRAMUSCULAR | Status: DC | PRN
  Administered 2020-11-09: 2 mg via INTRAVENOUS

## 2020-11-09 MED ORDER — ACETAMINOPHEN 10 MG/ML IV SOLN: 1000.0000 mg | Freq: Once | INTRAVENOUS | Status: DC | PRN

## 2020-11-09 MED ORDER — BUPIVACAINE HCL 0.5 % IJ SOLN
INTRAMUSCULAR | Status: DC | PRN
  Administered 2020-11-09: 10 mL

## 2020-11-09 MED ORDER — PROMETHAZINE HCL 25 MG/ML IJ SOLN
6.2500 mg | INTRAMUSCULAR | Status: DC | PRN
Start: 2020-11-09 — End: 2020-11-09

## 2020-11-09 MED ORDER — SODIUM CHLORIDE 0.9 % IV SOLN: INTRAVENOUS | Status: DC | PRN

## 2020-11-09 MED ORDER — 0.9 % SODIUM CHLORIDE (POUR BTL) OPTIME
TOPICAL | Status: DC | PRN
  Administered 2020-11-09: 1000 mL

## 2020-11-09 MED ORDER — OXYCODONE HCL 5 MG PO TABS: 5.0000 mg | ORAL_TABLET | Freq: Once | ORAL | Status: DC | PRN

## 2020-11-09 MED ORDER — FENTANYL CITRATE (PF) 100 MCG/2ML IJ SOLN
INTRAMUSCULAR | Status: DC | PRN
  Administered 2020-11-09: 50 ug via INTRAVENOUS

## 2020-11-09 MED ORDER — PROPOFOL 500 MG/50ML IV EMUL
INTRAVENOUS | Status: DC | PRN
  Administered 2020-11-09: 100 ug/kg/min via INTRAVENOUS

## 2020-11-09 MED ORDER — LIDOCAINE HCL 1 % IJ SOLN
INTRAMUSCULAR | Status: DC | PRN
  Administered 2020-11-09: 10 mL

## 2020-11-09 SURGICAL SUPPLY — 60 items
BNDG COHESIVE 4X5 TAN ST LF (GAUZE/BANDAGES/DRESSINGS) ×2 IMPLANT
BNDG COHESIVE 6X5 TAN ST LF (GAUZE/BANDAGES/DRESSINGS) ×2 IMPLANT
BNDG CONFORM 2 STRL LF (GAUZE/BANDAGES/DRESSINGS) ×2 IMPLANT
BNDG CONFORM 3 STRL LF (GAUZE/BANDAGES/DRESSINGS) ×2 IMPLANT
BNDG ELASTIC 4X5.8 VLCR STR LF (GAUZE/BANDAGES/DRESSINGS) ×2 IMPLANT
BNDG ESMARK 4X12 TAN STRL LF (GAUZE/BANDAGES/DRESSINGS) ×2 IMPLANT
BNDG GAUZE ELAST 4 BULKY (GAUZE/BANDAGES/DRESSINGS) ×2 IMPLANT
BOOT STEPPER DURA MED (SOFTGOODS) ×1 IMPLANT
CUFF TOURN SGL QUICK 18X4 (TOURNIQUET CUFF) IMPLANT
CUFF TOURN SGL QUICK 24 (TOURNIQUET CUFF)
CUFF TRNQT CYL 24X4X16.5-23 (TOURNIQUET CUFF) IMPLANT
DRSG ADAPTIC 3X8 NADH LF (GAUZE/BANDAGES/DRESSINGS) ×2 IMPLANT
DURAPREP 26ML APPLICATOR (WOUND CARE) ×2 IMPLANT
ELECT REM PT RETURN 9FT ADLT (ELECTROSURGICAL) ×2
ELECTRODE REM PT RTRN 9FT ADLT (ELECTROSURGICAL) ×1 IMPLANT
GAUZE 4X4 16PLY ~~LOC~~+RFID DBL (SPONGE) ×2 IMPLANT
GAUZE PACKING 1/4 X5 YD (GAUZE/BANDAGES/DRESSINGS) IMPLANT
GAUZE PACKING IODOFORM 1X5 (PACKING) IMPLANT
GAUZE SPONGE 4X4 12PLY STRL (GAUZE/BANDAGES/DRESSINGS) ×2 IMPLANT
GLOVE SRG 8 PF TXTR STRL LF DI (GLOVE) ×1 IMPLANT
GLOVE SURG ENC TEXT LTX SZ8 (GLOVE) ×2 IMPLANT
GLOVE SURG UNDER POLY LF SZ8 (GLOVE) ×1
GOWN STRL REUS W/ TWL XL LVL3 (GOWN DISPOSABLE) ×1 IMPLANT
GOWN STRL REUS W/TWL MED LVL3 (GOWN DISPOSABLE) ×2 IMPLANT
GOWN STRL REUS W/TWL XL LVL3 (GOWN DISPOSABLE) ×1
HANDPIECE VERSAJET DEBRIDEMENT (MISCELLANEOUS) IMPLANT
IV NS 1000ML (IV SOLUTION) ×1
IV NS 1000ML BAXH (IV SOLUTION) ×1 IMPLANT
IV NS IRRIG 3000ML ARTHROMATIC (IV SOLUTION) IMPLANT
KIT DRSG VAC SLVR GRANUFM (MISCELLANEOUS) ×1 IMPLANT
KIT TURNOVER KIT A (KITS) ×2 IMPLANT
LABEL OR SOLS (LABEL) ×2 IMPLANT
MANIFOLD NEPTUNE II (INSTRUMENTS) ×2 IMPLANT
MICROMATRIX 1000MG (Tissue) ×2 IMPLANT
NDL FILTER BLUNT 18X1 1/2 (NEEDLE) ×1 IMPLANT
NDL HYPO 25X1 1.5 SAFETY (NEEDLE) ×1 IMPLANT
NEEDLE FILTER BLUNT 18X 1/2SAF (NEEDLE) ×1
NEEDLE FILTER BLUNT 18X1 1/2 (NEEDLE) ×1 IMPLANT
NEEDLE HYPO 25X1 1.5 SAFETY (NEEDLE) ×2 IMPLANT
NS IRRIG 500ML POUR BTL (IV SOLUTION) ×2 IMPLANT
PACK EXTREMITY ARMC (MISCELLANEOUS) ×2 IMPLANT
PAD ABD DERMACEA PRESS 5X9 (GAUZE/BANDAGES/DRESSINGS) ×2 IMPLANT
PULSAVAC PLUS IRRIG FAN TIP (DISPOSABLE)
SOL PREP PVP 2OZ (MISCELLANEOUS) ×2
SOLUTION PARTIC MCRMTRX 1000MG (Tissue) IMPLANT
SOLUTION PREP PVP 2OZ (MISCELLANEOUS) ×1 IMPLANT
STAPLER SKIN PROX 35W (STAPLE) IMPLANT
STOCKINETTE IMPERVIOUS 9X36 MD (GAUZE/BANDAGES/DRESSINGS) ×2 IMPLANT
SUT ETHILON 2 0 FS 18 (SUTURE) IMPLANT
SUT ETHILON 4-0 (SUTURE)
SUT ETHILON 4-0 FS2 18XMFL BLK (SUTURE)
SUT PROLENE 3 0 PS 2 (SUTURE) IMPLANT
SUT VIC AB 3-0 SH 27 (SUTURE)
SUT VIC AB 3-0 SH 27X BRD (SUTURE) IMPLANT
SUT VIC AB 4-0 FS2 27 (SUTURE) IMPLANT
SUTURE ETHLN 4-0 FS2 18XMF BLK (SUTURE) IMPLANT
SWAB CULTURE AMIES ANAERIB BLU (MISCELLANEOUS) IMPLANT
SYR 10ML LL (SYRINGE) ×4 IMPLANT
TIP FAN IRRIG PULSAVAC PLUS (DISPOSABLE) IMPLANT
WATER STERILE IRR 500ML POUR (IV SOLUTION) ×2 IMPLANT

## 2020-11-09 NOTE — Progress Notes (Signed)
PROGRESS NOTE   HPI was taken from Dr. Francine Graven:  Fernando Vega is a 50 y.o. male with medical history significant for diabetes mellitus with complications of peripheral neuropathy who presents to the emergency room for evaluation of a foot injury which happened about 3 days prior to his presentation. Patient states that he was at a junk yard and probably stepped on a screw that went through his shoes.  He did not feel any pain at the time and only noticed he was injured after he got home and saw blood draining from his right foot after taking a shower.  He states that his son recommended cleaning the foot with peroxide and Epsom salt which he did and he wrapped it up but he noticed that over the last couple of days its had an increased foul-smelling drainage. He has a history of diabetes mellitus and is supposed to be on metformin but he does not take it because it causes diarrhea. He denies having any fever or chills, no chest pain, no shortness of breath, no abdominal pain, no nausea, no vomiting, no changes in his bowel habits, no dizziness, no lightheadedness, no headache, no palpitations or diaphoresis. Labs show sodium 134, potassium 3.9, chloride 98, bicarb 25, glucose 278, BUN 14, creatinine 0.83, calcium 8.4, alkaline phosphatase 74, albumin 3.1, AST 11, ALT 12, total protein 6.2, total bilirubin 1.8, lactic acid 1.3, white count 18.2, hemoglobin 13, hematocrit 37.3, MCV 92.5, RDW 11.9, platelet count 202, PT 14.0, INR 1.1 Respiratory viral panel is pending X-ray of the right foot shows soft tissue emphysema at the base of the second digit suggesting necrotizing infection.  No evidence of osteomyelitis.  No opaque foreign body.  Hospital course from Dr. Jimmye Norman 8/24-8/30/22: Pt has a necrotizing soft tissue infection of the right foot involving the right second toe, following a puncture wound. Pt has severe peripheral neuropathy has no feelings in his feet as per pt. S/p I&D on 8/22. Wound cx  growing stap aureus, strep agalactiae. Continue on IV cefazolin as per ID. 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 Will go back to the OR for irrigation, debridement & possible placement of a wound VAC this afternoon.    Fernando Vega  [id number] DOB: Mar 01, 1971 DOA: 11/01/2020 PCP: Patient, No Pcp Per (Inactive)  Assessment & Plan:   Principal Problem:   Necrotizing soft tissue infection Active Problems:   Puncture wound   Type 2 diabetes mellitus with peripheral neuropathy (HCC)   Diabetic foot infection ([place])  Necrotizing soft tissue infection of right foot: involving the right second toe, following a puncture wound. S/p I&D on 8/22. Wound cx growing stap aureus, strep agalactiae. Continue on IV cefazolin as per ID. 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 Will go back to the OR for irrigation, debridement & possible placement of a wound VAC today as per podiatry   DM2: HbA1c 11.8, poorly controlled. Noncompliant w/ prescribed meds. Will need insulin at d/c. Continue on glargine, SSI w/ accuchecks  Leukocytosis: secondary to above infection. Continue on IV abxs    Likely acute blood loss anemia: likely secondary to above surgeries. No need for a transfusion currently    DVT prophylaxis: lovenox Code Status: full  Family Communication:  Disposition Plan: unclear  Level of care: Med-Surg  Status is: Inpatient  Remains inpatient appropriate because:Ongoing diagnostic testing needed not appropriate for outpatient work up, Unsafe d/c plan, IV treatments appropriate due to intensity of illness or inability to take PO, and Inpatient level of care appropriate due to severity of illness will likely go back to the OR tomorrow or 8/30   Dispo: The patient is from: Home               Anticipated d/c is to: Home              Patient currently is not medically stable to d/c.   Difficult to place patient : unclear   Consultants:  ID Podiatry   Procedures:   Antimicrobials: cefazolin    Subjective: Pt c/o foot pain   Objective: Vitals:   11/08/20 0831 11/08/20 1658 11/08/20 2239 11/09/20 0619  BP: 137/83 127/71 116/69 136/78  Pulse: 80 89 91 89  Resp: '19 18 17 17  '$ Temp: 97.9 F (36.6 C) 99.3 F (37.4 C) 98.7 F (37.1 C) 98.5 F (36.9 C)  TempSrc:      SpO2: 99% 94% 94% 97%  Weight:      Height:       No intake or output data in the 24 hours ending 11/09/20 0801  Filed Weights   11/06/20 0333 11/07/20 0559 11/08/20 0437  Weight: 95.4 kg 98.5 kg 96 kg    Examination:  General exam: Appears comfortable  Respiratory system: Clear breath sounds b/l. No rales  Cardiovascular system: S1 & S2+. No rubs or clicks   Gastrointestinal system: Abd is soft, NT, ND & normal bowel sounds  Central nervous system: Alert and oriented. Moves all extremities  Extremities: right foot is dressed and dressing is C/D/I  Psychiatry: Judgement and insight appear normal. Appropriate mood and affect    Data Reviewed: I have personally reviewed following labs and imaging studies  CBC: Recent Labs  Lab 11/05/20 0500 11/06/20 0446 11/07/20 0443 11/08/20 0416 11/09/20 0446  WBC 11.7* 13.5* 16.8* 14.5* 11.7*  HGB 11.7* 11.5* 10.9* 9.4* 8.7*  HCT 35.7* 35.5* 33.5* 29.3* 27.4*  MCV 92.7 93.9 95.7 96.7 95.1  PLT 283 312 315 340 [id number]   Basic Metabolic Panel: Recent Labs  Lab 11/05/20 0500 11/06/20 0446 11/07/20 0443 11/08/20 0416 11/09/20 0446  NA 135 134* 137 135 135  K 3.7 3.9 3.7 3.5 3.7  CL 101 98 98 100 97*  CO2 27 31 32 30 31  GLUCOSE 162* 199* 163* 187* 206*  BUN '13 16 14 17 15  '$ CREATININE 0.58* 0.72 0.72 0.65 0.65  CALCIUM 7.8* 8.3* 8.2* 7.9* 7.7*   GFR: Estimated Creatinine Clearance: 119.3 mL/min (by C-G formula based on SCr of 0.65  mg/dL). Liver Function Tests: No results for input(s): AST, ALT, ALKPHOS, BILITOT, PROT, ALBUMIN in the last 168 hours.  No results for input(s): LIPASE, AMYLASE in the last 168 hours. No results for input(s): AMMONIA in the last 168 hours. Coagulation Profile: No results for input(s): INR, PROTIME in the last 168 hours.  Cardiac Enzymes: No results for input(s): CKTOTAL, CKMB, CKMBINDEX, TROPONINI in the last 168 hours. BNP (last 3 results) No results for input(s): PROBNP in the last 8760 hours. HbA1C: No results for input(s): HGBA1C in the last 72 hours.  CBG: Recent Labs  Lab 11/08/20 1238 11/08/20 1658 11/08/20 2048 11/09/20 0059 11/09/20 0421  GLUCAP 211* 219* 190* 241* 187*   Lipid Profile: No results for input(s): CHOL, HDL,  LDLCALC, TRIG, CHOLHDL, LDLDIRECT in the last 72 hours. Thyroid Function Tests: No results for input(s): TSH, T4TOTAL, FREET4, T3FREE, THYROIDAB in the last 72 hours. Anemia Panel: No results for input(s): VITAMINB12, FOLATE, FERRITIN, TIBC, IRON, RETICCTPCT in the last 72 hours. Sepsis Labs: No results for input(s): PROCALCITON, LATICACIDVEN in the last 168 hours.   Recent Results (from the past 240 hour(s))  Blood culture (single)     Status: None   Collection Time: 11/01/20  9:56 AM   Specimen: BLOOD  Result Value Ref Range Status   Specimen Description BLOOD LEFT FA  Final   Special Requests   Final    BOTTLES DRAWN AEROBIC AND ANAEROBIC Blood Culture adequate volume   Culture   Final    NO GROWTH 5 DAYS Performed at [facility], [address], [place], [place] [address]    Report Status 11/06/2020 FINAL  Final  Aerobic Culture w Gram Stain (superficial specimen)     Status: None   Collection Time: 11/01/20 10:26 AM   Specimen: Foot  Result Value Ref Range Status   Specimen Description   Final    FOOT RIGHT Performed at [facility], [address], [place], [place] [address]    Special Requests   Final     NONE Performed at [facility], [address] [place], [place] [address]    Gram Stain   Final    NO ORGANISMS SEEN SQUAMOUS EPITHELIAL CELLS PRESENT RARE WBC PRESENT, PREDOMINANTLY MONONUCLEAR NO ORGANISMS SEEN BACTERIA    Culture   Final    RARE STAPHYLOCOCCUS AUREUS RARE STREPTOCOCCUS AGALACTIAE TESTING AGAINST S. AGALACTIAE NOT ROUTINELY PERFORMED DUE TO PREDICTABILITY OF AMP/PEN/VAN SUSCEPTIBILITY. Performed at [facility], [place] [address] [place], [place] [address]    Report Status 11/04/2020 FINAL  Final   Organism ID, Bacteria STAPHYLOCOCCUS AUREUS  Final      Susceptibility   Staphylococcus aureus - MIC*    CIPROFLOXACIN <=0.5 SENSITIVE Sensitive     ERYTHROMYCIN <=0.25 SENSITIVE Sensitive     GENTAMICIN <=0.5 SENSITIVE Sensitive     OXACILLIN <=0.25 SENSITIVE Sensitive     TETRACYCLINE <=1 SENSITIVE Sensitive     VANCOMYCIN 1 SENSITIVE Sensitive     TRIMETH/SULFA <=10 SENSITIVE Sensitive     CLINDAMYCIN <=0.25 SENSITIVE Sensitive     RIFAMPIN <=0.5 SENSITIVE Sensitive     Inducible Clindamycin NEGATIVE Sensitive     * RARE STAPHYLOCOCCUS AUREUS  Resp Panel by RT-PCR (Flu A&B, Covid) Nasopharyngeal Swab     Status: None   Collection Time: 11/01/20 11:01 AM   Specimen: Nasopharyngeal Swab; Nasopharyngeal(NP) swabs in vial transport medium  Result Value Ref Range Status   SARS Coronavirus 2 by RT PCR NEGATIVE NEGATIVE Final    Comment: (NOTE) SARS-CoV-2 target nucleic acids are NOT DETECTED.  The SARS-CoV-2 RNA is generally detectable in upper respiratory specimens during the acute phase of infection. The lowest concentration of SARS-CoV-2 viral copies this assay can detect is 138 copies/mL. A negative result does not preclude SARS-Cov-2 infection and should not be used as the sole basis for treatment or other patient management decisions. A negative result may occur with  improper specimen collection/handling, submission of specimen other than  nasopharyngeal swab, presence of viral mutation(s) within the areas targeted by this assay, and inadequate number of viral copies(<138 copies/mL). A negative result must be combined with clinical observations, patient history, and epidemiological information. The expected result is Negative.  Fact Sheet for Patients:  EntrepreneurPulse.com.au  Fact Sheet for Healthcare Providers:  IncredibleEmployment.be  This test is no t yet approved or cleared by the [place] FDA and  has been authorized for detection and/or diagnosis of SARS-CoV-2 by FDA under an Emergency Use Authorization (EUA). This EUA will remain  in effect (meaning this test can be used) for the duration of the COVID-19 declaration under Section 564(b)(1) of the Act, 21 U.S.C.section 360bbb-3(b)(1), unless the authorization is terminated  or revoked sooner.       Influenza A by PCR NEGATIVE NEGATIVE Final   Influenza B by PCR NEGATIVE NEGATIVE Final    Comment: (NOTE) The Xpert Xpress SARS-CoV-2/FLU/RSV plus assay is intended as an aid in the diagnosis of influenza from Nasopharyngeal swab specimens and should not be used as a sole basis for treatment. Nasal washings and aspirates are unacceptable for Xpert Xpress SARS-CoV-2/FLU/RSV testing.  Fact Sheet for Patients: EntrepreneurPulse.com.au  Fact Sheet for Healthcare Providers: IncredibleEmployment.be  This test is not yet approved or cleared by the [place] FDA and has been authorized for detection and/or diagnosis of SARS-CoV-2 by FDA under an Emergency Use Authorization (EUA). This EUA will remain in effect (meaning this test can be used) for the duration of the COVID-19 declaration under Section 564(b)(1) of the Act, 21 U.S.C. section 360bbb-3(b)(1), unless the authorization is terminated or revoked.  Performed at [facility], [place], [place]  [address]   Aerobic/Anaerobic Culture w Gram Stain (surgical/deep wound)     Status: None   Collection Time: 11/01/20  5:28 PM   Specimen: PATH Other; Tissue  Result Value Ref Range Status   Specimen Description WOUND RIGHT FOOT  Final   Special Requests 2ND TOE  Final   Gram Stain   Final    RARE WBC PRESENT, PREDOMINANTLY MONONUCLEAR ABUNDANT GRAM POSITIVE COCCI    Culture   Final    MODERATE STAPHYLOCOCCUS AUREUS ABUNDANT STREPTOCOCCUS AGALACTIAE TESTING AGAINST S. AGALACTIAE NOT ROUTINELY PERFORMED DUE TO PREDICTABILITY OF AMP/PEN/VAN SUSCEPTIBILITY. WITHIN MIXED ORGANISMS NO ANAEROBES ISOLATED Performed at [facility], [place] [address], [place], [place] [address]    Report Status 11/07/2020 FINAL  Final   Organism ID, Bacteria STAPHYLOCOCCUS AUREUS  Final      Susceptibility   Staphylococcus aureus - MIC*    CIPROFLOXACIN <=0.5 SENSITIVE Sensitive     ERYTHROMYCIN RESISTANT Resistant     GENTAMICIN <=0.5 SENSITIVE Sensitive     OXACILLIN 0.5 SENSITIVE Sensitive     TETRACYCLINE <=1 SENSITIVE Sensitive     VANCOMYCIN 1 SENSITIVE Sensitive     TRIMETH/SULFA <=10 SENSITIVE Sensitive     CLINDAMYCIN RESISTANT Resistant     RIFAMPIN <=0.5 SENSITIVE Sensitive     Inducible Clindamycin POSITIVE Resistant     * MODERATE STAPHYLOCOCCUS AUREUS         Radiology Studies: No results found.      Scheduled Meds:  (feeding supplement) PROSource Plus  30 mL Oral TID BM   enoxaparin (LOVENOX) injection  40 mg Subcutaneous Q24H   feeding supplement (GLUCERNA SHAKE)  237 mL Oral TID BM   insulin aspart  0-15 Units Subcutaneous Q4H   insulin glargine-yfgn  5 Units Subcutaneous Daily   multivitamin with minerals  1 tablet Oral Daily   nutrition supplement (JUVEN)  1 packet Oral BID BM   Continuous Infusions:   ceFAZolin (ANCEF) IV 2 g (11/09/20 0427)     LOS: 8 days    Time spent: 20 mins  Wyvonnia Dusky, MD [facility] Pager [phone]  If  7PM-7AM, please contact night-coverage 11/09/2020, 8:01 AM

## 2020-11-09 NOTE — Brief Op Note (Signed)
11/09/2020  7:00 PM  PATIENT:  Fernando Vega  50 y.o. male  PRE-OPERATIVE DIAGNOSIS:  Irrigation and Debridement Application of Wound Vac  POST-OPERATIVE DIAGNOSIS:  * No post-op diagnosis entered *  PROCEDURE:  Procedure(s): IRRIGATION AND DEBRIDEMENT FOOT (Right) APPLICATION OF WOUND VAC (Right)  SURGEON:  Surgeon(s) and Role:    Edrick Kins, DPM - Primary  PHYSICIAN ASSISTANT:   ASSISTANTS: none   ANESTHESIA:   local and MAC  EBL:  73m   BLOOD ADMINISTERED:none  DRAINS: none   LOCAL MEDICATIONS USED:  MARCAINE    and LIDOCAINE   SPECIMEN:  No Specimen  DISPOSITION OF SPECIMEN:  N/A  COUNTS:  YES  TOURNIQUET:  * Missing tourniquet times found for documented tourniquets in log: [id number]*  DICTATION: .DViviann SpareDictation  PLAN OF CARE: Admit to inpatient   PATIENT DISPOSITION:  PACU - hemodynamically stable.   Delay start of Pharmacological VTE agent (>24hrs) due to surgical blood loss or risk of bleeding: not applicable

## 2020-11-09 NOTE — Op Note (Signed)
OPERATIVE REPORT Patient name: Fernando Vega MRN: [id number] DOB: 1971/03/02  DOS: 11/09/2020  Preop Dx: Open surgical wound right foot Postop Dx: same  Procedure:  1.  Irrigation and debridement right 2.  Application of UBM Acell Micromatrix Wound Matrix 3. application of wound VAC right  Surgeon: Edrick Kins DPM  Anesthesia: Monitored anesthesia care with 50-50 mixture of 2% lidocaine plain with 0.5% Marcaine plain totaling 20 mL infiltrated in the patient's right lower extremity and an ankle block fashion  Hemostasis: Ankle tourniquet inflated to a pressure of 218mHg after esmarch exsanguination   EBL: 30 mL Materials: Acell Micromatrix 1000g Micromatrix Injectables: None Pathology: None  Condition: The patient tolerated the procedure and anesthesia well. No complications noted or reported   Justification for procedure: The patient is a 50y.o. male PMHx uncontrolled diabetes mellitus with PVD who presents today for surgical correction of open wound amputation site of the right foot. All conservative modalities of been unsuccessful in providing any sort of satisfactory alleviation of symptoms with the patient. The patient was told benefits as well as possible side effects of the surgery. The patient consented for surgical correction. The patient consent form was reviewed. All patient questions were answered. No guarantees were expressed or implied. The patient and the surgeon both signed the patient consent form with the witness present and placed in the patient's chart.   Procedure in Detail: The patient was brought to the operating room, placed in the operating table in the supine position at which time an aseptic scrub and drape were performed about the patient's respective lower extremity after anesthesia was induced as described above. Attention was then directed to the surgical area where procedure number one commenced.  Procedure #1: Irrigation and debridement right  foot amputation site Attention was directed to the amputation stump where light debridement was performed of any necrotic tissue using a tissue nipper and surgical #15 scalpel.  Excisional debridement of any necrotic nonviable tissue down to healthier bleeding viable tissue was performed.  This was followed by pulse lavage in combination with 3 L of normal saline for copious irrigation of the wound.  The calf tourniquet was deflated to ensure that there was no active bleeding within the amputation stump.  No active bleeding noted that needed to be addressed.  Attention was then directed to the wound base where procedure #2 commenced  Procedure #2: Application of UBM ACell micro matrix wound matrix 1000 g of ACell micro matrix was prepared with a combination of normal saline and placed along the amputation site deep into the wound.  Nonadherent Adaptic was also placed overlying the placement of the wound matrix product to prevent loss through the wound VAC  Procedure #3: Application of negative pressure wound VAC RT After application of the micro matrix and nonadherent Adaptic, a negative pressure wound vacuum was applied to the amputation stump in the standard manner.  A bridge was placed to the dorsum of the foot where the negative pressure wound VAC hose was connected and sealed.  This was tested by the wound vacuum which was adequate.  No leaking identified  Dry sterile compressive dressings were then applied to all previously mentioned incision sites about the patient's lower extremity. The tourniquet which was used for hemostasis was deflated. All normal neurovascular responses including pink color and warmth returned all the digits of patient's lower extremity.  The patient was then transferred from the operating room to the recovery room having tolerated the procedure and  anesthesia well. All vital signs are stable. After a brief stay in the recovery room the patient was readmitted to inpatient  room with adequate prescriptions for analgesia.   Edrick Kins, DPM [facility]  Dr. Edrick Kins, DPM    [address], [place] [address]                Office [phone]  Fax [phone]

## 2020-11-09 NOTE — Transfer of Care (Signed)
Immediate Anesthesia Transfer of Care Note  Patient: Fernando Vega  Procedure(s) Performed: IRRIGATION AND DEBRIDEMENT FOOT (Right) APPLICATION OF WOUND VAC (Right)  Patient Location: PACU  Anesthesia Type:MAC combined with regional for post-op pain  Level of Consciousness: awake, oriented and patient cooperative  Airway & Oxygen Therapy: Patient Spontanous Breathing  Post-op Assessment: Report given to RN and Post -op Vital signs reviewed and stable  Post vital signs: Reviewed and stable  Last Vitals:  Vitals Value Taken Time  BP 119/74 11/09/20 1835  Temp    Pulse 89 11/09/20 1839  Resp 17 11/09/20 1839  SpO2 94 % 11/09/20 1839  Vitals shown include unvalidated device data.  Last Pain:  Vitals:   11/08/20 1920  TempSrc:   PainSc: 0-No pain      Patients Stated Pain Goal: 0 (41/63/84 5364)  Complications: No notable events documented.

## 2020-11-09 NOTE — Interval H&P Note (Signed)
History and Physical Interval Note:  11/09/2020 5:15 PM  Fernando Vega  has presented today for surgery, with the diagnosis of Irrigation and Debridement Application of Wound Vac.  The various methods of treatment have been discussed with the patient and family. After consideration of risks, benefits and other options for treatment, the patient has consented to  Procedure(s): IRRIGATION AND DEBRIDEMENT FOOT (Right) APPLICATION OF WOUND VAC (Right) as a surgical intervention.  The patient's history has been reviewed, patient examined, no change in status, stable for surgery.  I have reviewed the patient's chart and labs.  Questions were answered to the patient's satisfaction.     Edrick Kins

## 2020-11-09 NOTE — Progress Notes (Signed)
Nutrition Follow-up  DOCUMENTATION CODES:  Not applicable  INTERVENTION:  Resume carb modified diet after surgery this afternoon, encourage PO intake. Continue Juven BID to support wound healing Continue MVI with minerals daily Add 250 mg of vitamin c daily to support wound healing Discontinue prosource plus and glucerna, add Ensure Max po BID, each supplement provides 150 kcal and 30 grams of protein.   NUTRITION DIAGNOSIS:  Increased nutrient needs related to wound healing as evidenced by estimated needs.  GOAL:  Patient will meet greater than or equal to 90% of their needs  MONITOR:  PO intake, Supplement acceptance, Labs, Weight trends, Skin, I & O's  REASON FOR ASSESSMENT:  Consult Wound healing  ASSESSMENT:  50 y.o. male with hx of DM with neuropathy, presented to ED with right foot pain after stepping on a screw several days prior. Redness worsening to the foot and toe with drainage on admission  Podiatry evaluated wound and imaging suggestive of necrotizing soft tissue infection of the second digit. Pt taken for I&D 8/22 with plans to go back to OR for further intervention. Despite I&D, second and third toes with poor healing due to ischemia and pt taken back to OR 8/26 for partial ray amputation of second and third toes.  Podiatry plans to take pt back to OR today for repeat I&D of wound and to place wound vac. ID following due to MSSA and GBS growing in wound cultures. Pt currently NPO in anticipation for surgery this afternoon.  Attempted to call pt on room phone to determine supplement acceptance since last assessment, no answer at this time. Messaged RN to determine if pt has been consuming, awaiting response. Intake appears to be good since last assessment, weight trending up.   Average Meal Intake: 8/24-8/30: 78% intake x 9 recorded meals (50-100%)  Nutritionally Relevant Medications: Scheduled Meds:  (feeding supplement) PROSource Plus  30 mL Oral TID BM    feeding supplement (GLUCERNA SHAKE)  237 mL Oral TID BM   insulin aspart  0-15 Units Subcutaneous Q4H   insulin glargine-yfgn  5 Units Subcutaneous Daily   multivitamin with minerals  1 tablet Oral Daily   nutrition supplement (JUVEN)  1 packet Oral BID BM   Continuous Infusions:   ceFAZolin (ANCEF) IV 2 g (11/09/20 0427)   PRN Meds: ondansetron, promethazine  Labs Reviewed: SBG ranges from 179-241 mg/dL over the last 24 hours HgbA1c 11.8% (8/22)  NUTRITION - FOCUSED PHYSICAL EXAM: Flowsheet Row Most Recent Value  Orbital Region No depletion  Upper Arm Region No depletion  Thoracic and Lumbar Region No depletion  Buccal Region No depletion  Temple Region No depletion  Clavicle Bone Region No depletion  Clavicle and Acromion Bone Region No depletion  Scapular Bone Region No depletion  Dorsal Hand No depletion  Patellar Region No depletion  Anterior Thigh Region No depletion  Posterior Calf Region No depletion  Edema (RD Assessment) Mild  [RLE]  Hair Reviewed  Eyes Reviewed  Mouth Reviewed  Skin Reviewed  Nails Reviewed   Diet Order:   Diet Order             Diet NPO time specified  Diet effective ____                  EDUCATION NEEDS:  Education needs have been addressed  Skin:  Skin Assessment: Skin Integrity Issues: Skin Integrity Issues:: Incisions Incisions: R foot  Last BM:  8/29 - type 5  Height:  Ht Readings from  Last 1 Encounters:  11/05/20 '5\' 7"'$  (1.702 m)    Weight:  Wt Readings from Last 1 Encounters:  11/08/20 96 kg   Ideal Body Weight:  67.3 kg  BMI:  Body mass index is 33.14 kg/m.  Estimated Nutritional Needs:  Kcal:  2050-2250 Protein:  105-120 grams Fluid:  >2 L   Ranell Patrick, RD, LDN Clinical Dietitian Pager on [facility]

## 2020-11-09 NOTE — Anesthesia Preprocedure Evaluation (Addendum)
Anesthesia Evaluation  Patient identified by MRN, date of birth, ID band Patient awake    Reviewed: Allergy & Precautions, NPO status , Patient's Chart, lab work & pertinent test results  History of Anesthesia Complications Negative for: history of anesthetic complications  Airway Mallampati: II  TM Distance: >3 FB Neck ROM: Full    Dental  (+) Edentulous Upper, Edentulous Lower   Pulmonary sleep apnea (noncompliant with CPAP) , neg COPD,    breath sounds clear to auscultation- rhonchi (-) wheezing      Cardiovascular (-) hypertension(-) CAD, (-) Past MI, (-) Cardiac Stents and (-) CABG negative cardio ROS   Rhythm:Regular Rate:Normal - Systolic murmurs and - Diastolic murmurs    Neuro/Psych neg Seizures negative neurological ROS  negative psych ROS   GI/Hepatic negative GI ROS, Neg liver ROS,   Endo/Other  diabetes, Poorly Controlled, Type 2, Insulin Dependent  Renal/GU negative Renal ROS     Musculoskeletal negative musculoskeletal ROS (+)   Abdominal (+) - obese,   Peds  Hematology negative hematology ROS (+) anemia ,   Anesthesia Other Findings 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, I&D of right foot & bone biopsy second metatarsal of right foot on 8/27 as per podiatry  Past Medical History: No date: Diabetes mellitus without complication (HCC) No date: Neuropathy No date: Sarcoidosis No date: Sleep apnea   Reproductive/Obstetrics                            Anesthesia Physical  Anesthesia Plan  ASA: 3  Anesthesia Plan: General   Post-op Pain Management:    Induction: Intravenous  PONV Risk Score and Plan: 2 and Propofol infusion, TIVA, Midazolam, Ondansetron and Dexamethasone  Airway Management Planned: Natural Airway  Additional Equipment: None  Intra-op Plan:   Post-operative Plan:   Informed Consent: I  have reviewed the patients History and Physical, chart, labs and discussed the procedure including the risks, benefits and alternatives for the proposed anesthesia with the patient or authorized representative who has indicated his/her understanding and acceptance.     Dental advisory given  Plan Discussed with: CRNA and Surgeon  Anesthesia Plan Comments: (Discussed risks of anesthesia with patient, including possibility of difficulty with spontaneous ventilation under anesthesia necessitating airway intervention, PONV, and rare risks such as cardiac or respiratory or neurological events, and allergic reactions. Patient understands.)      Anesthesia Quick Evaluation

## 2020-11-10 ENCOUNTER — Inpatient Hospital Stay: Payer: Self-pay

## 2020-11-10 ENCOUNTER — Encounter: Payer: Self-pay | Admitting: Podiatry

## 2020-11-10 DIAGNOSIS — M8610 Other acute osteomyelitis, unspecified site: Secondary | ICD-10-CM

## 2020-11-10 DIAGNOSIS — D72829 Elevated white blood cell count, unspecified: Secondary | ICD-10-CM

## 2020-11-10 DIAGNOSIS — D638 Anemia in other chronic diseases classified elsewhere: Secondary | ICD-10-CM

## 2020-11-10 DIAGNOSIS — D649 Anemia, unspecified: Secondary | ICD-10-CM

## 2020-11-10 LAB — BASIC METABOLIC PANEL
Anion gap: 4 — ABNORMAL LOW (ref 5–15)
BUN: 12 mg/dL (ref 6–20)
CO2: 33 mmol/L — ABNORMAL HIGH (ref 22–32)
Calcium: 7.7 mg/dL — ABNORMAL LOW (ref 8.9–10.3)
Chloride: 98 mmol/L (ref 98–111)
Creatinine, Ser: 0.71 mg/dL (ref 0.61–1.24)
GFR, Estimated: >60 mL/min (ref >60)
Glucose, Bld: 186 mg/dL — ABNORMAL HIGH (ref 70–99)
Potassium: 4.4 mmol/L (ref 3.5–5.1)
Sodium: 135 mmol/L (ref 135–145)

## 2020-11-10 LAB — CBC
HCT: 26.7 % — ABNORMAL LOW (ref 39.0–52.0)
Hemoglobin: 8.4 g/dL — ABNORMAL LOW (ref 13.0–17.0)
MCH: 30.2 pg (ref 26.0–34.0)
MCHC: 31.5 g/dL (ref 30.0–36.0)
MCV: 96 fL (ref 80.0–100.0)
Platelets: 370 10*3/uL (ref 150–400)
RBC: 2.78 MIL/uL — ABNORMAL LOW (ref 4.22–5.81)
RDW: 12.1 % (ref 11.5–15.5)
WBC: 13.3 10*3/uL — ABNORMAL HIGH (ref 4.0–10.5)
nRBC: 0.5 % — ABNORMAL HIGH (ref 0.0–0.2)

## 2020-11-10 LAB — GLUCOSE, CAPILLARY
Glucose-Capillary: 171 mg/dL — ABNORMAL HIGH (ref 70–99)
Glucose-Capillary: 171 mg/dL — ABNORMAL HIGH (ref 70–99)
Glucose-Capillary: 184 mg/dL — ABNORMAL HIGH (ref 70–99)
Glucose-Capillary: 186 mg/dL — ABNORMAL HIGH (ref 70–99)
Glucose-Capillary: 192 mg/dL — ABNORMAL HIGH (ref 70–99)
Glucose-Capillary: 202 mg/dL — ABNORMAL HIGH (ref 70–99)
Glucose-Capillary: 211 mg/dL — ABNORMAL HIGH (ref 70–99)
Glucose-Capillary: 226 mg/dL — ABNORMAL HIGH (ref 70–99)

## 2020-11-10 MED ORDER — INSULIN ASPART 100 UNIT/ML IJ SOLN
3.0000 [IU] | Freq: Three times a day (TID) | INTRAMUSCULAR | Status: DC
  Administered 2020-11-10 – 2020-11-11 (×3): 3 [IU] via SUBCUTANEOUS
  Filled 2020-11-10 (×3): qty 1

## 2020-11-10 MED ORDER — CHLORHEXIDINE GLUCONATE CLOTH 2 % EX PADS
6.0000 | MEDICATED_PAD | Freq: Every day | CUTANEOUS | Status: DC
  Administered 2020-11-10 – 2020-11-16 (×7): 6 via TOPICAL

## 2020-11-10 MED ORDER — INSULIN ASPART 100 UNIT/ML IJ SOLN
0.0000 [IU] | Freq: Three times a day (TID) | INTRAMUSCULAR | Status: DC
  Administered 2020-11-10: 5 [IU] via SUBCUTANEOUS
  Administered 2020-11-11 – 2020-11-12 (×4): 3 [IU] via SUBCUTANEOUS
  Administered 2020-11-12: 5 [IU] via SUBCUTANEOUS
  Administered 2020-11-12: 8 [IU] via SUBCUTANEOUS
  Administered 2020-11-13 – 2020-11-14 (×5): 5 [IU] via SUBCUTANEOUS
  Administered 2020-11-14: 3 [IU] via SUBCUTANEOUS
  Administered 2020-11-15: 2 [IU] via SUBCUTANEOUS
  Administered 2020-11-15: 8 [IU] via SUBCUTANEOUS
  Administered 2020-11-15: 5 [IU] via SUBCUTANEOUS
  Administered 2020-11-16 (×2): 8 [IU] via SUBCUTANEOUS
  Filled 2020-11-10 (×18): qty 1

## 2020-11-10 MED ORDER — SODIUM CHLORIDE 0.9% FLUSH: 10.0000 mL | INTRAVENOUS | Status: DC | PRN

## 2020-11-10 MED ORDER — SODIUM CHLORIDE 0.9% FLUSH
10.0000 mL | Freq: Two times a day (BID) | INTRAVENOUS | Status: DC
  Administered 2020-11-10 – 2020-11-15 (×9): 10 mL

## 2020-11-10 NOTE — Progress Notes (Signed)
PHARMACY CONSULT NOTE FOR:  OUTPATIENT  PARENTERAL ANTIBIOTIC THERAPY (OPAT)  Indication: MSSA and GBS diabetic foot infection and osteomyelitis Regimen: cefazolin 2gm IV q8h End date: 12/12/2020  IV antibiotic discharge orders are pended. To discharging provider:  please sign these orders via discharge navigator,  Select New Orders & click on the button choice - Manage This Unsigned Work.     Thank you for allowing pharmacy to be a part of this patient's care.  Doreene Eland, PharmD, BCPS.   Work Cell: [phone] 11/10/2020 10:32 AM

## 2020-11-10 NOTE — Anesthesia Postprocedure Evaluation (Signed)
Anesthesia Post Note  Patient: Fernando Vega  Procedure(s) Performed: IRRIGATION AND DEBRIDEMENT FOOT (Right) APPLICATION OF WOUND VAC (Right)  Patient location during evaluation: PACU Anesthesia Type: General Level of consciousness: awake and alert Pain management: pain level controlled Vital Signs Assessment: post-procedure vital signs reviewed and stable Respiratory status: spontaneous breathing, nonlabored ventilation, respiratory function stable and patient connected to nasal cannula oxygen Cardiovascular status: blood pressure returned to baseline and stable Postop Assessment: no apparent nausea or vomiting Anesthetic complications: no   No notable events documented.   Last Vitals:  Vitals:   11/09/20 1914 11/09/20 2009  BP: (!) 155/85 133/75  Pulse: 93 98  Resp: 13 17  Temp:  36.8 C  SpO2: 95% 92%    Last Pain:  Vitals:   11/09/20 2228  TempSrc:   PainSc: 7                  Arita Miss

## 2020-11-10 NOTE — Progress Notes (Signed)
Peripherally Inserted Central Catheter Placement  The IV Nurse has discussed with the patient and/or persons authorized to consent for the patient, the purpose of this procedure and the potential benefits and risks involved with this procedure.  The benefits include less needle sticks, lab draws from the catheter, and the patient may be discharged home with the catheter. Risks include, but not limited to, infection, bleeding, blood clot (thrombus formation), and puncture of an artery; nerve damage and irregular heartbeat and possibility to perform a PICC exchange if needed/ordered by physician.  Alternatives to this procedure were also discussed.  Bard Power PICC patient education guide, fact sheet on infection prevention and patient information card has been provided to patient /or left at bedside.    PICC Placement Documentation  PICC Single Lumen [id number] Right Basilic 37 cm 0 cm (Active)  Indication for Insertion or Continuance of Line Prolonged intravenous therapies 11/10/20 1501  Exposed Catheter (cm) 0 cm 11/10/20 1501  Site Assessment Clean;Dry;Intact 11/10/20 1501  Line Status Flushed;Saline locked;Occluded;Blood return noted 11/10/20 1501  Dressing Type Transparent 11/10/20 1501  Dressing Status Clean;Dry;Intact 11/10/20 1501  Antimicrobial disc in place? Yes 11/10/20 1501  Dressing Intervention New dressing;Other (Comment) 11/10/20 1501  Dressing Change Due 11/17/20 11/10/20 1501       Christella Noa Albarece 11/10/2020, 3:03 PM

## 2020-11-10 NOTE — Plan of Care (Signed)

## 2020-11-10 NOTE — Progress Notes (Signed)
Patient ID: Fernando Vega, male   DOB: [birth year], 50 y.o.   MRN: [id number] [facility] PROGRESS NOTE  BRITTNEY REMUND [id number] DOB: 1970-04-10 DOA: 11/01/2020 PCP: Patient, No Pcp Per (Inactive)  HPI/Subjective: Patient feels a little pain in his foot.  Patient initially was resistant to home health but was agreeable because we have to set up IV antibiotics and a wound VAC.  Objective: Vitals:   11/10/20 0234 11/10/20 0737  BP: 108/69 112/70  Pulse: 81 90  Resp: 17 15  Temp: 97.9 F (36.6 C) 99.3 F (37.4 C)  SpO2: 96% 96%    Intake/Output Summary (Last 24 hours) at 11/10/2020 1537 Last data filed at 11/09/2020 1842 Gross per 24 hour  Intake 700 ml  Output --  Net 700 ml   Filed Weights   11/06/20 0333 11/07/20 0559 11/08/20 0437  Weight: 95.4 kg 98.5 kg 96 kg    ROS: Review of Systems  Respiratory:  Negative for shortness of breath.   Cardiovascular:  Negative for chest pain.  Gastrointestinal:  Negative for abdominal pain, nausea and vomiting.  Musculoskeletal:  Positive for joint pain.  Exam: Physical Exam HENT:     Head: Normocephalic.     Mouth/Throat:     Pharynx: No oropharyngeal exudate.  Eyes:     General: Lids are normal.     Conjunctiva/sclera: Conjunctivae normal.  Cardiovascular:     Rate and Rhythm: Normal rate and regular rhythm.     Heart sounds: Normal heart sounds, S1 normal and S2 normal.  Pulmonary:     Breath sounds: No decreased breath sounds, wheezing, rhonchi or rales.  Abdominal:     Palpations: Abdomen is soft.     Tenderness: There is no abdominal tenderness.  Musculoskeletal:     Right lower leg: No swelling.     Left lower leg: No swelling.     Comments: Right foot covered.  Skin:    General: Skin is warm.     Findings: No rash.     Comments: Right foot covered.  Neurological:     Mental Status: He is alert and oriented to person, place, and time.      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  Patient is status post amputation.  Patient went back to the OR and had wound VAC placed.  PICC line ordered.  Will need total of 6 weeks of IV Ancef. Type 2 diabetes mellitus with neuropathy, poorly controlled with elevated hemoglobin A1c at 11.5.  Glargine insulin.  Short acting insulin prior to meals. Leukocytosis secondary to infection Anemia.  Likely blood loss with surgery's hemoglobin up to 8.4.  We will check again tomorrow morning.  We will send off iron studies        Code Status:     Code Status Orders  (From admission, onward)  Start     Ordered   11/01/20 1143  Full code  Continuous        11/01/20 1144           Code Status History     This patient has a current code status but no historical code status.      Family Communication: Spoke with wife on the phone Disposition Plan: Status is: Inpatient  Dispo: The patient is from: Home              Anticipated d/c is to: Home with home health              Patient currently needs to be set up with home health to help out with IV antibiotics and wound VAC.  All this will need to be set up prior to disposition.   Difficult to place patient.  No.  Consultants: Podiatry Infectious disease  Procedures: 2 surgeries on the right foot  Antibiotics: IV Ancef  Time spent: 28 minutes  Omer

## 2020-11-10 NOTE — Evaluation (Signed)
Physical Therapy Evaluation Patient Details Name: Fernando Vega MRN: [id number] DOB: [birth year] Today's Date: 11/10/2020   History of Present Illness  50 y.o. male with medical history significant for diabetes mellitus with complications of peripheral neuropathy who presents to the emergency room for evaluation of a foot injury which happened about 3 days prior to his presentation.  Patient states that he was at a junk yard and probably stepped on a screw that went through his shoes. 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  Clinical Impression  Pt initially not wanting to do a lot, but ultimately understood that he needed to get up, get moving now that we have the CAM boot and to insure he knows how to appropriately manage mobility and stairs with new precautions.  Pt was able to ambulate ~100 ft and negotiate steps with relatively good confidence and safety.  Pt will need RW and assures me he understands the importance of using the CAM walker any time he is going to be up/WBing.    Follow Up Recommendations Supervision - Intermittent;Follow surgeon's recommendation for DC plan and follow-up therapies (apparently initially resistant to even home RN)    Equipment Recommendations  Rolling walker with 5" wheels    Recommendations for Other Services       Precautions / Restrictions Precautions Precautions: Fall Restrictions Weight Bearing Restrictions: Yes RLE Weight Bearing: Weight bearing as tolerated (with CAM walker)      Mobility  Bed Mobility Overal bed mobility: Modified Independent             General bed mobility comments: Pt needing to use momentum and heavy UEs but did not need assist    Transfers Overall transfer level: Modified independent Equipment used: Rolling walker (2 wheeled)             General transfer comment: Pt able to rise w/o assist, RW to assist with weight-bearing distribution (CAM  walker)  Ambulation/Gait Ambulation/Gait assistance: Supervision Gait Distance (Feet): 100 Feet Assistive device: Rolling walker (2 wheeled) (CAM walker)       General Gait Details: Pt with forward lean on walker, initially with some hesitancy but ultimately did not have any balance issues and generally did well.  He was able to take a few NWBing hopping steps but clearly better and safer with CAM boot WBAT ambulation.  Stairs Stairs: Yes Stairs assistance: Min guard Stair Management: No rails Number of Stairs: 4 General stair comments: Pt able to negotiate up/down steps w/o rails using backward strategy and apart from light CGA with walker stabilization did not need direct physical assist.  Pt with good understanding  Wheelchair Mobility    Modified Rankin (Stroke Patients Only)       Balance Overall balance assessment: Modified Independent (reliant on UEs)                                           Pertinent Vitals/Pain Pain Assessment: No/denies pain (Pt has no feeling in LEs er go no pain)    Home Living Family/patient expects to be discharged to:: Private residence Living Arrangements: Spouse/significant other Available Help at Discharge: Family   Home Access: Stairs to enter Entrance Stairs-Rails: None Entrance Stairs-Number of Steps: 5 Home Layout: One level Home Equipment: None      Prior Function Level of Independence: Independent  Comments: Pt reports that normally he is able to drive and be active, when he works it is typically minimally active     Hand Dominance        Extremity/Trunk Assessment   Upper Extremity Assessment Upper Extremity Assessment: Generalized weakness;Overall [facility] for tasks assessed    Lower Extremity Assessment Lower Extremity Assessment: Generalized weakness;Overall WFL for tasks assessed       Communication   Communication: No difficulties  Cognition Arousal/Alertness:  Awake/alert Behavior During Therapy: WFL for tasks assessed/performed Overall Cognitive Status: Within Functional Limits for tasks assessed                                        General Comments      Exercises     Assessment/Plan    PT Assessment Patient needs continued PT services  PT Problem List Decreased strength;Decreased range of motion;Decreased activity tolerance;Decreased balance;Decreased mobility;Decreased knowledge of use of DME;Decreased safety awareness       PT Treatment Interventions DME instruction;Gait training;Stair training;Functional mobility training;Therapeutic activities;Therapeutic exercise;Balance training;Cognitive remediation    PT Goals (Current goals can be found in the Care Plan section)  Acute Rehab PT Goals Patient Stated Goal: Go home PT Goal Formulation: With patient Time For Goal Achievement: 11/24/20 Potential to Achieve Goals: Fair    Frequency Min 2X/week   Barriers to discharge        Co-evaluation               AM-PAC PT "6 Clicks" Mobility  Outcome Measure Help needed turning from your back to your side while in a flat bed without using bedrails?: None Help needed moving from lying on your back to sitting on the side of a flat bed without using bedrails?: None Help needed moving to and from a bed to a chair (including a wheelchair)?: None Help needed standing up from a chair using your arms (e.g., wheelchair or bedside chair)?: None Help needed to walk in hospital room?: A Little Help needed climbing 3-5 steps with a railing? : A Little 6 Click Score: 22    End of Session Equipment Utilized During Treatment: Gait belt Activity Tolerance: Patient tolerated treatment well Patient left: in bed;with call bell/phone within reach (getting PICC line) Nurse Communication: Mobility status PT Visit Diagnosis: Muscle weakness (generalized) (M62.81);Difficulty in walking, not elsewhere classified  (R26.2);Unsteadiness on feet (R26.81)    Time: [id number] PT Time Calculation (min) (ACUTE ONLY): 28 min   Charges:   PT Evaluation $PT Eval Low Complexity: 1 Low PT Treatments $Gait Training: 8-22 mins        Kreg Shropshire, DPT 11/10/2020, 2:47 PM

## 2020-11-10 NOTE — Progress Notes (Signed)
   Date of Admission:  11/01/2020      ID: TAJIRI SEKEL is a 50 y.o. male Principal Problem:   Necrotizing soft tissue infection Active Problems:   Puncture wound   Type 2 diabetes mellitus with peripheral neuropathy (HCC)   Diabetic foot infection ([place])   Leukocytosis   Anemia    Subjective: Doing fine  Wife at bed side No pain  Medications:   vitamin C  250 mg Oral Daily   Chlorhexidine Gluconate Cloth  6 each Topical Daily   enoxaparin (LOVENOX) injection  40 mg Subcutaneous Q24H   insulin aspart  0-15 Units Subcutaneous TID WC   insulin aspart  3 Units Subcutaneous TID WC   insulin glargine-yfgn  5 Units Subcutaneous Daily   multivitamin with minerals  1 tablet Oral Daily   nutrition supplement (JUVEN)  1 packet Oral BID BM   Ensure Max Protein  11 oz Oral BID   sodium chloride flush  10-40 mL Intracatheter Q12H    Objective: Vital signs in last 24 hours: Temp:  [97.5 F (36.4 C)-99.3 F (37.4 C)] 98.3 F (36.8 C) (08/31 1652) Pulse Rate:  [81-98] 94 (08/31 1652) Resp:  [13-21] 16 (08/31 1652) BP: (108-155)/(67-86) 145/86 (08/31 1652) SpO2:  [92 %-97 %] 97 % (08/31 1652)  PHYSICAL EXAM:  General: Alert, cooperative, no distress, appears stated age.  Head: Normocephalic, without obvious abnormality, atraumatic. Eyes: Conjunctivae clear, anicteric sclerae. Pupils are equal ENT Nares normal. No drainage or sinus tenderness. Lips, mucosa, and tongue normal. No Thrush Neck: Supple, symmetrical, no adenopathy, thyroid: non tender no carotid bruit and no JVD. Back: No CVA tenderness. Lungs: Clear to auscultation bilaterally. No Wheezing or Rhonchi. No rales. Heart: Regular rate and rhythm, no murmur, rub or gallop. Abdomen: Soft, non-tender,not distended. Bowel sounds normal. No masses Extremities: rt foot surgical dressing- wound vac Rt PICC Skin: No rashes or lesions. Or bruising Lymph: Cervical, supraclavicular normal. Neurologic: Grossly non-focal  Lab  Results Recent Labs    11/09/20 0446 11/10/20 0448  WBC 11.7* 13.3*  HGB 8.7* 8.4*  HCT 27.4* 26.7*  NA 135 135  K 3.7 4.4  CL 97* 98  CO2 31 33*  BUN 15 12  CREATININE 0.65 0.71    Assessment/Plan: Diabetic foot infection rt side-s/p amputation of 2nd /3rd toe for ischemia Prior to that he had debridement of the wound and drainage of abscess MSSa/GBS in culture On cefazolin Because of acute osteomyelitis will need IV cefazolin for 4-6 weeks- 12/12/20  DM on insulin  Discussed the management with patient and his wife Will follow him as OP

## 2020-11-10 NOTE — TOC Progression Note (Addendum)
Transition of Care [facility]) - Progression Note    Patient Details  Name: Fernando Vega MRN: [id number] Date of Birth: September 09, 1970  [facility] CM/SW Contact  Su Hilt, RN Phone Number: 11/10/2020, 4:33 PM  Clinical Narrative:     Damaris Schooner with [place] [facility] he requested the [facility] orders be faxed to  [phone]    I requested the orders from the physician, [place] stated that [place] will accept the patient for Nursing with LOG   LOG to [facility], Faxed the [facility] order to Blanca Friend [phone] Sent   Expected Discharge Plan and Services                                                 Social Determinants of Health (SDOH) Interventions    Readmission Risk Interventions No flowsheet data found.

## 2020-11-10 NOTE — Progress Notes (Signed)
  Subjective:  Patient ID: Fernando Vega, male    DOB: Jun 22, 1970,  MRN: [id number]  Overall doing okay he is frustrated the prospect of having to have a wound VAC attached to him for several weeks but says it does not hurt  Negative for chest pain and shortness of breath Chest pain: no Shortness of breath: no Fever: no Night sweats: no Constitutional signs: no Objective:   Vitals:   11/10/20 1624 11/10/20 1652  BP: 130/67 (!) 145/86  Pulse: 89 94  Resp: 15 16  Temp: 98 F (36.7 C) 98.3 F (36.8 C)  SpO2: 96% 97%   General AA&O x3. Normal mood and affect.  Vascular Dorsalis pedis and posterior tibial pulses 2/4 bilat cyanotic second and third toes with poor cap refill  Neurologic Epicritic sensation grossly intact.  Dermatologic Wound VAC with good suction and no leaks  Orthopedic: MMT 5/5       Assessment & Plan:  Patient was evaluated and treated and all questions answered.  Right foot diabetic foot infection -He is understandably frustrated with what is happened to him and the wound VAC.  I explained the rationale and reasoning for using a wound VAC and how this will expedite his wound healing without amputating any further parts of the foot.  He is agreeable to using it. -Will need to be changed 3 times weekly home nursing may be difficult to place due to him being not insured. -He did say he has a sister that lives in [place] that is willing to do it.  Additionally his wife may be able to do it but would need training on it -Would hope to exhaust all home nursing options before having his family do it -Once his discharge needs are in place he is good to go from our standpoint and will follow up with Dr. Amalia Hailey or myself in our [place] office  Criselda Peaches, DPM  Accessible via secure chat for questions or concerns.

## 2020-11-10 NOTE — Treatment Plan (Signed)
Diagnosis: Diabetic foot infection with osteomyelitis Baseline Creatinine <1  Culture Result: MSSA and GBS  No Known Allergies  OPAT Orders Discharge antibiotics: Cefazolin 2 grams IV every 8 hours until  Duration: 6 weeks total End Date: 12/12/20  [facility] Per Protocol:place biopatch  Labs weekly while on IV antibiotics: _X_ CBC with differential __ BMP __X CMP _X_ CRP _X_ ESR   _X_ Please pull PIC at completion of IV antibiotics _  Fax weekly labs to [phone]  Clinic Follow Up Appt:12/07/20 at 12 noon   Call [phone] with any questions

## 2020-11-10 NOTE — TOC Progression Note (Addendum)
Transition of Care [facility]) - Progression Note    Patient Details  Name: Fernando Vega MRN: [id number] Date of Birth: [birth year]  Transition of Care [facility]) CM/SW [place], RN Phone Number: 11/10/2020, 9:05 AM  Clinical Narrative:   Damaris Schooner with [name] at [facility] for the wound vac,  The patient will need IV ABX, I contacted [facility] to set up, He will need [place] for the Wound vac and for PICC line care and Blood draws, I explained the need for Home health to the patient, he only agrees to nursing I notified [facility] that has the [facility] rotation this week. Joelene Millin stated that they will not be able to accept the patient due to staffing,  [facility] will not go to [facility] where the patient lives I explained the cost of SNF to the patient without insurance, he stated that he would not be able to afford it.  He will need charity Home health set up or an [place]. Will continue to try and set up [facility] for nursing, [name] with [facility] will send the form for patient's without insurance to be filled out  [facility]tacted [facility] and requested them to accept [place] for Nursing   [facility] will not be able to accept the patient for Home health, I contacted Tommi Rumps at [place] and am awaiting a call back   Contacted [place] at [facility], she will check to see if they can accept and call me back   [place] stated they are unable to accept the patient  [facility] and requested them to take the patient for nursing with [place] health and requested them to accept the patient with [place] home health does not go to [facility] and asked if they could accept the patient, they stated that they do not do LOGs  [facility] has stated they can not take the patient at this time [facility] has stated they can not take the patient for nursing [facility] not go to [place]  with [place] and he will let me know if they can take the patient with LOG in [facility] home health and they confirmed they do go to [facility],  But he is not sure if they can take the patient, he will have someone call me I faxed the referral to [phone]  Expected Discharge Plan and Services                                                 Social Determinants of Health (SDOH) Interventions    Readmission Risk Interventions No flowsheet data found.

## 2020-11-11 DIAGNOSIS — D638 Anemia in other chronic diseases classified elsewhere: Secondary | ICD-10-CM

## 2020-11-11 LAB — IRON AND TIBC
Iron: 31 ug/dL — ABNORMAL LOW (ref 45–182)
Saturation Ratios: 17 % — ABNORMAL LOW (ref 17.9–39.5)
TIBC: 179 ug/dL — ABNORMAL LOW (ref 250–450)
UIBC: 148 ug/dL

## 2020-11-11 LAB — GLUCOSE, CAPILLARY
Glucose-Capillary: 196 mg/dL — ABNORMAL HIGH (ref 70–99)
Glucose-Capillary: 180 mg/dL — ABNORMAL HIGH (ref 70–99)
Glucose-Capillary: 156 mg/dL — ABNORMAL HIGH (ref 70–99)
Glucose-Capillary: 186 mg/dL — ABNORMAL HIGH (ref 70–99)

## 2020-11-11 LAB — CBC
HCT: 26 % — ABNORMAL LOW (ref 39.0–52.0)
Hemoglobin: 8.4 g/dL — ABNORMAL LOW (ref 13.0–17.0)
MCH: 31.2 pg (ref 26.0–34.0)
MCHC: 32.3 g/dL (ref 30.0–36.0)
MCV: 96.7 fL (ref 80.0–100.0)
Platelets: 382 10*3/uL (ref 150–400)
RBC: 2.69 MIL/uL — ABNORMAL LOW (ref 4.22–5.81)
RDW: 12.4 % (ref 11.5–15.5)
WBC: 13.2 10*3/uL — ABNORMAL HIGH (ref 4.0–10.5)
nRBC: 0.5 % — ABNORMAL HIGH (ref 0.0–0.2)

## 2020-11-11 LAB — FERRITIN: Ferritin: 209 ng/mL (ref 24–336)

## 2020-11-11 MED ORDER — INSULIN ASPART 100 UNIT/ML IJ SOLN
5.0000 [IU] | Freq: Three times a day (TID) | INTRAMUSCULAR | Status: DC
  Administered 2020-11-11 – 2020-11-16 (×14): 5 [IU] via SUBCUTANEOUS
  Filled 2020-11-11 (×15): qty 1

## 2020-11-11 NOTE — TOC Progression Note (Addendum)
Transition of Care [facility]) - Progression Note    Patient Details  Name: Fernando Vega MRN: [id number] Date of Birth: Oct 19, 1970  Transition of Care [facility]) CM/SW Contact  Su Hilt, RN Phone Number: 11/11/2020, 8:48 AM  Clinical Narrative:    [facility] called back and stated that they can not accept the patient I faxed the Referral to [facility] again to [phone] and called to request they review and call me back.     Update, I received a call from [facility] infusion and they stated that they received the referral from their home health department, I explained that the referral is for home health nursing and not the infusion , she stated that she would send the referral back to their home health department and she would call them and explain it is for nursing,   Update [facility] asked their corporate one more time to accept the patient, they are not able to accept, I called [facility] to follow up since not hearing  back from them, spoke with [name], he verified that they did receive the fax, he stated that he would call the nursing department and see if they are going to be able to accept the patient and would have someone call me back. I explained that we hope to DC tomorrow, awaiting a call back      Expected Discharge Plan and Services                                                 Social Determinants of Health (SDOH) Interventions    Readmission Risk Interventions No flowsheet data found.

## 2020-11-11 NOTE — Plan of Care (Signed)

## 2020-11-11 NOTE — Progress Notes (Signed)
Patient ID: Fernando Vega, male   DOB: 04-16-1970, 50 y.o.   MRN: [id number] [facility] PROGRESS NOTE  Fernando Vega [id number] DOB: 10-01-1970 DOA: 11/01/2020 PCP: Patient, No Pcp Per (Inactive)  HPI/Subjective: Patient feels okay.  Able to walk around with the boot yesterday.  He does have some pain in his foot.  PICC line placed.      Objective: Vitals:   11/11/20 0232 11/11/20 0816  BP: 124/74 126/74  Pulse: 84 94  Resp: 16 15  Temp: 98.2 F (36.8 C) 98.2 F (36.8 C)  SpO2: 94% 92%    Intake/Output Summary (Last 24 hours) at 11/11/2020 1544 Last data filed at 11/11/2020 1023 Gross per 24 hour  Intake 2050 ml  Output --  Net 2050 ml   Filed Weights   11/07/20 0559 11/08/20 0437 11/11/20 0500  Weight: 98.5 kg 96 kg 100.3 kg    ROS: Review of Systems  Respiratory:  Negative for shortness of breath.   Cardiovascular:  Negative for chest pain.  Gastrointestinal:  Negative for abdominal pain, nausea and vomiting.  Musculoskeletal:  Positive for joint pain.  Exam: Physical Exam HENT:     Head: Normocephalic.     Mouth/Throat:     Pharynx: No oropharyngeal exudate.  Eyes:     General: Lids are normal.     Conjunctiva/sclera: Conjunctivae normal.  Cardiovascular:     Rate and Rhythm: Normal rate and regular rhythm.     Heart sounds: Normal heart sounds, S1 normal and S2 normal.  Pulmonary:     Breath sounds: Normal breath sounds. No decreased breath sounds, wheezing, rhonchi or rales.  Abdominal:     Palpations: Abdomen is soft.     Tenderness: There is no abdominal tenderness.     Hernia: A hernia is present. Hernia is present in the ventral area.  Musculoskeletal:     Right lower leg: No swelling.     Left lower leg: No swelling.  Skin:    Comments: Right foot wrapped.  Neurological:     Mental Status: He is alert and oriented to person, place, and time.      Scheduled Meds:  vitamin C  250 mg Oral Daily   Chlorhexidine Gluconate Cloth  6 each  Topical Daily   enoxaparin (LOVENOX) injection  40 mg Subcutaneous Q24H   insulin aspart  0-15 Units Subcutaneous TID WC   insulin aspart  5 Units Subcutaneous TID WC   insulin glargine-yfgn  5 Units Subcutaneous Daily   multivitamin with minerals  1 tablet Oral Daily   nutrition supplement (JUVEN)  1 packet Oral BID BM   Ensure Max Protein  11 oz Oral BID   sodium chloride flush  10-40 mL Intracatheter Q12H   Continuous Infusions:   ceFAZolin (ANCEF) IV 2 g (11/11/20 1341)    Assessment/Plan:  Necrotizing soft tissue infection of the right foot involving the right second toe.  Patient is status post amputation.  Patient went back to the OR for cleanout and wound VAC placement.  PICC line placed.  Will need a total of 6 weeks of IV Ancef.  Transitional care team still working on obtaining a home health nurse to help with PICC line care and wound VAC changing. Type 2 diabetes mellitus with neuropathy, poorly controlled with elevated hemoglobin A1c at 11.5.  Continue glargine insulin.  Continue short acting insulin prior to meals.  Once diabetes better controlled as outpatient hopefully can get over to oral medications but will be  discharged home on insulin. Leukocytosis secondary to infection.  Last white blood cell count 13.2.   Anemia of chronic disease.  Hemoglobin stable at 8.4.        Code Status:     Code Status Orders  (From admission, onward)           Start     Ordered   11/01/20 1143  Full code  Continuous        11/01/20 1144           Code Status History     This patient has a current code status but no historical code status.      Family Communication: Updated wife on phone Disposition Plan: Status is: Inpatient  Dispo: The patient is from: Home              Anticipated d/c is to: Home once home health RN set up              Patient currently transitional care team trying to obtain a home health RN to change wound VAC and to help with PICC line care.   If unable to obtain home health RN may have to change plan on the wound care upon discharge.   Difficult to place patient.  No.  Consultants: Podiatry Infectious disease  Procedures: Second toe amputation  Antibiotics: Ancef  Time spent: 26 minutes  Orly Quimby [employer]

## 2020-11-11 NOTE — Progress Notes (Signed)
Inpatient Diabetes Program Recommendations  AACE/ADA: New Consensus Statement on Inpatient Glycemic Control   Target Ranges:  Prepandial:   less than 140 mg/dL      Peak postprandial:   less than 180 mg/dL (1-2 hours)      Critically ill patients:  140 - 180 mg/dL   Results for KUYPER, COCKETT (MRN [id number]) as of 11/11/2020 09:47  Ref. Range 11/10/2020 08:01 11/10/2020 11:48 11/10/2020 17:03 11/10/2020 20:20 11/10/2020 23:47 11/11/2020 08:15  Glucose-Capillary Latest Ref Range: 70 - 99 mg/dL 226 (H) 186 (H) 202 (H) 192 (H) 184 (H) 196 (H)   Review of Glycemic Control  Diabetes history: DM2 Outpatient Diabetes medications: none Current orders for Inpatient glycemic control: Semglee 5 units daily, Novolog 3 units TID with meals, Novolog 0-15 units TID with meals  Inpatient Diabetes Program Recommendations:    Insulin: Please consider increasing meal coverage to Novolog 5 units TID with meals.  Thanks, Barnie Alderman, RN, MSN, CDE Diabetes Coordinator Inpatient Diabetes Program [phone] (Team Pager from 8am to 5pm)

## 2020-11-12 ENCOUNTER — Other Ambulatory Visit: Payer: Self-pay

## 2020-11-12 LAB — GLUCOSE, CAPILLARY
Glucose-Capillary: 194 mg/dL — ABNORMAL HIGH (ref 70–99)
Glucose-Capillary: 245 mg/dL — ABNORMAL HIGH (ref 70–99)
Glucose-Capillary: 210 mg/dL — ABNORMAL HIGH (ref 70–99)
Glucose-Capillary: 217 mg/dL — ABNORMAL HIGH (ref 70–99)

## 2020-11-12 MED ORDER — SODIUM CHLORIDE 0.9% FLUSH: 20.0000 mL | INTRAVENOUS | 1 refills | Status: DC | PRN

## 2020-11-12 MED ORDER — INSULIN PEN NEEDLE 32G X 6 MM MISC
1.0000 | Freq: Three times a day (TID) | 1 refills | Status: DC
  Filled 2020-11-12: qty 100, 25d supply, fill #0

## 2020-11-12 MED ORDER — ALBUTEROL SULFATE HFA 108 (90 BASE) MCG/ACT IN AERS
2.0000 | INHALATION_SPRAY | Freq: Four times a day (QID) | RESPIRATORY_TRACT | 0 refills | Status: DC | PRN
  Filled 2020-11-12: qty 6.7, 25d supply, fill #0

## 2020-11-12 MED ORDER — INSULIN LISPRO (1 UNIT DIAL) 100 UNIT/ML (KWIKPEN)
5.0000 [IU] | PEN_INJECTOR | Freq: Three times a day (TID) | SUBCUTANEOUS | 1 refills | Status: DC
  Filled 2020-11-12: qty 15, 100d supply, fill #0

## 2020-11-12 MED ORDER — CEFAZOLIN IV (FOR PTA / DISCHARGE USE ONLY): 2.0000 g | Freq: Three times a day (TID) | INTRAVENOUS | 0 refills | Status: DC

## 2020-11-12 MED ORDER — BASAGLAR KWIKPEN 100 UNIT/ML ~~LOC~~ SOPN
5.0000 [IU] | PEN_INJECTOR | Freq: Every day | SUBCUTANEOUS | 1 refills | Status: DC
  Filled 2020-11-12: qty 15, 140d supply, fill #0

## 2020-11-12 MED ORDER — ASCORBIC ACID 250 MG PO TABS
250.0000 mg | ORAL_TABLET | Freq: Every day | ORAL | 0 refills | Status: DC
  Filled 2020-11-12: qty 30, 30d supply, fill #0

## 2020-11-12 MED ORDER — ADULT MULTIVITAMIN W/MINERALS CH
1.0000 | ORAL_TABLET | Freq: Every day | ORAL | 0 refills | Status: AC
  Filled 2020-11-12: qty 30, 30d supply, fill #0

## 2020-11-12 MED ORDER — INSULIN LISPRO (1 UNIT DIAL) 100 UNIT/ML (KWIKPEN): 5.0000 [IU] | PEN_INJECTOR | Freq: Three times a day (TID) | SUBCUTANEOUS | 1 refills | Status: DC

## 2020-11-12 NOTE — TOC Progression Note (Addendum)
Transition of Care [facility]) - Progression Note    Patient Details  Name: Fernando Vega MRN: [id number] Date of Birth: 03-14-1970  Transition of Care [facility]) CM/SW [place], RN Phone Number: 11/12/2020, 1:55 PM  Clinical Narrative:    Volunteer services will pick up the patient's medications at [facility]     Medications delivered to the patient, [facility] let me know that they will not be able to meet the patient's needs due to staffing  I called   [facility] at [phone] left a voice mail for a call back  Expected Discharge Plan and Services                                                 Social Determinants of Health (SDOH) Interventions    Readmission Risk Interventions No flowsheet data found.

## 2020-11-12 NOTE — TOC Progression Note (Addendum)
Transition of Care [facility]) - Progression Note    Patient Details  Name: Fernando Vega MRN: [id number] Date of Birth: [birth year]  Transition of Care [facility]) CM/SW [place], RN Phone Number: 11/12/2020, 8:51 AM  Clinical Narrative:      [name], had not received a call back.  They have received the referral via fax.  I inquired about if they would be able to accept the patient for IV PICC line care and wound vac care.  Spoke with [name].  He stated that the nurses are reviewing and he will ask that they follow up with me.    [name] will [facility]  stated that they are waiting on the Escript to be signed by the doctor, I messaged the physician and requested that he sign, They are also awaiting getting home health se tup for the dressing change, they need wound measurements as well    [facility] called and stated they are at capacity and not taking outside patients   I called [name] with [facility] [phone] option 1, I explained how LOG would work, she stated the patient is 1 hour each way from their office but they may be able to do it 1 time a week if he could go into the Dr office the rest for the Wound Vac, At most they can do the PICC line Dressing change and blood draw, she has a meeting at 1045 and will discuss with upper mgt, and let me know, faxed the referral to [phone]   Expected Discharge Plan and Services                                                 Social Determinants of Health (SDOH) Interventions    Readmission Risk Interventions No flowsheet data found.

## 2020-11-12 NOTE — Progress Notes (Signed)
Patient ID: Fernando Vega, male   DOB: [birth year], 50 y.o.   MRN: [id number] [facility] PROGRESS NOTE  IVORY THORMAN [id number] DOB: Jul 20, 1970 DOA: 11/01/2020 PCP: Patient, No Pcp Per (Inactive)  HPI/Subjective: Patient feeling okay.  Does have a little pain in his foot.  No chest pain or shortness of breath.  Admitted with diabetic foot infection.  Objective: Vitals:   11/12/20 0700 11/12/20 1532  BP: (!) 145/77 (!) 152/78  Pulse: 89 89  Resp: 17 16  Temp: 98.8 F (37.1 C) 98 F (36.7 C)  SpO2: 95% 97%    Intake/Output Summary (Last 24 hours) at 11/12/2020 1646 Last data filed at 11/12/2020 1400 Gross per 24 hour  Intake 490 ml  Output --  Net 490 ml   Filed Weights   11/07/20 0559 11/08/20 0437 11/11/20 0500  Weight: 98.5 kg 96 kg 100.3 kg    ROS: Review of Systems  Respiratory:  Negative for shortness of breath.   Cardiovascular:  Negative for chest pain.  Gastrointestinal:  Negative for abdominal pain, nausea and vomiting.  Exam: Physical Exam HENT:     Head: Normocephalic.     Mouth/Throat:     Pharynx: No oropharyngeal exudate.  Eyes:     General: Lids are normal.     Conjunctiva/sclera: Conjunctivae normal.  Cardiovascular:     Rate and Rhythm: Normal rate and regular rhythm.     Heart sounds: Normal heart sounds, S1 normal and S2 normal.  Pulmonary:     Breath sounds: No decreased breath sounds, wheezing, rhonchi or rales.  Abdominal:     Palpations: Abdomen is soft.     Tenderness: There is no abdominal tenderness.  Musculoskeletal:     Right lower leg: No swelling.     Left lower leg: No swelling.  Skin:    General: Skin is warm.     Findings: No rash.     Comments: Foot covered.  Neurological:     Mental Status: He is alert and oriented to person, place, and time.      Scheduled Meds:  vitamin C  250 mg Oral Daily   Chlorhexidine Gluconate Cloth  6 each Topical Daily   enoxaparin (LOVENOX) injection  40 mg Subcutaneous Q24H    insulin aspart  0-15 Units Subcutaneous TID WC   insulin aspart  5 Units Subcutaneous TID WC   insulin glargine-yfgn  5 Units Subcutaneous Daily   multivitamin with minerals  1 tablet Oral Daily   nutrition supplement (JUVEN)  1 packet Oral BID BM   Ensure Max Protein  11 oz Oral BID   sodium chloride flush  10-40 mL Intracatheter Q12H   Continuous Infusions:   ceFAZolin (ANCEF) IV 2 g (11/12/20 1434)    Assessment/Plan:  Necrotizing soft tissue infection of the right foot involving the right second toe.  Patient had an amputation of the toe.  Patient had second OR trip for wound cleanout and wound VAC placement.  PICC line placed.  Patient will need a total of 6 weeks of IV Ancef.  Transitional care team having difficulty obtaining a home health nurse to help out with wound VAC changes and PICC line care.  Podiatry to reevaluate tonight. Type 2 diabetes mellitus with neuropathy.  Elevated hemoglobin A1c at 11.5.  On low-dose glargine insulin and short acting insulin prior to meals.  Hopefully in the future can get over to oral medications but upon discharge will prescribe insulin.  Medications obtained through medication management. Anemia  of chronic disease Leukocytosis        Code Status:     Code Status Orders  (From admission, onward)           Start     Ordered   11/01/20 1143  Full code  Continuous        11/01/20 1144           Code Status History     This patient has a current code status but no historical code status.      Family Communication: Spoke with wife on the phone Disposition Plan: Status is: Inpatient  Dispo: The patient is from: Home              Anticipated d/c is to: Home              Patient currently medically stable to go home but having difficulty obtaining home health services at home so I am nervous about disposition at this point.  Currently patient with a wound VAC and PICC line.   Difficult to place patient.   No.  Consultants: Podiatry Infectious disease  Antibiotics: Ancef  Time spent: 27 minutes  [place]

## 2020-11-12 NOTE — Progress Notes (Signed)
Physical Therapy Treatment Patient Details Name: Fernando Vega MRN: [id number] DOB: 10/05/1970 Today's Date: 11/12/2020    History of Present Illness 50 y.o. male with medical history significant for diabetes mellitus with complications of peripheral neuropathy who presents to the emergency room for evaluation of a foot injury which happened about 3 days prior to his presentation.  Patient states that he was at a junk yard and probably stepped on a screw that went through his shoes. 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    PT Comments    Pt seen this pm for continued PT services.  Pt agreed to OOB, vc's provided for proper donning of R CAM boot. Gait training with RW 21f with SBA for safety.  Pt appears functionally ready for d/c home once everything is set up.    Follow Up Recommendations  Supervision - Intermittent;Follow surgeon's recommendation for DC plan and follow-up therapies     Equipment Recommendations  Rolling walker with 5" wheels    Recommendations for Other Services       Precautions / Restrictions Precautions Precautions: Fall Restrictions Weight Bearing Restrictions: Yes RLE Weight Bearing: Weight bearing as tolerated (with CAM boot on)    Mobility  Bed Mobility Overal bed mobility: Modified Independent                  Transfers Overall transfer level: Modified independent Equipment used: Rolling walker (2 wheeled)             General transfer comment: Pt able to rise w/o assist, RW to assist with weight-bearing distribution  Ambulation/Gait Ambulation/Gait assistance: Supervision Gait Distance (Feet): 200 Feet Assistive device: Rolling walker (2 wheeled) Gait Pattern/deviations: WFL(Within Functional Limits);Step-through pattern (With R CAM Boot)         Stairs             Wheelchair Mobility    Modified Rankin (Stroke Patients Only)       Balance                                             Cognition Arousal/Alertness: Awake/alert Behavior During Therapy: WFL for tasks assessed/performed Overall Cognitive Status: Within Functional Limits for tasks assessed                                        Exercises      General Comments General comments (skin integrity, edema, etc.):  (Pt educated on donning/doffing CAM boot with MinA for proper application)      Pertinent Vitals/Pain Pain Assessment: No/denies pain    Home Living                      Prior Function            PT Goals (current goals can now be found in the care plan section) Acute Rehab PT Goals Patient Stated Goal: Go home    Frequency    Min 2X/week      PT Plan      Co-evaluation              AM-PAC PT "6 Clicks" Mobility   Outcome Measure  Help needed turning from your back to your side while in a  flat bed without using bedrails?: None Help needed moving from lying on your back to sitting on the side of a flat bed without using bedrails?: None Help needed moving to and from a bed to a chair (including a wheelchair)?: None Help needed standing up from a chair using your arms (e.g., wheelchair or bedside chair)?: None Help needed to walk in hospital room?: A Little Help needed climbing 3-5 steps with a railing? : A Little 6 Click Score: 22    End of Session Equipment Utilized During Treatment: Gait belt Activity Tolerance: Patient tolerated treatment well Patient left: in bed;with call bell/phone within reach Nurse Communication: Mobility status PT Visit Diagnosis: Muscle weakness (generalized) (M62.81);Difficulty in walking, not elsewhere classified (R26.2);Unsteadiness on feet (R26.81)     Time: 1405-1430 PT Time Calculation (min) (ACUTE ONLY): 25 min  Charges:  $Gait Training: 8-22 mins $Therapeutic Activity: 8-22 mins                    Mikel Cella, PTA    Josie Dixon 11/12/2020, 2:41 PM

## 2020-11-12 NOTE — Progress Notes (Signed)
   PODIATRY PROGRESS NOTE  NAME Fernando Vega MRN [id number] DOB 15-Apr-1970 DOA 11/01/2020   Subjective: Patient status postop partial ray amputation, second and third, RT foot 11/05/2020.  Patient states that he is doing well.  He is resting comfortably in bed.  The wound VAC is intact and functioning properly.  Denies N/V/SOB/CP.   Past Medical History:  Diagnosis Date   Diabetes mellitus without complication ([place])    Neuropathy    Sarcoidosis    Sleep apnea     CBC Latest Ref Rng & Units 11/11/2020 11/10/2020 11/09/2020  WBC 4.0 - 10.5 K/uL 13.2(H) 13.3(H) 11.7(H)  Hemoglobin 13.0 - 17.0 g/dL 8.4(L) 8.4(L) 8.7(L)  Hematocrit 39.0 - 52.0 % 26.0(L) 26.7(L) 27.4(L)  Platelets 150 - 400 K/uL 382 370 380    BMP Latest Ref Rng & Units 11/10/2020 11/09/2020 11/08/2020  Glucose 70 - 99 mg/dL 186(H) 206(H) 187(H)  BUN 6 - 20 mg/dL '12 15 17  '$ Creatinine 0.61 - 1.24 mg/dL 0.71 0.65 0.65  Sodium 135 - 145 mmol/L 135 135 135  Potassium 3.5 - 5.1 mmol/L 4.4 3.7 3.5  Chloride 98 - 111 mmol/L 98 97(L) 100  CO2 22 - 32 mmol/L 33(H) 31 30  Calcium 8.9 - 10.3 mg/dL 7.7(L) 7.7(L) 7.9(L)    ASSESSMENT/PLAN OF CARE S/P partial ray amputation RT foot, 2nd 3rd. DOS: 11/05/2020 - Wound VAC was left intact today. -Unfortunately the patient does not have insurance.  Case manager working on outpatient charity wound VAC and home health nurse for wound VAC changes and PICC line care.  Their efforts are greatly appreciated.  Due to the depth and extent of the amputation site wound VAC would be in the patient's best interest.  If we are unsuccessful to arrange a wound VAC outpatient post discharge we will remove the wound VAC and apply dry sterile dressings and have him follow-up in the office for outpatient care. -Our office will round this weekend for dressing change; either reapply the wound VAC if this can be an option going home or remove the wound VAC and apply dry sterile dressings.   -Appreciate PICC line  placement and 6 weeks IV Ancef -I suspect after dressing change this weekend he would be okay to go home/discharge from a podiatry/surgical standpoint.     Please contact me directly with any questions or concerns.  Cell [phone]   Edrick Kins, DPM [facility]  Dr. Edrick Kins, DPM    [address], [place] [address]                Office [phone]  Fax [phone]

## 2020-11-13 LAB — GLUCOSE, CAPILLARY
Glucose-Capillary: 248 mg/dL — ABNORMAL HIGH (ref 70–99)
Glucose-Capillary: 228 mg/dL — ABNORMAL HIGH (ref 70–99)
Glucose-Capillary: 204 mg/dL — ABNORMAL HIGH (ref 70–99)
Glucose-Capillary: 194 mg/dL — ABNORMAL HIGH (ref 70–99)

## 2020-11-13 MED ORDER — POLYETHYLENE GLYCOL 3350 17 G PO PACK
17.0000 g | PACK | Freq: Every day | ORAL | Status: DC
  Administered 2020-11-13 – 2020-11-14 (×2): 17 g via ORAL
  Filled 2020-11-13 (×3): qty 1

## 2020-11-13 NOTE — Progress Notes (Signed)
   PODIATRY PROGRESS NOTE  NAME GARI TUBRIDY MRN [id number] DOB 1970-03-19 DOA 11/01/2020   Subjective: Patient status postop partial ray amputation, second and third, RT foot 11/05/2020.  Patient states that he is doing well.  He is resting comfortably in bed.  The wound VAC is intact and functioning properly.  Denies N/V/SOB/CP.   Past Medical History:  Diagnosis Date   Diabetes mellitus without complication ([place])    Neuropathy    Sarcoidosis    Sleep apnea     CBC Latest Ref Rng & Units 11/11/2020 11/10/2020 11/09/2020  WBC 4.0 - 10.5 K/uL 13.2(H) 13.3(H) 11.7(H)  Hemoglobin 13.0 - 17.0 g/dL 8.4(L) 8.4(L) 8.7(L)  Hematocrit 39.0 - 52.0 % 26.0(L) 26.7(L) 27.4(L)  Platelets 150 - 400 K/uL 382 370 380    BMP Latest Ref Rng & Units 11/10/2020 11/09/2020 11/08/2020  Glucose 70 - 99 mg/dL 186(H) 206(H) 187(H)  BUN 6 - 20 mg/dL '12 15 17  '$ Creatinine 0.61 - 1.24 mg/dL 0.71 0.65 0.65  Sodium 135 - 145 mmol/L 135 135 135  Potassium 3.5 - 5.1 mmol/L 4.4 3.7 3.5  Chloride 98 - 111 mmol/L 98 97(L) 100  CO2 22 - 32 mmol/L 33(H) 31 30  Calcium 8.9 - 10.3 mg/dL 7.7(L) 7.7(L) 7.9(L)    ASSESSMENT/PLAN OF CARE S/P partial ray amputation RT foot, 2nd 3rd. DOS: 11/05/2020 - Wound VAC was removed.  I discussed with the patient that he would just benefit from local wound care with Betadine wet-to-dry while being in an inpatient setting. -Patient will need Betadine wet-to-dry dressing changes daily until transferred over to the wound care center. -He would benefit from being scheduled at the wound care center on an outpatient setting for advanced wound care. -Appreciate PICC line placement and 6 weeks IV Ancef - He is okay to be discharged from podiatric standpoint without wound VAC and local wound care and dressing. -He will need to make an appointment with [facility] wound care center for further evaluation and management. -We will follow him in an outpatient setting every few weeks but the wound will  primarily be managed by [facility] center.

## 2020-11-13 NOTE — Progress Notes (Signed)
Patient ID: Fernando Vega, male   DOB: September 27, 1970, 50 y.o.   MRN: [id number] [facility] PROGRESS NOTE  Fernando Vega [id number] DOB: 11/30/1970 DOA: 11/01/2020 PCP: Patient, No Pcp Per (Inactive)  HPI/Subjective: Patient feels okay.  Offers no complaints.  I have not heard anything back from home health agencies at this point.  Patient was admitted with necrotizing soft tissue infection of the right foot and the second toe.  Objective: Vitals:   11/13/20 0749 11/13/20 1554  BP: (!) 151/82 129/75  Pulse: 88 95  Resp: 17 17  Temp: 98.3 F (36.8 C) 98.1 F (36.7 C)  SpO2: 97% 96%    Intake/Output Summary (Last 24 hours) at 11/13/2020 1735 Last data filed at 11/13/2020 1401 Gross per 24 hour  Intake 480 ml  Output 3300 ml  Net -2820 ml   Filed Weights   11/08/20 0437 11/11/20 0500 11/13/20 0547  Weight: 96 kg 100.3 kg 97.9 kg    ROS: Review of Systems  Respiratory:  Negative for shortness of breath.   Cardiovascular:  Negative for chest pain.  Gastrointestinal:  Negative for abdominal pain, nausea and vomiting.  Exam: Physical Exam HENT:     Head: Normocephalic.     Mouth/Throat:     Pharynx: No oropharyngeal exudate.  Eyes:     General: Lids are normal.     Conjunctiva/sclera: Conjunctivae normal.     Pupils: Pupils are equal, round, and reactive to light.  Cardiovascular:     Rate and Rhythm: Normal rate and regular rhythm.     Heart sounds: Normal heart sounds, S1 normal and S2 normal.  Pulmonary:     Breath sounds: Normal breath sounds. No decreased breath sounds, wheezing, rhonchi or rales.  Abdominal:     Palpations: Abdomen is soft.     Tenderness: There is no abdominal tenderness.  Musculoskeletal:     Right lower leg: No swelling.     Left lower leg: No swelling.  Skin:    General: Skin is warm.     Comments: Right foot covered.  Neurological:     Mental Status: He is alert and oriented to person, place, and time.      Scheduled Meds:   vitamin C  250 mg Oral Daily   Chlorhexidine Gluconate Cloth  6 each Topical Daily   enoxaparin (LOVENOX) injection  40 mg Subcutaneous Q24H   insulin aspart  0-15 Units Subcutaneous TID WC   insulin aspart  5 Units Subcutaneous TID WC   insulin glargine-yfgn  5 Units Subcutaneous Daily   multivitamin with minerals  1 tablet Oral Daily   nutrition supplement (JUVEN)  1 packet Oral BID BM   polyethylene glycol  17 g Oral Daily   Ensure Max Protein  11 oz Oral BID   sodium chloride flush  10-40 mL Intracatheter Q12H   Continuous Infusions:   ceFAZolin (ANCEF) IV 2 g (11/13/20 1306)    Assessment/Plan:  Necrotizing soft tissue infection of right foot involving the right second toe.  Patient had amputation of the toe.  Patient also had a second OR debridement for wound cleanout and wound VAC placement.  Podiatry today changed the dressing recommendations to Betadine wet-to-dry because were having trouble setting up home health.  Patient will require 6 weeks of IV Ancef.  Patient has PICC line.  Saw podiatry recommendations on changing the dressing this evening.  We will have to coordinate with transitional care team tomorrow about setting up the home antibiotics. Type  2 diabetes mellitus with neuropathy.  Hemoglobin A1c elevated at 11.5.  On low-dose glargine insulin and short acting insulin prior to meals. Anemia of chronic disease Leukocytosis     Code Status:     Code Status Orders  (From admission, onward)           Start     Ordered   11/01/20 1143  Full code  Continuous        11/01/20 1144           Code Status History     This patient has a current code status but no historical code status.      Family Communication: Spoke with wife on the phone Disposition Plan: Status is: Inpatient  Dispo: The patient is from: Home              Anticipated d/c is to: Home but likely unable to set up home health              Patient currently will have to speak with  transitional care team to set up home antibiotics and nursing staff will have to teach wife how to change dressing.   Difficult to place patient.  No.  Consultants: Podiatry  Antibiotics: IV Ancef  Time spent: 26 minutes  [place]

## 2020-11-14 LAB — GLUCOSE, CAPILLARY
Glucose-Capillary: 224 mg/dL — ABNORMAL HIGH (ref 70–99)
Glucose-Capillary: 247 mg/dL — ABNORMAL HIGH (ref 70–99)
Glucose-Capillary: 155 mg/dL — ABNORMAL HIGH (ref 70–99)
Glucose-Capillary: 254 mg/dL — ABNORMAL HIGH (ref 70–99)

## 2020-11-14 LAB — BASIC METABOLIC PANEL
Anion gap: 7 (ref 5–15)
BUN: 19 mg/dL (ref 6–20)
CO2: 33 mmol/L — ABNORMAL HIGH (ref 22–32)
Calcium: 8.5 mg/dL — ABNORMAL LOW (ref 8.9–10.3)
Chloride: 96 mmol/L — ABNORMAL LOW (ref 98–111)
Creatinine, Ser: 0.65 mg/dL (ref 0.61–1.24)
GFR, Estimated: >60 mL/min (ref >60)
Glucose, Bld: 232 mg/dL — ABNORMAL HIGH (ref 70–99)
Potassium: 4.1 mmol/L (ref 3.5–5.1)
Sodium: 136 mmol/L (ref 135–145)

## 2020-11-14 LAB — CBC
HCT: 26.2 % — ABNORMAL LOW (ref 39.0–52.0)
Hemoglobin: 8.3 g/dL — ABNORMAL LOW (ref 13.0–17.0)
MCH: 29.9 pg (ref 26.0–34.0)
MCHC: 31.7 g/dL (ref 30.0–36.0)
MCV: 94.2 fL (ref 80.0–100.0)
Platelets: 486 10*3/uL — ABNORMAL HIGH (ref 150–400)
RBC: 2.78 MIL/uL — ABNORMAL LOW (ref 4.22–5.81)
RDW: 12.7 % (ref 11.5–15.5)
WBC: 11.4 10*3/uL — ABNORMAL HIGH (ref 4.0–10.5)
nRBC: 0.4 % — ABNORMAL HIGH (ref 0.0–0.2)

## 2020-11-14 MED ORDER — INSULIN GLARGINE-YFGN 100 UNIT/ML ~~LOC~~ SOLN
7.0000 [IU] | Freq: Every day | SUBCUTANEOUS | Status: DC
  Administered 2020-11-15: 7 [IU] via SUBCUTANEOUS
  Filled 2020-11-14 (×2): qty 0.07

## 2020-11-14 NOTE — TOC Progression Note (Signed)
Transition of Care [facility]) - Progression Note    Patient Details  Name: Fernando Vega MRN: [id number] Date of Birth: February 06, 1971  Transition of Care [facility]) CM/SW Contact  Izola Price, RN Phone Number: 11/14/2020, 2:01 PM  Clinical Narrative: Per provider, wound vac is now out per podiatry and dressing is wet to dry with Betadine. Wife and patient have been educated on once a day dressing changes.   Concerns now are that patient will need RN monitoring of PICC line site with dressing changes, PICC line removal when antibiotics complete and labs drawn.   Prior CM exhausted many possible agencies with no agency accepting. Will try again now that wound vac removed.   Contacted [facility] and it may be Tuesday before antibiotics can be delivered due to Labor Day holiday. She will reach out to [facility] Monday to let us know.     Simmie Davies RN CM         Expected Discharge Plan and Services                                                 Social Determinants of Health (SDOH) Interventions    Readmission Risk Interventions No flowsheet data found.

## 2020-11-14 NOTE — Progress Notes (Signed)
Patient ID: Fernando Vega, male   DOB: [birth year], 50 y.o.   MRN: [id number] [facility] PROGRESS NOTE  Fernando Vega [id number] DOB: 1970/10/03 DOA: 11/01/2020 PCP: Patient, No Pcp Per (Inactive)  HPI/Subjective: Patient feels okay.  Offers no complaints.  Has some pain in his foot but not bad.  No complaints of any shortness of breath.  Admitted 13 days ago with necrotizing soft tissue infection.  Objective: Vitals:   11/14/20 0750 11/14/20 1119  BP: (!) 143/84 (!) 149/88  Pulse: 84 89  Resp: 18 18  Temp: 98.6 F (37 C) 98.9 F (37.2 C)  SpO2: 92% 97%    Intake/Output Summary (Last 24 hours) at 11/14/2020 1443 Last data filed at 11/14/2020 1200 Gross per 24 hour  Intake --  Output 2950 ml  Net -2950 ml   Filed Weights   11/08/20 0437 11/11/20 0500 11/13/20 0547  Weight: 96 kg 100.3 kg 97.9 kg    ROS: Review of Systems  Respiratory:  Negative for shortness of breath.   Cardiovascular:  Negative for chest pain.  Gastrointestinal:  Negative for abdominal pain, nausea and vomiting.  Musculoskeletal:  Negative for joint pain.  Exam: Physical Exam HENT:     Head: Normocephalic.     Mouth/Throat:     Pharynx: No oropharyngeal exudate.  Eyes:     General: Lids are normal.     Conjunctiva/sclera: Conjunctivae normal.  Cardiovascular:     Rate and Rhythm: Normal rate and regular rhythm.     Heart sounds: Normal heart sounds, S1 normal and S2 normal.  Pulmonary:     Breath sounds: Normal breath sounds. No decreased breath sounds, wheezing, rhonchi or rales.  Abdominal:     Palpations: Abdomen is soft.     Tenderness: There is no abdominal tenderness.  Musculoskeletal:     Right lower leg: No swelling.     Left lower leg: No swelling.  Skin:    General: Skin is warm.     Comments: Right foot covered.  Neurological:     Mental Status: He is alert and oriented to person, place, and time.      Scheduled Meds:  vitamin C  250 mg Oral Daily   Chlorhexidine  Gluconate Cloth  6 each Topical Daily   enoxaparin (LOVENOX) injection  40 mg Subcutaneous Q24H   insulin aspart  0-15 Units Subcutaneous TID WC   insulin aspart  5 Units Subcutaneous TID WC   [START ON 11/15/2020] insulin glargine-yfgn  7 Units Subcutaneous Daily   multivitamin with minerals  1 tablet Oral Daily   nutrition supplement (JUVEN)  1 packet Oral BID BM   polyethylene glycol  17 g Oral Daily   Ensure Max Protein  11 oz Oral BID   sodium chloride flush  10-40 mL Intracatheter Q12H   Continuous Infusions:   ceFAZolin (ANCEF) IV 2 g (11/14/20 1418)    Assessment/Plan:  Necrotizing soft tissue infection of the right foot involving the right second toe.  Patient had amputation of the second toe on the right.  Patient also had a second or debridement and wound cleanout and wound VAC placement.  Podiatry changed the recommendations to Betadine wet-to-dry.  Patient has a PICC line and will require 6 weeks of IV Ancef.  Spoke with transitional care team today and they are unable to get antibiotics delivered until Tuesday.  Transitional care team still trying to set up home health.  If unable to set up home health may be we  can get him to the infusion center for PICC line bandage change and PICC line care and sending off labs.  Nursing staff to teach wife how to do dressing changes. Type 2 diabetes mellitus with neuropathy.  Hemoglobin A1c elevated 11.5.  Sugars more elevated today will increase glargine insulin to 7 units daily can continue short acting insulin prior to meals. Anemia of chronic disease.  Hemoglobin stable Leukocytosis last white blood cell count 11.4 which is almost in the normal range.        Code Status:     Code Status Orders  (From admission, onward)           Start     Ordered   11/01/20 1143  Full code  Continuous        11/01/20 1144           Code Status History     This patient has a current code status but no historical code status.       Family Communication: Spoke with wife on the phone Disposition Plan: Status is: Inpatient  Dispo: The patient is from: Home              Anticipated d/c is to: Home.  Still trying to set up home health services.  Likely discharge 9/6              Patient currently medically stable   Difficult to set up home health services.  Yes  Antibiotics: IV Ancef  Time spent: 26 minutes  [name]

## 2020-11-14 NOTE — Plan of Care (Signed)
No acute events during the night. VSS. Safety maintained. IV abx administered.  Problem: Education: Goal: Knowledge of General Education information will improve Description: Including pain rating scale, medication(s)/side effects and non-pharmacologic comfort measures Outcome: Progressing   Problem: Health Behavior/Discharge Planning: Goal: Ability to manage health-related needs will improve Outcome: Progressing   Problem: Clinical Measurements: Goal: Ability to maintain clinical measurements within normal limits will improve Outcome: Progressing Goal: Will remain free from infection Outcome: Progressing Goal: Diagnostic test results will improve Outcome: Progressing Goal: Respiratory complications will improve Outcome: Progressing Goal: Cardiovascular complication will be avoided Outcome: Progressing   Problem: Activity: Goal: Risk for activity intolerance will decrease Outcome: Progressing   Problem: Nutrition: Goal: Adequate nutrition will be maintained Outcome: Progressing   Problem: Coping: Goal: Level of anxiety will decrease Outcome: Progressing   Problem: Elimination: Goal: Will not experience complications related to bowel motility Outcome: Progressing Goal: Will not experience complications related to urinary retention Outcome: Progressing   Problem: Pain Managment: Goal: General experience of comfort will improve Outcome: Progressing   Problem: Safety: Goal: Ability to remain free from injury will improve Outcome: Progressing   Problem: Skin Integrity: Goal: Risk for impaired skin integrity will decrease Outcome: Progressing

## 2020-11-14 NOTE — Progress Notes (Signed)
Wound dressing changed per order at this time.Pt's wife did not visit Pt today.MD order to educate Pt's wife on wound dressing change unable at this time.Pt tolerated wound dressing change,no verbal c/o of pain or any discomfort. Betadine wet to dry dressing using Gauze was applied to wound bed,covered with kerlix and wrapped with ace bandage .

## 2020-11-15 LAB — GLUCOSE, CAPILLARY
Glucose-Capillary: 262 mg/dL — ABNORMAL HIGH (ref 70–99)
Glucose-Capillary: 206 mg/dL — ABNORMAL HIGH (ref 70–99)
Glucose-Capillary: 150 mg/dL — ABNORMAL HIGH (ref 70–99)
Glucose-Capillary: 235 mg/dL — ABNORMAL HIGH (ref 70–99)

## 2020-11-15 MED ORDER — INSULIN GLARGINE-YFGN 100 UNIT/ML ~~LOC~~ SOLN
8.0000 [IU] | Freq: Every day | SUBCUTANEOUS | Status: DC
  Administered 2020-11-16: 8 [IU] via SUBCUTANEOUS
  Filled 2020-11-15: qty 0.08

## 2020-11-15 NOTE — Plan of Care (Signed)
No acute events during the night. VSS. Dressing intact.  Problem: Education: Goal: Knowledge of General Education information will improve Description: Including pain rating scale, medication(s)/side effects and non-pharmacologic comfort measures Outcome: Progressing   Problem: Health Behavior/Discharge Planning: Goal: Ability to manage health-related needs will improve Outcome: Progressing   Problem: Clinical Measurements: Goal: Ability to maintain clinical measurements within normal limits will improve Outcome: Progressing Goal: Will remain free from infection Outcome: Progressing Goal: Diagnostic test results will improve Outcome: Progressing Goal: Respiratory complications will improve Outcome: Progressing Goal: Cardiovascular complication will be avoided Outcome: Progressing   Problem: Activity: Goal: Risk for activity intolerance will decrease Outcome: Progressing   Problem: Nutrition: Goal: Adequate nutrition will be maintained Outcome: Progressing   Problem: Coping: Goal: Level of anxiety will decrease Outcome: Progressing   Problem: Elimination: Goal: Will not experience complications related to bowel motility Outcome: Progressing Goal: Will not experience complications related to urinary retention Outcome: Progressing   Problem: Pain Managment: Goal: General experience of comfort will improve Outcome: Progressing   Problem: Safety: Goal: Ability to remain free from injury will improve Outcome: Progressing   Problem: Skin Integrity: Goal: Risk for impaired skin integrity will decrease Outcome: Progressing

## 2020-11-15 NOTE — Progress Notes (Signed)
Wife at bedside, educated wife on dressing change.

## 2020-11-15 NOTE — TOC Progression Note (Signed)
Transition of Care [facility]) - Progression Note    Patient Details  Name: Fernando Vega MRN: [id number] Date of Birth: 02-26-1971  Transition of Care [facility]) CM/SW [place], RN Phone Number: 11/15/2020, 12:32 PM  Clinical Narrative:  Care team discussed discharge plans:  recommendation is still that patient go home with [facility], will check new charity [facility], [place] for a nurse.  Other options are for patient to come to infusion clinic once per week for labs and PICC dressing change if this is possible.   TOC to follow, TOC contact information given.          Expected Discharge Plan and Services                                                 Social Determinants of Health (SDOH) Interventions    Readmission Risk Interventions No flowsheet data found.

## 2020-11-15 NOTE — Progress Notes (Signed)
Patient ID: Fernando Vega, male   DOB: 03/26/1970, 50 y.o.   MRN: [id number] [facility] PROGRESS NOTE  Fernando Vega [id number] DOB: 09-10-1970 DOA: 11/01/2020 PCP: Patient, No Pcp Per (Inactive)  HPI/Subjective: Patient became very upset that he is still here in the hospital.  Unfortunately the agency delivering the antibiotic will be able to deliver the antibiotic till Tuesday.  We still have not set up home health or a plan as outpatient.  Objective: Vitals:   11/15/20 1236 11/15/20 1458  BP: 135/82 133/76  Pulse: 85 90  Resp: 18 18  Temp: 98.2 F (36.8 C) 98.3 F (36.8 C)  SpO2: 96% 93%    Intake/Output Summary (Last 24 hours) at 11/15/2020 1542 Last data filed at 11/15/2020 1249 Gross per 24 hour  Intake --  Output 2750 ml  Net -2750 ml   Filed Weights   11/08/20 0437 11/11/20 0500 11/13/20 0547  Weight: 96 kg 100.3 kg 97.9 kg    ROS: Review of Systems  Respiratory:  Negative for shortness of breath.   Cardiovascular:  Negative for chest pain.  Gastrointestinal:  Negative for abdominal pain.  Musculoskeletal:  Positive for joint pain.  Exam: Physical Exam HENT:     Head: Normocephalic.     Mouth/Throat:     Pharynx: No oropharyngeal exudate.  Eyes:     General: Lids are normal.     Conjunctiva/sclera: Conjunctivae normal.     Pupils: Pupils are equal, round, and reactive to light.  Cardiovascular:     Rate and Rhythm: Normal rate and regular rhythm.     Heart sounds: Normal heart sounds, S1 normal and S2 normal.  Pulmonary:     Breath sounds: Normal breath sounds. No decreased breath sounds, wheezing, rhonchi or rales.  Abdominal:     Palpations: Abdomen is soft.     Tenderness: There is no abdominal tenderness.  Musculoskeletal:     Right lower leg: No swelling.     Left lower leg: No swelling.  Skin:    General: Skin is warm.     Comments: Right foot wrapped  Neurological:     Mental Status: He is alert and oriented to person, place, and time.       Scheduled Meds:  vitamin C  250 mg Oral Daily   Chlorhexidine Gluconate Cloth  6 each Topical Daily   enoxaparin (LOVENOX) injection  40 mg Subcutaneous Q24H   insulin aspart  0-15 Units Subcutaneous TID WC   insulin aspart  5 Units Subcutaneous TID WC   insulin glargine-yfgn  7 Units Subcutaneous Daily   multivitamin with minerals  1 tablet Oral Daily   nutrition supplement (JUVEN)  1 packet Oral BID BM   polyethylene glycol  17 g Oral Daily   Ensure Max Protein  11 oz Oral BID   sodium chloride flush  10-40 mL Intracatheter Q12H   Continuous Infusions:   ceFAZolin (ANCEF) IV 2 g (11/15/20 1417)    Assessment/Plan:  1.  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  Patient is status post amputation of the second toe.  Patient also had a second debridement and wound cleanout and wound VAC placement.  Podiatry changed the recommendations to Betadine wet-to-dry because were having trouble setting up home health.  Patient has a PICC line will require 6 weeks of IV Ancef.  Transitional care team still trying to work on home health.  If unable to do home health can try  to set up PICC line care and labs through the infusion center on a weekly basis.  Nursing staff to teach wife how to do dressing changes for the foot. 2.  Type 2 diabetes mellitus with neuropathy.  Hemoglobin A1c elevated at 11.5.  Increase glargine insulin to 8 units daily and continue short acting insulin prior to meals 3.  Anemia of chronic disease.  Hemoglobin stable but low at 8.3 4.  Leukocytosis secondary to infection       Code Status:     Code Status Orders  (From admission, onward)           Start     Ordered   11/01/20 1143  Full code  Continuous        11/01/20 1144           Code Status History     This patient has a current code status but no historical code status.      Family Communication: Spoke with wife on the phone Disposition Plan:  Status is: Inpatient  Dispo: The patient is from: Home              Anticipated d/c is to: Home              Patient currently medically stable to be discharged but still trying to coordinate discharge plan.  Patient will be discharged tomorrow no matter what.   Difficult to place patient.  No.  Time spent: 27 minutes  [place]

## 2020-11-16 LAB — CREATININE, SERUM
Creatinine, Ser: 0.55 mg/dL — ABNORMAL LOW (ref 0.61–1.24)
GFR, Estimated: >60 mL/min (ref >60)

## 2020-11-16 LAB — CBC WITH DIFFERENTIAL/PLATELET
Abs Immature Granulocytes: 0.16 10*3/uL — ABNORMAL HIGH (ref 0.00–0.07)
Basophils Absolute: 0.1 10*3/uL (ref 0.0–0.1)
Basophils Relative: 1 %
Eosinophils Absolute: 0.1 10*3/uL (ref 0.0–0.5)
Eosinophils Relative: 2 %
HCT: 28.1 % — ABNORMAL LOW (ref 39.0–52.0)
Hemoglobin: 9.1 g/dL — ABNORMAL LOW (ref 13.0–17.0)
Immature Granulocytes: 2 %
Lymphocytes Relative: 24 %
Lymphs Abs: 2.2 10*3/uL (ref 0.7–4.0)
MCH: 30.4 pg (ref 26.0–34.0)
MCHC: 32.4 g/dL (ref 30.0–36.0)
MCV: 94 fL (ref 80.0–100.0)
Monocytes Absolute: 1.3 10*3/uL — ABNORMAL HIGH (ref 0.1–1.0)
Monocytes Relative: 14 %
Neutro Abs: 5.2 10*3/uL (ref 1.7–7.7)
Neutrophils Relative %: 57 %
Platelets: 476 10*3/uL — ABNORMAL HIGH (ref 150–400)
RBC: 2.99 MIL/uL — ABNORMAL LOW (ref 4.22–5.81)
RDW: 13.1 % (ref 11.5–15.5)
WBC: 9.1 10*3/uL (ref 4.0–10.5)
nRBC: 0.4 % — ABNORMAL HIGH (ref 0.0–0.2)

## 2020-11-16 LAB — GLUCOSE, CAPILLARY
Glucose-Capillary: 260 mg/dL — ABNORMAL HIGH (ref 70–99)
Glucose-Capillary: 268 mg/dL — ABNORMAL HIGH (ref 70–99)
Glucose-Capillary: 189 mg/dL — ABNORMAL HIGH (ref 70–99)

## 2020-11-16 LAB — C-REACTIVE PROTEIN: CRP: 4.1 mg/dL — ABNORMAL HIGH (ref <1.0)

## 2020-11-16 MED ORDER — POLYETHYLENE GLYCOL 3350 17 G PO PACK: 17.0000 g | PACK | Freq: Every day | ORAL | 0 refills | Status: DC | PRN

## 2020-11-16 MED ORDER — OXYCODONE HCL 5 MG PO TABS: 5.0000 mg | ORAL_TABLET | Freq: Four times a day (QID) | ORAL | 0 refills | Status: DC | PRN

## 2020-11-16 MED ORDER — INSULIN GLARGINE-YFGN 100 UNIT/ML ~~LOC~~ SOLN
10.0000 [IU] | Freq: Every day | SUBCUTANEOUS | Status: DC
  Filled 2020-11-16: qty 0.1

## 2020-11-16 MED ORDER — BASAGLAR KWIKPEN 100 UNIT/ML ~~LOC~~ SOPN: 10.0000 [IU] | PEN_INJECTOR | Freq: Every day | SUBCUTANEOUS | 1 refills | Status: DC

## 2020-11-16 NOTE — TOC Progression Note (Addendum)
Transition of Care [facility]) - Progression Note    Patient Details  Name: Fernando Vega MRN: [id number] Date of Birth: 25-Oct-1970  Transition of Care [facility]) CM/SW Contact  Su Hilt, RN Phone Number: 11/16/2020, 1:20 PM  Clinical Narrative:      [facility] called and stated due to staffing for nursing in the [place] area they would not be able to accept the patient even with an LOG, I called the outpatient same day surgery center to set up outpatient PICC line dressing change and Lab draws, They stated that they would call me back    Update, They called back and stated they will schedule him an appointment to come in for PICC line dressing change and lab draw and call him with an appointment  Called wound care to get him set up, the first available is Sept 21st, the patient agrees tro the 21st at 10 am He needs a rolling walker, I contacted rhonda with [facility] to do the charity Rolling walker  Expected Discharge Plan and Services                                                 Social Determinants of Health (SDOH) Interventions    Readmission Risk Interventions No flowsheet data found.

## 2020-11-16 NOTE — TOC Progression Note (Signed)
Transition of Care [facility]) - Progression Note    Patient Details  Name: QUASHUN BOMER MRN: [id number] Date of Birth: [birth year]  Transition [facility] Care [facility]) CM/SW [place], RN Phone Number: 11/16/2020, 10:33 AM  Clinical Narrative:     I reached out to [place] for charity [facility] for IV ABX, The wife is at bedside, [place] has spoken with him to fill out the financial paperwork, They will let me know if they will be able to accept him, Pam [facility] is aware that the patient plans to DC today and is planning to do the 10 PM dose at home, nawaiting a call back from [facility]       Expected Discharge Plan and Services                                                 Social Determinants of Health (SDOH) Interventions    Readmission Risk Interventions No flowsheet data found.

## 2020-11-16 NOTE — Progress Notes (Signed)
Nutrition Follow-up  DOCUMENTATION CODES:  Not applicable  INTERVENTION:  Continue current diet as ordered, encourage PO intake. Continue Juven BID to support wound healing Continue MVI with minerals daily Add 250 mg of vitamin c daily to support wound healing   NUTRITION DIAGNOSIS:  Increased nutrient needs related to wound healing as evidenced by estimated needs.  GOAL:  Patient will meet greater than or equal to 90% of their needs  MONITOR:  PO intake, Supplement acceptance, Labs, Weight trends, Skin, I & O's  REASON FOR ASSESSMENT:  Consult Wound healing  ASSESSMENT:  50 y.o. male with hx of DM with neuropathy, presented to ED with right foot pain after stepping on a screw several days prior. Redness worsening to the foot and toe with drainage on admission  Podiatry evaluated wound and imaging suggestive of necrotizing soft tissue infection of the second digit.   8/22: Pt taken for I&D with plans to go back to OR for further intervention. 8/26: for partial ray amputation of second and third toes due to poor healing due to ischemia 8/30: I&D, wound vac placement 9/3: wound vac removed  Pt resting in bed at the time of assessment. Pt scheduled to be discharged later this afternoon after antibiotics are given. Discussed intake since last assessment with pt. Reports that appetite has been good and he is probably eating more here than at home. Reports at home he normally has breakfast and dinner but does not eat much at lunch. Discussed the importance of adequate protein and fluid intake to help with wound healing. Discussed snacks and food items that could be added at mid-day snack/meal to help meet protein needs. Pt expresses understanding.   Average Meal Intake: 8/24-8/30: 78% intake x 9 recorded meals (50-100%) 8/31-9/6: 89% intake x 6 recorded meals (35-100%)  Nutritionally Relevant Medications: Scheduled Meds:  vitamin C  250 mg Oral Daily   insulin aspart  0-15 Units  Subcutaneous TID WC   insulin aspart  5 Units Subcutaneous TID WC   insulin glargine-yfgn  8 Units Subcutaneous Daily   multivitamin with minerals  1 tablet Oral Daily   nutrition supplement (JUVEN)  1 packet Oral BID BM   polyethylene glycol  17 g Oral Daily   Ensure Max Protein  11 oz Oral BID   Continuous Infusions:   ceFAZolin (ANCEF) IV 2 g (11/16/20 0645)   PRN Meds: ondansetron, promethazine  Labs Reviewed: SBG ranges from 150-262 mg/dL over the last 24 hours HgbA1c 11.8% (8/22)  NUTRITION - FOCUSED PHYSICAL EXAM: Flowsheet Row Most Recent Value  Orbital Region No depletion  Upper Arm Region No depletion  Thoracic and Lumbar Region No depletion  Buccal Region No depletion  Temple Region No depletion  Clavicle Bone Region No depletion  Clavicle and Acromion Bone Region No depletion  Scapular Bone Region No depletion  Dorsal Hand No depletion  Patellar Region No depletion  Anterior Thigh Region No depletion  Posterior Calf Region No depletion  Edema (RD Assessment) Mild  [RLE]  Hair Reviewed  Eyes Reviewed  Mouth Reviewed  Skin Reviewed  Nails Reviewed   Diet Order:   Diet Order             Diet Carb Modified Fluid consistency: Thin; Room service appropriate? Yes  Diet effective now                  EDUCATION NEEDS:  Education needs have been addressed  Skin:  Skin Assessment: Skin Integrity Issues: Skin Integrity  Issues:: Incisions Incisions: R foot  Last BM:  9/5  Height:  Ht Readings from Last 1 Encounters:  11/05/20 '5\' 7"'$  (1.702 m)   Weight:  Wt Readings from Last 1 Encounters:  11/13/20 97.9 kg   Ideal Body Weight:  67.3 kg  BMI:  Body mass index is 33.8 kg/m.  Estimated Nutritional Needs:  Kcal:  2050-2250 Protein:  105-120 grams Fluid:  >2 L   Ranell Patrick, RD, LDN Clinical Dietitian Pager on [place]

## 2020-11-16 NOTE — Progress Notes (Signed)
DISCHARGE NOTE:  Pt given discharge instructions. Extra supplies for dressing change given, PICC line dressing changed, clean, dry intact. Walker and personal belongings sent with pt. Pt wheeled to car by staff, wife providing transportation.

## 2020-11-16 NOTE — Discharge Instructions (Addendum)
Betadine wet to dry dressing to open area of the wound.  Use sterile 4 x 4's wet with Betadine and placed on the wound.  Cover with Kerlix wrap and Ace bandage.  Walk with boot.  2nd floor same day surgery area will change picc line bandage and draw labs weekly.  They will call you for appointment

## 2020-11-16 NOTE — Discharge Summary (Signed)
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 Diabetes mellitus without complication ([place])   . Neuropathy   . Sarcoidosis   . Sleep apnea     HOSPITAL COURSE:   1.  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  Dr. 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  Wound culture growing staph aureus.  On 11/09/2020 patient brought back to the OR by Dr. Amalia Hailey for irrigation debridement and apply wound VAC.  Unfortunately we were unable to obtain home health services.  The patient had a PICC line placed and the patient will be on Ancef 2 g every 8 hours until 12/12/2020 (total of 6 weeks).  The patient will go to the second floor secondary to surgery site for lab draws on a weekly basis with results to go to Dr. Steva Ready and PICC line bandage change.  Podiatry change the wound care to Betadine wet-to-dry dressings.  The patient's wife was taught how to do these dressing changes.  Nursing staff did give the patient's wife some supplies to go home with.  She will have to go to the medical supply store to buy more supplies.  Follow-up with Dr.  Amalia Hailey podiatry and referral made to the wound care center.  Follow-up with Dr. Steva Ready.  Few pain pills prescribed through [employer].  CRP on 11/01/2020 was elevated at 37.9.  CRP upon discharge 4.1. 2.  Type 2 diabetes mellitus with neuropathy.  Hemoglobin A1c elevated at 11.5.  I increased long-acting insulin to 10 units daily and continue short acting insulin 5 units prior to meals.  Medications prescribed through medication management. 3.  Anemia of chronic disease.  Hemoglobin stable at 9.1. 4.  Leukocytosis secondary to infection.  White blood cell count normalized upon discharge home at 9.1.  DISCHARGE CONDITIONS:   Satisfactory  CONSULTS OBTAINED:  Podiatry Infectious disease  DRUG ALLERGIES:  No Known Allergies  DISCHARGE MEDICATIONS:   Allergies as of 11/16/2020   No Known Allergies      Medication List     TAKE these medications    albuterol 108 (90 Base) MCG/ACT inhaler Commonly known as: VENTOLIN HFA Inhale 2 puffs into the lungs once every 6 (six) hours as needed for wheezing or shortness of breath.   ascorbic acid 250 MG tablet Commonly known as: VITAMIN C Take 1 tablet (250 mg total) by mouth once daily.   Basaglar KwikPen 100 UNIT/ML Inject 10 Units into the skin daily.   ceFAZolin  IVPB Commonly known as: ANCEF Inject 2 g into the vein every 8 (eight) hours. Indication:  MSSA and GBS diabetic foot infection and osteomyelitis First Dose: Yes Last Day  of Therapy:  12/12/2020 Labs - Once weekly:  CBC/D, CMP, ESR and CRP Please pull PIC at completion of IV antibiotics Fax weekly labs to [phone] Method of administration: IV Push Method of administration may be changed at the discretion of home infusion pharmacist based upon assessment of the patient and/or caregiver's ability to self-administer the medication ordered.   HumaLOG KwikPen 100 UNIT/ML KwikPen Generic drug: insulin lispro Inject 5 Units into the skin 3 (three) times daily before  meals.   multivitamin with minerals Tabs tablet Take 1 tablet by mouth once daily.   Novofine Pen Needle 32G X 6 MM Misc Generic drug: Insulin Pen Needle USE AS DIRECTED WITH INSULIN PENS.   oxyCODONE 5 MG immediate release tablet Commonly known as: Oxy IR/ROXICODONE Take 1 tablet (5 mg total) by mouth every 6 (six) hours as needed for severe pain.   polyethylene glycol 17 g packet Commonly known as: MIRALAX / GLYCOLAX Take 17 g by mouth daily as needed.   sodium chloride flush 0.9 % Soln Commonly known as: NS 20 mLs by Intracatheter route as needed (flush picc line after antibiotics).               Durable Medical Equipment  (From admission, onward)           Start     Ordered   11/16/20 1348  For home use only DME Walker rolling  Once       Question Answer Comment  Walker: With 5 Inch Wheels   Patient needs a walker to treat with the following condition Weakness      11/16/20 1347   11/10/20 0924  For home use only DME Negative pressure wound device  Once       Question Answer Comment  Frequency of dressing change 2 times per week   Length of need 3 Months   Dressing type Foam   Amount of suction 125 mm/Hg   Pressure application Continuous pressure   Supplies 10 canisters and 15 dressings per month for duration of therapy      11/10/20 0923              Discharge Care Instructions  (From admission, onward)           Start     Ordered   11/12/20 0000  Change dressing on IV access line weekly and PRN  (Home infusion instructions - [facility] )        11/12/20 1129             DISCHARGE INSTRUCTIONS:   Follow-up open-door clinic Follow-up podiatry Follow-up infectious disease Follow-up wound care center  If you experience worsening of your admission symptoms, develop shortness of breath, life threatening emergency, suicidal or homicidal thoughts you must seek medical attention immediately by calling 911 or calling your  MD immediately  if symptoms less severe.  You Must read complete instructions/literature along with all the possible adverse reactions/side effects for all the Medicines you take and that have been prescribed to you. Take any new Medicines after you have completely understood and accept all the possible adverse reactions/side effects.   Please note  You were cared for by a hospitalist during your hospital stay. If you have any questions about your discharge medications or the care you received while you were in the hospital after you are discharged, you can call the unit and asked to speak with the hospitalist on call if the hospitalist that took care  of you is not available. Once you are discharged, your primary care physician will handle any further medical issues. Please note that NO REFILLS for any discharge medications will be authorized once you are discharged, as it is imperative that you return to your primary care physician (or establish a relationship with a primary care physician if you do not have one) for your aftercare needs so that they can reassess your need for medications and monitor your lab values.    Today   CHIEF COMPLAINT:   Chief Complaint  Patient presents with  . Foot Injury    HISTORY OF PRESENT ILLNESS:  Fernando Vega  is a 50 y.o. male came in with a foot injury   VITAL SIGNS:  Blood pressure (!) 157/87, pulse 90, temperature 98.6 F (37 C), resp. rate 20, height '5\' 7"'  (1.702 m), weight 97.9 kg, SpO2 95 %.  I/O:   Intake/Output Summary (Last 24 hours) at 11/16/2020 1751 Last data filed at 11/16/2020 7048 Gross per 24 hour  Intake 600 ml  Output 1475 ml  Net -875 ml    PHYSICAL EXAMINATION:  GENERAL:  50 y.o.-year-old patient lying in the bed with no acute distress.  EYES: Pupils equal, round, reactive to light and accommodation. No scleral icterus. HEENT: Head atraumatic, normocephalic. Oropharynx and nasopharynx clear.  LUNGS: Normal breath sounds  bilaterally, no wheezing, rales,rhonchi or crepitation. No use of accessory muscles of respiration.  CARDIOVASCULAR: S1, S2 normal. No murmurs, rubs, or gallops.  ABDOMEN: Soft, non-tender, non-distended.  EXTREMITIES: No pedal edema.  Right foot covered. NEUROLOGIC: Cranial nerves II through XII are intact. Muscle strength 5/5 in all extremities. Sensation intact. Gait not checked.  PSYCHIATRIC: The patient is alert and oriented x 3.  SKIN: No obvious rash, lesion, or ulcer.   DATA REVIEW:   CBC Recent Labs  Lab 11/16/20 0919  WBC 9.1  HGB 9.1*  HCT 28.1*  PLT 476*    Chemistries  Recent Labs  Lab 11/14/20 0502 11/16/20 0641  NA 136  --   K 4.1  --   CL 96*  --   CO2 33*  --   GLUCOSE 232*  --   BUN 19  --   CREATININE 0.65 0.55*  CALCIUM 8.5*  --      Microbiology Results  Results for orders placed or performed during the hospital encounter of 11/01/20  Blood culture (single)     Status: None   Collection Time: 11/01/20  9:56 AM   Specimen: BLOOD  Result Value Ref Range Status   Specimen Description BLOOD LEFT FA  Final   Special Requests   Final    BOTTLES DRAWN AEROBIC AND ANAEROBIC Blood Culture adequate volume   Culture   Final    NO GROWTH 5 DAYS Performed at [facility], [address] [place], [place] [address]    Report Status 11/06/2020 FINAL  Final  Aerobic Culture w Gram Stain (superficial specimen)     Status: None   Collection Time: 11/01/20 10:26 AM   Specimen: Foot  Result Value Ref Range Status   Specimen Description   Final    FOOT RIGHT Performed at [facility], [address] [place], [place] [address]    Special Requests   Final    NONE Performed at [facility], [address] [place], [place] [address]    Gram Stain   Final    NO ORGANISMS SEEN SQUAMOUS EPITHELIAL CELLS PRESENT RARE WBC PRESENT, PREDOMINANTLY  MONONUCLEAR NO ORGANISMS SEEN BACTERIA    Culture   Final    RARE STAPHYLOCOCCUS  AUREUS RARE STREPTOCOCCUS AGALACTIAE TESTING AGAINST S. AGALACTIAE NOT ROUTINELY PERFORMED DUE TO PREDICTABILITY OF AMP/PEN/VAN SUSCEPTIBILITY. Performed at [facility], [place] [address] [place], [place] [address]    Report Status 11/04/2020 FINAL  Final   Organism ID, Bacteria STAPHYLOCOCCUS AUREUS  Final      Susceptibility   Staphylococcus aureus - MIC*    CIPROFLOXACIN <=0.5 SENSITIVE Sensitive     ERYTHROMYCIN <=0.25 SENSITIVE Sensitive     GENTAMICIN <=0.5 SENSITIVE Sensitive     OXACILLIN <=0.25 SENSITIVE Sensitive     TETRACYCLINE <=1 SENSITIVE Sensitive     VANCOMYCIN 1 SENSITIVE Sensitive     TRIMETH/SULFA <=10 SENSITIVE Sensitive     CLINDAMYCIN <=0.25 SENSITIVE Sensitive     RIFAMPIN <=0.5 SENSITIVE Sensitive     Inducible Clindamycin NEGATIVE Sensitive     * RARE STAPHYLOCOCCUS AUREUS  Resp Panel by RT-PCR (Flu A&B, Covid) Nasopharyngeal Swab     Status: None   Collection Time: 11/01/20 11:01 AM   Specimen: Nasopharyngeal Swab; Nasopharyngeal(NP) swabs in vial transport medium  Result Value Ref Range Status   SARS Coronavirus 2 by RT PCR NEGATIVE NEGATIVE Final    Comment: (NOTE) SARS-CoV-2 target nucleic acids are NOT DETECTED.  The SARS-CoV-2 RNA is generally detectable in upper respiratory specimens during the acute phase of infection. The lowest concentration of SARS-CoV-2 viral copies this assay can detect is 138 copies/mL. A negative result does not preclude SARS-Cov-2 infection and should not be used as the sole basis for treatment or other patient management decisions. A negative result may occur with  improper specimen collection/handling, submission of specimen other than nasopharyngeal swab, presence of viral mutation(s) within the areas targeted by this assay, and inadequate number of viral copies(<138 copies/mL). A negative result must be combined with clinical observations, patient history, and epidemiological information. The expected result is  Negative.  Fact Sheet for Patients:  EntrepreneurPulse.com.au  Fact Sheet for Healthcare Providers:  IncredibleEmployment.be  This test is no t yet approved or cleared by the [place] FDA and  has been authorized for detection and/or diagnosis of SARS-CoV-2 by FDA under an Emergency Use Authorization (EUA). This EUA will remain  in effect (meaning this test can be used) for the duration of the COVID-19 declaration under Section 564(b)(1) of the Act, 21 U.S.C.section 360bbb-3(b)(1), unless the authorization is terminated  or revoked sooner.       Influenza A by PCR NEGATIVE NEGATIVE Final   Influenza B by PCR NEGATIVE NEGATIVE Final    Comment: (NOTE) The Xpert Xpress SARS-CoV-2/FLU/RSV plus assay is intended as an aid in the diagnosis of influenza from Nasopharyngeal swab specimens and should not be used as a sole basis for treatment. Nasal washings and aspirates are unacceptable for Xpert Xpress SARS-CoV-2/FLU/RSV testing.  Fact Sheet for Patients: EntrepreneurPulse.com.au  Fact Sheet for Healthcare Providers: IncredibleEmployment.be  This test is not yet approved or cleared by the [place] FDA and has been authorized for detection and/or diagnosis of SARS-CoV-2 by FDA under an Emergency Use Authorization (EUA). This EUA will remain in effect (meaning this test can be used) for the duration of the COVID-19 declaration under Section 564(b)(1) of the Act, 21 U.S.C. section 360bbb-3(b)(1), unless the authorization is terminated or revoked.  Performed at [facility], [address] [place], [place] [address]   Aerobic/Anaerobic Culture w Gram Stain (surgical/deep wound)  Status: None   Collection Time: 11/01/20  5:28 PM   Specimen: PATH Other; Tissue  Result Value Ref Range Status   Specimen Description WOUND RIGHT FOOT  Final   Special Requests 2ND TOE  Final   Gram Stain    Final    RARE WBC PRESENT, PREDOMINANTLY MONONUCLEAR ABUNDANT GRAM POSITIVE COCCI    Culture   Final    MODERATE STAPHYLOCOCCUS AUREUS ABUNDANT STREPTOCOCCUS AGALACTIAE TESTING AGAINST S. AGALACTIAE NOT ROUTINELY PERFORMED DUE TO PREDICTABILITY OF AMP/PEN/VAN SUSCEPTIBILITY. WITHIN MIXED ORGANISMS NO ANAEROBES ISOLATED Performed at [facility], [place] [address], [place], [place] [address]    Report Status 11/07/2020 FINAL  Final   Organism ID, Bacteria STAPHYLOCOCCUS AUREUS  Final      Susceptibility   Staphylococcus aureus - MIC*    CIPROFLOXACIN <=0.5 SENSITIVE Sensitive     ERYTHROMYCIN RESISTANT Resistant     GENTAMICIN <=0.5 SENSITIVE Sensitive     OXACILLIN 0.5 SENSITIVE Sensitive     TETRACYCLINE <=1 SENSITIVE Sensitive     VANCOMYCIN 1 SENSITIVE Sensitive     TRIMETH/SULFA <=10 SENSITIVE Sensitive     CLINDAMYCIN RESISTANT Resistant     RIFAMPIN <=0.5 SENSITIVE Sensitive     Inducible Clindamycin POSITIVE Resistant     * MODERATE STAPHYLOCOCCUS AUREUS      Management plans discussed with the patient, family and they are in agreement.  CODE STATUS:     Code Status Orders  (From admission, onward)           Start     Ordered   11/01/20 1143  Full code  Continuous        11/01/20 1144           Code Status History     This patient has a current code status but no historical code status.       TOTAL TIME TAKING CARE OF THIS PATIENT: 35 minutes.    Loletha Grayer M.D on 11/16/2020 at 5:51 PM  [facility]  CC: Primary care physician; Patient, No Pcp Per (Inactive)

## 2020-11-17 ENCOUNTER — Encounter: Payer: Self-pay | Admitting: Podiatry

## 2020-11-22 ENCOUNTER — Ambulatory Visit: Payer: Self-pay | Admitting: Podiatry

## 2020-11-23 ENCOUNTER — Other Ambulatory Visit: Payer: Self-pay

## 2020-11-23 ENCOUNTER — Ambulatory Visit
Admission: RE | Admit: 2020-11-23 | Discharge: 2020-11-23 | Disposition: A | Discharge status: Home / Self Care | Payer: Medicaid Other | Source: Ambulatory Visit | Attending: Infectious Diseases | Admitting: Infectious Diseases

## 2020-11-23 DIAGNOSIS — L089 Local infection of the skin and subcutaneous tissue, unspecified: Secondary | ICD-10-CM | POA: Insufficient documentation

## 2020-11-23 DIAGNOSIS — Z452 Encounter for adjustment and management of vascular access device: Secondary | ICD-10-CM | POA: Diagnosis not present

## 2020-11-23 LAB — CBC WITH DIFFERENTIAL/PLATELET
Abs Immature Granulocytes: 0.03 10*3/uL (ref 0.00–0.07)
Basophils Absolute: 0 10*3/uL (ref 0.0–0.1)
Basophils Relative: 0 %
Eosinophils Absolute: 0.2 10*3/uL (ref 0.0–0.5)
Eosinophils Relative: 2 %
HCT: 32.7 % — ABNORMAL LOW (ref 39.0–52.0)
Hemoglobin: 10.3 g/dL — ABNORMAL LOW (ref 13.0–17.0)
Immature Granulocytes: 0 %
Lymphocytes Relative: 31 %
Lymphs Abs: 2.1 10*3/uL (ref 0.7–4.0)
MCH: 28.9 pg (ref 26.0–34.0)
MCHC: 31.5 g/dL (ref 30.0–36.0)
MCV: 91.6 fL (ref 80.0–100.0)
Monocytes Absolute: 1 10*3/uL (ref 0.1–1.0)
Monocytes Relative: 15 %
Neutro Abs: 3.6 10*3/uL (ref 1.7–7.7)
Neutrophils Relative %: 52 %
Platelets: 453 10*3/uL — ABNORMAL HIGH (ref 150–400)
RBC: 3.57 MIL/uL — ABNORMAL LOW (ref 4.22–5.81)
RDW: 13.1 % (ref 11.5–15.5)
WBC: 6.9 10*3/uL (ref 4.0–10.5)
nRBC: 0 % (ref 0.0–0.2)

## 2020-11-23 LAB — COMPREHENSIVE METABOLIC PANEL
ALT: 8 U/L (ref 0–44)
AST: 15 U/L (ref 15–41)
Albumin: 3.2 g/dL — ABNORMAL LOW (ref 3.5–5.0)
Alkaline Phosphatase: 89 U/L (ref 38–126)
Anion gap: 7 (ref 5–15)
BUN: 17 mg/dL (ref 6–20)
CO2: 31 mmol/L (ref 22–32)
Calcium: 8.7 mg/dL — ABNORMAL LOW (ref 8.9–10.3)
Chloride: 100 mmol/L (ref 98–111)
Creatinine, Ser: 0.84 mg/dL (ref 0.61–1.24)
GFR, Estimated: >60 mL/min (ref >60)
Glucose, Bld: 172 mg/dL — ABNORMAL HIGH (ref 70–99)
Potassium: 3.9 mmol/L (ref 3.5–5.1)
Sodium: 138 mmol/L (ref 135–145)
Total Bilirubin: 0.6 mg/dL (ref 0.3–1.2)
Total Protein: 7.2 g/dL (ref 6.5–8.1)

## 2020-11-23 LAB — C-REACTIVE PROTEIN: CRP: 2.4 mg/dL — ABNORMAL HIGH (ref <1.0)

## 2020-11-23 LAB — SEDIMENTATION RATE: Sed Rate: 106 mm/hr — ABNORMAL HIGH (ref 0–20)

## 2020-11-23 MED ORDER — SODIUM CHLORIDE FLUSH 0.9 % IV SOLN
INTRAVENOUS | Status: AC
  Filled 2020-11-23: qty 20

## 2020-11-26 ENCOUNTER — Other Ambulatory Visit: Payer: Self-pay

## 2020-11-26 ENCOUNTER — Ambulatory Visit (INDEPENDENT_AMBULATORY_CARE_PROVIDER_SITE_OTHER): Payer: Self-pay | Admitting: Podiatry

## 2020-11-26 DIAGNOSIS — L97512 Non-pressure chronic ulcer of other part of right foot with fat layer exposed: Secondary | ICD-10-CM

## 2020-11-26 DIAGNOSIS — E08621 Diabetes mellitus due to underlying condition with foot ulcer: Secondary | ICD-10-CM

## 2020-11-30 ENCOUNTER — Other Ambulatory Visit: Payer: Self-pay

## 2020-11-30 ENCOUNTER — Ambulatory Visit
Admission: RE | Admit: 2020-11-30 | Discharge: 2020-11-30 | Disposition: A | Discharge status: Home / Self Care | Payer: Medicaid Other | Source: Ambulatory Visit | Attending: Infectious Diseases | Admitting: Infectious Diseases

## 2020-11-30 DIAGNOSIS — Z01812 Encounter for preprocedural laboratory examination: Secondary | ICD-10-CM | POA: Diagnosis not present

## 2020-11-30 LAB — COMPREHENSIVE METABOLIC PANEL
ALT: 7 U/L (ref 0–44)
AST: 14 U/L — ABNORMAL LOW (ref 15–41)
Albumin: 2.9 g/dL — ABNORMAL LOW (ref 3.5–5.0)
Alkaline Phosphatase: 78 U/L (ref 38–126)
Anion gap: 10 (ref 5–15)
BUN: 17 mg/dL (ref 6–20)
CO2: 30 mmol/L (ref 22–32)
Calcium: 8.6 mg/dL — ABNORMAL LOW (ref 8.9–10.3)
Chloride: 100 mmol/L (ref 98–111)
Creatinine, Ser: 0.64 mg/dL (ref 0.61–1.24)
GFR, Estimated: >60 mL/min (ref >60)
Glucose, Bld: 229 mg/dL — ABNORMAL HIGH (ref 70–99)
Potassium: 3.7 mmol/L (ref 3.5–5.1)
Sodium: 140 mmol/L (ref 135–145)
Total Bilirubin: 0.6 mg/dL (ref 0.3–1.2)
Total Protein: 7 g/dL (ref 6.5–8.1)

## 2020-11-30 LAB — CBC
HCT: 31.9 % — ABNORMAL LOW (ref 39.0–52.0)
Hemoglobin: 10.3 g/dL — ABNORMAL LOW (ref 13.0–17.0)
MCH: 29.4 pg (ref 26.0–34.0)
MCHC: 32.3 g/dL (ref 30.0–36.0)
MCV: 91.1 fL (ref 80.0–100.0)
Platelets: 371 10*3/uL (ref 150–400)
RBC: 3.5 MIL/uL — ABNORMAL LOW (ref 4.22–5.81)
RDW: 13 % (ref 11.5–15.5)
WBC: 6.4 10*3/uL (ref 4.0–10.5)
nRBC: 0 % (ref 0.0–0.2)

## 2020-11-30 LAB — C-REACTIVE PROTEIN: CRP: 2.4 mg/dL — ABNORMAL HIGH (ref <1.0)

## 2020-11-30 LAB — SEDIMENTATION RATE: Sed Rate: 70 mm/hr — ABNORMAL HIGH (ref 0–20)

## 2020-11-30 MED ORDER — HEPARIN SOD (PORK) LOCK FLUSH 100 UNIT/ML IV SOLN: 250.0000 [IU] | INTRAVENOUS | Status: AC | PRN

## 2020-11-30 MED ORDER — HEPARIN SOD (PORK) LOCK FLUSH 100 UNIT/ML IV SOLN
INTRAVENOUS | Status: AC
  Administered 2020-11-30: 250 [IU]
  Filled 2020-11-30: qty 5

## 2020-12-01 ENCOUNTER — Encounter: Payer: Medicaid Other | Attending: Internal Medicine | Admitting: Internal Medicine

## 2020-12-01 ENCOUNTER — Other Ambulatory Visit (HOSPITAL_COMMUNITY): Payer: Self-pay | Admitting: Internal Medicine

## 2020-12-01 ENCOUNTER — Other Ambulatory Visit: Payer: Self-pay | Admitting: Internal Medicine

## 2020-12-01 DIAGNOSIS — M86171 Other acute osteomyelitis, right ankle and foot: Secondary | ICD-10-CM | POA: Insufficient documentation

## 2020-12-01 DIAGNOSIS — S91301A Unspecified open wound, right foot, initial encounter: Secondary | ICD-10-CM

## 2020-12-01 DIAGNOSIS — F17200 Nicotine dependence, unspecified, uncomplicated: Secondary | ICD-10-CM | POA: Insufficient documentation

## 2020-12-01 DIAGNOSIS — Z794 Long term (current) use of insulin: Secondary | ICD-10-CM | POA: Insufficient documentation

## 2020-12-01 DIAGNOSIS — E11621 Type 2 diabetes mellitus with foot ulcer: Secondary | ICD-10-CM | POA: Diagnosis not present

## 2020-12-01 NOTE — Progress Notes (Signed)
RAHM, MINIX ([id number]) Visit Report for 12/01/2020 Abuse/Suicide Risk Screen Details Patient Name: Fernando Vega, Fernando Vega. Date of Service: 12/01/2020 10:15 AM Medical Record Number: [id number] Patient Account Number: [id number] Date of Birth/Sex: 01/19/1971 (50 y.o. M) Treating RN: Donnamarie Poag Primary Care Itzae Mccurdy: SYSTEM, PCP Other Clinician: Referring Emme Rosenau: Loletha Grayer Treating Daurice Ovando/Extender: Yaakov Guthrie in Treatment: 0 Abuse/Suicide Risk Screen Items Answer ABUSE RISK SCREEN: Has anyone close to you tried to hurt or harm you recentlyo No Do you feel uncomfortable with anyone in your familyo No Has anyone forced you do things that you didnot want to doo No Electronic Signature(s) Signed: 12/01/2020 3:15:27 PM By: Donnamarie Poag Entered By: Donnamarie Poag on 12/01/2020 10:22:45 Fernando Vega ([id number]) -------------------------------------------------------------------------------- Activities of Daily Living Details Patient Name: Fernando Vega. Date of Service: 12/01/2020 10:15 AM Medical Record Number: [id number] Patient Account Number: [id number] Date of Birth/Sex: 1970-03-20 (50 y.o. M) Treating RN: Donnamarie Poag Primary Care Aldina Porta: SYSTEM, PCP Other Clinician: Referring Reveca Desmarais: Loletha Grayer Treating Carle Fenech/Extender: Yaakov Guthrie in Treatment: 0 Activities of Daily Living Items Answer Activities of Daily Living (Please select one for each item) Drive Automobile Completely Able Take Medications Completely Able Use Telephone Completely Able Care for Appearance Completely Able Use Toilet Completely Able Bath / Shower Completely Able Dress Self Completely Able Feed Self Completely Able Walk Completely Able Get In / Out Bed Completely Able Housework Completely Able Prepare Meals Completely Able Handle Money Completely Able Shop for Self Completely Able Electronic Signature(s) Signed: 12/01/2020 3:15:27 PM By: Donnamarie Poag Entered By:  Donnamarie Poag on 12/01/2020 10:20:52 Fernando Vega ([id number]) -------------------------------------------------------------------------------- Education Screening Details Patient Name: Fernando Vega. Date of Service: 12/01/2020 10:15 AM Medical Record Number: [id number] Patient Account Number: [id number] Date of Birth/Sex: Jun 29, 1970 (50 y.o. M) Treating RN: Donnamarie Poag Primary Care Lillieanna Tuohy: SYSTEM, PCP Other Clinician: Referring Verginia Toohey: Loletha Grayer Treating Kaydie Petsch/Extender: Yaakov Guthrie in Treatment: 0 Primary Learner Assessed: Patient Learning Preferences/Education Level/Primary Language Learning Preference: Explanation Highest Education Level: Grade School Preferred Language: English Cognitive Barrier Language Barrier: No Translator Needed: No Memory Deficit: No Emotional Barrier: No Cultural/Religious Beliefs Affecting Medical Care: No Physical Barrier Impaired Vision: No Impaired Hearing: No Decreased Hand dexterity: No Knowledge/Comprehension Knowledge Level: High Comprehension Level: High Ability to understand written instructions: High Ability to understand verbal instructions: High Motivation Anxiety Level: Calm Cooperation: Cooperative Education Importance: Acknowledges Need Interest in Health Problems: Asks Questions Perception: Coherent Willingness to Engage in Self-Management High Activities: Readiness to Engage in Self-Management High Activities: Electronic Signature(s) Signed: 12/01/2020 3:15:27 PM By: Donnamarie Poag Entered ByDonnamarie Poag on 12/01/2020 10:22:00 Fernando Vega ([id number]) -------------------------------------------------------------------------------- Fall Risk Assessment Details Patient Name: Fernando Vega. Date of Service: 12/01/2020 10:15 AM Medical Record Number: [id number] Patient Account Number: [id number] Date of Birth/Sex: 03-Aug-1970 (50 y.o. M) Treating RN: Donnamarie Poag Primary Care Kayleah Appleyard: SYSTEM, PCP  Other Clinician: Referring Mylissa Lambe: Loletha Grayer Treating Kashira Behunin/Extender: Yaakov Guthrie in Treatment: 0 Fall Risk Assessment Items Have you had 2 or more falls in the last 12 monthso 0 No Have you had any fall that resulted in injury in the last 12 monthso 0 No FALLS RISK SCREEN History of falling - immediate or within 3 months 0 No Secondary diagnosis (Do you have 2 or more medical diagnoseso) 0 No Ambulatory aid None/bed rest/wheelchair/nurse 0 Yes Crutches/cane/walker 0 No Furniture 0 No Intravenous therapy Access/Saline/Heparin Lock 0 No Gait/Transferring Normal/ bed rest/ wheelchair 0 Yes Weak (short steps with or without shuffle, stooped  but able to lift head while walking, may 0 No seek support from furniture) Impaired (short steps with shuffle, may have difficulty arising from chair, head down, impaired 0 No balance) Mental Status Oriented to own ability 0 Yes Electronic Signature(s) Signed: 12/01/2020 3:15:27 PM By: Donnamarie Poag Entered By: Donnamarie Poag on 12/01/2020 10:22:18 Fernando Vega ([id number]) -------------------------------------------------------------------------------- Foot Assessment Details Patient Name: Fernando Vega. Date of Service: 12/01/2020 10:15 AM Medical Record Number: [id number] Patient Account Number: [id number] Date of Birth/Sex: 20-Feb-1971 (50 y.o. M) Treating RN: Donnamarie Poag Primary Care Demetric Dunnaway: SYSTEM, PCP Other Clinician: Referring Brenin Heidelberger: Loletha Grayer Treating Ayelet Gruenewald/Extender: Yaakov Guthrie in Treatment: 0 Foot Assessment Items Site Locations + = Sensation present, - = Sensation absent, C = Callus, U = Ulcer R = Redness, W = Warmth, M = Maceration, PU = Pre-ulcerative lesion F = Fissure, S = Swelling, D = Dryness Assessment Right: Left: Other Deformity: No No Prior Foot Ulcer: No No Prior Amputation: Yes No Charcot Joint: No No Ambulatory Status: Ambulatory Without Help Gait: Steady Electronic  Signature(s) Signed: 12/01/2020 3:15:27 PM By: Donnamarie Poag Entered By: Donnamarie Poag on 12/01/2020 10:31:03 Fernando Vega ([id number]) -------------------------------------------------------------------------------- Nutrition Risk Screening Details Patient Name: Fernando Vega. Date of Service: 12/01/2020 10:15 AM Medical Record Number: [id number] Patient Account Number: [id number] Date of Birth/Sex: May 14, 1970 (50 y.o. M) Treating RN: Donnamarie Poag Primary Care Tacy Chavis: SYSTEM, PCP Other Clinician: Referring Jehan Bonano: Loletha Grayer Treating Tyleah Loh/Extender: Yaakov Guthrie in Treatment: 0 Height (in): 67 Weight (lbs): 183 Body Mass Index (BMI): 28.7 Nutrition Risk Screening Items Score Screening NUTRITION RISK SCREEN: I have an illness or condition that made me change the kind and/or amount of food I eat 0 No I eat fewer than two meals per day 0 No I eat few fruits and vegetables, or milk products 0 No I have three or more drinks of beer, liquor or wine almost every day 0 No I have tooth or mouth problems that make it hard for me to eat 0 No I don't always have enough money to buy the food I need 0 No I eat alone most of the time 0 No I take three or more different prescribed or over-the-counter drugs a day 1 Yes Without wanting to, I have lost or gained 10 pounds in the last six months 0 No I am not always physically able to shop, cook and/or feed myself 0 No Nutrition Protocols Good Risk Protocol 0 No interventions needed Moderate Risk Protocol High Risk Proctocol Risk Level: Good Risk Score: 1 Electronic Signature(s) Signed: 12/01/2020 3:15:27 PM By: Donnamarie Poag Entered ByDonnamarie Poag on 12/01/2020 10:22:36

## 2020-12-01 NOTE — Progress Notes (Signed)
Fernando Vega, Fernando Vega ([id number]) Visit Report for 12/01/2020 Allergy List Details Patient Name: Fernando Vega, Fernando Vega. Date of Service: 12/01/2020 10:15 AM Medical Record Number: [id number] Patient Account Number: [id number] Date of Birth/Sex: Apr 04, 1970 (50 y.o. M) Treating RN: Fernando Vega Primary Care Fernando Vega: SYSTEM, PCP Other Clinician: Referring Fernando Vega: Fernando Vega Treating Fernando Vega/Extender: Fernando Vega in Treatment: 0 Allergies Active Allergies No Known Allergies Allergy Notes Electronic Signature(s) Signed: 12/01/2020 3:15:27 PM By: Fernando Vega Entered By: Fernando Vega on 12/01/2020 10:20:14 Fernando Vega ([id number]) -------------------------------------------------------------------------------- [place] Information Details Patient Name: Fernando Vega Date of Service: 12/01/2020 10:15 AM Medical Record Number: [id number] Patient Account Number: [id number] Date of Birth/Sex: Oct 31, 1970 (50 y.o. M) Treating RN: Fernando Vega Primary Care Fernando Vega: SYSTEM, PCP Other Clinician: Referring Fernando Vega: Fernando Vega Treating Dewon Mendizabal/Extender: Fernando Vega in Treatment: 0 Visit Information Patient Arrived: Ambulatory Arrival Time: 10:16 Accompanied By: self Transfer Assistance: None Patient Identification Verified: Yes Secondary Verification Process Completed: Yes Patient Has Alerts: Yes Patient Alerts: DIABETIC PICC right arm follows with ID Electronic Signature(s) Signed: 12/01/2020 1:16:23 PM By: Fernando Vega Previous Signature: 12/01/2020 1:16:02 PM Version By: Fernando Vega Entered By: Fernando Vega on 12/01/2020 13:16:23 Fernando Vega ([id number]) -------------------------------------------------------------------------------- Clinic Level of Care Assessment Details Patient Name: Fernando Vega. Date of Service: 12/01/2020 10:15 AM Medical Record Number: [id number] Patient Account Number: [id number] Date of Birth/Sex: [birth year] (50 y.o. M) Treating RN:  Fernando Vega Primary Care Charlii Yost: SYSTEM, PCP Other Clinician: Referring Fernando Vega: Fernando Vega Treating Yovani Cogburn/Extender: Fernando Vega in Treatment: 0 Clinic Level of Care Assessment Items TOOL 2 Quantity Score []  - Use when only an EandM is performed on the INITIAL visit 0 ASSESSMENTS - Nursing Assessment / Reassessment X - General Physical Exam (combine w/ comprehensive assessment (listed just below) when performed on new 1 20 pt. evals) []  - 0 Comprehensive Assessment (HX, ROS, Risk Assessments, Wounds Hx, etc.) ASSESSMENTS - Wound and Skin Assessment / Reassessment X - Simple Wound Assessment / Reassessment - one wound 1 5 []  - 0 Complex Wound Assessment / Reassessment - multiple wounds []  - 0 Dermatologic / Skin Assessment (not related to wound area) ASSESSMENTS - Ostomy and/or Continence Assessment and Care []  - Incontinence Assessment and Management 0 []  - 0 Ostomy Care Assessment and Management (repouching, etc.) PROCESS - Coordination of Care X - Simple Patient / Family Education for ongoing care 1 15 []  - 0 Complex (extensive) Patient / Family Education for ongoing care []  - 0 Staff obtains Programmer, systems, Records, Test Results / Process Orders []  - 0 Staff telephones HHA, Nursing Homes / Clarify orders / etc []  - 0 Routine Transfer to another Facility (non-emergent condition) []  - 0 Routine Hospital Admission (non-emergent condition) []  - 0 New Admissions / Biomedical engineer / Ordering NPWT, Apligraf, etc. []  - 0 Emergency Hospital Admission (emergent condition) X- 1 10 Simple Discharge Coordination []  - 0 Complex (extensive) Discharge Coordination PROCESS - Special Needs []  - Pediatric / Minor Patient Management 0 []  - 0 Isolation Patient Management []  - 0 Hearing / Language / Visual special needs []  - 0 Assessment of Community assistance (transportation, D/C planning, etc.) []  - 0 Additional assistance / Altered mentation []  -  0 Support Surface(s) Assessment (bed, cushion, seat, etc.) INTERVENTIONS - Wound Cleansing / Measurement X - Wound Imaging (photographs - any number of wounds) 1 5 []  - 0 Wound Tracing (instead of photographs) X- 1 5 Simple Wound Measurement - one wound []  - 0 Complex Wound Measurement - multiple  wounds [name], [name] ([id number]) []  - 0 Simple Wound Cleansing - one wound []  - 0 Complex Wound Cleansing - multiple wounds INTERVENTIONS - Wound Dressings X - Small Wound Dressing one or multiple wounds 1 10 []  - 0 Medium Wound Dressing one or multiple wounds []  - 0 Large Wound Dressing one or multiple wounds []  - 0 Application of Medications - injection INTERVENTIONS - Miscellaneous []  - External ear exam 0 []  - 0 Specimen Collection (cultures, biopsies, blood, body fluids, etc.) []  - 0 Specimen(s) / Culture(s) sent or taken to Lab for analysis []  - 0 Patient Transfer (multiple staff / Civil Service fast streamer / Similar devices) []  - 0 Simple Staple / Suture removal (25 or less) []  - 0 Complex Staple / Suture removal (26 or more) []  - 0 Hypo / Hyperglycemic Management (close monitor of Blood Glucose) X- 1 15 Ankle / Brachial Index (ABI) - do not check if billed separately Has the patient been seen at the hospital within the last three years: Yes Total Score: 85 Level Of Care: New/Established - Level 3 Electronic Signature(s) Signed: 12/01/2020 3:15:27 PM By: Fernando Vega Entered By: Fernando Vega on 12/01/2020 12:09:03 Fernando Vega ([id number]) -------------------------------------------------------------------------------- Encounter Discharge Information Details Patient Name: Fernando Vega. Date of Service: 12/01/2020 10:15 AM Medical Record Number: [id number] Patient Account Number: [id number] Date of Birth/Sex: [birth year] (50 y.o. M) Treating RN: Fernando Vega Primary Care Edenilson Austad: SYSTEM, PCP Other Clinician: Referring Mikena Masoner: Fernando Vega Treating Lauralei Clouse/Extender:  Fernando Vega in Treatment: 0 Encounter Discharge Information Items Post Procedure Vitals Discharge Condition: Stable Temperature (F): 97.9 Ambulatory Status: Ambulatory Pulse (bpm): 87 Discharge Destination: Home Respiratory Rate (breaths/min): 16 Transportation: Private Auto Blood Pressure (mmHg): 145/81 Accompanied By: self Schedule Follow-up Appointment: Yes Clinical Summary of Care: Electronic Signature(s) Signed: 12/01/2020 3:15:27 PM By: Fernando Vega Entered By: Fernando Vega on 12/01/2020 12:10:08 Fernando Vega ([id number]) -------------------------------------------------------------------------------- Lower Extremity Assessment Details Patient Name: Fernando Vega. Date of Service: 12/01/2020 10:15 AM Medical Record Number: [id number] Patient Account Number: [id number] Date of Birth/Sex: 18-Mar-1970 (50 y.o. M) Treating RN: Fernando Vega Primary Care Marshelle Bilger: SYSTEM, PCP Other Clinician: Referring Everley Evora: Fernando Vega Treating Tyeesha Riker/Extender: Fernando Vega in Treatment: 0 Edema Assessment Assessed: [Left: No] [Right: Yes] Edema: [Left: N] [Right: o] Calf Left: Right: Point of Measurement: 32 cm From Medial Instep 35.5 cm Ankle Left: Right: Point of Measurement: 10 cm From Medial Instep 23.5 cm Knee To Floor Left: Right: From Medial Instep 41 cm Vascular Assessment Pulses: Dorsalis Pedis Palpable: [Right:Yes] Blood Pressure: Brachial: [Right:216] Ankle: [Right:Dorsalis Pedis: 160 0.74] Electronic Signature(s) Signed: 12/01/2020 3:15:27 PM By: Fernando Vega Entered By: Fernando Vega on 12/01/2020 11:04:47 Fernando Vega ([id number]) -------------------------------------------------------------------------------- Multi Wound Chart Details Patient Name: Fernando Vega. Date of Service: 12/01/2020 10:15 AM Medical Record Number: [id number] Patient Account Number: [id number] Date of Birth/Sex: August 12, 1970 (50 y.o. M) Treating RN: Fernando Vega Primary Care Erleen Egner: SYSTEM, PCP Other Clinician: Referring Maddox Hlavaty: Fernando Vega Treating Jaci Desanto/Extender: Fernando Vega in Treatment: 0 Vital Signs Height(in): 67 Pulse(bpm): 87 Weight(lbs): 183 Blood Pressure(mmHg): 145/81 Body Mass Index(BMI): 29 Temperature(F): 97.9 Respiratory Rate(breaths/min): 16 Photos: [N/A:N/A] Wound Location: Right, Distal Foot N/A N/A Wounding Event: Puncture N/A N/A Primary Etiology: Open Surgical Wound N/A N/A Secondary Etiology: Diabetic Wound/Ulcer of the Lower N/A N/A Extremity Comorbid History: Anemia, Sleep Apnea, Type II N/A N/A Diabetes Date Acquired: 10/11/2020 N/A N/A Weeks of Treatment: 0 N/A N/A Wound Status: Open N/A N/A Pending Amputation on Yes N/A N/A  Presentation: Measurements L x W x D (cm) 6x4x4.2 N/A N/A Area (cm) : 18.85 N/A N/A Volume (cm) : 79.168 N/A N/A % Reduction in Area: 0.00% N/A N/A % Reduction in Volume: 0.00% N/A N/A Starting Position 1 (o'clock): 2 Ending Position 1 (o'clock): 3 Maximum Distance 1 (cm): 1 Starting Position 2 (o'clock): 9 Ending Position 2 (o'clock): 9 Maximum Distance 2 (cm): 0.5 Undermining: Yes N/A N/A Classification: Full Thickness With Exposed N/A N/A Support Structures Exudate Amount: Large N/A N/A Exudate Type: Serosanguineous N/A N/A Exudate Color: red, brown N/A N/A Wound Margin: Thickened N/A N/A Granulation Amount: Medium (34-66%) N/A N/A KANON, NOVOSEL ([id number]) Granulation Quality: Red, Pink N/A N/A Necrotic Amount: Medium (34-66%) N/A N/A Necrotic Tissue: Eschar, Adherent Slough N/A N/A Exposed Structures: Fat Layer (Subcutaneous Tissue): N/A N/A Yes Tendon: Yes Muscle: Yes Joint: Yes Bone: Yes Debridement: Chemical/Enzymatic/Mechanical N/A N/A Pre-procedure Verification/Time 11:17 N/A N/A Out Taken: Instrument: Other(santyl) N/A N/A Bleeding: None N/A N/A Debridement Treatment Procedure was tolerated well N/A N/A Response: Post  Debridement 6x4x4.2 N/A N/A Measurements L x W x D (cm) Post Debridement Volume: 79.168 N/A N/A (cm) Procedures Performed: Debridement N/A N/A Treatment Notes Electronic Signature(s) Signed: 12/01/2020 12:40:14 PM By: Kalman Shan DO Entered By: Kalman Shan on 12/01/2020 11:56:33 Fernando Vega ([id number]) -------------------------------------------------------------------------------- [place] Details Patient Name: Fernando Vega. Date of Service: 12/01/2020 10:15 AM Medical Record Number: [id number] Patient Account Number: [id number] Date of Birth/Sex: 04-Feb-1971 (50 y.o. M) Treating RN: Fernando Vega Primary Care Shaynna Husby: SYSTEM, PCP Other Clinician: Referring Claudetta Sallie: Fernando Vega Treating Gianni Fuchs/Extender: Fernando Vega in Treatment: 0 Active Inactive Orientation to the Wound Care Program Nursing Diagnoses: Knowledge deficit related to the wound healing center program Goals: Patient/caregiver will verbalize understanding of the [facility] Program Date Initiated: 12/01/2020 Target Resolution Date: 12/10/2020 Goal Status: Active Interventions: Provide education on orientation to the wound center Notes: Wound/Skin Impairment Nursing Diagnoses: Impaired tissue integrity Knowledge deficit related to smoking impact on wound healing Knowledge deficit related to ulceration/compromised skin integrity Goals: Patient/caregiver will verbalize understanding of skin care regimen Date Initiated: 12/01/2020 Target Resolution Date: 12/10/2020 Goal Status: Active Ulcer/skin breakdown will have a volume reduction of 30% by week 4 Date Initiated: 12/01/2020 Target Resolution Date: 12/31/2020 Goal Status: Active Ulcer/skin breakdown will have a volume reduction of 50% by week 8 Date Initiated: 12/01/2020 Target Resolution Date: 01/31/2021 Goal Status: Active Ulcer/skin breakdown will have a volume reduction of 80% by week 12 Date  Initiated: 12/01/2020 Target Resolution Date: 03/02/2021 Goal Status: Active Interventions: Assess patient/caregiver ability to obtain necessary supplies Assess patient/caregiver ability to perform ulcer/skin care regimen upon admission and as needed Assess ulceration(s) every visit Notes: Electronic Signature(s) Signed: 12/01/2020 3:15:27 PM By: Fernando Vega Entered By: Fernando Vega on 12/01/2020 11:06:14 Fernando Vega ([id number]) -------------------------------------------------------------------------------- Pain Assessment Details Patient Name: Fernando Vega. Date of Service: 12/01/2020 10:15 AM Medical Record Number: [id number] Patient Account Number: [id number] Date of Birth/Sex: 13-Jun-1970 (50 y.o. M) Treating RN: Fernando Vega Primary Care Tenasia Aull: SYSTEM, PCP Other Clinician: Referring Doniven Vanpatten: Fernando Vega Treating Makailah Slavick/Extender: Fernando Vega in Treatment: 0 Active Problems Location of Pain Severity and Description of Pain Patient Has Paino No Site Locations Rate the pain. Current Pain Level: 0 Pain Management and Medication Current Pain Management: Electronic Signature(s) Signed: 12/01/2020 3:15:27 PM By: Fernando Vega Entered By: Fernando Vega on 12/01/2020 10:19:27 Fernando Vega ([id number]) -------------------------------------------------------------------------------- Patient/Caregiver Education Details Patient Name: Fernando Vega Date of Service: 12/01/2020 10:15 AM  Medical Record Number: [id number] Patient Account Number: [id number] Date of Birth/Gender: 12-26-1970 (50 y.o. M) Treating RN: Fernando Vega Primary Care Physician: SYSTEM, PCP Other Clinician: Referring Physician: Loletha Vega Treating Physician/Extender: Fernando Vega in Treatment: 0 Education Assessment Education Provided To: Patient Education Topics Provided Basic Hygiene: Welcome To [facility]: Wound Debridement: Wound/Skin Impairment: Electronic  Signature(s) Signed: 12/01/2020 3:15:27 PM By: Fernando Vega Entered By: Fernando Vega on 12/01/2020 12:09:22 Fernando Vega ([id number]) -------------------------------------------------------------------------------- Wound Assessment Details Patient Name: Fernando Vega. Date of Service: 12/01/2020 10:15 AM Medical Record Number: [id number] Patient Account Number: [id number] Date of Birth/Sex: 1970/10/03 (50 y.o. M) Treating RN: Fernando Vega Primary Care Nolon Yellin: SYSTEM, PCP Other Clinician: Referring Aubrina Nieman: Fernando Vega Treating Khloie Hamada/Extender: Fernando Vega in Treatment: 0 Wound Status Wound Number: 1 Primary Etiology: Open Surgical Wound Wound Location: Right, Distal Foot Secondary Etiology: Diabetic Wound/Ulcer of the Lower Extremity Wounding Event: Puncture Wound Status: Open Date Acquired: 10/11/2020 Comorbid History: Anemia, Sleep Apnea, Type II Diabetes Weeks Of Treatment: 0 Clustered Wound: No Photos Wound Measurements Length: (cm) 6 Width: (cm) 4 Depth: (cm) 4.2 Area: (cm) 18.85 Volume: (cm) 79.168 % Reduction in Area: 0% % Reduction in Volume: 0% Tunneling: No Undermining: Yes Location 1 Starting Position (o'clock): 2 Ending Position (o'clock): 3 Maximum Distance: (cm) 1 Location 2 Starting Position (o'clock): 9 Ending Position (o'clock): 9 Maximum Distance: (cm) 0.5 Wound Description Classification: Full Thickness With Exposed Support Structures Wound Margin: Thickened Exudate Amount: Large Exudate Type: Serosanguineous Exudate Color: red, brown Foul Odor After Cleansing: No Slough/Fibrino Yes Wound Bed Granulation Amount: Medium (34-66%) Exposed Structure Granulation Quality: Red, Pink Fat Layer (Subcutaneous Tissue) Exposed: Yes Necrotic Amount: Medium (34-66%) Tendon Exposed: Yes Necrotic Quality: Eschar, Adherent Slough Muscle Exposed: Yes Necrosis of Muscle: No Joint Exposed: Yes Bone Exposed: Yes Electronic  Signature(s) DVAUGHN, FICKLE ([id number]) Signed: 12/01/2020 3:15:27 PM By: Fernando Vega Entered By: Fernando Vega on 12/01/2020 10:55:26 Fernando Vega ([id number]) -------------------------------------------------------------------------------- Vitals Details Patient Name: Fernando Vega. Date of Service: 12/01/2020 10:15 AM Medical Record Number: [id number] Patient Account Number: [id number] Date of Birth/Sex: 1970/10/15 (50 y.o. M) Treating RN: Fernando Vega Primary Care Dacari Beckstrand: SYSTEM, PCP Other Clinician: Referring Genasis Zingale: Fernando Vega Treating Robbie Rideaux/Extender: Fernando Vega in Treatment: 0 Vital Signs Time Taken: 10:15 Temperature (F): 97.9 Height (in): 67 Pulse (bpm): 87 Source: Stated Respiratory Rate (breaths/min): 16 Weight (lbs): 183 Blood Pressure (mmHg): 145/81 Source: Measured Reference Range: 80 - 120 mg / dl Body Mass Index (BMI): 28.7 Electronic Signature(s) Signed: 12/01/2020 3:15:27 PM By: Fernando Vega Entered ByDonnamarie Vega on 12/01/2020 10:19:56

## 2020-12-01 NOTE — Progress Notes (Signed)
YOVANY, CLOCK ([id number]) Visit Report for 12/01/2020 Chief Complaint Document Details Patient Name: Fernando Vega, Fernando Vega. Date of Service: 12/01/2020 10:15 AM Medical Record Number: [id number] Patient Account Number: [id number] Date of Birth/Sex: [birth year] (50 y.o. 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 Date of Service: 12/01/2020 10:15 AM Medical Record Number: [id number] Patient Account Number: [id number] Date of Birth/Sex: Dec 25, 1970 (50 y.o. M) Treating RN: Donnamarie Poag Primary Care Provider: SYSTEM, PCP Other Clinician: Referring Provider: Loletha Grayer Treating Provider/Extender: Yaakov Guthrie in Treatment: 0 Debridement Performed for Wound #1 Right,Distal Foot Assessment: Performed By: Physician Kalman Shan, MD Debridement Type: Chemical/Enzymatic/Mechanical Agent Used: Santyl Severity of Tissue Pre Debridement: Bone involvement without necrosis Level of Consciousness (Pre- Awake and Alert procedure): Pre-procedure Verification/Time Out Yes - 11:17 Taken: Start Time: 11:17 Instrument: Other : santyl Bleeding: None Response to Treatment: Procedure was tolerated well Level of Consciousness (Post- Awake and Alert procedure): Post Debridement Measurements of Total Wound Length: (cm) 6 Width: (cm) 4 Depth: (cm) 4.2 Volume: (cm) 79.168 Character of Wound/Ulcer Post  Debridement: Improved Severity of Tissue Post Debridement: Bone involvement without necrosis Post Procedure Diagnosis Same as Pre-procedure Electronic Signature(s) Signed: 12/01/2020 12:40:14 PM By: Kalman Shan DO Signed: 12/01/2020 3:15:27 PM By: Donnamarie Poag Entered By: Donnamarie Poag on 12/01/2020 11:17:54 Fernando Vega ([id number]) -------------------------------------------------------------------------------- HPI Details Patient Name: Fernando Vega. Date of Service: 12/01/2020 10:15 AM Medical Record Number: [id number] Patient Account Number: [id number] Date of Birth/Sex: 1970-05-28 (50 y.o. M) Treating RN: Donnamarie Poag Primary Care Provider: SYSTEM, PCP Other Clinician: Referring Provider: Loletha Grayer Treating Provider/Extender: Yaakov Guthrie in Treatment: 0 History of Present Illness HPI Description: Admission 12/01/2020 Fernando Vega is a 50 year old male with a past medical history of uncontrolled type 2 diabetes on insulin that presents to the clinic for a right foot wound. On 8/22 patient was admitted into the hospital for necrotizing cellulitis due to a puncture wound. He had an amputation of the second and third ray. Bone culture showed acute osteomyelitis. He was placed on Ancef for 6 weeks. He has been using Betadine to the wound bed. He reports stability in wound healing. Patient's hemoglobin A1c is 11.5. Patient currently denies systemic signs of infection. Electronic Signature(s) Signed: 12/01/2020 12:40:14 PM By: Kalman Shan DO Entered By: Kalman Shan on 12/01/2020 12:03:17 Fernando Vega ([id number]) -------------------------------------------------------------------------------- Physical Exam Details Patient Name: Fernando Vega Date of Service: 12/01/2020 10:15 AM Medical Record Number: [id number] Patient Account Number: [id number] Date of Birth/Sex: 1970/10/08 (50 y.o. M) Treating RN: Donnamarie Poag Primary Care Provider: SYSTEM, PCP Other  Clinician: Referring Provider: Loletha Grayer Treating Provider/Extender: Yaakov Guthrie in Treatment: 0 Constitutional . Cardiovascular . Psychiatric . Notes Right foot: Ray amputation of second and third digit. Open wound with nonviable tissue throughout. There is exposed bone that is visible on exam. There is increased warmth and erythema to the dorsal and plantar aspect. Electronic Signature(s) Signed: 12/01/2020 12:40:14 PM By: Kalman Shan DO Entered By: Kalman Shan on 12/01/2020 12:05:02 Fernando Vega ([id number]) -------------------------------------------------------------------------------- Physician Orders Details Patient Name: Fernando Vega. Date of Service: 12/01/2020 10:15  AM Medical Record Number: [id number] Patient Account Number: [id number] Date of Birth/Sex: 10/20/1970 (50 y.o. M) Treating RN: Donnamarie Poag Primary Care Provider: SYSTEM, PCP Other Clinician: Referring Provider: Loletha Grayer Treating Provider/Extender: Yaakov Guthrie in Treatment: 0 Verbal / Phone Orders: No Diagnosis Coding Follow-up Appointments o Return Appointment in 1 week. Bathing/ Shower/ Hygiene o Clean wound with Normal Saline or wound cleanser. o May shower with wound dressing protected with water repellent cover or cast protector. o No tub bath. Off-Loading o Open toe surgical shoe Medications-Please add to medication list. o P.O. Antibiotics - Pick up the antibiotic at [facility] to start taking by mouth-we will send it in. Pick up the Santyl ointment at [facility] and take the coupon with you that we gave you for the wound care o Other: - continue IV antibiotics as ordered and handled by Infectious Disease and keep appt with Infectious Disease 10/06/20; continue to follow with [facility], Dr. Amalia Hailey Stop using the Betadine Wound Treatment Wound #1 - Foot Wound Laterality: Right, Distal Cleanser: Byram Ancillary Kit - 15 Day Supply (DME)  (Generic) 1 x Per Day/30 Days Discharge Instructions: Use supplies as instructed; Kit contains: (15) Saline Bullets; (15) 3x3 Gauze; 15 pr Gloves Cleanser: Wound Cleanser 1 x Per Day/30 Days Discharge Instructions: Wash your hands with soap and water. Remove old dressing, discard into plastic bag and place into trash. Cleanse the wound with Wound Cleanser prior to applying a clean dressing using gauze sponges, not tissues or cotton balls. Do not scrub or use excessive force. Pat dry using gauze sponges, not tissue or cotton balls. Topical: Santyl Collagenase Ointment, 30 (gm), tube 1 x Per Day/30 Days Discharge Instructions: apply nickel thick to wound bed only Primary Dressing: SILVERCEL Antimicrobial Alginate Dressing, 1x12 (in/in) 1 x Per Day/30 Days Secondary Dressing: ABD Pad 5x9 (in/in) 1 x Per Day/30 Days Discharge Instructions: Cover with ABD pad Secured With: 51M Medipore H Soft Cloth Surgical Tape, 2x2 (in/yd) 1 x Per Day/30 Days Secured With: Conforming Stretch Gauze Bandage 4x75 (in/in) 1 x Per Day/30 Days Discharge Instructions: Apply as directed Radiology o MRI with and without Contrast - right foot-they will call you for MRI of right foot-we will send the order Patient Medications Allergies: No Known Allergies Notifications Medication Indication Start End Santyl apply to yellow areas 12/01/2020 of right foot daily wound DOSE topical 250 unit/gram ointment - ointment topical clindamycin HCl 12/01/2020 Fernando Vega ([id number]) Notifications Medication Indication Start End DOSE 1 - oral 300 mg capsule - 1 capsule oral QID x 7 days Electronic Signature(s) Signed: 12/01/2020 12:15:11 PM By: Kalman Shan DO Entered By: Kalman Shan on 12/01/2020 12:15:10 Fernando Vega ([id number]) -------------------------------------------------------------------------------- Prescription 12/01/2020 Patient Name: Fernando Vega. Provider: Kalman Shan MD Date of Birth:  [birth year] NPI#: [id number] Sex: M DEA#: Phone #: [phone] License #: [id number] Patient Address: [place], [place] [address] [address], [place],  [address] [phone] Allergies No Known Allergies Medication Medication: Route: Strength: Form: Santyl 250 unit/gram topical ointment topical 250 unit/gram ointment Class: TOPICAL/MUCOUS MEMBR./SUBCUT. ENZYMES Dose: Frequency / Time: Indication: ointment topical apply to yellow areas of right foot daily wound Number of Refills: Number of Units: 0 Sixty (60) Generic Substitution: Start Date: End Date: One Time Use: Substitution Permitted 1/74/9449 No Note to Pharmacy: Wound size 6cm x 4cm x 4.2cm with undermining -- Manufacturer's Note: The total quantity has been calculated in grams  due to Santyl being available in either 30 or 90 gram tubes. Hand Signature: Date(s): Electronic Signature(s) Signed: 12/01/2020 12:40:14 PM By: Kalman Shan DO Entered By: Kalman Shan on 12/01/2020 12:15:11 Fernando Vega ([id number]) --------------------------------------------------------------------------------  Problem List Details Patient Name: Fernando Vega. Date of Service: 12/01/2020 10:15 AM Medical Record Number: [id number] Patient Account Number: [id number] Date of Birth/Sex: 1970-08-26 (50 y.o. M) Treating RN: Donnamarie Poag Primary Care Provider: SYSTEM, PCP Other Clinician: Referring Provider: Loletha Grayer Treating Provider/Extender: Yaakov Guthrie in Treatment: 0 Active Problems ICD-10 Encounter Code Description Active Date MDM Diagnosis M86.171 Other acute osteomyelitis, right ankle and foot 12/01/2020 No Yes S91.301A Unspecified open wound, right foot, initial encounter 12/01/2020 No Yes E11.621 Type 2 diabetes mellitus with foot ulcer 12/01/2020 No Yes Inactive Problems Resolved  Problems Electronic Signature(s) Signed: 12/01/2020 12:40:14 PM By: Kalman Shan DO Entered By: Kalman Shan on 12/01/2020 11:56:24 Fernando Vega ([id number]) -------------------------------------------------------------------------------- Progress Note Details Patient Name: Fernando Vega. Date of Service: 12/01/2020 10:15 AM Medical Record Number: [id number] Patient Account Number: [id number] Date of Birth/Sex: 08/23/1970 (50 y.o. 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 Isauro Skelley is a 50 year old male with a past medical history of uncontrolled type 2 diabetes on insulin that presents to the clinic for a right foot wound. On 8/22 patient was admitted into the hospital for necrotizing cellulitis due to a puncture wound. He had an amputation of the second and third ray. Bone culture showed acute osteomyelitis. He was placed on Ancef for 6 weeks. He has been using Betadine to the wound bed. He reports stability in wound healing. Patient's hemoglobin A1c is 11.5. Patient currently denies systemic signs of infection. Patient History Information obtained from Patient. Allergies No Known Allergies Social History Never smoker, Marital Status - Married, Alcohol Use - Never, Drug Use - No History, Caffeine Use - Never. Medical History Hematologic/Lymphatic Patient has history of Anemia Respiratory Patient has history of Sleep Apnea Endocrine Patient has history of Type II Diabetes Patient is treated with Insulin, Oral Agents. Blood sugar is tested. Blood sugar results noted at the following times: Breakfast -  284. Review of Systems (ROS) Constitutional Symptoms (General Health) Denies complaints or symptoms of Fatigue, Fever, Chills, Marked Weight Change. Eyes Complains or has symptoms of Vision Changes, Glasses / Contacts. Ear/Nose/Mouth/Throat Denies complaints or symptoms of Difficult clearing ears, Sinusitis. Respiratory states has Sarcoidosis Cardiovascular Denies complaints or symptoms of Chest pain, LE edema. Gastrointestinal Denies complaints or symptoms of Frequent diarrhea, Nausea, Vomiting, states abdominal hernia present-needs evaluated Genitourinary Denies complaints or symptoms of Kidney failure/ Dialysis, Incontinence/dribbling. Immunological Denies complaints or symptoms of Hives, Itching. Integumentary (Skin) Denies complaints or symptoms of Wounds, Bleeding or bruising tendency, Breakdown, Swelling. Musculoskeletal Denies complaints or symptoms of Muscle Pain, Muscle Weakness. Neurologic Denies complaints or symptoms of Numbness/parasthesias, Focal/Weakness. Psychiatric Denies complaints or symptoms of Anxiety, Claustrophobia. GREGORY, BARRICK ([id number]) Objective Constitutional Vitals Time Taken: 10:15 AM, Height: 67 in, Source: Stated, Weight: 183 lbs, Source: Measured, BMI: 28.7, Temperature: 97.9 F, Pulse: 87 bpm, Respiratory Rate: 16 breaths/min, Blood Pressure: 145/81 mmHg. General Notes: Right foot: Ray amputation of second and third digit. Open wound with nonviable tissue throughout. There is exposed bone that  is visible on exam. There is increased warmth and erythema to the dorsal and plantar aspect. Integumentary (Hair, Skin) Wound #1 status is Open. Original cause of wound was Puncture. The date acquired was: 10/11/2020. The wound is located on the Right,Distal Foot. The wound measures 6cm length x 4cm width x 4.2cm depth; 18.85cm^2 area and 79.168cm^3 volume. There is bone, joint, muscle, tendon, and Fat Layer (Subcutaneous Tissue) exposed. There is no  tunneling noted, however, there is undermining starting at 2:00 and ending at 3:00 with a maximum distance of 1cm. There is additional undermining and at 9:00 and ending at 9:00 with a maximum distance of 0.5cm. There is a large amount of serosanguineous drainage noted. The wound margin is thickened. There is medium (34-66%) red, pink granulation within the wound bed. There is a medium (34-66%) amount of necrotic tissue within the wound bed including Eschar and Adherent Slough. Assessment Active Problems ICD-10 Other acute osteomyelitis, right ankle and foot Unspecified open wound, right foot, initial encounter Type 2 diabetes mellitus with foot ulcer Patient presents with a nonhealing wound to the right foot status post amputation of second and third digit due to necrotizing cellulitis from a puncture wound. On exam patient has bone exposed with increased redness and warmth to the plantar and dorsal aspect. I offered to do a bone biopsy however patient declined. I recommended MRI to further assess the extent of bony destruction. I also outlined the area of erythema and stated that if the redness increased he would need to go to the emergency department. I also discussed signs and symptoms of worsening infection and if these occurred he would also need to go to the emergency department. I will give him clindamycin as I do not think the infection is being controlled currently with just Ancef. Vitals are stable. Patient states he feels fine and states that the wound is stable. At this time I recommended Santyl and silver alginate for wound dressings and keeping the area covered. We had a long discussion about the importance of glycemic control and its role in wound healing. Based on my exam findings I did tell the patient this is a high risk of amputation. He expressed understanding. He has follow-up with infectious disease in 1 week. It is unclear if he is following up with Dr. Amalia Hailey. 60 minutes  was spent on the encounter including face-to-face, EMR review and coordination of care Procedures Wound #1 Pre-procedure diagnosis of Wound #1 is an Open Surgical Wound located on the Right,Distal Foot .Severity of Tissue Pre Debridement is: Bone involvement without necrosis. There was a Chemical/Enzymatic/Mechanical debridement performed by Kalman Shan, MD. With the following instrument(s): santyl. Agent used was [employer]. A time out was conducted at 11:17, prior to the start of the procedure. There was no bleeding. The procedure was tolerated well. Post Debridement Measurements: 6cm length x 4cm width x 4.2cm depth; 79.168cm^3 volume. Character of Wound/Ulcer Post Debridement is improved. Severity of Tissue Post Debridement is: Bone involvement without necrosis. Post procedure Diagnosis Wound #1: Same as Pre-Procedure Plan Follow-up Appointments: Return Appointment in 1 week. Bathing/ Shower/ Hygiene: Clean wound with Normal Saline or wound cleanser. May shower with wound dressing protected with water repellent cover or cast protector. TEX, CONROY R. ([id number]) No tub bath. Off-Loading: Open toe surgical shoe Medications-Please add to medication list.: P.O. Antibiotics - Pick up the antibiotic at [facility] to start taking by mouth-we will send it in. Pick up the Santyl ointment at [facility] and take the coupon with  you that we gave you for the wound care Other: - continue IV antibiotics as ordered and handled by Infectious Disease and keep appt with Infectious Disease 10/06/20; continue to follow with [facility], Dr. Amalia Hailey Stop using the Betadine Radiology ordered were: MRI with and without Contrast - right foot-they will call you for MRI of right foot-we will send the order The following medication(s) was prescribed: Santyl topical 250 unit/gram ointment ointment topical for apply to yellow areas of right foot daily wound starting 12/01/2020 WOUND #1: - Foot Wound Laterality: Right,  Distal Cleanser: Byram Ancillary Kit - 15 Day Supply (DME) (Generic) 1 x Per Day/30 Days Discharge Instructions: Use supplies as instructed; Kit contains: (15) Saline Bullets; (15) 3x3 Gauze; 15 pr Gloves Cleanser: Wound Cleanser 1 x Per Day/30 Days Discharge Instructions: Wash your hands with soap and water. Remove old dressing, discard into plastic bag and place into trash. Cleanse the wound with Wound Cleanser prior to applying a clean dressing using gauze sponges, not tissues or cotton balls. Do not scrub or use excessive force. Pat dry using gauze sponges, not tissue or cotton balls. Topical: Santyl Collagenase Ointment, 30 (gm), tube 1 x Per Day/30 Days Discharge Instructions: apply nickel thick to wound bed only Primary Dressing: SILVERCEL Antimicrobial Alginate Dressing, 1x12 (in/in) 1 x Per Day/30 Days Secondary Dressing: ABD Pad 5x9 (in/in) 1 x Per Day/30 Days Discharge Instructions: Cover with ABD pad Secured With: 10M Medipore H Soft Cloth Surgical Tape, 2x2 (in/yd) 1 x Per Day/30 Days Secured With: Conforming Stretch Gauze Bandage 4x75 (in/in) 1 x Per Day/30 Days Discharge Instructions: Apply as directed 1. Clindamycin 2. Silver alginate and Santyl 3. Follow-up in 1 week Electronic Signature(s) Signed: 12/01/2020 12:40:14 PM By: Kalman Shan DO Entered By: Kalman Shan on 12/01/2020 12:11:16 Fernando Vega ([id number]) -------------------------------------------------------------------------------- ROS/PFSH Details Patient Name: Fernando Vega Date of Service: 12/01/2020 10:15 AM Medical Record Number: [id number] Patient Account Number: [id number] Date of Birth/Sex: Mar 02, 1971 (50 y.o. M) Treating RN: Donnamarie Poag Primary Care Provider: SYSTEM, PCP Other Clinician: Referring Provider: Loletha Grayer Treating Provider/Extender: Yaakov Guthrie in Treatment: 0 Information Obtained From Patient Constitutional Symptoms (General Health) Complaints and  Symptoms: Negative for: Fatigue; Fever; Chills; Marked Weight Change Eyes Complaints and Symptoms: Positive for: Vision Changes; Glasses / Contacts Ear/Nose/Mouth/Throat Complaints and Symptoms: Negative for: Difficult clearing ears; Sinusitis Cardiovascular Complaints and Symptoms: Negative for: Chest pain; LE edema Gastrointestinal Complaints and Symptoms: Negative for: Frequent diarrhea; Nausea; Vomiting Review of System Notes: states abdominal hernia present-needs evaluated Genitourinary Complaints and Symptoms: Negative for: Kidney failure/ Dialysis; Incontinence/dribbling Immunological Complaints and Symptoms: Negative for: Hives; Itching Integumentary (Skin) Complaints and Symptoms: Negative for: Wounds; Bleeding or bruising tendency; Breakdown; Swelling Musculoskeletal Complaints and Symptoms: Negative for: Muscle Pain; Muscle Weakness Neurologic Complaints and Symptoms: Negative for: Numbness/parasthesias; Focal/Weakness Psychiatric AARION, METZGAR ([id number]) Complaints and Symptoms: Negative for: Anxiety; Claustrophobia Hematologic/Lymphatic Medical History: Positive for: Anemia Respiratory Complaints and Symptoms: Review of System Notes: states has Sarcoidosis Medical History: Positive for: Sleep Apnea Endocrine Medical History: Positive for: Type II Diabetes Time with diabetes: 30 years Treated with: Insulin, Oral agents Blood sugar tested every day: Yes Tested : Blood sugar testing results: Breakfast: 284 Oncologic Immunizations Pneumococcal Vaccine: Received Pneumococcal Vaccination: No Implantable Devices None Family and Social History Never smoker; Marital Status - Married; Alcohol Use: Never; Drug Use: No History; Caffeine Use: Never; Financial Concerns: No; Food, Clothing or Shelter Needs: No; Support System Lacking: No; Transportation Concerns: No Engineer, maintenance) Signed:  12/01/2020 12:40:14 PM By: Kalman Shan DO Signed:  12/01/2020 3:15:27 PM By: Donnamarie Poag Entered By: Donnamarie Poag on 12/01/2020 10:26:48 Fernando Vega ([id number]) -------------------------------------------------------------------------------- [place] Details Patient Name: Fernando Vega Date of Service: 12/01/2020 Medical Record Number: [id number] Patient Account Number: [id number] Date of Birth/Sex: 1970-10-23 (50 y.o. M) Treating RN: Donnamarie Poag Primary Care Provider: SYSTEM, PCP Other Clinician: Referring Provider: Loletha Grayer Treating Provider/Extender: Yaakov Guthrie in Treatment: 0 Diagnosis Coding ICD-10 Codes Code Description [phone] Other acute osteomyelitis, right ankle and foot S91.301A Unspecified open wound, right foot, initial encounter E11.621 Type 2 diabetes mellitus with foot ulcer Facility Procedures CPT4 Code: [id number] Description: [place] VISIT-LEV 3 EST PT Modifier: Quantity: 1 CPT4 Code: [id number] Description: 15176 - DEBRIDE W/O ANES NON SELECT Modifier: Quantity: 1 Electronic Signature(s) Signed: 12/01/2020 12:40:14 PM By: Kalman Shan DO Signed: 12/01/2020 3:15:27 PM By: Donnamarie Poag Entered ByDonnamarie Poag on 12/01/2020 12:09:09

## 2020-12-02 ENCOUNTER — Other Ambulatory Visit: Payer: Self-pay

## 2020-12-02 ENCOUNTER — Ambulatory Visit: Payer: Self-pay | Admitting: Gerontology

## 2020-12-02 VITALS — BP 145/88 | HR 89 | Ht 66.0 in | Wt 185.4 lb

## 2020-12-02 DIAGNOSIS — E1142 Type 2 diabetes mellitus with diabetic polyneuropathy: Secondary | ICD-10-CM

## 2020-12-02 DIAGNOSIS — Z125 Encounter for screening for malignant neoplasm of prostate: Secondary | ICD-10-CM | POA: Insufficient documentation

## 2020-12-02 DIAGNOSIS — Z7689 Persons encountering health services in other specified circumstances: Secondary | ICD-10-CM | POA: Insufficient documentation

## 2020-12-02 LAB — GLUCOSE, POCT (MANUAL RESULT ENTRY): POC Glucose: 144 mg/dl — AB (ref 70–99)

## 2020-12-02 MED ORDER — INSULIN LISPRO (1 UNIT DIAL) 100 UNIT/ML (KWIKPEN)
5.0000 [IU] | PEN_INJECTOR | Freq: Three times a day (TID) | SUBCUTANEOUS | 3 refills | Status: DC
  Filled 2020-12-02: qty 15, 100d supply, fill #0

## 2020-12-02 MED ORDER — GABAPENTIN 100 MG PO CAPS
100.0000 mg | ORAL_CAPSULE | Freq: Every day | ORAL | 0 refills | Status: DC
  Filled 2020-12-02: qty 30, 30d supply, fill #0

## 2020-12-02 NOTE — Progress Notes (Signed)
New Patient Office Visit  Subjective:  Patient ID: Fernando Vega, male    DOB: Sep 10, 1970  Age: 50 y.o. MRN: [id number]  CC: No chief complaint on file.   HPI Fernando Vega  is a 50 y/o male who has history of Type 2 diabetes, Neuropathy, sarcoidosis, sleep apnea, presents to establish care and evaluation of his chronic conditions. He was discharged from the hospital on 11/16/20. 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 He continues on Cefazolin intravenous antibiotics through his right upper arm PICC line. He denies fever,and chills. He follow up with wound care and Podiatrist. His HgbA1c done on 11/01/20 was 11.8%.He checks his blood glucose tid and his blood glucose reading was 144 mg/dl. He continues on 10 units Basaglar at bedtime, 3 units of Humalog tid with meals. He denies hypo/hyperglycemic symptoms, but endorses worsening peripheral neuropathy. He denies chest pain, palpitation, dizziness and vision changes. Overall, he states that he's doing well and offers no further complaint.  Past Medical History:  Diagnosis Date   Diabetes mellitus without complication ([place])    Neuropathy    Sarcoidosis    Sleep apnea     Past Surgical History:  Procedure Laterality Date   AMPUTATION Right 11/05/2020   Procedure: AMPUTATION RAY-Partial Ray Amp 2nd & 3rd Digit;  Surgeon: Edrick Kins, DPM;  Location: [facility];  Service: Podiatry;  Laterality: Right;   APPLICATION OF WOUND VAC Right 11/09/2020   Procedure: APPLICATION OF WOUND VAC;  Surgeon: Edrick Kins, DPM;  Location: [facility];  Service: Podiatry;  Laterality: Right;   INCISION AND DRAINAGE Right 11/01/2020   Procedure: INCISION AND DRAINAGE-Right Foot;  Surgeon: Criselda Peaches, DPM;  Location: [facility];  Service: Podiatry;  Laterality: Right;   IRRIGATION AND DEBRIDEMENT FOOT Right 11/09/2020   Procedure: IRRIGATION AND DEBRIDEMENT  FOOT;  Surgeon: Edrick Kins, DPM;  Location: [facility];  Service: Podiatry;  Laterality: Right;    Family History  Problem Relation Age of Onset   Hypertension Mother     Social History   Socioeconomic History   Marital status: Married    Spouse name: Not on file   Number of children: Not on file   Years of education: Not on file   Highest education level: Not on file  Occupational History   Not on file  Tobacco Use   Smoking status: Never   Smokeless tobacco: Never  Substance and Sexual Activity   Alcohol use: No   Drug use: No   Sexual activity: Not on file  Other Topics Concern   Not on file  Social History Narrative   Not on file   Social Determinants of Health   Financial Resource Strain: Not on file  Food Insecurity: Not on file  Transportation Needs: Not on file  Physical Activity: Not on file  Stress: Not on file  Social Connections: Not on file  Intimate Partner Violence: Not on file    ROS Review of Systems  Constitutional: Negative.   HENT: Negative.    Eyes: Negative.   Respiratory: Negative.    Cardiovascular: Negative.   Endocrine: Negative.   Neurological:  Positive for numbness.  Hematological: Negative.   Psychiatric/Behavioral: Negative.     Objective:   Today's Vitals: BP (!) 145/88 (BP Location: Left Arm, Patient Position: Sitting, Cuff Size: Large)   Pulse 89   Ht  5\' 6"  (1.676 m)   Wt 185 lb 6 oz (84.1 kg)   SpO2 96%   BMI 29.92 kg/m   Physical Exam HENT:     Head: Normocephalic and atraumatic.     Nose: Nose normal.     Mouth/Throat:     Mouth: Mucous membranes are moist.  Eyes:     Extraocular Movements: Extraocular movements intact.     Conjunctiva/sclera: Conjunctivae normal.     Pupils: Pupils are equal, round, and reactive to light.  Cardiovascular:     Rate and Rhythm: Normal rate and regular rhythm.     Pulses: Normal pulses.     Heart sounds: Normal heart sounds.  Pulmonary:     Effort: Pulmonary effort is  normal.     Breath sounds: Normal breath sounds.  Abdominal:     General: Bowel sounds are normal.     Palpations: Abdomen is soft.  Genitourinary:    Comments: Deferred per patient. Musculoskeletal:        General: Normal range of motion.     Cervical back: Normal range of motion.  Skin:      Neurological:     Mental Status: He is alert and oriented to person, place, and time. Mental status is at baseline.     Sensory: Sensory deficit (decreased monofilament to feet) present.  Psychiatric:        Mood and Affect: Mood normal.        Behavior: Behavior normal.        Thought Content: Thought content normal.        Judgment: Judgment normal.    Assessment & Plan:    1. Encounter to establish care -Routine labs will be checked. - Lipid panel; Future - Urine Microalbumin w/creat. ratio; Future  2. Type 2 diabetes mellitus with peripheral neuropathy (HCC) - His HgbA1c was 11.8%, his goal should be less than 7%. Sliding scale was added and was started on gabapentin 100 mg. He was educated on medication side effects and advised to notify clinic. He's to check his blood pressure tid, low carb non concentrated sweet diet. - gabapentin (NEURONTIN) 100 MG capsule; Take 1 capsule (100 mg total) by mouth once daily at bedtime.  Dispense: 30 capsule; Refill: 0 - insulin lispro (HUMALOG KWIKPEN) 100 UNIT/ML KwikPen; Inject 5 Units into the skin 3 (three) times daily before meals. Sliding scale: 201-250 add 1 unit; 251-300 add 2 units; 301-350 add 3 units; 351-400 add 4 units; If over 400 call clinic if closed go to the Emergency room.  Dispense: 15 mL; Refill: 3 - POCT Glucose (CBG); Future - POCT Glucose (CBG)     Follow-up: Return in about 4 weeks (around 12/30/2020), or if symptoms worsen or fail to improve.   Fernando Hata Jerold Coombe, NP

## 2020-12-02 NOTE — Patient Instructions (Signed)
Carbohydrate Counting for Diabetes Mellitus, Adult Carbohydrate counting is a method of keeping track of how many carbohydrates you eat. Eating carbohydrates naturally increases the amount of sugar (glucose) in the blood. Counting how many carbohydrates you eat improves your blood glucose control, which helps you manage your diabetes. It is important to know how many carbohydrates you can safely have in each meal. This is different for every person. A dietitian can help you make a meal plan and calculate how many carbohydrates you should have at each meal and snack. What foods contain carbohydrates? Carbohydrates are found in the following foods: Grains, such as breads and cereals. Dried beans and soy products. Starchy vegetables, such as potatoes, peas, and corn. Fruit and fruit juices. Milk and yogurt. Sweets and snack foods, such as cake, cookies, candy, chips, and soft drinks. How do I count carbohydrates in foods? There are two ways to count carbohydrates in food. You can read food labels or learn standard serving sizes of foods. You can use either of the methods or a combination of both. Using the Nutrition Facts label The Nutrition Facts list is included on the labels of almost all packaged foods and beverages in the [place] It includes: The serving size. Information about nutrients in each serving, including the grams (g) of carbohydrate per serving. To use the Nutrition Facts: Decide how many servings you will have. Multiply the number of servings by the number of carbohydrates per serving. The resulting number is the total amount of carbohydrates that you will be having. Learning the standard serving sizes of foods When you eat carbohydrate foods that are not packaged or do not include Nutrition Facts on the label, you need to measure the servings in order to count the amount of carbohydrates. Measure the foods that you will eat with a food scale or measuring cup, if needed. Decide how  many standard-size servings you will eat. Multiply the number of servings by 15. For foods that contain carbohydrates, one serving equals 15 g of carbohydrates. For example, if you eat 2 cups or 10 oz (300 g) of strawberries, you will have eaten 2 servings and 30 g of carbohydrates (2 servings x 15 g = 30 g). For foods that have more than one food mixed, such as soups and casseroles, you must count the carbohydrates in each food that is included. The following list contains standard serving sizes of common carbohydrate-rich foods. Each of these servings has about 15 g of carbohydrates: 1 slice of bread. 1 six-inch (15 cm) tortilla. ? cup or 2 oz (53 g) cooked rice or pasta.  cup or 3 oz (85 g) cooked or canned, drained and rinsed beans or lentils.  cup or 3 oz (85 g) starchy vegetable, such as peas, corn, or squash.  cup or 4 oz (120 g) hot cereal.  cup or 3 oz (85 g) boiled or mashed potatoes, or  or 3 oz (85 g) of a large baked potato.  cup or 4 fl oz (118 mL) fruit juice. 1 cup or 8 fl oz (237 mL) milk. 1 small or 4 oz (106 g) apple.  or 2 oz (63 g) of a medium banana. 1 cup or 5 oz (150 g) strawberries. 3 cups or 1 oz (24 g) popped popcorn. What is an example of carbohydrate counting? To calculate the number of carbohydrates in this sample meal, follow the steps shown below. Sample meal 3 oz (85 g) chicken breast. ? cup or 4 oz (106 g) brown   rice.  cup or 3 oz (85 g) corn. 1 cup or 8 fl oz (237 mL) milk. 1 cup or 5 oz (150 g) strawberries with sugar-free whipped topping. Carbohydrate calculation Identify the foods that contain carbohydrates: Rice. Corn. Milk. Strawberries. Calculate how many servings you have of each food: 2 servings rice. 1 serving corn. 1 serving milk. 1 serving strawberries. Multiply each number of servings by 15 g: 2 servings rice x 15 g = 30 g. 1 serving corn x 15 g = 15 g. 1 serving milk x 15 g = 15 g. 1 serving strawberries x 15 g = 15  g. Add together all of the amounts to find the total grams of carbohydrates eaten: 30 g + 15 g + 15 g + 15 g = 75 g of carbohydrates total. What are tips for following this plan? Shopping Develop a meal plan and then make a shopping list. Buy fresh and frozen vegetables, fresh and frozen fruit, dairy, eggs, beans, lentils, and whole grains. Look at food labels. Choose foods that have more fiber and less sugar. Avoid processed foods and foods with added sugars. Meal planning Aim to have the same amount of carbohydrates at each meal and for each snack time. Plan to have regular, balanced meals and snacks. Where to find more information American Diabetes Association: www.diabetes.org Centers for Disease Control and Prevention: www.cdc.gov Summary Carbohydrate counting is a method of keeping track of how many carbohydrates you eat. Eating carbohydrates naturally increases the amount of sugar (glucose) in the blood. Counting how many carbohydrates you eat improves your blood glucose control, which helps you manage your diabetes. A dietitian can help you make a meal plan and calculate how many carbohydrates you should have at each meal and snack. This information is not intended to replace advice given to you by your health care provider. Make sure you discuss any questions you have with your health care provider. Document Revised: 02/27/2019 Document Reviewed: 02/28/2019 Elsevier Patient Education  2021 Elsevier Inc.  

## 2020-12-03 ENCOUNTER — Other Ambulatory Visit: Payer: Self-pay

## 2020-12-03 NOTE — Progress Notes (Signed)
   Subjective:  Patient presents today status post second and third ray amputations performed inpatient in the hospital with application of wound VAC right foot. DOS: 11/05/2020.  Unfortunately the patient does not have any insurance and he was unable to be discharged at home with a wound VAC.  He has been applying Betadine wet-to-dry dressings since discharge.  He states that he has been having made a major drainage to the area.  He has no pain associated to the area due to his neuropathy.  He presents for further treatment and evaluation  Past Medical History:  Diagnosis Date   Diabetes mellitus without complication ([place])    Neuropathy    Sarcoidosis    Sleep apnea          Objective/Physical Exam Prior amputation second and third rays right foot.  There is exposed deep muscle and fascial tissue with exposed bone.  No malodor.  Moderate serous drainage noted.  There is an extensive amount of slough fibrin and necrotic tissue noted within the amputation site.  Please see above picture   Assessment: 1. s/p second and third ray amputations right foot. DOS: 11/05/2020   Plan of Care:  1. Patient was evaluated.  2.  Unfortunately the patient would benefit greatly from negative pressure wound VAC to this area.  That was the initial plan with the amputation.  Unfortunately he does not have insurance and unable to pursue this modality of treatment. 3.  For now we are going to continue Betadine wet-to-dry dressings and hope for the best unfortunately.  Dressing supplies were provided for the patient to apply daily.  The patient understands he is high risk for more proximal amputation. 4.  I counseled and advised the patient to find some sort of charity insurance whether from [facility] or [facility] to see if that would allow [place] to better manage his situation and provide some avenues for better treatment.  Patient understands and will pursue those options 5.  Return to clinic in 4 weeks   Edrick Kins,  DPM [facility]  Dr. Edrick Kins, DPM    [address], [place] [address]                Office [phone]  Fax [phone]

## 2020-12-07 ENCOUNTER — Other Ambulatory Visit: Payer: Self-pay

## 2020-12-07 ENCOUNTER — Ambulatory Visit
Admission: RE | Admit: 2020-12-07 | Discharge: 2020-12-07 | Disposition: A | Discharge status: Home / Self Care | Payer: Medicaid Other | Source: Ambulatory Visit | Attending: Internal Medicine | Admitting: Internal Medicine

## 2020-12-07 ENCOUNTER — Ambulatory Visit
Admission: RE | Admit: 2020-12-07 | Discharge: 2020-12-07 | Disposition: A | Discharge status: Home / Self Care | Payer: Medicaid Other | Source: Ambulatory Visit | Attending: Infectious Diseases | Admitting: Infectious Diseases

## 2020-12-07 ENCOUNTER — Ambulatory Visit: Payer: Medicaid Other | Attending: Infectious Diseases | Admitting: Infectious Diseases

## 2020-12-07 VITALS — BP 114/70 | HR 90 | Temp 97.6°F | Resp 16 | Ht 66.0 in | Wt 185.3 lb

## 2020-12-07 DIAGNOSIS — D649 Anemia, unspecified: Secondary | ICD-10-CM | POA: Diagnosis not present

## 2020-12-07 DIAGNOSIS — E1142 Type 2 diabetes mellitus with diabetic polyneuropathy: Secondary | ICD-10-CM

## 2020-12-07 DIAGNOSIS — L089 Local infection of the skin and subcutaneous tissue, unspecified: Secondary | ICD-10-CM | POA: Diagnosis not present

## 2020-12-07 DIAGNOSIS — Z794 Long term (current) use of insulin: Secondary | ICD-10-CM | POA: Insufficient documentation

## 2020-12-07 DIAGNOSIS — E11628 Type 2 diabetes mellitus with other skin complications: Secondary | ICD-10-CM | POA: Insufficient documentation

## 2020-12-07 DIAGNOSIS — B999 Unspecified infectious disease: Secondary | ICD-10-CM | POA: Insufficient documentation

## 2020-12-07 DIAGNOSIS — L97519 Non-pressure chronic ulcer of other part of right foot with unspecified severity: Secondary | ICD-10-CM | POA: Diagnosis not present

## 2020-12-07 DIAGNOSIS — E1165 Type 2 diabetes mellitus with hyperglycemia: Secondary | ICD-10-CM | POA: Diagnosis not present

## 2020-12-07 DIAGNOSIS — S91301A Unspecified open wound, right foot, initial encounter: Secondary | ICD-10-CM | POA: Insufficient documentation

## 2020-12-07 DIAGNOSIS — Z79899 Other long term (current) drug therapy: Secondary | ICD-10-CM | POA: Diagnosis not present

## 2020-12-07 DIAGNOSIS — E11621 Type 2 diabetes mellitus with foot ulcer: Secondary | ICD-10-CM | POA: Insufficient documentation

## 2020-12-07 DIAGNOSIS — G629 Polyneuropathy, unspecified: Secondary | ICD-10-CM | POA: Insufficient documentation

## 2020-12-07 LAB — CBC WITH DIFFERENTIAL/PLATELET
Abs Immature Granulocytes: 0.02 10*3/uL (ref 0.00–0.07)
Basophils Absolute: 0 10*3/uL (ref 0.0–0.1)
Basophils Relative: 0 %
Eosinophils Absolute: 0.1 10*3/uL (ref 0.0–0.5)
Eosinophils Relative: 2 %
HCT: 30.5 % — ABNORMAL LOW (ref 39.0–52.0)
Hemoglobin: 9.9 g/dL — ABNORMAL LOW (ref 13.0–17.0)
Immature Granulocytes: 0 %
Lymphocytes Relative: 36 %
Lymphs Abs: 2.2 10*3/uL (ref 0.7–4.0)
MCH: 29 pg (ref 26.0–34.0)
MCHC: 32.5 g/dL (ref 30.0–36.0)
MCV: 89.4 fL (ref 80.0–100.0)
Monocytes Absolute: 1 10*3/uL (ref 0.1–1.0)
Monocytes Relative: 17 %
Neutro Abs: 2.8 10*3/uL (ref 1.7–7.7)
Neutrophils Relative %: 45 %
Platelets: 274 10*3/uL (ref 150–400)
RBC: 3.41 MIL/uL — ABNORMAL LOW (ref 4.22–5.81)
RDW: 13.4 % (ref 11.5–15.5)
WBC: 6.1 10*3/uL (ref 4.0–10.5)
nRBC: 0 % (ref 0.0–0.2)

## 2020-12-07 LAB — COMPREHENSIVE METABOLIC PANEL
ALT: 7 U/L (ref 0–44)
AST: 17 U/L (ref 15–41)
Albumin: 3 g/dL — ABNORMAL LOW (ref 3.5–5.0)
Alkaline Phosphatase: 75 U/L (ref 38–126)
Anion gap: 9 (ref 5–15)
BUN: 16 mg/dL (ref 6–20)
CO2: 27 mmol/L (ref 22–32)
Calcium: 8.4 mg/dL — ABNORMAL LOW (ref 8.9–10.3)
Chloride: 100 mmol/L (ref 98–111)
Creatinine, Ser: 0.67 mg/dL (ref 0.61–1.24)
GFR, Estimated: >60 mL/min (ref >60)
Glucose, Bld: 177 mg/dL — ABNORMAL HIGH (ref 70–99)
Potassium: 3.8 mmol/L (ref 3.5–5.1)
Sodium: 136 mmol/L (ref 135–145)
Total Bilirubin: 0.6 mg/dL (ref 0.3–1.2)
Total Protein: 7 g/dL (ref 6.5–8.1)

## 2020-12-07 LAB — SEDIMENTATION RATE: Sed Rate: 92 mm/hr — ABNORMAL HIGH (ref 0–20)

## 2020-12-07 LAB — C-REACTIVE PROTEIN: CRP: 10.7 mg/dL — ABNORMAL HIGH (ref <1.0)

## 2020-12-07 MED ORDER — HEPARIN SOD (PORK) LOCK FLUSH 100 UNIT/ML IV SOLN
INTRAVENOUS | Status: AC
  Administered 2020-12-07: 250 [IU]
  Filled 2020-12-07: qty 5

## 2020-12-07 MED ORDER — GADOBUTROL 1 MMOL/ML IV SOLN
7.5000 mL | Freq: Once | INTRAVENOUS | Status: AC | PRN
  Administered 2020-12-07: 7.5 mL via INTRAVENOUS

## 2020-12-07 MED ORDER — HEPARIN SOD (PORK) LOCK FLUSH 100 UNIT/ML IV SOLN: 250.0000 [IU] | INTRAVENOUS | Status: AC | PRN

## 2020-12-07 MED ORDER — SODIUM CHLORIDE FLUSH 0.9 % IV SOLN
INTRAVENOUS | Status: AC
  Filled 2020-12-07: qty 20

## 2020-12-07 NOTE — Progress Notes (Signed)
Patient will change dressing at home on 12/08/2020

## 2020-12-07 NOTE — Progress Notes (Signed)
NAME: Fernando Vega  DOB: [birth year]  MRN: [id number]  Date/Time: 12/07/2020 12:04 PM    ? Fernando Vega is a 50 y.o. male with a history of DM, Diabetic foot infection hospitalized between 8/22-11/16/20 is here for follow up He had Necrotizing soft tissue infection of the right foot involving the right second toe.  Dr. Daylene Katayama took him to the operating room on 11/05/2020 for removal of partial second and third ray amputation of the right foot.  Wound culture growing staph aureus.  On 11/09/2020 patient brought back to the OR by Dr. Amalia Hailey for irrigation debridement and apply wound VAC.  as no home health service could be arranged due to lack of insurance th wound vac was removed and wet to dry dressing ordered.  The patient had a PICC line placed and sent on Ancef 2 g every 8 hours until 12/12/2020 (total of 6 weeks).  Pt is here for follow. He says he is doing well- HE saw Dr.Evans on 11/26/20 and he did not think the wound was doing well. He went to Wound clinic and has been started on clindamycin on 12/01/20 He has no fever His wife does the dressing He comes to day surgery for labs and PICC dressing change   Past Medical History:  Diagnosis Date   Diabetes mellitus without complication ([place])    Neuropathy    Sarcoidosis    Sleep apnea     Past Surgical History:  Procedure Laterality Date   AMPUTATION Right 11/05/2020   Procedure: AMPUTATION RAY-Partial Ray Amp 2nd & 3rd Digit;  Surgeon: Edrick Kins, DPM;  Location: [facility] OR[facility];  Service: Podiatry;  Laterality: Right;   APPLICATION OF WOUND VAC Right 11/09/2020   Procedure: APPLICATION OF WOUND VAC;  Surgeon: Edrick Kins, DPM;  Location: [facility] OR[facility];  Service: Podiatry;  Laterality: Right;   INCISION AND DRAINAGE Right 11/01/2020   Procedure: INCISION AND DRAINAGE-Right Foot;  Surgeon: Criselda Peaches, DPM;  Location: [facility] OR[facility];  Service: Podiatry;  Laterality: Right;   IRRIGATION AND DEBRIDEMENT FOOT Right 11/09/2020   Procedure: IRRIGATION  AND DEBRIDEMENT FOOT;  Surgeon: Edrick Kins, DPM;  Location: [facility] OR[facility];  Service: Podiatry;  Laterality: Right;    Social History   Socioeconomic History   Marital status: Married    Spouse name: Not on file   Number of children: Not on file   Years of education: Not on file   Highest education level: Not on file  Occupational History   Not on file  Tobacco Use   Smoking status: Never   Smokeless tobacco: Never  Substance and Sexual Activity   Alcohol use: No   Drug use: No   Sexual activity: Not on file  Other Topics Concern   Not on file  Social History Narrative   Not on file   Social Determinants of Health   Financial Resource Strain: Not on file  Food Insecurity: Not on file  Transportation Needs: Not on file  Physical Activity: Not on file  Stress: Not on file  Social Connections: Not on file  Intimate Partner Violence: Not on file    Family History  Problem Relation Age of Onset   Hypertension Mother    No Known Allergies I? Current Outpatient Medications  Medication Sig Dispense Refill   albuterol (VENTOLIN HFA) 108 (90 Base) MCG/ACT inhaler Inhale 2 puffs into the lungs once every 6 (six) hours as needed for wheezing or shortness of breath. 6.7 g 0  ascorbic acid (VITAMIN C) 250 MG tablet Take 1 tablet (250 mg total) by mouth once daily. 30 tablet 0   ceFAZolin (ANCEF) IVPB Inject 2 g into the vein every 8 (eight) hours. Indication:  MSSA and GBS diabetic foot infection and osteomyelitis First Dose: Yes Last Day of Therapy:  12/12/2020 Labs - Once weekly:  CBC/D, CMP, ESR and CRP Please pull PIC at completion of IV antibiotics Fax weekly labs to [phone] Method of administration: IV Push Method of administration may be changed at the discretion of home infusion pharmacist based upon assessment of the patient and/or caregiver's ability to self-administer the medication ordered. 96 Units 0   collagenase (SANTYL) ointment Apply 1 application topically  daily. Place on right foot wound     gabapentin (NEURONTIN) 100 MG capsule Take 1 capsule (100 mg total) by mouth once daily at bedtime. 30 capsule 0   Insulin Glargine (BASAGLAR KWIKPEN) 100 UNIT/ML Inject 10 Units into the skin daily. 15 mL 1   insulin lispro (HUMALOG KWIKPEN) 100 UNIT/ML KwikPen Inject 5 Units into the skin 3 (three) times daily before meals. Sliding scale: 201-250 add 1 unit; 251-300 add 2 units; 301-350 add 3 units; 351-400 add 4 units; If over 400 call clinic if closed go to the Emergency room. 15 mL 3   Insulin Pen Needle 32G X 6 MM MISC USE AS DIRECTED WITH INSULIN PENS. 100 each 1   Multiple Vitamin (MULTIVITAMIN WITH MINERALS) TABS tablet Take 1 tablet by mouth once daily. 30 tablet 0   sodium chloride flush (NS) 0.9 % SOLN 20 mLs by Intracatheter route as needed (flush picc line after antibiotics). 1000 mL 1   No current facility-administered medications for this visit.     Abtx:  Anti-infectives (From admission, onward)    None       REVIEW OF SYSTEMS:  Const: negative fever, negative chills, negative weight loss Eyes: negative diplopia or visual changes, negative eye pain ENT: negative coryza, negative sore throat Resp: negative cough, hemoptysis, dyspnea Cards: negative for chest pain, palpitations, lower extremity edema GU: negative for frequency, dysuria and hematuria GI: Negative for abdominal pain, diarrhea, bleeding, constipation Skin: negative for rash and pruritus Heme: negative for easy bruising and gum/nose bleeding MS: negative for myalgias, arthralgias, back pain and muscle weakness Neurolo:negative for headaches, dizziness, vertigo, memory problems  Psych: negative for feelings of anxiety, depression  Endocrine:, diabetes Allergy/Immunology- negative for any medication or food allergies ?  Objective:  VITALS:  BP 114/70   Pulse 90   Temp 97.6 F (36.4 C) (Oral)   Resp 16   Ht '5\' 6"'  (1.676 m)   Wt 185 lb 4.8 oz (84.1 kg)   SpO2  96%   BMI 29.91 kg/m  PHYSICAL EXAM:  General: Alert, cooperative, no distress, appears stated age. pale Head: Normocephalic, without obvious abnormality, atraumatic. Eyes: Conjunctivae clear, anicteric sclerae. Pupils are equal ENT Nares normal. No drainage or sinus tenderness. Lips, mucosa, and tongue normal. No Thrush Neck: Supple, symmetrical, no adenopathy, thyroid: non tender no carotid bruit and no JVD. Back: No CVA tenderness. Lungs: Clear to auscultation bilaterally. No Wheezing or Rhonchi. No rales. Heart: Regular rate and rhythm, no murmur, rub or gallop. Abdomen: Soft, non-tender,not distended. Bowel sounds normal. No masses Extremities: rt foot- surgical site does not look healthy- there is extensive slough- surrounding erythema Washed the wound well with saline and took cultures     Rt PICC  Skin: No rashes or lesions. Or bruising  Lymph: Cervical, supraclavicular normal. Neurologic: Grossly non-focal Pertinent Labs Lab Results CBC    Component Value Date/Time   WBC 6.4 11/30/2020 1408   RBC 3.50 (L) 11/30/2020 1408   HGB 10.3 (L) 11/30/2020 1408   HGB 13.6 12/03/2012 0520   HCT 31.9 (L) 11/30/2020 1408   HCT 39.8 (L) 12/03/2012 0520   PLT 371 11/30/2020 1408   PLT 171 12/03/2012 0520   MCV 91.1 11/30/2020 1408   MCV 91 12/03/2012 0520   MCH 29.4 11/30/2020 1408   MCHC 32.3 11/30/2020 1408   RDW 13.0 11/30/2020 1408   RDW 12.4 12/03/2012 0520   LYMPHSABS 2.1 11/23/2020 1438   LYMPHSABS 2.7 12/03/2012 0520   MONOABS 1.0 11/23/2020 1438   MONOABS 1.5 (H) 12/03/2012 0520   EOSABS 0.2 11/23/2020 1438   EOSABS 0.3 12/03/2012 0520   BASOSABS 0.0 11/23/2020 1438   BASOSABS 0.0 12/03/2012 0520    CMP Latest Ref Rng & Units 11/30/2020 11/23/2020 11/16/2020  Glucose 70 - 99 mg/dL 229(H) 172(H) -  BUN 6 - 20 mg/dL 17 17 -  Creatinine 0.61 - 1.24 mg/dL 0.64 0.84 0.55(L)  Sodium 135 - 145 mmol/L 140 138 -  Potassium 3.5 - 5.1 mmol/L 3.7 3.9 -  Chloride 98 - 111  mmol/L 100 100 -  CO2 22 - 32 mmol/L 30 31 -  Calcium 8.9 - 10.3 mg/dL 8.6(L) 8.7(L) -  Total Protein 6.5 - 8.1 g/dL 7.0 7.2 -  Total Bilirubin 0.3 - 1.2 mg/dL 0.6 0.6 -  Alkaline Phos 38 - 126 U/L 78 89 -  AST 15 - 41 U/L 14(L) 15 -  ALT 0 - 44 U/L 7 8 -      ? Impression/Recommendation ? ?Diabetic foot infection with necrotizing wound- underwent amputaion 2/3/toes rt foot.  MSSA and GBS in the wound culture from surgery on 11/01/20- has been on cefazolin IV which he will complete on 10/2 The wound looks infected with slough. Culture taken after washing with saline- will expand coverage depending on that. Continue cefazolin and complete clindamycin  Unfortunately patient does not have insurance and could not get wound vac which he needed. Followed by podiatrist and wound clinic  DM- not well controlled Neuropathy  Anemia ? ___Follow culture result and labs ________________________________________________ Discussed with patient, Will let him know once I have the culture result Note:  This document was prepared using Dragon voice recognition software and may include unintentional dictation errors.

## 2020-12-07 NOTE — Patient Instructions (Addendum)
Today you are here to follow up on the rt foot infection- you had 2nd/3rd toe amputaion and the wound site has slough- today I cleaned it and took culture- you are on cefazolin and the wound clinic gave you clindamycin- will await these cultures to adjust the antibiotic. Will remove PICC line on 12/13/20 once you complete the IV antibiotic. Follow up in 4 weeks

## 2020-12-08 ENCOUNTER — Encounter (HOSPITAL_BASED_OUTPATIENT_CLINIC_OR_DEPARTMENT_OTHER): Payer: Medicaid Other | Admitting: Internal Medicine

## 2020-12-08 DIAGNOSIS — M86171 Other acute osteomyelitis, right ankle and foot: Secondary | ICD-10-CM

## 2020-12-08 DIAGNOSIS — S91301D Unspecified open wound, right foot, subsequent encounter: Secondary | ICD-10-CM | POA: Diagnosis not present

## 2020-12-08 DIAGNOSIS — E11621 Type 2 diabetes mellitus with foot ulcer: Secondary | ICD-10-CM

## 2020-12-08 NOTE — Progress Notes (Signed)
CARLESS, SLATTEN ([id number]) Visit Report for 12/08/2020 Chief Complaint Document Details Patient Name: Fernando Vega, Fernando Vega. Date of Service: 12/08/2020 1:00 PM Medical Record Number: [id number] Patient Account Number: [id number] Date of Birth/Sex: Jan 15, 1971 (50 y.o. 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 Date of Service: 12/08/2020 1:00 PM Medical Record Number: [id number] Patient Account Number: [id number] Date of Birth/Sex: November 12, 1970 (50 y.o. M) Treating RN: Donnamarie Poag Primary Care Provider: Caryl Asp Other Clinician: Referring Provider: Loletha Grayer Treating Provider/Extender: Yaakov Guthrie in Treatment: 1 Debridement Performed for Wound #1 Right,Distal Foot Assessment: Performed By: Physician Kalman Shan, MD Debridement Type: Chemical/Enzymatic/Mechanical Agent Used: Santyl Severity of Tissue Pre Debridement: Bone involvement without necrosis Level of Consciousness (Pre- Awake and Alert procedure): Pre-procedure Verification/Time Out Yes - 13:30 Taken: Start Time: 13:30 Bleeding: None Response to Treatment: Procedure was tolerated well Level of Consciousness (Post- Awake and Alert procedure): Post Debridement Measurements of Total Wound Length: (cm) 6 Width: (cm) 4 Depth: (cm) 4.5 Volume: (cm) 84.823 Character of Wound/Ulcer Post Debridement:  Requires Further Debridement Severity of Tissue Post Debridement: Bone involvement without necrosis Post Procedure Diagnosis Same as Pre-procedure Electronic Signature(s) Signed: 12/08/2020 1:44:25 PM By: Donnamarie Poag Signed: 12/08/2020 1:55:03 PM By: Kalman Shan DO Entered By: Donnamarie Poag on 12/08/2020 13:44:25 Fernando Vega ([id number]) -------------------------------------------------------------------------------- HPI Details Patient Name: Fernando Vega. Date of Service: 12/08/2020 1:00 PM Medical Record Number: [id number] Patient Account Number: [id number] Date of Birth/Sex: 1971-02-20 (50 y.o. M) Treating RN: Donnamarie Poag Primary Care Provider: Caryl Asp Other Clinician: Referring Provider: Loletha Grayer Treating Provider/Extender: Yaakov Guthrie in Treatment: 1 History of Present Illness HPI Description: Admission 12/01/2020 Fernando Vega is a 50 year old male with a past medical history of uncontrolled type 2 diabetes on insulin that presents to the clinic for a right foot wound. On 8/22 patient was admitted into the hospital for necrotizing cellulitis due to a puncture wound. He had an amputation of the second and third ray. Bone culture showed acute osteomyelitis. He was placed on Ancef for 6 weeks. He has been using Betadine to the wound bed. He reports stability in wound healing. Patient's hemoglobin A1c is 11.5. Patient currently denies systemic signs of infection. 9/28; patient presents for 1 week follow-up. He obtained Santyl and has been using Santyl and silver alginate to the wound bed. He recently saw infectious disease, Dr. Steva Ready. He states she took a culture at that time. He states he is on his last dose of clindamycin prescribed at last clinic visit by [place]. He currently denies systemic signs of infection. Electronic Signature(s) Signed: 12/08/2020 1:36:16 PM By: Kalman Shan DO Entered By: Kalman Shan on 12/08/2020 13:30:29 Fernando Vega ([id number]) -------------------------------------------------------------------------------- Physical Exam Details Patient Name: Fernando Vega Date of Service: 12/08/2020 1:00 PM Medical Record Number: [id number] Patient Account Number: [id number] Date of Birth/Sex: 09/12/1970 (50 y.o. M) Treating RN: Donnamarie Poag Primary Care Provider: Caryl Asp Other Clinician: Referring Provider: Loletha Grayer Treating Provider/Extender: Yaakov Guthrie in Treatment: 1 Constitutional . Psychiatric . Notes Right foot: Ray amputation of second and third digit. Open wound with nonviable  tissue throughout. Probes to bone. Erythema noted. No increased warmth. Electronic Signature(s) Signed: 12/08/2020 1:36:16 PM By: Kalman Shan DO Entered By: Kalman Shan on 12/08/2020 13:31:44 Fernando Vega ([id number]) -------------------------------------------------------------------------------- Physician Orders Details Patient Name: Fernando Vega. Date of Service: 12/08/2020 1:00 PM Medical Record Number: [id number] Patient Account Number: [id number] Date of Birth/Sex: 01-23-1971 (50 y.o. M) Treating RN: Donnamarie Poag Primary Care Provider: Caryl Asp Other Clinician: Referring Provider: Loletha Grayer Treating Provider/Extender: Yaakov Guthrie in Treatment: 1 Verbal / Phone Orders: No Diagnosis Coding Follow-up Appointments o Return Appointment in 1 week. o Other: - If you see increased redness or fever onset or any other sign of infection, go directly to the Emergency Department Bathing/ Shower/ Hygiene o Clean wound with Normal Saline or wound cleanser. o May shower with wound dressing protected with water repellent cover or cast protector. o No tub bath. Off-Loading o Open toe surgical shoe Medications-Please add to medication list. o Take one 500mg  Tylenol (Acetaminophen) and one 200mg  Motrin (Ibuprofen) every 6 hours for pain. Do not  take ibuprofen if you are on blood thinners or have stomach ulcers. o P.O. Antibiotics - Continue antibiotics as prescribed o Other: - continue IV antibiotics as ordered and handled by Infectious Disease and keep appt with Infectious Disease; continue to follow with [facility], Dr. Amalia Hailey Wound Treatment Wound #1 - Foot Wound Laterality: Right, Distal Cleanser: Normal Saline 1 x Per Day/30 Days Discharge Instructions: Wash your hands with soap and water. Remove old dressing, discard into plastic bag and place into trash. Cleanse the wound with Normal Saline prior to applying a clean dressing using gauze sponges, not tissues or cotton balls. Do not scrub or use excessive force. Pat dry using gauze sponges, not tissue or cotton balls. Topical: Santyl Collagenase Ointment, 30 (gm), tube 1 x Per Day/30 Days Discharge Instructions: apply nickel thick to wound bed only Primary Dressing: SILVERCEL Antimicrobial Alginate Dressing, 1x12 (in/in) 1 x Per Day/30 Days Secondary Dressing: ABD Pad 5x9 (in/in) 1 x Per Day/30 Days Discharge Instructions: Cover with ABD pad Secured With: 56M Medipore H Soft Cloth Surgical Tape, 2x2 (in/yd) 1 x Per Day/30 Days Secured With: Kerlix Roll Sterile or Non-Sterile 6-ply 4.5x4 (yd/yd) 1 x Per Day/30 Days Discharge Instructions: Apply Kerlix as directed Notes Culture done at ID appt--meds will be adjusted by results of infection Electronic Signature(s) Signed: 12/08/2020 1:36:16 PM By: Kalman Shan DO Signed: 12/08/2020 3:51:14 PM By: Donnamarie Poag Entered By: Donnamarie Poag on 12/08/2020 13:27:08 Fernando Vega ([id number]) -------------------------------------------------------------------------------- Problem List Details Patient Name: Fernando Vega. Date of Service: 12/08/2020 1:00 PM Medical Record Number: [id number] Patient Account Number: [id number] Date of Birth/Sex: 08-16-1970 (50 y.o. M) Treating RN: Donnamarie Poag Primary Care Provider: Caryl Asp  Other Clinician: Referring Provider: Loletha Grayer Treating Provider/Extender: Yaakov Guthrie in Treatment: 1 Active Problems ICD-10 Encounter Code Description Active Date MDM Diagnosis S91.301D Unspecified open wound, right foot, subsequent encounter 12/08/2020 No Yes M86.171 Other acute osteomyelitis, right ankle and foot 12/01/2020 No Yes E11.621 Type 2 diabetes mellitus with foot ulcer 12/01/2020 No Yes Inactive Problems ICD-10 Code Description Active Date Inactive Date S91.301A Unspecified open wound, right foot, initial encounter 12/01/2020 12/01/2020 Resolved Problems Electronic Signature(s) Signed: 12/08/2020 1:36:16 PM By: Kalman Shan DO Entered By: Kalman Shan on 12/08/2020 13:28:25 Fernando Vega ([id number]) -------------------------------------------------------------------------------- Progress Note Details Patient Name: Fernando Vega. Date of Service: 12/08/2020 1:00 PM Medical Record Number: [id number] Patient Account Number: [id number] Date of Birth/Sex: 1970-08-31 (50  y.o. 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 On 8/22 patient was admitted into the hospital for necrotizing cellulitis due to a puncture wound. He had an amputation of the second and third ray. Bone culture showed acute osteomyelitis. He was placed on Ancef for 6 weeks. He has been using Betadine to the wound bed. He reports stability in wound healing.  Patient's hemoglobin A1c is 11.5. Patient currently denies systemic signs of infection. 9/28; patient presents for 1 week follow-up. He obtained Santyl and has been using Santyl and silver alginate to the wound bed. He recently saw infectious disease, Dr. Steva Ready. He states she took a culture at that time. He states he is on his last dose of clindamycin prescribed at last clinic visit by [place]. He currently denies systemic signs of infection. Patient History Information obtained from Patient. Social History Never smoker, Marital Status - Married, Alcohol Use - Never, Drug Use - No History, Caffeine Use - Never. Medical History Hematologic/Lymphatic Patient has history of Anemia Respiratory Patient has history of Sleep Apnea Endocrine Patient has history of Type II Diabetes Objective Constitutional Vitals Time Taken: 12:59 PM, Height: 67 in, Weight: 183 lbs, BMI: 28.7, Temperature: 98.5 F, Pulse: 108 bpm, Respiratory Rate: 16 breaths/min, Blood Pressure: 157/84 mmHg. General Notes: Right foot: Ray amputation of second and third digit. Open wound with nonviable tissue throughout. Probes to bone. Erythema noted. No increased warmth. Integumentary (Hair, Skin) Wound #1 status is Open. Original cause of wound was Puncture. The date acquired was: 10/11/2020. The wound has been in treatment 1 weeks. The wound is located on the Right,Distal Foot. The wound measures 6cm length x 4cm width x 4.5cm depth; 18.85cm^2 area and 84.823cm^3 volume. There is bone, joint, muscle, tendon, and Fat Layer (Subcutaneous Tissue) exposed. Tunneling has been noted at 12:00 with a maximum distance of 0.8cm. Undermining begins at 3:00 and ends at 4:00 with a maximum distance of 1cm. There is additional undermining and at 9:00 and ends at 9:00 with a maximum distance of 0.5cm. There is a large amount of serosanguineous drainage noted. The wound margin is thickened. There is small (1-33%) red, pink granulation within  the wound bed. There is a large (67-100%) amount of necrotic tissue within the wound bed including Eschar, Adherent Slough and Necrosis of Muscle. Fernando Vega, Fernando Vega ([id number]) Assessment Active Problems ICD-10 Unspecified open wound, right foot, subsequent encounter Other acute osteomyelitis, right ankle and foot Type 2 diabetes mellitus with foot ulcer Patient's wound is stable. I recommended continuing Santyl and silver alginate. He saw Dr. Steva Ready yesterday and she did a wound culture. Wound results are still pending. Patient is still high risk for amputation. I advised to go to the ED if he has increased warmth, redness drainage, fever/chills or pain. Plan Follow-up Appointments: Return Appointment in 1 week. Other: - If you see increased redness or fever onset or any other sign of infection, go directly to the Emergency Department Bathing/ Shower/ Hygiene: Clean wound with Normal Saline or wound cleanser. May shower with wound dressing protected with water repellent  cover or cast protector. No tub bath. Off-Loading: Open toe surgical shoe Medications-Please add to medication list.: Take one 500mg  Tylenol (Acetaminophen) and one 200mg  Motrin (Ibuprofen) every 6 hours for pain. Do not take ibuprofen if you are on blood thinners or have stomach ulcers. P.O. Antibiotics - Continue antibiotics as prescribed Other: - continue IV antibiotics as ordered and handled by Infectious Disease and keep appt with Infectious Disease; continue to follow with [facility], Dr. Amalia Hailey General Notes: Culture done at ID appt--meds will be adjusted by results of infection WOUND #1: - Foot Wound Laterality: Right, Distal Cleanser: Normal Saline 1 x Per Day/30 Days Discharge Instructions: Wash your hands with soap and water. Remove old dressing, discard into plastic bag and place into trash. Cleanse the wound with Normal Saline prior to applying a clean dressing using gauze sponges, not tissues or cotton  balls. Do not scrub or use excessive force. Pat dry using gauze sponges, not tissue or cotton balls. Topical: Santyl Collagenase Ointment, 30 (gm), tube 1 x Per Day/30 Days Discharge Instructions: apply nickel thick to wound bed only Primary Dressing: SILVERCEL Antimicrobial Alginate Dressing, 1x12 (in/in) 1 x Per Day/30 Days Secondary Dressing: ABD Pad 5x9 (in/in) 1 x Per Day/30 Days Discharge Instructions: Cover with ABD pad Secured With: 55M Medipore H Soft Cloth Surgical Tape, 2x2 (in/yd) 1 x Per Day/30 Days Secured With: Kerlix Roll Sterile or Non-Sterile 6-ply 4.5x4 (yd/yd) 1 x Per Day/30 Days Discharge Instructions: Apply Kerlix as directed 1. Silver alginate and Santyl 2. Aggressive offloading 3. Go to the ED if worsening symptoms Electronic Signature(s) Signed: 12/08/2020 1:36:16 PM By: Kalman Shan DO Entered By: Kalman Shan on 12/08/2020 13:35:38 Fernando Vega ([id number]) -------------------------------------------------------------------------------- ROS/PFSH Details Patient Name: Fernando Vega Date of Service: 12/08/2020 1:00 PM Medical Record Number: [id number] Patient Account Number: [id number] Date of Birth/Sex: 1970-11-02 (50 y.o. M) Treating RN: Donnamarie Poag Primary Care Provider: Caryl Asp Other Clinician: Referring Provider: Loletha Grayer Treating Provider/Extender: Yaakov Guthrie in Treatment: 1 Information Obtained From Patient Hematologic/Lymphatic Medical History: Positive for: Anemia Respiratory Medical History: Positive for: Sleep Apnea Endocrine Medical History: Positive for: Type II Diabetes Time with diabetes: 30 years Treated with: Insulin, Oral agents Blood sugar tested every day: Yes Tested : Blood sugar testing results: Breakfast: 284 Immunizations Pneumococcal Vaccine: Received Pneumococcal Vaccination: No Implantable Devices None Family and Social History Never smoker; Marital Status - Married; Alcohol  Use: Never; Drug Use: No History; Caffeine Use: Never; Financial Concerns: No; Food, Clothing or Shelter Needs: No; Support System Lacking: No; Transportation Concerns: No Electronic Signature(s) Signed: 12/08/2020 1:36:16 PM By: Kalman Shan DO Signed: 12/08/2020 3:51:14 PM By: Donnamarie Poag Entered By: Kalman Shan on 12/08/2020 13:30:40 Fernando Vega ([id number]) -------------------------------------------------------------------------------- [place] Details Patient Name: Fernando Vega. Date of Service: 12/08/2020 Medical Record Number: [id number] Patient Account Number: [id number] Date of Birth/Sex: 09/12/1970 (50 y.o. M) Treating RN: Donnamarie Poag Primary Care Provider: Caryl Asp Other Clinician: Referring Provider: Loletha Grayer Treating Provider/Extender: Yaakov Guthrie in Treatment: 1 Diagnosis Coding ICD-10 Codes Code Description L93.790W Unspecified open wound, right foot, subsequent encounter M86.171 Other acute osteomyelitis, right ankle and foot E11.621 Type 2 diabetes mellitus with foot ulcer Facility Procedures CPT4 Code: [id number] Description: [phone] - WOUND CARE VISIT-LEV 2 EST PT Modifier: Quantity: 1 CPT4 Code: [id number] Description: 22979 - DEBRIDE W/O ANES NON SELECT Modifier: Quantity: 1 CPT4 Code: Description: ICD-10 Diagnosis Description G92.119E Unspecified open wound, right foot, subsequent encounter Modifier: Quantity: Physician Procedures CPT4 Code: [id number]  Description: 99213 - WC PHYS LEVEL 3 - EST PT Modifier: Quantity: 1 CPT4 Code: Description: ICD-10 Diagnosis Description M86.171 Other acute osteomyelitis, right ankle and foot S91.301D Unspecified open wound, right foot, subsequent encounter E11.621 Type 2 diabetes mellitus with foot ulcer Modifier: Quantity: Electronic Signature(s) Signed: 12/08/2020 1:45:42 PM By: Donnamarie Poag Signed: 12/08/2020 1:55:03 PM By: Kalman Shan DO Previous Signature: 12/08/2020 1:36:16  PM Version By: Kalman Shan DO Entered By: Donnamarie Poag on 12/08/2020 13:45:42

## 2020-12-08 NOTE — Progress Notes (Signed)
Fernando Vega, Fernando Vega ([id number]) Visit Report for 12/08/2020 Arrival Information Details Patient Name: Fernando Vega. Date of Service: 12/08/2020 1:00 PM Medical Record Number: [id number] Patient Account Number: [id number] Date of Birth/Sex: [birth year] (50 y.o. M) Treating RN: Donnamarie Poag Primary Care Maud Rubendall: Caryl Asp Other Clinician: Referring Caytlin Better: Loletha Grayer Treating Manasa Spease/Extender: Yaakov Guthrie in Treatment: 1 Visit Information History Since Last Visit Added or deleted any medications: No Patient Arrived: Ambulatory Had a fall or experienced change in No Arrival Time: 12:54 activities of daily living that may affect Accompanied By: self risk of falls: Transfer Assistance: None Hospitalized since last visit: No Patient Identification Verified: Yes Has Dressing in Place as Prescribed: Yes Secondary Verification Process Completed: Yes Pain Present Now: Yes Patient Has Alerts: Yes Patient Alerts: DIABETIC PICC right arm follows with ID Electronic Signature(s) Signed: 12/08/2020 3:51:14 PM By: Donnamarie Poag Entered By: Donnamarie Poag on 12/08/2020 12:58:52 Fernando Vega ([id number]) -------------------------------------------------------------------------------- Clinic Level of Care Assessment Details Patient Name: Fernando Vega. Date of Service: 12/08/2020 1:00 PM Medical Record Number: [id number] Patient Account Number: [id number] Date of Birth/Sex: November 05, 1970 (50 y.o. M) Treating RN: Donnamarie Poag Primary Care Ostin Mathey: Caryl Asp Other Clinician: Referring Sorin Frimpong: Loletha Grayer Treating Ritha Sampedro/Extender: Yaakov Guthrie in Treatment: 1 Clinic Level of Care Assessment Items TOOL 1 Quantity Score []  - Use when EandM and Procedure is performed on INITIAL visit 0 ASSESSMENTS - Nursing Assessment / Reassessment []  - General Physical Exam (combine w/ comprehensive assessment (listed just below) when performed on new 0 pt. evals) []   - 0 Comprehensive Assessment (HX, ROS, Risk Assessments, Wounds Hx, etc.) ASSESSMENTS - Wound and Skin Assessment / Reassessment []  - Dermatologic / Skin Assessment (not related to wound area) 0 ASSESSMENTS - Ostomy and/or Continence Assessment and Care []  - Incontinence Assessment and Management 0 []  - 0 Ostomy Care Assessment and Management (repouching, etc.) PROCESS - Coordination of Care []  - Simple Patient / Family Education for ongoing care 0 []  - 0 Complex (extensive) Patient / Family Education for ongoing care []  - 0 Staff obtains Programmer, systems, Records, Test Results / Process Orders []  - 0 Staff telephones HHA, Nursing Homes / Clarify orders / etc []  - 0 Routine Transfer to another Facility (non-emergent condition) []  - 0 Routine Hospital Admission (non-emergent condition) []  - 0 New Admissions / Biomedical engineer / Ordering NPWT, Apligraf, etc. []  - 0 Emergency Hospital Admission (emergent condition) PROCESS - Special Needs []  - Pediatric / Minor Patient Management 0 []  - 0 Isolation Patient Management []  - 0 Hearing / Language / Visual special needs []  - 0 Assessment of Community assistance (transportation, D/C planning, etc.) []  - 0 Additional assistance / Altered mentation []  - 0 Support Surface(s) Assessment (bed, cushion, seat, etc.) INTERVENTIONS - Miscellaneous []  - External ear exam 0 []  - 0 Patient Transfer (multiple staff / Civil Service fast streamer / Similar devices) []  - 0 Simple Staple / Suture removal (25 or less) []  - 0 Complex Staple / Suture removal (26 or more) []  - 0 Hypo/Hyperglycemic Management (do not check if billed separately) []  - 0 Ankle / Brachial Index (ABI) - do not check if billed separately Has the patient been seen at the hospital within the last three years: Yes Total Score: 0 Level Of Care: ____ Fernando Vega ([id number]) Electronic Signature(s) Signed: 12/08/2020 3:51:14 PM By: Donnamarie Poag Entered By: Donnamarie Poag on 12/08/2020  13:44:39 Fernando Vega ([id number]) -------------------------------------------------------------------------------- Encounter Discharge Information Details Patient Name: Fernando Vega. Date of Service:  12/08/2020 1:00 PM Medical Record Number: [id number] Patient Account Number: [id number] Date of Birth/Sex: Aug 03, 1970 (50 y.o. M) Treating RN: Donnamarie Poag Primary Care Ortencia Askari: Caryl Asp Other Clinician: Referring Natayla Cadenhead: Loletha Grayer Treating Rayvon Brandvold/Extender: Yaakov Guthrie in Treatment: 1 Encounter Discharge Information Items Discharge Condition: Stable Ambulatory Status: Ambulatory Discharge Destination: Home Transportation: Private Auto Accompanied By: self Schedule Follow-up Appointment: Yes Clinical Summary of Care: Electronic Signature(s) Signed: 12/08/2020 3:51:14 PM By: Donnamarie Poag Entered By: Donnamarie Poag on 12/08/2020 13:41:50 Fernando Vega ([id number]) -------------------------------------------------------------------------------- Lower Extremity Assessment Details Patient Name: Fernando Vega. Date of Service: 12/08/2020 1:00 PM Medical Record Number: [id number] Patient Account Number: [id number] Date of Birth/Sex: Jan 29, 1971 (50 y.o. M) Treating RN: Donnamarie Poag Primary Care Tylasia Fletchall: Caryl Asp Other Clinician: Referring Jayce Boyko: Loletha Grayer Treating Lima Chillemi/Extender: Yaakov Guthrie in Treatment: 1 Edema Assessment Assessed: [Left: No] [Right: Yes] [Left: Edema] [Right: :] Calf Left: Right: Point of Measurement: 32 cm From Medial Instep 35.5 cm Ankle Left: Right: Point of Measurement: 10 cm From Medial Instep 23.5 cm Knee To Floor Left: Right: From Medial Instep 41 cm Vascular Assessment Pulses: Dorsalis Pedis Palpable: [Right:Yes] Electronic Signature(s) Signed: 12/08/2020 3:51:14 PM By: Donnamarie Poag Entered By: Donnamarie Poag on 12/08/2020 13:10:56 Fernando Vega  ([id number]) -------------------------------------------------------------------------------- Multi Wound Chart Details Patient Name: Fernando Vega. Date of Service: 12/08/2020 1:00 PM Medical Record Number: [id number] Patient Account Number: [id number] Date of Birth/Sex: [birth year] (50 y.o. M) Treating RN: Donnamarie Poag Primary Care Mena Lienau: Caryl Asp Other Clinician: Referring Moritz Lever: Loletha Grayer Treating Cherylann Hobday/Extender: Yaakov Guthrie in Treatment: 1 Vital Signs Height(in): 67 Pulse(bpm): 108 Weight(lbs): 183 Blood Pressure(mmHg): 157/84 Body Mass Index(BMI): 29 Temperature(F): 98.5 Respiratory Rate(breaths/min): 16 Photos: [N/A:N/A] Wound Location: Right, Distal Foot N/A N/A Wounding Event: Puncture N/A N/A Primary Etiology: Open Surgical Wound N/A N/A Secondary Etiology: Diabetic Wound/Ulcer of the Lower N/A N/A Extremity Comorbid History: Anemia, Sleep Apnea, Type II N/A N/A Diabetes Date Acquired: 10/11/2020 N/A N/A Weeks of Treatment: 1 N/A N/A Wound Status: Open N/A N/A Pending Amputation on Yes N/A N/A Presentation: Measurements L x W x D (cm) 6x4x4.5 N/A N/A Area (cm) : 18.85 N/A N/A Volume (cm) : 84.823 N/A N/A % Reduction in Area: 0.00% N/A N/A % Reduction in Volume: -7.10% N/A N/A Position 1 (o'clock): 12 Maximum Distance 1 (cm): 0.8 Starting Position 1 (o'clock): 3 Ending Position 1 (o'clock): 4 Maximum Distance 1 (cm): 1 Starting Position 2 (o'clock): 9 Ending Position 2 (o'clock): 9 Maximum Distance 2 (cm): 0.5 Tunneling: Yes N/A N/A Undermining: Yes N/A N/A Classification: Full Thickness With Exposed N/A N/A Support Structures Exudate Amount: Large N/A N/A Exudate Type: Serosanguineous N/A N/A TYRA, MICHELLE R. ([id number]) Exudate Color: red, brown N/A N/A Wound Margin: Thickened N/A N/A Granulation Amount: Small (1-33%) N/A N/A Granulation Quality: Red, Pink N/A N/A Necrotic Amount: Large (67-100%) N/A N/A Necrotic  Tissue: Eschar, Adherent Slough N/A N/A Exposed Structures: Fat Layer (Subcutaneous Tissue): N/A N/A Yes Tendon: Yes Muscle: Yes Joint: Yes Bone: Yes Treatment Notes Electronic Signature(s) Signed: 12/08/2020 1:36:16 PM By: Kalman Shan DO Entered By: Kalman Shan on 12/08/2020 13:28:36 Fernando Vega ([id number]) -------------------------------------------------------------------------------- [place] Details Patient Name: Fernando Vega. Date of Service: 12/08/2020 1:00 PM Medical Record Number: [id number] Patient Account Number: [id number] Date of Birth/Sex: [birth year] (50 y.o. M) Treating RN: Donnamarie Poag Primary Care Bay Jarquin: Caryl Asp Other Clinician: Referring Ammy Lienhard: Loletha Grayer Treating Nashiya Disbrow/Extender: Yaakov Guthrie in Treatment: 1 Active Inactive Wound/Skin Impairment Nursing Diagnoses: Impaired tissue  integrity Knowledge deficit related to smoking impact on wound healing Knowledge deficit related to ulceration/compromised skin integrity Goals: Patient/caregiver will verbalize understanding of skin care regimen Date Initiated: 12/01/2020 Target Resolution Date: 12/10/2020 Goal Status: Active Ulcer/skin breakdown will have a volume reduction of 30% by week 4 Date Initiated: 12/01/2020 Target Resolution Date: 12/31/2020 Goal Status: Active Ulcer/skin breakdown will have a volume reduction of 50% by week 8 Date Initiated: 12/01/2020 Target Resolution Date: 01/31/2021 Goal Status: Active Ulcer/skin breakdown will have a volume reduction of 80% by week 12 Date Initiated: 12/01/2020 Target Resolution Date: 03/02/2021 Goal Status: Active Interventions: Assess patient/caregiver ability to obtain necessary supplies Assess patient/caregiver ability to perform ulcer/skin care regimen upon admission and as needed Assess ulceration(s) every visit Notes: Electronic Signature(s) Signed: 12/08/2020 3:51:14 PM By: Donnamarie Poag Entered By: Donnamarie Poag on 12/08/2020 13:11:29 Fernando Vega ([id number]) -------------------------------------------------------------------------------- Pain Assessment Details Patient Name: Fernando Vega. Date of Service: 12/08/2020 1:00 PM Medical Record Number: [id number] Patient Account Number: [id number] Date of Birth/Sex: 1971/01/20 (50 y.o. M) Treating RN: Donnamarie Poag Primary Care Breionna Punt: Caryl Asp Other Clinician: Referring Quadre Bristol: Loletha Grayer Treating Renton Berkley/Extender: Yaakov Guthrie in Treatment: 1 Active Problems Location of Pain Severity and Description of Pain Patient Has Paino Yes Site Locations Rate the pain. Current Pain Level: 4 Pain Management and Medication Current Pain Management: Electronic Signature(s) Signed: 12/08/2020 3:51:14 PM By: Donnamarie Poag Entered By: Donnamarie Poag on 12/08/2020 12:59:46 Fernando Vega ([id number]) -------------------------------------------------------------------------------- Patient/Caregiver Education Details Patient Name: Fernando Vega Date of Service: 12/08/2020 1:00 PM Medical Record Number: [id number] Patient Account Number: [id number] Date of Birth/Gender: [birth year] (50 y.o. M) Treating RN: Donnamarie Poag Primary Care Physician: Caryl Asp Other Clinician: Referring Physician: Loletha Grayer Treating Physician/Extender: Yaakov Guthrie in Treatment: 1 Education Assessment Education Provided To: Patient Education Topics Provided Basic Hygiene: Wound/Skin Impairment: Electronic Signature(s) Signed: 12/08/2020 3:51:14 PM By: Donnamarie Poag Entered By: Donnamarie Poag on 12/08/2020 13:26:23 Fernando Vega ([id number]) -------------------------------------------------------------------------------- Wound Assessment Details Patient Name: Fernando Vega. Date of Service: 12/08/2020 1:00 PM Medical Record Number: [id number] Patient Account Number: [id number] Date of Birth/Sex: 09/17/1970  (50 y.o. M) Treating RN: Donnamarie Poag Primary Care Taelyn Broecker: Caryl Asp Other Clinician: Referring Weslynn Ke: Loletha Grayer Treating Marianita Botkin/Extender: Yaakov Guthrie in Treatment: 1 Wound Status Wound Number: 1 Primary Etiology: Open Surgical Wound Wound Location: Right, Distal Foot Secondary Etiology: Diabetic Wound/Ulcer of the Lower Extremity Wounding Event: Puncture Wound Status: Open Date Acquired: 10/11/2020 Comorbid History: Anemia, Sleep Apnea, Type II Diabetes Weeks Of Treatment: 1 Clustered Wound: No Pending Amputation On Presentation Photos Wound Measurements Length: (cm) 6 Width: (cm) 4 Depth: (cm) 4.5 Area: (cm) 18.85 Volume: (cm) 84.823 % Reduction in Area: 0% % Reduction in Volume: -7.1% Tunneling: Yes Position (o'clock): 12 Maximum Distance: (cm) 0.8 Undermining: Yes Location 1 Starting Position (o'clock): 3 Ending Position (o'clock): 4 Maximum Distance: (cm) 1 Location 2 Starting Position (o'clock): 9 Ending Position (o'clock): 9 Maximum Distance: (cm) 0.5 Wound Description Classification: Full Thickness With Exposed Support Structures Wound Margin: Thickened Exudate Amount: Large Exudate Type: Serosanguineous Exudate Color: red, brown Foul Odor After Cleansing: No Slough/Fibrino Yes Wound Bed Granulation Amount: Small (1-33%) Exposed Structure Granulation Quality: Red, Pink Fat Layer (Subcutaneous Tissue) Exposed: Yes Necrotic Amount: Large (67-100%) Tendon Exposed: Yes Necrotic Quality: Lloyd, Adherent Slough Muscle Exposed: Yes Necrosis of Muscle: Yes Joint Exposed: Yes DWAIN, HUHN R. ([id number]) Bone Exposed: Yes Treatment Notes Wound #1 (Foot) Wound Laterality: Right, Distal Cleanser Normal Saline Discharge Instruction:  Wash your hands with soap and water. Remove old dressing, discard into plastic bag and place into trash. Cleanse the wound with Normal Saline prior to applying a clean dressing using gauze  sponges, not tissues or cotton balls. Do not scrub or use excessive force. Pat dry using gauze sponges, not tissue or cotton balls. Peri-Wound Care Topical Santyl Collagenase Ointment, 30 (gm), tube Discharge Instruction: apply nickel thick to wound bed only Primary Dressing SILVERCEL Antimicrobial Alginate Dressing, 1x12 (in/in) Secondary Dressing ABD Pad 5x9 (in/in) Discharge Instruction: Cover with ABD pad Secured With 70M [place] Surgical Tape, 2x2 (in/yd) Kerlix Roll Sterile or Non-Sterile 6-ply 4.5x4 (yd/yd) Discharge Instruction: Apply Kerlix as directed Compression Wrap Compression Stockings Add-Ons Electronic Signature(s) Signed: 12/08/2020 3:51:14 PM By: Donnamarie Poag Entered By: Donnamarie Poag on 12/08/2020 13:10:08 Fernando Vega ([id number]) -------------------------------------------------------------------------------- [place] Details Patient Name: Fernando Vega. Date of Service: 12/08/2020 1:00 PM Medical Record Number: [id number] Patient Account Number: [id number] Date of Birth/Sex: [birth year] (50 y.o. M) Treating RN: Donnamarie Poag Primary Care Loghan Kurtzman: Caryl Asp Other Clinician: Referring Crissie Aloi: Loletha Grayer Treating Armando Lauman/Extender: Yaakov Guthrie in Treatment: 1 Vital Signs Time Taken: 12:59 Temperature (F): 98.5 Height (in): 67 Pulse (bpm): 108 Weight (lbs): 183 Respiratory Rate (breaths/min): 16 Body Mass Index (BMI): 28.7 Blood Pressure (mmHg): 157/84 Reference Range: 80 - 120 mg / dl Electronic Signature(s) Signed: 12/08/2020 3:51:14 PM By: Donnamarie Poag Entered ByDonnamarie Poag on 12/08/2020 12:59:19

## 2020-12-09 ENCOUNTER — Telehealth: Payer: Self-pay | Admitting: *Deleted

## 2020-12-09 NOTE — Telephone Encounter (Signed)
We received an urgent notice from the [facility] asking [place] to get the patient in for an appointment as soon as possible.  I attempted to call the patient to schedule him an appointment.  His mailbox was full, I couldn't leave a message.

## 2020-12-11 ENCOUNTER — Encounter: Payer: Self-pay | Admitting: Internal Medicine

## 2020-12-11 ENCOUNTER — Other Ambulatory Visit: Payer: Self-pay

## 2020-12-11 ENCOUNTER — Inpatient Hospital Stay
Admission: EM | Admit: 2020-12-11 | Discharge: 2020-12-20 | DRG: 239 | Disposition: A | Discharge status: Home Health | Payer: Medicaid Other | Attending: Internal Medicine | Admitting: Internal Medicine

## 2020-12-11 DIAGNOSIS — D869 Sarcoidosis, unspecified: Secondary | ICD-10-CM | POA: Diagnosis present

## 2020-12-11 DIAGNOSIS — L97519 Non-pressure chronic ulcer of other part of right foot with unspecified severity: Secondary | ICD-10-CM | POA: Diagnosis present

## 2020-12-11 DIAGNOSIS — R030 Elevated blood-pressure reading, without diagnosis of hypertension: Secondary | ICD-10-CM | POA: Diagnosis not present

## 2020-12-11 DIAGNOSIS — D649 Anemia, unspecified: Secondary | ICD-10-CM | POA: Diagnosis present

## 2020-12-11 DIAGNOSIS — Z9889 Other specified postprocedural states: Secondary | ICD-10-CM

## 2020-12-11 DIAGNOSIS — Z833 Family history of diabetes mellitus: Secondary | ICD-10-CM | POA: Diagnosis not present

## 2020-12-11 DIAGNOSIS — U071 COVID-19: Secondary | ICD-10-CM | POA: Diagnosis present

## 2020-12-11 DIAGNOSIS — E11621 Type 2 diabetes mellitus with foot ulcer: Secondary | ICD-10-CM | POA: Diagnosis present

## 2020-12-11 DIAGNOSIS — L03115 Cellulitis of right lower limb: Secondary | ICD-10-CM | POA: Diagnosis present

## 2020-12-11 DIAGNOSIS — Z79899 Other long term (current) drug therapy: Secondary | ICD-10-CM | POA: Diagnosis not present

## 2020-12-11 DIAGNOSIS — D638 Anemia in other chronic diseases classified elsewhere: Secondary | ICD-10-CM | POA: Diagnosis present

## 2020-12-11 DIAGNOSIS — E1165 Type 2 diabetes mellitus with hyperglycemia: Secondary | ICD-10-CM | POA: Diagnosis present

## 2020-12-11 DIAGNOSIS — E1169 Type 2 diabetes mellitus with other specified complication: Secondary | ICD-10-CM | POA: Diagnosis present

## 2020-12-11 DIAGNOSIS — Z8249 Family history of ischemic heart disease and other diseases of the circulatory system: Secondary | ICD-10-CM | POA: Diagnosis not present

## 2020-12-11 DIAGNOSIS — E1152 Type 2 diabetes mellitus with diabetic peripheral angiopathy with gangrene: Secondary | ICD-10-CM | POA: Diagnosis present

## 2020-12-11 DIAGNOSIS — L089 Local infection of the skin and subcutaneous tissue, unspecified: Secondary | ICD-10-CM | POA: Diagnosis not present

## 2020-12-11 DIAGNOSIS — T148XXA Other injury of unspecified body region, initial encounter: Secondary | ICD-10-CM | POA: Diagnosis not present

## 2020-12-11 DIAGNOSIS — I96 Gangrene, not elsewhere classified: Secondary | ICD-10-CM | POA: Diagnosis present

## 2020-12-11 DIAGNOSIS — G4733 Obstructive sleep apnea (adult) (pediatric): Secondary | ICD-10-CM | POA: Diagnosis present

## 2020-12-11 DIAGNOSIS — E1142 Type 2 diabetes mellitus with diabetic polyneuropathy: Secondary | ICD-10-CM | POA: Diagnosis present

## 2020-12-11 DIAGNOSIS — E11628 Type 2 diabetes mellitus with other skin complications: Secondary | ICD-10-CM | POA: Diagnosis present

## 2020-12-11 DIAGNOSIS — G4730 Sleep apnea, unspecified: Secondary | ICD-10-CM | POA: Insufficient documentation

## 2020-12-11 DIAGNOSIS — Z794 Long term (current) use of insulin: Secondary | ICD-10-CM | POA: Diagnosis not present

## 2020-12-11 DIAGNOSIS — M869 Osteomyelitis, unspecified: Secondary | ICD-10-CM | POA: Diagnosis present

## 2020-12-11 DIAGNOSIS — I10 Essential (primary) hypertension: Secondary | ICD-10-CM | POA: Diagnosis present

## 2020-12-11 LAB — COMPREHENSIVE METABOLIC PANEL
ALT: 8 U/L (ref 0–44)
AST: 17 U/L (ref 15–41)
Albumin: 3.4 g/dL — ABNORMAL LOW (ref 3.5–5.0)
Alkaline Phosphatase: 78 U/L (ref 38–126)
Anion gap: 11 (ref 5–15)
BUN: 11 mg/dL (ref 6–20)
CO2: 26 mmol/L (ref 22–32)
Calcium: 8.6 mg/dL — ABNORMAL LOW (ref 8.9–10.3)
Chloride: 100 mmol/L (ref 98–111)
Creatinine, Ser: 0.74 mg/dL (ref 0.61–1.24)
GFR, Estimated: >60 mL/min (ref >60)
Glucose, Bld: 215 mg/dL — ABNORMAL HIGH (ref 70–99)
Potassium: 3.8 mmol/L (ref 3.5–5.1)
Sodium: 137 mmol/L (ref 135–145)
Total Bilirubin: 0.8 mg/dL (ref 0.3–1.2)
Total Protein: 7.7 g/dL (ref 6.5–8.1)

## 2020-12-11 LAB — CBC WITH DIFFERENTIAL/PLATELET
Abs Immature Granulocytes: 0.05 10*3/uL (ref 0.00–0.07)
Basophils Absolute: 0 10*3/uL (ref 0.0–0.1)
Basophils Relative: 0 %
Eosinophils Absolute: 0.1 10*3/uL (ref 0.0–0.5)
Eosinophils Relative: 1 %
HCT: 34 % — ABNORMAL LOW (ref 39.0–52.0)
Hemoglobin: 10.8 g/dL — ABNORMAL LOW (ref 13.0–17.0)
Immature Granulocytes: 1 %
Lymphocytes Relative: 26 %
Lymphs Abs: 2.2 10*3/uL (ref 0.7–4.0)
MCH: 27.7 pg (ref 26.0–34.0)
MCHC: 31.8 g/dL (ref 30.0–36.0)
MCV: 87.2 fL (ref 80.0–100.0)
Monocytes Absolute: 0.8 10*3/uL (ref 0.1–1.0)
Monocytes Relative: 9 %
Neutro Abs: 5.3 10*3/uL (ref 1.7–7.7)
Neutrophils Relative %: 63 %
Platelets: 392 10*3/uL (ref 150–400)
RBC: 3.9 MIL/uL — ABNORMAL LOW (ref 4.22–5.81)
RDW: 13.4 % (ref 11.5–15.5)
WBC: 8.4 10*3/uL (ref 4.0–10.5)
nRBC: 0 % (ref 0.0–0.2)

## 2020-12-11 LAB — AEROBIC CULTURE W GRAM STAIN (SUPERFICIAL SPECIMEN): Gram Stain: NONE SEEN

## 2020-12-11 LAB — RESP PANEL BY RT-PCR (FLU A&B, COVID) ARPGX2
Influenza A by PCR: NEGATIVE
Influenza B by PCR: NEGATIVE
SARS Coronavirus 2 by RT PCR: POSITIVE — AB

## 2020-12-11 LAB — CBG MONITORING, ED: Glucose-Capillary: 279 mg/dL — ABNORMAL HIGH (ref 70–99)

## 2020-12-11 LAB — LACTIC ACID, PLASMA: Lactic Acid, Venous: 0.7 mmol/L (ref 0.5–1.9)

## 2020-12-11 MED ORDER — INSULIN ASPART 100 UNIT/ML IJ SOLN
0.0000 [IU] | Freq: Three times a day (TID) | INTRAMUSCULAR | Status: DC
  Administered 2020-12-12: 3 [IU] via SUBCUTANEOUS
  Administered 2020-12-13: 2 [IU] via SUBCUTANEOUS
  Administered 2020-12-13: 3 [IU] via SUBCUTANEOUS
  Administered 2020-12-14: 5 [IU] via SUBCUTANEOUS
  Administered 2020-12-14: 2 [IU] via SUBCUTANEOUS
  Administered 2020-12-14: 7 [IU] via SUBCUTANEOUS
  Administered 2020-12-15 (×3): 3 [IU] via SUBCUTANEOUS
  Administered 2020-12-16: 5 [IU] via SUBCUTANEOUS
  Filled 2020-12-11 (×10): qty 1

## 2020-12-11 MED ORDER — SODIUM CHLORIDE 0.9 % IV SOLN: INTRAVENOUS | Status: AC

## 2020-12-11 MED ORDER — INSULIN ASPART 100 UNIT/ML IJ SOLN
0.0000 [IU] | Freq: Every day | INTRAMUSCULAR | Status: DC
Start: 2020-12-11 — End: 2020-12-16
  Administered 2020-12-11: 3 [IU] via SUBCUTANEOUS
  Administered 2020-12-12: 2 [IU] via SUBCUTANEOUS
  Administered 2020-12-13: 4 [IU] via SUBCUTANEOUS
  Administered 2020-12-14: 3 [IU] via SUBCUTANEOUS
  Administered 2020-12-15: 2 [IU] via SUBCUTANEOUS
  Filled 2020-12-11 (×5): qty 1

## 2020-12-11 MED ORDER — VANCOMYCIN HCL 1250 MG/250ML IV SOLN
1250.0000 mg | Freq: Two times a day (BID) | INTRAVENOUS | Status: AC
Start: 2020-12-12 — End: 2020-12-15
  Administered 2020-12-12 – 2020-12-15 (×6): 1250 mg via INTRAVENOUS
  Filled 2020-12-11 (×7): qty 250

## 2020-12-11 MED ORDER — HYDRALAZINE HCL 20 MG/ML IJ SOLN: 10.0000 mg | Freq: Four times a day (QID) | INTRAMUSCULAR | Status: DC | PRN

## 2020-12-11 MED ORDER — VANCOMYCIN HCL IN DEXTROSE 1-5 GM/200ML-% IV SOLN
1000.0000 mg | Freq: Once | INTRAVENOUS | Status: AC
  Administered 2020-12-11: 1000 mg via INTRAVENOUS
  Filled 2020-12-11: qty 200

## 2020-12-11 MED ORDER — VANCOMYCIN HCL 750 MG/150ML IV SOLN
750.0000 mg | Freq: Once | INTRAVENOUS | Status: AC
  Administered 2020-12-12: 750 mg via INTRAVENOUS
  Filled 2020-12-11 (×2): qty 150

## 2020-12-11 MED ORDER — PANTOPRAZOLE SODIUM 40 MG IV SOLR
40.0000 mg | INTRAVENOUS | Status: DC
  Administered 2020-12-12: 40 mg via INTRAVENOUS
  Filled 2020-12-11: qty 40

## 2020-12-11 MED ORDER — HEPARIN SODIUM (PORCINE) 5000 UNIT/ML IJ SOLN
5000.0000 [IU] | Freq: Three times a day (TID) | INTRAMUSCULAR | Status: DC
Start: 2020-12-11 — End: 2020-12-17
  Administered 2020-12-11 – 2020-12-17 (×16): 5000 [IU] via SUBCUTANEOUS
  Filled 2020-12-11 (×16): qty 1

## 2020-12-11 MED ORDER — ALBUTEROL SULFATE (2.5 MG/3ML) 0.083% IN NEBU: 2.5000 mg | INHALATION_SOLUTION | Freq: Four times a day (QID) | RESPIRATORY_TRACT | Status: DC | PRN

## 2020-12-11 MED ORDER — PIPERACILLIN-TAZOBACTAM 3.375 G IVPB
3.3750 g | Freq: Three times a day (TID) | INTRAVENOUS | Status: DC
  Administered 2020-12-12 (×2): 3.375 g via INTRAVENOUS
  Filled 2020-12-11 (×2): qty 50

## 2020-12-11 MED ORDER — GABAPENTIN 100 MG PO CAPS
100.0000 mg | ORAL_CAPSULE | Freq: Every day | ORAL | Status: DC
  Administered 2020-12-11 – 2020-12-19 (×9): 100 mg via ORAL
  Filled 2020-12-11 (×9): qty 1

## 2020-12-11 NOTE — Progress Notes (Signed)
Pharmacy Antibiotic Note  Fernando Vega is a 50 y.o. male admitted on 12/11/2020 with  wound infection .  Pharmacy has been consulted for Zosyn dosing.  Plan: Zosyn 3.375g IV q8h (4 hour infusion).  Height: 5\' 6"  (167.6 cm) Weight: 84 kg (185 lb 3 oz) IBW/kg (Calculated) : 63.8  Temp (24hrs), Avg:98.2 F (36.8 C), Min:98.2 F (36.8 C), Max:98.2 F (36.8 C)  Recent Labs  Lab 12/07/20 1305 12/11/20 1835  WBC 6.1 8.4  CREATININE 0.67 0.74  LATICACIDVEN  --  0.7    Estimated Creatinine Clearance: 109.8 mL/min (by C-G formula based on SCr of 0.74 mg/dL).    No Known Allergies  Antimicrobials this admission:   >>    >>   Dose adjustments this admission:   Microbiology results:  BCx:   UCx:   Sputum:    MRSA PCR:   Thank you for allowing pharmacy to be a part of this patient's care.  Katherine Tout D 12/11/2020 10:42 PM

## 2020-12-11 NOTE — Consult Note (Signed)
Pharmacy Antibiotic Note  Fernando Vega is a 50 y.o. 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 Pharmacy has been consulted for vancomycin dosing.  Plan: Vancomycin 1750 mg IV loading dose, followed by 1250 mg IV q12h  Goal AUC 400-550  Est AUC: 484.6 Est Cmax: 32.3 Est Cmin: 13.2 Calculated with SCr 0.8 Monitor clinical picture, renal function, and vancomycin levels at steady state F/U C&S, abx deescalation / LOT   Height: 5\' 6"  (167.6 cm) Weight: 84 kg (185 lb 3 oz) IBW/kg (Calculated) : 63.8  Temp (24hrs), Avg:98.2 F (36.8 C), Min:98.2 F (36.8 C), Max:98.2 F (36.8 C)  Recent Labs  Lab 12/07/20 1305 12/11/20 1835  WBC 6.1 8.4  CREATININE 0.67 0.74  LATICACIDVEN  --  0.7    Estimated Creatinine Clearance: 109.8 mL/min (by C-G formula based on SCr of 0.74 mg/dL).    No Known Allergies  Antimicrobials this admission: 10/1 vancomycin >>   Dose adjustments this admission: N/A  Microbiology results: No cultures ordered at this time   Thank you for allowing pharmacy to be a part of this patient's care.  Darnelle Bos, PharmD 12/11/2020 10:05 PM

## 2020-12-11 NOTE — ED Notes (Signed)
Pt covid swab obtained

## 2020-12-11 NOTE — ED Provider Notes (Signed)
[facility] Emergency Department Provider Note   ____________________________________________   I have reviewed the triage vital signs and the nursing notes.   HISTORY  Chief Complaint Wound Infection   History limited by: Not Limited   HPI Fernando Vega is a 50 y.o. male who presents to the emergency department today because of concern for right foot infection.  The patient did have amputation performed a little over a month ago on his right second and third digit secondary to infection.  He has been on IV antibiotics through PICC line since then.  He does follow-up at wound care center.  He states that he came in today because the pain has gotten worse.  He was instructed by the wound care center to come in if it got worse or changed at all.  He denies any fevers, nausea or vomiting.   Records reviewed. Per medical record review patient has a history of DM.   Past Medical History:  Diagnosis Date   Diabetes mellitus without complication ([place])    Neuropathy    Sarcoidosis    Sleep apnea     Patient Active Problem List   Diagnosis Date Noted   Encounter to establish care 12/02/2020   Leukocytosis    Anemia of chronic disease    Puncture wound 11/01/2020   Type 2 diabetes mellitus with peripheral neuropathy ([place]) 11/01/2020   Necrotizing soft tissue infection 11/01/2020   Diabetic foot infection [facility])     Past Surgical History:  Procedure Laterality Date   AMPUTATION Right 11/05/2020   Procedure: AMPUTATION RAY-Partial Ray Amp 2nd & 3rd Digit;  Surgeon: Edrick Kins, DPM;  Location: [facility] ORS;  Service: Podiatry;  Laterality: Right;   APPLICATION OF WOUND VAC Right 11/09/2020   Procedure: APPLICATION OF WOUND VAC;  Surgeon: Edrick Kins, DPM;  Location: [facility] ORS;  Service: Podiatry;  Laterality: Right;   INCISION AND DRAINAGE Right 11/01/2020   Procedure: INCISION AND DRAINAGE-Right Foot;  Surgeon: Criselda Peaches, DPM;  Location: [facility] ORS;   Service: Podiatry;  Laterality: Right;   IRRIGATION AND DEBRIDEMENT FOOT Right 11/09/2020   Procedure: IRRIGATION AND DEBRIDEMENT FOOT;  Surgeon: Edrick Kins, DPM;  Location: [facility] ORS;  Service: Podiatry;  Laterality: Right;    Prior to Admission medications   Medication Sig Start Date End Date Taking? Authorizing Provider  albuterol (VENTOLIN HFA) 108 (90 Base) MCG/ACT inhaler Inhale 2 puffs into the lungs once every 6 (six) hours as needed for wheezing or shortness of breath. 11/12/20   Loletha Grayer, MD  ascorbic acid (VITAMIN C) 250 MG tablet Take 1 tablet (250 mg total) by mouth once daily. 11/13/20   Loletha Grayer, MD  ceFAZolin (ANCEF) IVPB Inject 2 g into the vein every 8 (eight) hours. Indication:  MSSA and GBS diabetic foot infection and osteomyelitis First Dose: Yes Last Day of Therapy:  12/12/2020 Labs - Once weekly:  CBC/D, CMP, ESR and CRP Please pull PIC at completion of IV antibiotics Fax weekly labs to [phone] Method of administration: IV Push Method of administration may be changed at the discretion of home infusion pharmacist based upon assessment of the patient and/or caregiver's ability to self-administer the medication ordered. 11/12/20 12/14/20  Loletha Grayer, MD  collagenase (SANTYL) ointment Apply 1 application topically daily. Place on right foot wound    [provider]  gabapentin (NEURONTIN) 100 MG capsule Take 1 capsule (100 mg total) by mouth once daily at bedtime. 12/02/20   Iloabachie,  Chioma E, NP  Insulin Glargine (BASAGLAR KWIKPEN) 100 UNIT/ML Inject 10 Units into the skin daily. 11/16/20   Wieting, Richard, MD  insulin lispro (HUMALOG KWIKPEN) 100 UNIT/ML KwikPen Inject 5 Units into the skin 3 (three) times daily before meals. Sliding scale: 201-250 add 1 unit; 251-300 add 2 units; 301-350 add 3 units; 351-400 add 4 units; If over 400 call clinic if closed go to the Emergency room. 12/02/20   Iloabachie, Chioma E, NP  Insulin Pen Needle 32G X 6  MM MISC USE AS DIRECTED WITH INSULIN PENS. 11/12/20   Loletha Grayer, MD  Multiple Vitamin (MULTIVITAMIN WITH MINERALS) TABS tablet Take 1 tablet by mouth once daily. 11/13/20   Loletha Grayer, MD  sodium chloride flush (NS) 0.9 % SOLN 20 mLs by Intracatheter route as needed (flush picc line after antibiotics). 11/12/20   Loletha Grayer, MD    Allergies Patient has no known allergies.  Family History  Problem Relation Age of Onset   Hypertension Mother     Social History Social History   Tobacco Use   Smoking status: Never   Smokeless tobacco: Never  Substance Use Topics   Alcohol use: No   Drug use: No    Review of Systems Constitutional: No fever/chills Eyes: No visual changes. ENT: No sore throat. Cardiovascular: Denies chest pain. Respiratory: Denies shortness of breath. Gastrointestinal: No abdominal pain.  No nausea, no vomiting.  No diarrhea.   Genitourinary: Negative for dysuria. Musculoskeletal: Positive for right foot pain. Skin: Negative for rash. Neurological: Negative for headaches, focal weakness or numbness.  ____________________________________________   PHYSICAL EXAM:  VITAL SIGNS: ED Triage Vitals [12/11/20 1833]  Enc Vitals Group     BP (!) 164/93     Pulse Rate 100     Resp 18     Temp 98.2 F (36.8 C)     Temp Source Oral     SpO2 96 %     Weight 185 lb 3 oz (84 kg)     Height _0  (1.676 m)     Head Circumference      Peak Flow      Pain Score 9   Constitutional: Alert and oriented.  Eyes: Conjunctivae are normal.  ENT      Head: Normocephalic and atraumatic.      Nose: No congestion/rhinnorhea.      Mouth/Throat: Mucous membranes are moist.      Neck: No stridor. Hematological/Lymphatic/Immunilogical: No cervical lymphadenopathy. Cardiovascular: Normal rate, regular rhythm.  No murmurs, rubs, or gallops.  Respiratory: Normal respiratory effort without tachypnea nor retractions. Breath sounds are clear and equal bilaterally. No  wheezes/rales/rhonchi. Gastrointestinal: Soft and non tender. No rebound. No guarding.  Genitourinary: Deferred Musculoskeletal: s/p right 2nd and 3rd toe amputation. Wound with surrounding erythema. Concern for pus in wound.  Neurologic:  Normal speech and language. No gross focal neurologic deficits are appreciated.  Skin:  Skin is warm, dry and intact. No rash noted. Psychiatric: Mood and affect are normal. Speech and behavior are normal. Patient exhibits appropriate insight and judgment.  ____________________________________________    LABS (pertinent positives/negatives)  CMP wnl except glu 215, ca 8.6, alb 3.4 Lactic acid 0.7 CBC wbc 8.4, hgb 10.8, plt 392  ____________________________________________   EKG  None  ____________________________________________    RADIOLOGY  None  ____________________________________________   PROCEDURES  Procedures  ____________________________________________   INITIAL IMPRESSION / ASSESSMENT AND PLAN / ED COURSE  Pertinent labs & imaging results that were available during my care  of the patient were reviewed by me and considered in my medical decision making (see chart for details).   Patient presented to the emergency department today because of concerns for worsening right foot infection.  Patient is on IV antibiotics at home through a PICC line.  Recent MRI is concerning for osteomyelitis and remaining second and third metatarsal as well as fourth metatarsal.  Given this finding will plan on admission for IV antibiotics and podiatry evaluation.  ____________________________________________   FINAL CLINICAL IMPRESSION(S) / ED DIAGNOSES  Final diagnoses:  Wound infection  Osteomyelitis, unspecified site, unspecified type [facility])     Note: This dictation was prepared with Dragon dictation. Any transcriptional errors that result from this process are unintentional     Nance Pear, MD 12/11/20 2123

## 2020-12-11 NOTE — ED Triage Notes (Signed)
Pt comes pov with recent toe amputation and recurrent infection. Pt has been on antibiotics since the surgery and has picc line but states foot has gotten more red, swollen and painful over the past couple of days.

## 2020-12-11 NOTE — H&P (Signed)
History and Physical    Fernando Vega MOQ:[id number] DOB: 03-Aug-1970 DOA: 12/11/2020  PCP: Langston Reusing, NP    Patient coming from:  Home.    Chief Complaint:  Osteomyelitis.   HPI: Fernando Vega is a 50 y.o. male seen in ed with complaints of worsening drainage erythema on right foot.  Patient was seen by wound care clinic on 12/08/2020, and patient has been receiving antibiotics via his PICC line the wound has gotten swollen and painful.. Pt had  MRI on the 12/07/2020, and was found to have Osteomyelitis. Pt sees Dr.Evans podiatry and is [facility]. Pt states he has cut down pasta and potato but his sugars are still staying high. Pt denies any other complaints and ros is negative. Pt coming to [place] from home lives with wife. NEVER SMOKED, although he has had secondhand exposure since childhood as both parents were smokers, no alcohol or drugs.  Father has a strong history of heart disease and died from a massive MI, mom is alive.   Patient was admitted for 11/01/2020 - 11/16/2020 hospitalization until amputation for necrotizing soft tissue infection of the right foot involving the right second MTP.  Patient was growing staph aureus on the wound culture, and was discharged after amputation IV antibiotics with a wound VAC, home health services were not able to be arranged due to insurance issues and wound VAC To be removed.  Patient did have a PICC line that was placed and patient was on Ancef 2 g every 8 hours until 12/12/2020 making it a total of 6 weeks.  Patient was seen by infectious disease on 12/07/2020.  Patient was also started on clindamycin by the wound care clinic on 12/01/2020.  Patient had a specialist hospital follow-up visit had no fever and was doing well. MSSA and GBS in the wound culture from surgery on 11/01/20- has been on cefazolin IV which he will complete tomorrow on 10/2. Pt has past medical history of diabetes mellitus type 2, diabetic foot infection, diabetic neuropathy,  sarcoidosis, sleep apnea.  Media Information Document Information  Photos    12/07/2020 12:09  Attached To:  Office Visit on 12/07/20 with Tsosie Billing, MD   Source Information  Tsosie Billing, MD  [facility]     ED Course:  Vitals:   12/11/20 2115 12/11/20 2130 12/11/20 2200 12/11/20 2230  BP: (!) 165/95 (!) 174/93 (!) 186/102 (!) 165/110  Pulse: 94 91 96 95  Resp:      Temp:      TempSrc:      SpO2: 97% 98% 97% 97%  Weight:      Height:      In the ED pt is alert awake afebrile and hypertensive. Labs are stable with normal white count, and pt does not meet SIRs/ sepsis criteria and has normal lactic. Last a1c was 10/2020 and was above 11.  Pt given vancomycin in ED.Covid pending.    Review of Systems:  Review of Systems  Constitutional: Negative.   HENT: Negative.    Eyes: Negative.   Respiratory:  Positive for cough.   Cardiovascular: Negative.   Gastrointestinal: Negative.   Genitourinary: Negative.   Musculoskeletal:  Positive for joint pain.  Skin: Negative.        Ulcer/erythema/purulent drainage.  Please see image attached.  Neurological: Negative.   Endo/Heme/Allergies: Negative.   Psychiatric/Behavioral: Negative.      Past Medical History:  Diagnosis Date   Diabetes mellitus without complication ([place])  Neuropathy    Sarcoidosis    Sleep apnea     Past Surgical History:  Procedure Laterality Date   AMPUTATION Right 11/05/2020   Procedure: AMPUTATION RAY-Partial Ray Amp 2nd & 3rd Digit;  Surgeon: Edrick Kins, DPM;  Location: [facility] ORS;  Service: Podiatry;  Laterality: Right;   APPLICATION OF WOUND VAC Right 11/09/2020   Procedure: APPLICATION OF WOUND VAC;  Surgeon: Edrick Kins, DPM;  Location: [facility] ORS;  Service: Podiatry;  Laterality: Right;   INCISION AND DRAINAGE Right 11/01/2020   Procedure: INCISION AND DRAINAGE-Right Foot;  Surgeon: Criselda Peaches, DPM;  Location: [facility] ORS;  Service: Podiatry;   Laterality: Right;   IRRIGATION AND DEBRIDEMENT FOOT Right 11/09/2020   Procedure: IRRIGATION AND DEBRIDEMENT FOOT;  Surgeon: Edrick Kins, DPM;  Location: [facility] ORS;  Service: Podiatry;  Laterality: Right;     reports that he has never smoked. He has never used smokeless tobacco. He reports that he does not drink alcohol and does not use drugs.  No Known Allergies  Family History  Problem Relation Age of Onset   Hypertension Mother    Diabetes type II Father    Parkinson's disease Father    Heart disease Father     Prior to Admission medications   Medication Sig Start Date End Date Taking? Authorizing Provider  albuterol (VENTOLIN HFA) 108 (90 Base) MCG/ACT inhaler Inhale 2 puffs into the lungs once every 6 (six) hours as needed for wheezing or shortness of breath. 11/12/20   Loletha Grayer, MD  ascorbic acid (VITAMIN C) 250 MG tablet Take 1 tablet (250 mg total) by mouth once daily. 11/13/20   Loletha Grayer, MD  ceFAZolin (ANCEF) IVPB Inject 2 g into the vein every 8 (eight) hours. Indication:  MSSA and GBS diabetic foot infection and osteomyelitis First Dose: Yes Last Day of Therapy:  12/12/2020 Labs - Once weekly:  CBC/D, CMP, ESR and CRP Please pull PIC at completion of IV antibiotics Fax weekly labs to [phone] Method of administration: IV Push Method of administration may be changed at the discretion of home infusion pharmacist based upon assessment of the patient and/or caregiver's ability to self-administer the medication ordered. 11/12/20 12/14/20  Loletha Grayer, MD  collagenase (SANTYL) ointment Apply 1 application topically daily. Place on right foot wound    [provider]  gabapentin (NEURONTIN) 100 MG capsule Take 1 capsule (100 mg total) by mouth once daily at bedtime. 12/02/20   Iloabachie, Chioma E, NP  Insulin Glargine (BASAGLAR KWIKPEN) 100 UNIT/ML Inject 10 Units into the skin daily. 11/16/20   Wieting, Richard, MD  insulin lispro (HUMALOG KWIKPEN)  100 UNIT/ML KwikPen Inject 5 Units into the skin 3 (three) times daily before meals. Sliding scale: 201-250 add 1 unit; 251-300 add 2 units; 301-350 add 3 units; 351-400 add 4 units; If over 400 call clinic if closed go to the Emergency room. 12/02/20   Iloabachie, Chioma E, NP  Insulin Pen Needle 32G X 6 MM MISC USE AS DIRECTED WITH INSULIN PENS. 11/12/20   Loletha Grayer, MD  Multiple Vitamin (MULTIVITAMIN WITH MINERALS) TABS tablet Take 1 tablet by mouth once daily. 11/13/20   Loletha Grayer, MD  sodium chloride flush (NS) 0.9 % SOLN 20 mLs by Intracatheter route as needed (flush picc line after antibiotics). 11/12/20   Loletha Grayer, MD    Physical Exam: Vitals:   12/11/20 2115 12/11/20 2130 12/11/20 2200 12/11/20 2230  BP: (!) 165/95 (!) 174/93 (!) 186/102 (!) 165/110  Pulse: 94 91 96 95  Resp:      Temp:      TempSrc:      SpO2: 97% 98% 97% 97%  Weight:      Height:       Physical Exam Vitals and nursing note reviewed.  Constitutional:      General: He is not in acute distress.    Appearance: He is not ill-appearing, toxic-appearing or diaphoretic.  HENT:     Head: Normocephalic and atraumatic.     Right Ear: External ear normal.     Left Ear: External ear normal.     Nose: Nose normal.     Mouth/Throat:     Mouth: Mucous membranes are moist.  Eyes:     Extraocular Movements: Extraocular movements intact.     Pupils: Pupils are equal, round, and reactive to light.  Cardiovascular:     Rate and Rhythm: Normal rate and regular rhythm.     Pulses: Normal pulses.     Heart sounds: Normal heart sounds.  Pulmonary:     Effort: Pulmonary effort is normal.     Breath sounds: Normal breath sounds.  Abdominal:     General: Bowel sounds are normal. There is no distension.     Palpations: Abdomen is soft. There is no mass.     Tenderness: There is no abdominal tenderness. There is no guarding.     Hernia: No hernia is present.  Musculoskeletal:     Right lower leg: No edema.      Left lower leg: No edema.  Skin:    General: Skin is warm.  Neurological:     General: No focal deficit present.     Mental Status: He is alert and oriented to person, place, and time.  Psychiatric:        Mood and Affect: Mood normal.        Behavior: Behavior normal.   Labs on Admission: I have personally reviewed following labs and imaging studies  No results for input(s): CKTOTAL, CKMB, TROPONINI in the last 72 hours. Lab Results  Component Value Date   WBC 8.4 12/11/2020   HGB 10.8 (L) 12/11/2020   HCT 34.0 (L) 12/11/2020   MCV 87.2 12/11/2020   PLT 392 12/11/2020    Recent Labs  Lab 12/11/20 1835  NA 137  K 3.8  CL 100  CO2 26  BUN 11  CREATININE 0.74  CALCIUM 8.6*  PROT 7.7  BILITOT 0.8  ALKPHOS 78  ALT 8  AST 17  GLUCOSE 215*    EKG: Independently reviewed.  None  Imaging: MRI 12/08/2020 IMPRESSION: 1. Surgical changes from recent amputation of the second and third toes and proximal second and third metatarsals. 2. Findings consistent with osteomyelitis involving the remaining second and third metatarsals and also the fourth metatarsal. 3. Associated severe myofasciitis, cellulitis and multiple small abscesses. 4. Patchy abnormal T1 and T2 signal intensity in the first metatarsal and first proximal phalanx may be reactive change and not likely osteomyelitis.  Assessment/Plan Principal Problem:   Diabetic foot infection ([place]) Active Problems:   Osteomyelitis due to type 2 diabetes mellitus (HCC)   Type 2 diabetes mellitus with peripheral neuropathy (H[facility])   Elevated blood pressure reading   Anemia   Sleep apnea   Diabetic foot infection/osteomyelitis: We will continue patient on vancomycin with pharmacy consult  and zosyn. Podiatry consult .  ID consult per a.m. team.   Wound care consult. ABI. Diabetes education.  DM II;  SSI/ glycemic rpotocol and a1c.  Elevated BP reading:  PRN hydralazine and start ACEI for nephroprotection and  htn if present.   Anemia: low hemoglobin since 10/2020. We will start iv ppi.  Suspect blood loss from wound,. Will obtain stool guaiac. Patient denies any NSAIDs ibuprofen and HPI complaints no hematochezia no melena no nausea no vomiting.  OSA: CPAP per home setting.   DVT prophylaxis:  Heparin    Code Status:  Full Code    Family Communication:  Holger, Sokolowski (Spouse)  [phone] (Mobile)   Disposition Plan:  Home    Consults called:  Podiatry- Dr.Adam McDonald.   Admission status: Inpatient     Para Skeans MD [facility] [phone] How to contact the [facility] Attending or Consulting provider [place] or covering provider during after hours [place], for this patient.    Check the care team in [facility] and look for a) attending/consulting [facility] provider listed and b) the [facility] team listed Log into [email or web address] and use [place]'s universal password to access. If you do not have the password, please contact the hospital operator. Locate the [facility] provider you are looking for under [facility] and page to a number that you can be directly reached. If you still have difficulty reaching the provider, please page the [facility] (Director on Call) for the Hospitalists listed on amion for assistance. [email or web address] Password [facility] 12/11/2020, 10:37 PM

## 2020-12-11 NOTE — ED Notes (Signed)
One set of cultures sent from left hand if needed

## 2020-12-11 NOTE — ED Notes (Signed)
PT given sandwich and gingerale per md verbal. Pt resting in bed no needs at this time.

## 2020-12-12 ENCOUNTER — Inpatient Hospital Stay: Payer: Medicaid Other

## 2020-12-12 LAB — COMPREHENSIVE METABOLIC PANEL
ALT: 7 U/L (ref 0–44)
AST: 14 U/L — ABNORMAL LOW (ref 15–41)
Albumin: 3.1 g/dL — ABNORMAL LOW (ref 3.5–5.0)
Alkaline Phosphatase: 70 U/L (ref 38–126)
Anion gap: 9 (ref 5–15)
BUN: 10 mg/dL (ref 6–20)
CO2: 30 mmol/L (ref 22–32)
Calcium: 8.7 mg/dL — ABNORMAL LOW (ref 8.9–10.3)
Chloride: 102 mmol/L (ref 98–111)
Creatinine, Ser: 0.74 mg/dL (ref 0.61–1.24)
GFR, Estimated: >60 mL/min (ref >60)
Glucose, Bld: 158 mg/dL — ABNORMAL HIGH (ref 70–99)
Potassium: 3.7 mmol/L (ref 3.5–5.1)
Sodium: 141 mmol/L (ref 135–145)
Total Bilirubin: 0.7 mg/dL (ref 0.3–1.2)
Total Protein: 6.8 g/dL (ref 6.5–8.1)

## 2020-12-12 LAB — GLUCOSE, CAPILLARY
Glucose-Capillary: 157 mg/dL — ABNORMAL HIGH (ref 70–99)
Glucose-Capillary: 134 mg/dL — ABNORMAL HIGH (ref 70–99)
Glucose-Capillary: 203 mg/dL — ABNORMAL HIGH (ref 70–99)
Glucose-Capillary: 220 mg/dL — ABNORMAL HIGH (ref 70–99)

## 2020-12-12 LAB — CBC
HCT: 32.5 % — ABNORMAL LOW (ref 39.0–52.0)
Hemoglobin: 10 g/dL — ABNORMAL LOW (ref 13.0–17.0)
MCH: 27.4 pg (ref 26.0–34.0)
MCHC: 30.8 g/dL (ref 30.0–36.0)
MCV: 89 fL (ref 80.0–100.0)
Platelets: 376 10*3/uL (ref 150–400)
RBC: 3.65 MIL/uL — ABNORMAL LOW (ref 4.22–5.81)
RDW: 13.2 % (ref 11.5–15.5)
WBC: 7.2 10*3/uL (ref 4.0–10.5)
nRBC: 0 % (ref 0.0–0.2)

## 2020-12-12 LAB — PROTIME-INR
INR: 1.1 (ref 0.8–1.2)
Prothrombin Time: 14.2 seconds (ref 11.4–15.2)

## 2020-12-12 LAB — C-REACTIVE PROTEIN: CRP: 5.2 mg/dL — ABNORMAL HIGH (ref <1.0)

## 2020-12-12 LAB — LACTIC ACID, PLASMA: Lactic Acid, Venous: 1 mmol/L (ref 0.5–1.9)

## 2020-12-12 LAB — APTT: aPTT: 35 seconds (ref 24–36)

## 2020-12-12 MED ORDER — PANTOPRAZOLE SODIUM 40 MG PO TBEC
40.0000 mg | DELAYED_RELEASE_TABLET | Freq: Every day | ORAL | Status: DC
  Administered 2020-12-12 – 2020-12-20 (×8): 40 mg via ORAL
  Filled 2020-12-12 (×8): qty 1

## 2020-12-12 MED ORDER — SODIUM CHLORIDE 0.9 % IV SOLN
1.0000 g | Freq: Three times a day (TID) | INTRAVENOUS | Status: AC
  Administered 2020-12-12 – 2020-12-17 (×17): 1 g via INTRAVENOUS
  Filled 2020-12-12 (×19): qty 1

## 2020-12-12 MED ORDER — GLUCERNA SHAKE PO LIQD
237.0000 mL | Freq: Two times a day (BID) | ORAL | Status: DC
  Administered 2020-12-12 – 2020-12-19 (×7): 237 mL via ORAL

## 2020-12-12 MED ORDER — ADULT MULTIVITAMIN W/MINERALS CH
1.0000 | ORAL_TABLET | Freq: Every day | ORAL | Status: DC
  Administered 2020-12-12 – 2020-12-20 (×8): 1 via ORAL
  Filled 2020-12-12 (×8): qty 1

## 2020-12-12 MED ORDER — AMLODIPINE BESYLATE 5 MG PO TABS
5.0000 mg | ORAL_TABLET | Freq: Every day | ORAL | Status: DC
  Administered 2020-12-12 – 2020-12-15 (×4): 5 mg via ORAL
  Filled 2020-12-12 (×4): qty 1

## 2020-12-12 MED ORDER — CHLORHEXIDINE GLUCONATE CLOTH 2 % EX PADS
6.0000 | MEDICATED_PAD | Freq: Every day | CUTANEOUS | Status: DC
  Administered 2020-12-12 – 2020-12-20 (×9): 6 via TOPICAL

## 2020-12-12 MED ORDER — ENSURE MAX PROTEIN PO LIQD
11.0000 [oz_av] | Freq: Every day | ORAL | Status: DC
  Administered 2020-12-12 – 2020-12-17 (×4): 11 [oz_av] via ORAL
  Filled 2020-12-12: qty 330

## 2020-12-12 NOTE — Progress Notes (Signed)
Entered manually

## 2020-12-12 NOTE — Progress Notes (Signed)
Pt refused CPAP. Pt stated he hasn't worn one in over 10 years and does not want one at this time. RN aware.

## 2020-12-12 NOTE — Progress Notes (Signed)
PROGRESS NOTE    Fernando Vega  WUX:[id number] DOB: Jul 28, 1970 DOA: 12/11/2020 PCP: Langston Reusing, NP    Brief Narrative:  Fernando Vega is a 50 y.o. male seen in ed with complaints of worsening drainage erythema on right foot.  Patient was seen by wound care clinic on 12/08/2020, and patient has been receiving antibiotics via his PICC line the wound has gotten swollen and painful.. Pt had  MRI on the 12/07/2020, and was found to have Osteomyelitis. Pt sees Dr.Evans podiatry and is [facility]. Pt states he has cut down pasta and potato but his sugars are still staying high. Pt denies any other complaints and ros is negative. Pt coming to [place] from home lives with wife. NEVER SMOKED, although he has had secondhand exposure since childhood as both parents were smokers, no alcohol or drugs.  Father has a strong history of heart disease and died from a massive MI, mom is alive.   Patient was admitted for 11/01/2020 - 11/16/2020 hospitalization until amputation for necrotizing soft tissue infection of the right foot involving the right second MTP.  Patient was growing staph aureus on the wound culture, and was discharged after amputation IV antibiotics with a wound VAC, home health services were not able to be arranged due to insurance issues and wound VAC To be removed.  Patient did have a PICC line that was placed and patient was on Ancef 2 g every 8 hours until 12/12/2020 making it a total of 6 weeks.  Patient was seen by infectious disease on 12/07/2020.  Patient was also started on clindamycin by the wound care clinic on 12/01/2020.  Patient had a specialist hospital follow-up visit had no fever and was doing well. MSSA and GBS in the wound culture from surgery on 11/01/20- has been on cefazolin IV which he will complete tomorrow on 10/2.  10/2- pt NPO this am, awaiting podiatry input   Consultants:  podiatry  Procedures:  MRI from 9/27 1. Surgical changes from recent amputation of the second and  third toes and proximal second and third metatarsals. 2. Findings consistent with osteomyelitis involving the remaining second and third metatarsals and also the fourth metatarsal. 3. Associated severe myofasciitis, cellulitis and multiple small abscesses. 4. Patchy abnormal T1 and T2 signal intensity in the first metatarsal and first proximal phalanx may be reactive change and not likely osteomyelitis.     Antimicrobials:   Zosyn-d/c'd Started on meropenem and vanco   Subjective: Has no pain, c/o being [place] and wondering if he is going for procedure or something. Discussed with him about abx.  Objective: Vitals:   12/11/20 2200 12/11/20 2230 12/12/20 0121 12/12/20 0736  BP: (!) 186/102 (!) 165/110 136/80 138/80  Pulse: 96 95 88 89  Resp:   16 18  Temp:   98.2 F (36.8 C) 98.1 F (36.7 C)  TempSrc:      SpO2: 97% 97% 98% 96%  Weight:      Height:       No intake or output data in the 24 hours ending 12/12/20 0924 Filed Weights   12/11/20 1833  Weight: 84 kg    Examination:  General exam: Appears calm and comfortable  Respiratory system: Clear to auscultation. Respiratory effort normal. Cardiovascular system: S1 & S2 heard, RRR. No JVD, murmurs, rubs, gallops or clicks.  Gastrointestinal system: Abdomen is nondistended, soft and nontender. Normal bowel sounds heard. Central nervous system: Alert and oriented. No focal neurological deficits. Extremities: RLE wrapped  Psychiatry: Judgement and insight appear normal. Mood & affect appropriate.     Data Reviewed: I have personally reviewed following labs and imaging studies  CBC: Recent Labs  Lab 12/07/20 1305 12/11/20 1835 12/12/20 0550  WBC 6.1 8.4 7.2  NEUTROABS 2.8 5.3  --   HGB 9.9* 10.8* 10.0*  HCT 30.5* 34.0* 32.5*  MCV 89.4 87.2 89.0  PLT 274 392 026   Basic Metabolic Panel: Recent Labs  Lab 12/07/20 1305 12/11/20 1835 12/12/20 0550  NA 136 137 141  K 3.8 3.8 3.7  CL 100 100 102  CO2  27 26 30   GLUCOSE 177* 215* 158*  BUN 16 11 10   CREATININE 0.67 0.74 0.74  CALCIUM 8.4* 8.6* 8.7*   GFR: Estimated Creatinine Clearance: 109.8 mL/min (by C-G formula based on SCr of 0.74 mg/dL). Liver Function Tests: Recent Labs  Lab 12/07/20 1305 12/11/20 1835 12/12/20 0550  AST 17 17 14*  ALT 7 8 7   ALKPHOS 75 78 70  BILITOT 0.6 0.8 0.7  PROT 7.0 7.7 6.8  ALBUMIN 3.0* 3.4* 3.1*   No results for input(s): LIPASE, AMYLASE in the last 168 hours. No results for input(s): AMMONIA in the last 168 hours. Coagulation Profile: Recent Labs  Lab 12/12/20 0550  INR 1.1   Cardiac Enzymes: No results for input(s): CKTOTAL, CKMB, CKMBINDEX, TROPONINI in the last 168 hours. BNP (last 3 results) No results for input(s): PROBNP in the last 8760 hours. HbA1C: No results for input(s): HGBA1C in the last 72 hours. CBG: Recent Labs  Lab 12/11/20 2225 12/12/20 0833  GLUCAP 279* 157*   Lipid Profile: No results for input(s): CHOL, HDL, LDLCALC, TRIG, CHOLHDL, LDLDIRECT in the last 72 hours. Thyroid Function Tests: No results for input(s): TSH, T4TOTAL, FREET4, T3FREE, THYROIDAB in the last 72 hours. Anemia Panel: No results for input(s): VITAMINB12, FOLATE, FERRITIN, TIBC, IRON, RETICCTPCT in the last 72 hours. Sepsis Labs: Recent Labs  Lab 12/11/20 1835 12/12/20 0120  LATICACIDVEN 0.7 1.0    Recent Results (from the past 240 hour(s))  Aerobic Culture w Gram Stain (superficial specimen)     Status: None   Collection Time: 12/07/20 12:14 PM   Specimen: Wound  Result Value Ref Range Status   Specimen Description   Final    WOUND Performed at [facility], [address] [place], [place] [address]    Special Requests   Final    RIGHT FOOT WOUND Performed at [facility], [address] [place], [place] [address]    Gram Stain   Final    NO SQUAMOUS EPITHELIAL CELLS SEEN FEW WBC SEEN FEW GRAM POSITIVE COCCI Performed at [facility],  [place] [address] [place], [place] [address]    Culture   Final    MODERATE ACINETOBACTER CALCOACETICUS/BAUMANNII COMPLEX MODERATE ENTEROBACTER CLOACAE    Report Status 12/11/2020 FINAL  Final   Organism ID, Bacteria ACINETOBACTER CALCOACETICUS/BAUMANNII COMPLEX  Final   Organism ID, Bacteria ENTEROBACTER CLOACAE  Final      Susceptibility   Acinetobacter calcoaceticus/baumannii complex - MIC*    CEFTAZIDIME 8 SENSITIVE Sensitive     CIPROFLOXACIN <=0.25 SENSITIVE Sensitive     GENTAMICIN <=1 SENSITIVE Sensitive     IMIPENEM <=0.25 SENSITIVE Sensitive     PIP/TAZO >=128 RESISTANT Resistant     TRIMETH/SULFA <=20 SENSITIVE Sensitive     AMPICILLIN/SULBACTAM 4 SENSITIVE Sensitive     * MODERATE ACINETOBACTER CALCOACETICUS/BAUMANNII COMPLEX   Enterobacter cloacae - MIC*    CEFAZOLIN >=  64 RESISTANT Resistant     CEFEPIME <=0.12 SENSITIVE Sensitive     CEFTAZIDIME <=1 SENSITIVE Sensitive     CIPROFLOXACIN <=0.25 SENSITIVE Sensitive     GENTAMICIN <=1 SENSITIVE Sensitive     IMIPENEM 0.5 SENSITIVE Sensitive     TRIMETH/SULFA <=20 SENSITIVE Sensitive     PIP/TAZO <=4 SENSITIVE Sensitive     * MODERATE ENTEROBACTER CLOACAE  Resp Panel by RT-PCR (Flu A&B, Covid) Nasopharyngeal Swab     Status: Abnormal   Collection Time: 12/11/20  9:25 PM   Specimen: Nasopharyngeal Swab; Nasopharyngeal(NP) swabs in vial transport medium  Result Value Ref Range Status   SARS Coronavirus 2 by RT PCR POSITIVE (A) NEGATIVE Final    Comment: RESULT CALLED TO, READ BACK BY AND VERIFIED WITH: JULIA SHEEHAN @ 2236 12/11/20 LFD (NOTE) SARS-CoV-2 target nucleic acids are DETECTED.  The SARS-CoV-2 RNA is generally detectable in upper respiratory specimens during the acute phase of infection. Positive results are indicative of the presence of the identified virus, but do not rule out bacterial infection or co-infection with other pathogens not detected by the test. Clinical correlation with patient history and other  diagnostic information is necessary to determine patient infection status. The expected result is Negative.  Fact Sheet for Patients: EntrepreneurPulse.com.au  Fact Sheet for Healthcare Providers: IncredibleEmployment.be  This test is not yet approved or cleared by the [place] FDA and  has been authorized for detection and/or diagnosis of SARS-CoV-2 by FDA under an Emergency Use Authorization (EUA).  This EUA will remain in effect (meaning this test can be  used) for the duration of  the COVID-19 declaration under Section 564(b)(1) of the Act, 21 U.S.C. section 360bbb-3(b)(1), unless the authorization is terminated or revoked sooner.     Influenza A by PCR NEGATIVE NEGATIVE Final   Influenza B by PCR NEGATIVE NEGATIVE Final    Comment: (NOTE) The Xpert Xpress SARS-CoV-2/FLU/RSV plus assay is intended as an aid in the diagnosis of influenza from Nasopharyngeal swab specimens and should not be used as a sole basis for treatment. Nasal washings and aspirates are unacceptable for Xpert Xpress SARS-CoV-2/FLU/RSV testing.  Fact Sheet for Patients: EntrepreneurPulse.com.au  Fact Sheet for Healthcare Providers: IncredibleEmployment.be  This test is not yet approved or cleared by the [place] FDA and has been authorized for detection and/or diagnosis of SARS-CoV-2 by FDA under an Emergency Use Authorization (EUA). This EUA will remain in effect (meaning this test can be used) for the duration of the COVID-19 declaration under Section 564(b)(1) of the Act, 21 U.S.C. section 360bbb-3(b)(1), unless the authorization is terminated or revoked.  Performed at [facility], [address], [place], [place] [address]          Radiology Studies: No results found.      Scheduled Meds:  gabapentin  100 mg Oral QHS   heparin  5,000 Units Subcutaneous Q8H   insulin aspart  0-5 Units  Subcutaneous QHS   insulin aspart  0-9 Units Subcutaneous TID WC   pantoprazole (PROTONIX) IV  40 mg Intravenous Q24H   Continuous Infusions:  sodium chloride 50 mL/hr at 12/12/20 0109   piperacillin-tazobactam (ZOSYN)  IV 3.375 g (12/12/20 0626)   vancomycin 1,250 mg (12/12/20 0835)    Assessment & Plan:   Principal Problem:   Diabetic foot infection ([place]) Active Problems:   Type 2 diabetes mellitus with peripheral neuropathy ([place])   Sleep apnea   Anemia   Osteomyelitis due to type 2 diabetes mellitus ([place])  Elevated blood pressure reading   Diabetic foot infection/osteomyelitis: Started on meropenem and vancomycin  CRP elevated  Podiatry consulted pending  Will need ID consult in a.m.  Wound care consult  ABI ordered    DM II; Diabetic educator consulted Continue R-ISS Currently n.p.o. at   Hypertension Pain may also be causing BP to elevate Pain control IV hydralazine as needed    Anemia: H&H stable Stool guaiac was ordered pending No symptoms of hematochezia, melena or hemoptysis.  Patient denies NSAIDs Change IV PPI to p.o.    OSA: CPAP per home setting       DVT prophylaxis: Heparin Code Status: Full Family Communication: None at bedside Disposition Plan:  Status is: Inpatient  Remains inpatient appropriate because:IV treatments appropriate due to intensity of illness or inability to take PO  Dispo: The patient is from: Home              Anticipated d/c is to: Home              Patient currently is not medically stable to d/c.   Difficult to place patient No            LOS: 1 day   Time spent: 35 minutes with more than 50% on [place], MD [facility] Pager [phone]  If 7PM-7AM, please contact night-coverage 12/12/2020, 9:24 AM

## 2020-12-12 NOTE — Progress Notes (Signed)
Initial Nutrition Assessment  DOCUMENTATION CODES:   Not applicable  INTERVENTION:   -Glucerna Shake po BID, each supplement provides 220 kcal and 10 grams of protein  -Ensure Max po daily, each supplement provides 150 kcal and 30 grams of protein -MVI with minerals daily  NUTRITION DIAGNOSIS:   Increased nutrient needs related to wound healing as evidenced by estimated needs.  GOAL:   Patient will meet greater than or equal to 90% of their needs  MONITOR:   PO intake, Supplement acceptance, Labs, Weight trends, Skin, I & O's  REASON FOR ASSESSMENT:   Consult Wound healing  ASSESSMENT:   Fernando Vega is a 50 y.o. male seen in ed with complaints of worsening drainage erythema on right foot.  Patient was seen by wound care clinic on 12/08/2020, and patient has been receiving antibiotics via his PICC line the wound has gotten swollen and painful..  Pt had  MRI on the 12/07/2020, and was found to have Osteomyelitis. Pt sees Dr.Evans podiatry and is [facility]. Pt states he has cut down pasta and potato but his sugars are still staying high  Pt admitted with DM foot infection and osteomyelitis.   Pt unavailable at time of visit. Attempted to speak with pt via call to hospital room phone, however, unable to reach. RD unable to obtain further nutrition-related history or complete nutrition-focused physical exam at this time.     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   Pt currently on a carb modified diet, however, no meal completion data available to assess at this time.   Reviewed wt hx; pt has experienced a 14.2% wt loss over the past month, which is significant for time frame. Suspect uncontrolled DM may be contributing to weight loss.   Medications reviewed and include 0.9% sodium chloride infusion @ 50 ml/hr.  Lab Results  Component Value Date   HGBA1C 11.8 (H) 11/01/2020   PTA DM medications are 10 units insulin glargine daily and  5 units insulin lispro TID.   Labs reviewed: CBGS: [phone] (inpatient orders for glycemic control are 0-5 units insulin aspart daily at bedtime and 0-9 units insulin aspart TID with meals). DM coordinator has been pt and sent recommendation to MD via secure chat.  Diet Order:   Diet Order             Diet Carb Modified Fluid consistency: Thin; Room service appropriate? Yes  Diet effective now                   EDUCATION NEEDS:   No education needs have been identified at this time  Skin:  Skin Assessment: Reviewed RN Assessment  Last BM:  12/12/20  Height:   Ht Readings from Last 1 Encounters:  12/11/20 5\' 6"  (1.676 m)    Weight:   Wt Readings from Last 1 Encounters:  12/11/20 84 kg    Ideal Body Weight:  64.5 kg  BMI:  Body mass index is 29.89 kg/m.  Estimated Nutritional Needs:   Kcal:  1950-2150  Protein:  100-115 grams  Fluid:  > 1.9 L    Loistine Chance, RD, LDN, [place] Registered Dietitian II Certified Diabetes Care and Education Specialist Please refer to [facility] for RD and/or RD on-call/weekend/after hours pager

## 2020-12-12 NOTE — Progress Notes (Signed)
Inpatient Diabetes Program Recommendations  AACE/ADA: New Consensus Statement on Inpatient Glycemic Control (2015)  Target Ranges:  Prepandial:   less than 140 mg/dL      Peak postprandial:   less than 180 mg/dL (1-2 hours)      Critically ill patients:  140 - 180 mg/dL   Lab Results  Component Value Date   GLUCAP 279 (H) 12/11/2020   HGBA1C 11.8 (H) 11/01/2020    Review of Glycemic Control  Diabetes history: DM2 Outpatient Diabetes medications: Basaglar 10 units qd, Humalog 5 units tid Current orders for Inpatient glycemic control: Novolog correction scale 0-9 units tid + hs 0-5 units  Inpatient Diabetes Program Recommendations:   Please consider: -Add Semglee 5 units qd (50% home basal insulin dose) -Novolog 4 units tid meal coverage if eats 50% Secure chat sent to Dr. Kurtis Bushman  Thank you, Nani Gasser. Juno Bozard, RN, MSN, CDE  Diabetes Coordinator Inpatient Glycemic Control Team Team Pager [phone] (8am-5pm) 12/12/2020 8:21 AM

## 2020-12-12 NOTE — Consult Note (Signed)
Reason for Consult: Diabetic foot infection osteomyelitis of the right foot Referring Physician: Dr. Marni Griffon is an 50 y.o. male.  HPI: 50 year old man known to our practice from previous puncture wound and severe diabetic foot infection necessitating second and third partial ray amputations.  He has been on 4 weeks of IV antibiotics and is followed by Dr. Delaine Lame from ID.  The foot has continued to have poor healing he started going to the wound care center at [facility], he had an MRI recently that showed residual osteomyelitis and spread of osteomyelitis to the fourth metatarsal.  His foot started hurting more and so he came to the ER.  Incidentally found to be COVID-19 positive he is asymptomatic  Past Medical History:  Diagnosis Date   Diabetes mellitus without complication ([place])    Neuropathy    Sarcoidosis    Sleep apnea     Past Surgical History:  Procedure Laterality Date   AMPUTATION Right 11/05/2020   Procedure: AMPUTATION RAY-Partial Ray Amp 2nd & 3rd Digit;  Surgeon: Edrick Kins, DPM;  Location: [facility] ORS;  Service: Podiatry;  Laterality: Right;   APPLICATION OF WOUND VAC Right 11/09/2020   Procedure: APPLICATION OF WOUND VAC;  Surgeon: Edrick Kins, DPM;  Location: [facility] ORS;  Service: Podiatry;  Laterality: Right;   INCISION AND DRAINAGE Right 11/01/2020   Procedure: INCISION AND DRAINAGE-Right Foot;  Surgeon: Criselda Peaches, DPM;  Location: [facility] ORS;  Service: Podiatry;  Laterality: Right;   IRRIGATION AND DEBRIDEMENT FOOT Right 11/09/2020   Procedure: IRRIGATION AND DEBRIDEMENT FOOT;  Surgeon: Edrick Kins, DPM;  Location: [facility] ORS;  Service: Podiatry;  Laterality: Right;    Family History  Problem Relation Age of Onset   Hypertension Mother    Diabetes type II Father    Parkinson's disease Father    Heart disease Father     Social History:  reports that he has never smoked. He has never used smokeless tobacco. He reports that he does not drink  alcohol and does not use drugs.  Allergies: No Known Allergies  Medications: I have reviewed the patient's current medications.  Results for orders placed or performed during the hospital encounter of 12/11/20 (from the past 48 hour(s))  Lactic acid, plasma     Status: None   Collection Time: 12/11/20  6:35 PM  Result Value Ref Range   Lactic Acid, Venous 0.7 0.5 - 1.9 mmol/L    Comment: Performed at [facility], [address] [place], [place] [address]  Comprehensive metabolic panel     Status: Abnormal   Collection Time: 12/11/20  6:35 PM  Result Value Ref Range   Sodium 137 135 - 145 mmol/L   Potassium 3.8 3.5 - 5.1 mmol/L   Chloride 100 98 - 111 mmol/L   CO2 26 22 - 32 mmol/L   Glucose, Bld 215 (H) 70 - 99 mg/dL    Comment: Glucose reference range applies only to samples taken after fasting for at least 8 hours.   BUN 11 6 - 20 mg/dL   Creatinine, Ser 0.74 0.61 - 1.24 mg/dL   Calcium 8.6 (L) 8.9 - 10.3 mg/dL   Total Protein 7.7 6.5 - 8.1 g/dL   Albumin 3.4 (L) 3.5 - 5.0 g/dL   AST 17 15 - 41 U/L   ALT 8 0 - 44 U/L   Alkaline Phosphatase 78 38 - 126 U/L   Total Bilirubin 0.8 0.3 - 1.2 mg/dL  GFR, Estimated >60 >60 mL/min    Comment: (NOTE) Calculated using the CKD-EPI Creatinine Equation (2021)    Anion gap 11 5 - 15    Comment: Performed at [facility], [address] [place], [place] [address]  CBC with Differential     Status: Abnormal   Collection Time: 12/11/20  6:35 PM  Result Value Ref Range   WBC 8.4 4.0 - 10.5 K/uL   RBC 3.90 (L) 4.22 - 5.81 MIL/uL   Hemoglobin 10.8 (L) 13.0 - 17.0 g/dL   HCT 34.0 (L) 39.0 - 52.0 %   MCV 87.2 80.0 - 100.0 fL   MCH 27.7 26.0 - 34.0 pg   MCHC 31.8 30.0 - 36.0 g/dL   RDW 13.4 11.5 - 15.5 %   Platelets 392 150 - 400 K/uL   nRBC 0.0 0.0 - 0.2 %   Neutrophils Relative % 63 %   Neutro Abs 5.3 1.7 - 7.7 K/uL   Lymphocytes Relative 26 %   Lymphs Abs 2.2 0.7 - 4.0 K/uL   Monocytes Relative 9 %    Monocytes Absolute 0.8 0.1 - 1.0 K/uL   Eosinophils Relative 1 %   Eosinophils Absolute 0.1 0.0 - 0.5 K/uL   Basophils Relative 0 %   Basophils Absolute 0.0 0.0 - 0.1 K/uL   Immature Granulocytes 1 %   Abs Immature Granulocytes 0.05 0.00 - 0.07 K/uL    Comment: Performed at [facility], [address] [place], [place] [address]  Resp Panel by RT-PCR (Flu A&B, Covid) Nasopharyngeal Swab     Status: Abnormal   Collection Time: 12/11/20  9:25 PM   Specimen: Nasopharyngeal Swab; Nasopharyngeal(NP) swabs in vial transport medium  Result Value Ref Range   SARS Coronavirus 2 by RT PCR POSITIVE (A) NEGATIVE    Comment: RESULT CALLED TO, READ BACK BY AND VERIFIED WITH: JULIA SHEEHAN @ 2236 12/11/20 LFD (NOTE) SARS-CoV-2 target nucleic acids are DETECTED.  The SARS-CoV-2 RNA is generally detectable in upper respiratory specimens during the acute phase of infection. Positive results are indicative of the presence of the identified virus, but do not rule out bacterial infection or co-infection with other pathogens not detected by the test. Clinical correlation with patient history and other diagnostic information is necessary to determine patient infection status. The expected result is Negative.  Fact Sheet for Patients: EntrepreneurPulse.com.au  Fact Sheet for Healthcare Providers: IncredibleEmployment.be  This test is not yet approved or cleared by the [place] FDA and  has been authorized for detection and/or diagnosis of SARS-CoV-2 by FDA under an Emergency Use Authorization (EUA).  This EUA will remain in effect (meaning this test can be  used) for the duration of  the COVID-19 declaration under Section 564(b)(1) of the Act, 21 U.S.C. section 360bbb-3(b)(1), unless the authorization is terminated or revoked sooner.     Influenza A by PCR NEGATIVE NEGATIVE   Influenza B by PCR NEGATIVE NEGATIVE    Comment: (NOTE) The Xpert Xpress  SARS-CoV-2/FLU/RSV plus assay is intended as an aid in the diagnosis of influenza from Nasopharyngeal swab specimens and should not be used as a sole basis for treatment. Nasal washings and aspirates are unacceptable for Xpert Xpress SARS-CoV-2/FLU/RSV testing.  Fact Sheet for Patients: EntrepreneurPulse.com.au  Fact Sheet for Healthcare Providers: IncredibleEmployment.be  This test is not yet approved or cleared by the [place] FDA and has been authorized for detection and/or diagnosis of SARS-CoV-2 by FDA under an Emergency Use Authorization (EUA). This EUA will  remain in effect (meaning this test can be used) for the duration of the COVID-19 declaration under Section 564(b)(1) of the Act, 21 U.S.C. section 360bbb-3(b)(1), unless the authorization is terminated or revoked.  Performed at [facility], [address] [place], [place] [address]   CBG monitoring, ED     Status: Abnormal   Collection Time: 12/11/20 10:25 PM  Result Value Ref Range   Glucose-Capillary 279 (H) 70 - 99 mg/dL    Comment: Glucose reference range applies only to samples taken after fasting for at least 8 hours.  Lactic acid, plasma     Status: None   Collection Time: 12/12/20  1:20 AM  Result Value Ref Range   Lactic Acid, Venous 1.0 0.5 - 1.9 mmol/L    Comment: Performed at [facility], [address] [place], [place] [address]  C-reactive protein     Status: Abnormal   Collection Time: 12/12/20  1:20 AM  Result Value Ref Range   CRP 5.2 (H) <1.0 mg/dL    Comment: Performed at [facility] [address], [place], [place] [address]  Comprehensive metabolic panel     Status: Abnormal   Collection Time: 12/12/20  5:50 AM  Result Value Ref Range   Sodium 141 135 - 145 mmol/L   Potassium 3.7 3.5 - 5.1 mmol/L   Chloride 102 98 - 111 mmol/L   CO2 30 22 - 32 mmol/L   Glucose, Bld 158 (H) 70 - 99 mg/dL    Comment: Glucose reference  range applies only to samples taken after fasting for at least 8 hours.   BUN 10 6 - 20 mg/dL   Creatinine, Ser 0.74 0.61 - 1.24 mg/dL   Calcium 8.7 (L) 8.9 - 10.3 mg/dL   Total Protein 6.8 6.5 - 8.1 g/dL   Albumin 3.1 (L) 3.5 - 5.0 g/dL   AST 14 (L) 15 - 41 U/L   ALT 7 0 - 44 U/L   Alkaline Phosphatase 70 38 - 126 U/L   Total Bilirubin 0.7 0.3 - 1.2 mg/dL   GFR, Estimated >60 >60 mL/min    Comment: (NOTE) Calculated using the CKD-EPI Creatinine Equation (2021)    Anion gap 9 5 - 15    Comment: Performed at [facility], [address] [place], [place] [address]  CBC     Status: Abnormal   Collection Time: 12/12/20  5:50 AM  Result Value Ref Range   WBC 7.2 4.0 - 10.5 K/uL   RBC 3.65 (L) 4.22 - 5.81 MIL/uL   Hemoglobin 10.0 (L) 13.0 - 17.0 g/dL   HCT 32.5 (L) 39.0 - 52.0 %   MCV 89.0 80.0 - 100.0 fL   MCH 27.4 26.0 - 34.0 pg   MCHC 30.8 30.0 - 36.0 g/dL   RDW 13.2 11.5 - 15.5 %   Platelets 376 150 - 400 K/uL   nRBC 0.0 0.0 - 0.2 %    Comment: Performed at [facility], [address] [place], [place] [address]  Protime-INR     Status: None   Collection Time: 12/12/20  5:50 AM  Result Value Ref Range   Prothrombin Time 14.2 11.4 - 15.2 seconds   INR 1.1 0.8 - 1.2    Comment: (NOTE) INR goal varies based on device and disease states. Performed at [facility], [address] [place], [place] [address]   APTT     Status: None   Collection Time: 12/12/20  5:50 AM  Result Value Ref Range   aPTT 35 24 - 36 seconds    Comment: Performed at [facility], [address] [place], [place] [address]  Glucose, capillary     Status: Abnormal   Collection Time: 12/12/20  8:33 AM  Result Value Ref Range   Glucose-Capillary 157 (H) 70 - 99 mg/dL    Comment: Glucose reference range applies only to samples taken after fasting for at least 8 hours.  Glucose, capillary     Status: Abnormal   Collection Time: 12/12/20 12:09 PM  Result Value  Ref Range   Glucose-Capillary 134 (H) 70 - 99 mg/dL    Comment: Glucose reference range applies only to samples taken after fasting for at least 8 hours.    No results found.  Review of Systems  Musculoskeletal:  Positive for joint pain (Right foot).  Skin:        Ulceration right foot  All other systems reviewed and are negative. Blood pressure (!) 160/82, pulse 88, temperature 98.2 F (36.8 C), resp. rate 16, height 5\' 6"  (1.676 m), weight 84 kg, SpO2 98 %.  Vitals:   12/12/20 0736 12/12/20 1205  BP: 138/80 (!) 160/82  Pulse: 89 88  Resp: 18 16  Temp: 98.1 F (36.7 C) 98.2 F (36.8 C)  SpO2: 96% 98%    General AA&O x3. Normal mood and affect.  Vascular Palpable pulses his foot is warm and well-perfused  Neurologic Epicritic sensation grossly absent.  With peripheral neuropathy  Dermatologic (Wound) Severe open ulceration with necrotic fibrotic tissue and cellulitis of the right foot  Orthopedic: Motor intact BLE.    Assessment/Plan:  Right foot osteomyelitis, diabetic foot infection -Imaging: Studies independently reviewed -Antibiotics: Has been worsening despite his current IV antibiotics.  Would likely broaden to cover broad-spectrum.  Recommended ID consult, has been followed by Dr. Delaine Lame from his last infection -WB Status: After surgery will be strict NWB on the right side -Wound Care: Packed open with iodoform -Surgical Plan: Discussed with him his prospects of limb salvage and further limb loss.  We discussed elective BKA currently or trying to salvage with a partial foot amputation.  I do not think his wound will heal without further bone resection.  Admitted when he is facing a transmit or Lisfranc amputation.  He wishes to try to save the foot.  I discussed with him that this may not be successful but is probably his best chance that avoiding a prosthetic for now.  If this fails he will need a BKA. -Plan for surgery tomorrow after 5 PM for open midfoot  amputation.  He will need to return to the OR for final closure at a later date -N.p.o. past midnight and I have ordered this as well as an informed consent order  Criselda Peaches 12/12/2020, 2:50 PM   Best available via secure chat for questions or concerns.

## 2020-12-12 NOTE — Plan of Care (Signed)

## 2020-12-13 ENCOUNTER — Encounter: Payer: Self-pay | Admitting: Internal Medicine

## 2020-12-13 ENCOUNTER — Inpatient Hospital Stay: Payer: Medicaid Other | Admitting: Anesthesiology

## 2020-12-13 ENCOUNTER — Encounter
Admission: EM | Disposition: A | Discharge status: Home Health | Payer: Self-pay | Source: Home / Self Care | Attending: Internal Medicine

## 2020-12-13 DIAGNOSIS — E11628 Type 2 diabetes mellitus with other skin complications: Secondary | ICD-10-CM

## 2020-12-13 DIAGNOSIS — E1169 Type 2 diabetes mellitus with other specified complication: Secondary | ICD-10-CM

## 2020-12-13 DIAGNOSIS — L089 Local infection of the skin and subcutaneous tissue, unspecified: Secondary | ICD-10-CM

## 2020-12-13 DIAGNOSIS — M869 Osteomyelitis, unspecified: Secondary | ICD-10-CM

## 2020-12-13 HISTORY — PX: AMPUTATION: SHX166

## 2020-12-13 HISTORY — PX: IRRIGATION AND DEBRIDEMENT FOOT: SHX6602

## 2020-12-13 LAB — GLUCOSE, CAPILLARY
Glucose-Capillary: 205 mg/dL — ABNORMAL HIGH (ref 70–99)
Glucose-Capillary: 181 mg/dL — ABNORMAL HIGH (ref 70–99)
Glucose-Capillary: 121 mg/dL — ABNORMAL HIGH (ref 70–99)
Glucose-Capillary: 346 mg/dL — ABNORMAL HIGH (ref 70–99)

## 2020-12-13 LAB — HEMOGLOBIN A1C
Hgb A1c MFr Bld: 8 % — ABNORMAL HIGH (ref 4.8–5.6)
Mean Plasma Glucose: 183 mg/dL

## 2020-12-13 SURGERY — IRRIGATION AND DEBRIDEMENT FOOT
Anesthesia: General | Site: Foot | Laterality: Right

## 2020-12-13 MED ORDER — MIDAZOLAM HCL 2 MG/2ML IJ SOLN
INTRAMUSCULAR | Status: AC
  Filled 2020-12-13: qty 2

## 2020-12-13 MED ORDER — MIDAZOLAM HCL 2 MG/2ML IJ SOLN
INTRAMUSCULAR | Status: DC | PRN
  Administered 2020-12-13: 2 mg via INTRAVENOUS

## 2020-12-13 MED ORDER — ACETAMINOPHEN 325 MG PO TABS
650.0000 mg | ORAL_TABLET | Freq: Four times a day (QID) | ORAL | Status: DC | PRN
  Administered 2020-12-15 – 2020-12-18 (×2): 650 mg via ORAL
  Filled 2020-12-13 (×2): qty 2

## 2020-12-13 MED ORDER — ACETAMINOPHEN 325 MG RE SUPP
650.0000 mg | Freq: Four times a day (QID) | RECTAL | Status: DC | PRN
  Filled 2020-12-13: qty 2

## 2020-12-13 MED ORDER — MORPHINE SULFATE (PF) 2 MG/ML IV SOLN
2.0000 mg | INTRAVENOUS | Status: DC | PRN
Start: 2020-12-13 — End: 2020-12-17
  Administered 2020-12-14 – 2020-12-16 (×2): 2 mg via INTRAVENOUS
  Filled 2020-12-13 (×2): qty 1

## 2020-12-13 MED ORDER — FENTANYL CITRATE (PF) 100 MCG/2ML IJ SOLN
INTRAMUSCULAR | Status: AC
  Filled 2020-12-13: qty 2

## 2020-12-13 MED ORDER — LACTATED RINGERS IV SOLN: INTRAVENOUS | Status: DC | PRN

## 2020-12-13 MED ORDER — HYDROCODONE-ACETAMINOPHEN 5-325 MG PO TABS
1.0000 | ORAL_TABLET | ORAL | Status: DC | PRN
  Administered 2020-12-14 – 2020-12-17 (×9): 2 via ORAL
  Filled 2020-12-13 (×13): qty 2

## 2020-12-13 MED ORDER — PROPOFOL 500 MG/50ML IV EMUL
INTRAVENOUS | Status: DC | PRN
  Administered 2020-12-13: 75 ug/kg/min via INTRAVENOUS

## 2020-12-13 MED ORDER — 0.9 % SODIUM CHLORIDE (POUR BTL) OPTIME
TOPICAL | Status: DC | PRN
  Administered 2020-12-13: 10 mL

## 2020-12-13 MED ORDER — PROPOFOL 10 MG/ML IV BOLUS
INTRAVENOUS | Status: DC | PRN
  Administered 2020-12-13: 20 mg via INTRAVENOUS

## 2020-12-13 MED ORDER — BUPIVACAINE HCL (PF) 0.5 % IJ SOLN
INTRAMUSCULAR | Status: AC
  Filled 2020-12-13: qty 30

## 2020-12-13 MED ORDER — BUPIVACAINE HCL 0.5 % IJ SOLN
INTRAMUSCULAR | Status: DC | PRN
  Administered 2020-12-13: 20 mL

## 2020-12-13 MED ORDER — VANCOMYCIN HCL 1000 MG IV SOLR
INTRAVENOUS | Status: AC
  Filled 2020-12-13: qty 20

## 2020-12-13 MED ORDER — FENTANYL CITRATE (PF) 100 MCG/2ML IJ SOLN
INTRAMUSCULAR | Status: DC | PRN
  Administered 2020-12-13: 100 ug via INTRAVENOUS

## 2020-12-13 MED ORDER — INSULIN GLARGINE-YFGN 100 UNIT/ML ~~LOC~~ SOLN
5.0000 [IU] | Freq: Every day | SUBCUTANEOUS | Status: DC
  Filled 2020-12-13: qty 0.05

## 2020-12-13 MED ORDER — SODIUM CHLORIDE 0.9 % IR SOLN
Status: DC | PRN
  Administered 2020-12-13: 3000 mL

## 2020-12-13 SURGICAL SUPPLY — 64 items
BLADE OSC/SAGITTAL MD 5.5X18 (BLADE) IMPLANT
BLADE OSCILLATING/SAGITTAL (BLADE) ×1
BLADE SURG 15 STRL LF DISP TIS (BLADE) ×2 IMPLANT
BLADE SURG 15 STRL SS (BLADE) ×1
BLADE SW THK.38XMED LNG THN (BLADE) ×2 IMPLANT
BNDG COHESIVE 4X5 TAN ST LF (GAUZE/BANDAGES/DRESSINGS) ×3 IMPLANT
BNDG COHESIVE 6X5 TAN ST LF (GAUZE/BANDAGES/DRESSINGS) ×3 IMPLANT
BNDG CONFORM 2 STRL LF (GAUZE/BANDAGES/DRESSINGS) ×3 IMPLANT
BNDG CONFORM 3 STRL LF (GAUZE/BANDAGES/DRESSINGS) ×3 IMPLANT
BNDG ELASTIC 4X5.8 VLCR STR LF (GAUZE/BANDAGES/DRESSINGS) ×3 IMPLANT
BNDG ESMARK 4X12 TAN STRL LF (GAUZE/BANDAGES/DRESSINGS) ×3 IMPLANT
BNDG GAUZE ELAST 4 BULKY (GAUZE/BANDAGES/DRESSINGS) ×3 IMPLANT
CANISTER WOUND CARE 500ML ATS (WOUND CARE) ×3 IMPLANT
CNTNR SPEC 2.5X3XGRAD LEK (MISCELLANEOUS) ×2
CONT SPEC 4OZ STER OR WHT (MISCELLANEOUS) ×1
CONTAINER SPEC 2.5X3XGRAD LEK (MISCELLANEOUS) ×2 IMPLANT
CUFF TOURN SGL QUICK 12 (TOURNIQUET CUFF) IMPLANT
CUFF TOURN SGL QUICK 18X4 (TOURNIQUET CUFF) IMPLANT
DRAPE FLUOR MINI C-ARM 54X84 (DRAPES) IMPLANT
DRAPE XRAY CASSETTE 23X24 (DRAPES) IMPLANT
DURAPREP 26ML APPLICATOR (WOUND CARE) ×3 IMPLANT
ELECT REM PT RETURN 9FT ADLT (ELECTROSURGICAL) ×3
ELECTRODE REM PT RTRN 9FT ADLT (ELECTROSURGICAL) ×2 IMPLANT
GAUZE 4X4 16PLY ~~LOC~~+RFID DBL (SPONGE) ×3 IMPLANT
GAUZE PACKING 1/4 X5 YD (GAUZE/BANDAGES/DRESSINGS) ×3 IMPLANT
GAUZE PACKING IODOFORM 1X5 (PACKING) ×3 IMPLANT
GAUZE SPONGE 4X4 12PLY STRL (GAUZE/BANDAGES/DRESSINGS) ×3 IMPLANT
GAUZE XEROFORM 1X8 LF (GAUZE/BANDAGES/DRESSINGS) ×3 IMPLANT
GLOVE SURG ENC MOIS LTX SZ7 (GLOVE) ×6 IMPLANT
GOWN STRL REUS W/ TWL XL LVL3 (GOWN DISPOSABLE) ×4 IMPLANT
GOWN STRL REUS W/TWL MED LVL3 (GOWN DISPOSABLE) IMPLANT
GOWN STRL REUS W/TWL XL LVL3 (GOWN DISPOSABLE) ×2
HANDPIECE VERSAJET DEBRIDEMENT (MISCELLANEOUS) IMPLANT
IV NS 1000ML (IV SOLUTION) ×1
IV NS 1000ML BAXH (IV SOLUTION) ×2 IMPLANT
KIT DRSG VAC SLVR GRANUFM (MISCELLANEOUS) ×3 IMPLANT
KIT TURNOVER KIT A (KITS) ×3 IMPLANT
LABEL OR SOLS (LABEL) ×3 IMPLANT
MANIFOLD NEPTUNE II (INSTRUMENTS) ×3 IMPLANT
NEEDLE FILTER BLUNT 18X 1/2SAF (NEEDLE) ×1
NEEDLE FILTER BLUNT 18X1 1/2 (NEEDLE) ×2 IMPLANT
NEEDLE HYPO 25X1 1.5 SAFETY (NEEDLE) ×3 IMPLANT
NS IRRIG 500ML POUR BTL (IV SOLUTION) ×3 IMPLANT
PACK EXTREMITY ARMC (MISCELLANEOUS) ×3 IMPLANT
PAD ABD DERMACEA PRESS 5X9 (GAUZE/BANDAGES/DRESSINGS) ×6 IMPLANT
PENCIL ELECTRO HAND CTR (MISCELLANEOUS) ×3 IMPLANT
PULSAVAC PLUS IRRIG FAN TIP (DISPOSABLE) ×3
RASP SM TEAR CROSS CUT (RASP) IMPLANT
SHIELD FULL FACE ANTIFOG 7M (MISCELLANEOUS) ×3 IMPLANT
STOCKINETTE IMPERVIOUS 9X36 MD (GAUZE/BANDAGES/DRESSINGS) ×3 IMPLANT
SUT ETHILON 2 0 FS 18 (SUTURE) IMPLANT
SUT ETHILON 4-0 (SUTURE)
SUT ETHILON 4-0 FS2 18XMFL BLK (SUTURE)
SUT VIC AB 3-0 SH 27 (SUTURE) ×1
SUT VIC AB 3-0 SH 27X BRD (SUTURE) ×2 IMPLANT
SUT VIC AB 4-0 FS2 27 (SUTURE) IMPLANT
SUT VICRYL 3-0 27IN (SUTURE) ×3 IMPLANT
SUTURE ETHLN 4-0 FS2 18XMF BLK (SUTURE) IMPLANT
SWAB CULTURE AMIES ANAERIB BLU (MISCELLANEOUS) IMPLANT
SYR 10ML LL (SYRINGE) ×3 IMPLANT
SYR 3ML LL SCALE MARK (SYRINGE) ×3 IMPLANT
TIP FAN IRRIG PULSAVAC PLUS (DISPOSABLE) ×2 IMPLANT
UNDERPAD 30X36 HEAVY ABSORB (UNDERPADS AND DIAPERS) ×3 IMPLANT
WATER STERILE IRR 500ML POUR (IV SOLUTION) ×3 IMPLANT

## 2020-12-13 NOTE — Brief Op Note (Signed)
12/13/2020  7:41 PM  PATIENT:  Fernando Vega  50 y.o. male  PRE-OPERATIVE DIAGNOSIS:  osteomyelitis, diabetic foot infection  POST-OPERATIVE DIAGNOSIS:  osteomyelitis, diabetic foot infection  PROCEDURE:  Procedure(s): IRRIGATION AND DEBRIDEMENT FOOT (Right) AMPUTATION midFOOT-RIGHT (Right)  SURGEON:  Surgeon(s) and Role:    * Arkel Cartwright, Stephan Minister, DPM - Primary  ASSISTANTS: none   ANESTHESIA:   none  EBL:  30cc    BLOOD ADMINISTERED:none  DRAINS: none   LOCAL MEDICATIONS USED:  MARCAINE    and Amount: 20 ml  SPECIMEN:  toes and metatarsals  DISPOSITION OF SPECIMEN:  path and tissue culture  COUNTS:  YES  TOURNIQUET:   Total Tourniquet Time Documented: Ankle (Right) - 34 minutes Total: Ankle (Right) - 34 minutes   DICTATION: .Note written in EPIC  PLAN OF CARE: Admit to inpatient   PATIENT DISPOSITION:  PACU - hemodynamically stable.   Delay start of Pharmacological VTE agent (>24hrs) due to surgical blood loss or risk of bleeding: no

## 2020-12-13 NOTE — Progress Notes (Signed)
History and Physical Interval Note:  12/13/2020 6:08 PM  Fernando Vega  has presented today for surgery, with the diagnosis of right foot infection.  The various methods of treatment have been discussed with the patient and family. After consideration of risks, benefits and other options for treatment, the patient has consented to   Procedure(s): IRRIGATION AND DEBRIDEMENT FOOT (Right) as a surgical intervention.  The patient's history has been reviewed, patient examined, no change in status, stable for surgery.  I have reviewed the patient's chart and labs.  Questions were answered to the patient's satisfaction.     Criselda Peaches

## 2020-12-13 NOTE — Progress Notes (Addendum)
Inpatient Diabetes Program Recommendations  AACE/ADA: New Consensus Statement on Inpatient Glycemic Control (2015)  Target Ranges:  Prepandial:   less than 140 mg/dL      Peak postprandial:   less than 180 mg/dL (1-2 hours)      Critically ill patients:  140 - 180 mg/dL  Results for JAKOBI, THETFORD" (MRN [id number]) as of 12/13/2020 08:54  Ref. Range 12/12/2020 08:33 12/12/2020 12:09 12/12/2020 16:30 12/12/2020 22:06  Glucose-Capillary Latest Ref Range: 70 - 99 mg/dL 157 (H)  NOVOLOG Held 134 (H)  NOVOLOG Held 203 (H)  3 units Novolog  220 (H)  2 units Novolog   Results for DARON, STUTZ" (MRN [id number]) as of 12/13/2020 08:54  Ref. Range 12/13/2020 08:13  Glucose-Capillary Latest Ref Range: 70 - 99 mg/dL 205 (H)  3 units Novolog    Home DM Meds: Basaglar 10 units daily     Humalog 5 units tid     Humalog SSI  Current Orders: Novolog Sensitive Correction Scale/ SSI (0-9 units) TID AC + HS    MD- Note CBG 205 this AM.  Please consider:  1. Start Semglee 5 units Daily (50% home dose to start)  2. Start Novolog Meal Coverage: Novolog 3 units TID with meals on AM of 10/04 after PO diet resumed (note NPO for surgery today) (Portion of home dose to start) Hold if pt eats <50% of meal, Hold if pt NPO    --Will follow patient during hospitalization--  Wyn Quaker RN, MSN, CDE Diabetes Coordinator Inpatient Glycemic Control Team Team Pager: [phone] (8a-5p)

## 2020-12-13 NOTE — Anesthesia Procedure Notes (Signed)
Procedure Name: General with mask airway Date/Time: 12/13/2020 6:46 PM Performed by: Kelton Pillar, CRNA Pre-anesthesia Checklist: Patient identified, Emergency Drugs available, Suction available and Patient being monitored Patient Re-evaluated:Patient Re-evaluated prior to induction Oxygen Delivery Method: Simple face mask Induction Type: IV induction Placement Confirmation: positive ETCO2 and CO2 detector Dental Injury: Teeth and Oropharynx as per pre-operative assessment

## 2020-12-13 NOTE — Anesthesia Preprocedure Evaluation (Signed)
Anesthesia Evaluation  Patient identified by MRN, date of birth, ID band Patient awake    Reviewed: Allergy & Precautions, NPO status , Patient's Chart, lab work & pertinent test results  History of Anesthesia Complications Negative for: history of anesthetic complications  Airway Mallampati: II  TM Distance: >3 FB Neck ROM: Full    Dental  (+) Edentulous Upper, Edentulous Lower, Dental Advidsory Given   Pulmonary neg shortness of breath, sleep apnea (noncompliant with CPAP) , neg COPD, Recent URI  (Covid +),    breath sounds clear to auscultation- rhonchi (-) wheezing      Cardiovascular (-) hypertension(-) angina(-) CAD, (-) Past MI, (-) Cardiac Stents and (-) CABG negative cardio ROS   Rhythm:Regular Rate:Normal - Systolic murmurs and - Diastolic murmurs    Neuro/Psych neg Seizures negative neurological ROS  negative psych ROS   GI/Hepatic negative GI ROS, Neg liver ROS,   Endo/Other  diabetes, Poorly Controlled, Type 2, Insulin Dependent  Renal/GU negative Renal ROS     Musculoskeletal negative musculoskeletal ROS (+)   Abdominal (+) - obese,   Peds  Hematology  (+) Blood dyscrasia, anemia ,   Anesthesia Other Findings 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, I&D of right foot & bone biopsy second metatarsal of right foot on 8/27 as per podiatry  Past Medical History: No date: Diabetes mellitus without complication ([facility]) No date: Neuropathy No date: Sarcoidosis No date: Sleep apnea   Reproductive/Obstetrics                             Anesthesia Physical  Anesthesia Plan  ASA: 3  Anesthesia Plan: General   Post-op Pain Management:    Induction: Intravenous  PONV Risk Score and Plan: 2 and Propofol infusion, TIVA, Midazolam, Ondansetron and Dexamethasone  Airway Management Planned: Natural Airway  Additional  Equipment: None  Intra-op Plan:   Post-operative Plan:   Informed Consent: I have reviewed the patients History and Physical, chart, labs and discussed the procedure including the risks, benefits and alternatives for the proposed anesthesia with the patient or authorized representative who has indicated his/her understanding and acceptance.     Dental advisory given  Plan Discussed with: CRNA and Surgeon  Anesthesia Plan Comments: (Discussed risks of anesthesia with patient, including possibility of difficulty with spontaneous ventilation under anesthesia necessitating airway intervention, PONV, and rare risks such as cardiac or respiratory or neurological events, and allergic reactions. Patient understands.)        Anesthesia Quick Evaluation

## 2020-12-13 NOTE — Plan of Care (Signed)

## 2020-12-13 NOTE — Consult Note (Signed)
NAME: Fernando Vega  DOB: September 14, 1970  MRN: [id number]  Date/Time: 12/13/2020 12:58 PM  REQUESTING PROVIDER: Dr.Amery Subjective:  REASON FOR CONSULT: rt foot infection ?pt known to me from last admission , I have been following him as OP Fernando Vega is a 50 y.o. with a history of DM, diabetic foot infection . 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  HE did not have any pain , but noticed blood from rt foot- cleaned with peroxide and epsom salt. As it was getting worse and he noted some foul smelling drainage came to the hospital . Dr. Daylene Katayama took him to the operating room on 11/05/2020 for removal of partial second and third ray amputation of the right foot.  Wound culture positive for staph aureus.  On 11/09/2020 patient brought back to the OR by Dr. Amalia Hailey for irrigation debridement and apply wound VAC.  as no home health service could be arranged due to lack of insurance th wound vac was removed and wet to dry dressing ordered.  The patient had a PICC line placed and sent on Ancef 2 g every 8 hours until 12/12/2020 (total of 6 weeks).  Dr.Evans saw him on 11/26/20 and he felt that a wound vac would have benefited him and as he had no insurance he was continued on wet - dry dressing He went to wound clinic on 11/27/20 and was prescribed clindamycin to take with Iv cefazolin I saw him on 12/07/20- The foot wound was not looking good and I took cultures.   Labs on 12/07/20 revealed wbc of 6.1, cr 0.67.CRP 10.7( was 2.4 on 11/30/20)  The result of the culture finalized on 12/11/20 as acinetobacter, enterobacter cloacae. He came to the ED the same day with worsening swelling, pain, and redness and was hospitalized In  the Ed vitals were BP 165/110, Temp 98.2, HR 95 and sats 97% WBC 8.4, HB 10.8, PLT 392 Cr 0.74 He was started on IV vanco and meropenem MRI of the foot showed Surgical changes from recent amputation of the second and third toes and proximal second and third metatarsals.2. Findings consistent with osteomyelitis involving the remaining second and third metatarsals and also the fourth metatarsal. 3. Associated severe myofasciitis, cellulitis and multiple small abscesses. 4. Patchy abnormal T1 and T2 signal intensity in the first metatarsal and first proximal phalanx may be reactive change and not likely osteomyelitis. Seen by podiatrist over the weekend and he is taking him for TMA today, BKA was discussed and patient was not amenable at this time Also incidentally tested positive for covid  Pt says to me he wont go home with IV antibiotics this time He has no fever, has a cough, no nasal symptoms, no sob  Past Medical History:  Diagnosis Date   Diabetes mellitus without complication ([place])    Neuropathy    Sarcoidosis    Sleep apnea     Past Surgical History:  Procedure Laterality Date   AMPUTATION Right 11/05/2020   Procedure: AMPUTATION RAY-Partial Ray Amp 2nd & 3rd Digit;  Surgeon: Edrick Kins, DPM;  Location: [facility];  Service: Podiatry;  Laterality: Right;   APPLICATION OF WOUND VAC  Right 11/09/2020   Procedure: APPLICATION OF WOUND VAC;  Surgeon: Edrick Kins, DPM;  Location: [facility];  Service: Podiatry;  Laterality: Right;   INCISION AND DRAINAGE Right 11/01/2020   Procedure: INCISION AND DRAINAGE-Right Foot;  Surgeon: Criselda Peaches, DPM;  Location: [facility];  Service: Podiatry;  Laterality: Right;   IRRIGATION AND DEBRIDEMENT FOOT Right 11/09/2020   Procedure: IRRIGATION AND DEBRIDEMENT FOOT;  Surgeon: Edrick Kins, DPM;  Location: [facility];  Service: Podiatry;  Laterality: Right;    Social History   Socioeconomic History   Marital status: Married    Spouse name: Not on  file   Number of children: Not on file   Years of education: Not on file   Highest education level: Not on file  Occupational History   Not on file  Tobacco Use   Smoking status: Never   Smokeless tobacco: Never  Substance and Sexual Activity   Alcohol use: No   Drug use: No   Sexual activity: Not on file  Other Topics Concern   Not on file  Social History Narrative   Not on file   Social Determinants of Health   Financial Resource Strain: Not on file  Food Insecurity: Not on file  Transportation Needs: Not on file  Physical Activity: Not on file  Stress: Not on file  Social Connections: Not on file  Intimate Partner Violence: Not on file    Family History  Problem Relation Age of Onset   Hypertension Mother    Diabetes type II Father    Parkinson's disease Father    Heart disease Father    No Known Allergies I? Current Facility-Administered Medications  Medication Dose Route Frequency Provider Last Rate Last Admin   albuterol (PROVENTIL) (2.5 MG/3ML) 0.083% nebulizer solution 2.5 mg  2.5 mg Nebulization Q6H PRN Para Skeans, MD       amLODipine (NORVASC) tablet 5 mg  5 mg Oral Daily Nolberto Hanlon, MD   5 mg at 12/13/20 7782   Chlorhexidine Gluconate Cloth 2 % PADS 6 each  6 each Topical Daily Nolberto Hanlon, MD   6 each at 12/13/20 0832   feeding supplement (GLUCERNA SHAKE) (GLUCERNA SHAKE) liquid 237 mL  237 mL Oral BID BM Nolberto Hanlon, MD   237 mL at 12/12/20 1456   gabapentin (NEURONTIN) capsule 100 mg  100 mg Oral QHS Florina Ou V, MD   100 mg at 12/12/20 2208   heparin injection 5,000 Units  5,000 Units Subcutaneous Q8H Florina Ou V, MD   5,000 Units at 12/13/20 0504   hydrALAZINE (APRESOLINE) injection 10 mg  10 mg Intravenous Q6H PRN Para Skeans, MD       insulin aspart (novoLOG) injection 0-5 Units  0-5 Units Subcutaneous QHS Florina Ou V, MD   2 Units at 12/12/20 2207   insulin aspart (novoLOG) injection 0-9 Units  0-9 Units Subcutaneous TID WC Florina Ou  V, MD   2 Units at 12/13/20 1203   [START ON 12/14/2020] insulin glargine-yfgn (SEMGLEE) injection 5 Units  5 Units Subcutaneous Daily Nolberto Hanlon, MD       meropenem (MERREM) 1 g in sodium chloride 0.9 % 100 mL IVPB  1 g Intravenous Q8H Amery, Gwynneth Albright, MD 200 mL/hr at 12/13/20 0503 1 g at 12/13/20 0503   multivitamin with minerals tablet 1 tablet  1 tablet Oral Daily Nolberto Hanlon, MD   1 tablet at 12/13/20 0831   pantoprazole (PROTONIX) EC tablet 40 mg  40 mg Oral Daily Nolberto Hanlon, MD   40 mg at 12/13/20 0831   protein supplement (ENSURE MAX) liquid  11 oz Oral QHS Nolberto Hanlon, MD   11 oz at 12/12/20 2208   vancomycin (VANCOREADY) IVPB 1250 mg/250 mL  1,250 mg Intravenous Q12H Darnelle Bos, RPH 166.7 mL/hr at 12/13/20 2248 1,250 mg at 12/13/20 2500     Abtx:  Anti-infectives (From admission, onward)    Start     Dose/Rate Route Frequency Ordered Stop   12/12/20 1100  meropenem (MERREM) 1 g in sodium chloride 0.9 % 100 mL IVPB        1 g 200 mL/hr over 30 Minutes Intravenous Every 8 hours 12/12/20 0959     12/12/20 0900  vancomycin (VANCOREADY) IVPB 1250 mg/250 mL        1,250 mg 166.7 mL/hr over 90 Minutes Intravenous Every 12 hours 12/11/20 2217     12/11/20 2245  piperacillin-tazobactam (ZOSYN) IVPB 3.375 g  Status:  Discontinued        3.375 g 12.5 mL/hr over 240 Minutes Intravenous Every 8 hours 12/11/20 2242 12/12/20 0959   12/11/20 2215  vancomycin (VANCOREADY) IVPB 750 mg/150 mL        750 mg 150 mL/hr over 60 Minutes Intravenous  Once 12/11/20 2204 12/12/20 0214   12/11/20 2115  vancomycin (VANCOCIN) IVPB 1000 mg/200 mL premix        1,000 mg 200 mL/hr over 60 Minutes Intravenous  Once 12/11/20 2106 12/11/20 2223       REVIEW OF SYSTEMS:  Const: negative fever, negative chills, negative weight loss Eyes: negative diplopia or visual changes, negative eye pain ENT: negative coryza, negative sore throat Resp: mild cough, hemoptysis, no dyspnea Cards: negative for chest  pain, palpitations, lower extremity edema GU: negative for frequency, dysuria and hematuria GI: Negative for abdominal pain, diarrhea, bleeding, constipation Skin: negative for rash and pruritus Heme: negative for easy bruising and gum/nose bleeding MS: rt foot pain Neurolo:negative for headaches, dizziness, vertigo, memory problems  Psych: negative for feelings of anxiety, depression  Endocrine: , diabetes not well controlled Allergy/Immunology- negative for any medication or food allergies ? Objective:  VITALS:  BP (!) 153/87 (BP Location: Left Arm)   Pulse 87   Temp 98.6 F (37 C)   Resp 16   Ht 5\' 6"  (1.676 m)   Wt 84 kg   SpO2 96%   BMI 29.89 kg/m  PHYSICAL EXAM:  General: Alert, cooperative, no distress, appears stated age. pale Head: Normocephalic, without obvious abnormality, atraumatic. Eyes: Conjunctivae clear, anicteric sclerae. Pupils are equal ENT Nares normal. No drainage or sinus tenderness. Lips, mucosa, and tongue normal. No Thrush Neck: Supple, symmetrical, no adenopathy, thyroid: non tender no carotid bruit and no JVD. Back: No CVA tenderness. Lungs: Clear to auscultation bilaterally. No Wheezing or Rhonchi. No rales. Heart: Regular rate and rhythm, no murmur, rub or gallop. Abdomen: Soft, non-tender,not distended. Bowel sounds normal. No masses Extremities:  Rt PICC rt foot- dressing in place and he is going for surgery now 12/07/20- picture shows slough and eschar with surrounding erythema   Skin: No rashes or lesions. Or bruising Lymph: Cervical, supraclavicular normal. Neurologic: Grossly non-focal Pertinent Labs Lab Results CBC    Component Value Date/Time   WBC 7.2 12/12/2020 0550   RBC 3.65 (L) 12/12/2020 0550   HGB 10.0 (L) 12/12/2020 0550   HGB 13.6 12/03/2012 0520   HCT 32.5 (L) 12/12/2020 0550   HCT 39.8 (L) 12/03/2012  0520   PLT 376 12/12/2020 0550   PLT 171 12/03/2012 0520   MCV 89.0 12/12/2020 0550   MCV 91 12/03/2012 0520    MCH 27.4 12/12/2020 0550   MCHC 30.8 12/12/2020 0550   RDW 13.2 12/12/2020 0550   RDW 12.4 12/03/2012 0520   LYMPHSABS 2.2 12/11/2020 1835   LYMPHSABS 2.7 12/03/2012 0520   MONOABS 0.8 12/11/2020 1835   MONOABS 1.5 (H) 12/03/2012 0520   EOSABS 0.1 12/11/2020 1835   EOSABS 0.3 12/03/2012 0520   BASOSABS 0.0 12/11/2020 1835   BASOSABS 0.0 12/03/2012 0520    CMP Latest Ref Rng & Units 12/12/2020 12/11/2020 12/07/2020  Glucose 70 - 99 mg/dL 158(H) 215(H) 177(H)  BUN 6 - 20 mg/dL 10 11 16   Creatinine 0.61 - 1.24 mg/dL 0.74 0.74 0.67  Sodium 135 - 145 mmol/L 141 137 136  Potassium 3.5 - 5.1 mmol/L 3.7 3.8 3.8  Chloride 98 - 111 mmol/L 102 100 100  CO2 22 - 32 mmol/L 30 26 27   Calcium 8.9 - 10.3 mg/dL 8.7(L) 8.6(L) 8.4(L)  Total Protein 6.5 - 8.1 g/dL 6.8 7.7 7.0  Total Bilirubin 0.3 - 1.2 mg/dL 0.7 0.8 0.6  Alkaline Phos 38 - 126 U/L 70 78 75  AST 15 - 41 U/L 14(L) 17 17  ALT 0 - 44 U/L 7 8 7       Microbiology: Recent Results (from the past 240 hour(s))  Aerobic Culture w Gram Stain (superficial specimen)     Status: None   Collection Time: 12/07/20 12:14 PM   Specimen: Wound  Result Value Ref Range Status   Specimen Description   Final    WOUND Performed at [facility], [address], [place], [place] [address]    Special Requests   Final    RIGHT FOOT WOUND Performed at [facility], [address] [place], [place] [address]    Gram Stain   Final    NO SQUAMOUS EPITHELIAL CELLS SEEN FEW WBC SEEN FEW GRAM POSITIVE COCCI Performed at [facility], [place] [address], [place], [place] [address]    Culture   Final    MODERATE ACINETOBACTER CALCOACETICUS/BAUMANNII COMPLEX MODERATE ENTEROBACTER CLOACAE    Report Status 12/11/2020 FINAL  Final   Organism ID, Bacteria ACINETOBACTER CALCOACETICUS/BAUMANNII COMPLEX  Final   Organism ID, Bacteria ENTEROBACTER CLOACAE  Final      Susceptibility   Acinetobacter calcoaceticus/baumannii complex - MIC*     CEFTAZIDIME 8 SENSITIVE Sensitive     CIPROFLOXACIN <=0.25 SENSITIVE Sensitive     GENTAMICIN <=1 SENSITIVE Sensitive     IMIPENEM <=0.25 SENSITIVE Sensitive     PIP/TAZO >=128 RESISTANT Resistant     TRIMETH/SULFA <=20 SENSITIVE Sensitive     AMPICILLIN/SULBACTAM 4 SENSITIVE Sensitive     * MODERATE ACINETOBACTER CALCOACETICUS/BAUMANNII COMPLEX   Enterobacter cloacae - MIC*    CEFAZOLIN >=64 RESISTANT Resistant     CEFEPIME <=0.12 SENSITIVE Sensitive     CEFTAZIDIME <=1 SENSITIVE Sensitive     CIPROFLOXACIN <=0.25 SENSITIVE Sensitive     GENTAMICIN <=1 SENSITIVE Sensitive     IMIPENEM 0.5 SENSITIVE Sensitive     TRIMETH/SULFA <=20 SENSITIVE Sensitive     PIP/TAZO <=4 SENSITIVE Sensitive     * MODERATE ENTEROBACTER CLOACAE  Resp Panel by RT-PCR (Flu A&B, Covid) Nasopharyngeal Swab     Status: Abnormal   Collection Time: 12/11/20  9:25 PM   Specimen: Nasopharyngeal Swab; Nasopharyngeal(NP) swabs in vial transport medium  Result Value Ref Range  Status   SARS Coronavirus 2 by RT PCR POSITIVE (A) NEGATIVE Final    Comment: RESULT CALLED TO, READ BACK BY AND VERIFIED WITH: JULIA SHEEHAN @ 2236 12/11/20 LFD (NOTE) SARS-CoV-2 target nucleic acids are DETECTED.  The SARS-CoV-2 RNA is generally detectable in upper respiratory specimens during the acute phase of infection. Positive results are indicative of the presence of the identified virus, but do not rule out bacterial infection or co-infection with other pathogens not detected by the test. Clinical correlation with patient history and other diagnostic information is necessary to determine patient infection status. The expected result is Negative.  Fact Sheet for Patients: EntrepreneurPulse.com.au  Fact Sheet for Healthcare Providers: IncredibleEmployment.be  This test is not yet approved or cleared by the [place] FDA and  has been authorized for detection and/or diagnosis of SARS-CoV-2  by FDA under an Emergency Use Authorization (EUA).  This EUA will remain in effect (meaning this test can be  used) for the duration of  the COVID-19 declaration under Section 564(b)(1) of the Act, 21 U.S.C. section 360bbb-3(b)(1), unless the authorization is terminated or revoked sooner.     Influenza A by PCR NEGATIVE NEGATIVE Final   Influenza B by PCR NEGATIVE NEGATIVE Final    Comment: (NOTE) The Xpert Xpress SARS-CoV-2/FLU/RSV plus assay is intended as an aid in the diagnosis of influenza from Nasopharyngeal swab specimens and should not be used as a sole basis for treatment. Nasal washings and aspirates are unacceptable for Xpert Xpress SARS-CoV-2/FLU/RSV testing.  Fact Sheet for Patients: EntrepreneurPulse.com.au  Fact Sheet for Healthcare Providers: IncredibleEmployment.be  This test is not yet approved or cleared by the [place] FDA and has been authorized for detection and/or diagnosis of SARS-CoV-2 by FDA under an Emergency Use Authorization (EUA). This EUA will remain in effect (meaning this test can be used) for the duration of the COVID-19 declaration under Section 564(b)(1) of the Act, 21 U.S.C. section 360bbb-3(b)(1), unless the authorization is terminated or revoked.  Performed at [facility], [address] [place], [place] [address]     IMAGING RESULTS: MRI Surgical changes from recent amputation of the second and third toes and proximal second and third metatarsals. 2. Findings consistent with osteomyelitis involving the remaining second and third metatarsals and also the fourth metatarsal. 3. Associated severe myofasciitis, cellulitis and multiple small abscesses. 4. Patchy abnormal T1 and T2 signal intensity in the first metatarsal and first proximal phalanx may be reactive change and not likely osteomyelitis. I have personally reviewed the films ? Impression/Recommendation ?Diabetic foot infection  with necrotizing wound- s/p amputation of 2/3 toes in aug, and staph aureus in culture- was sent home on Iv cefazolin but eturns with worsening infection Acinetobacter and enterobacter and is on meropenem and vanco Going for TMA now  DM- poorly controlled  Peripheral neuropathy  Anemia  Tested for COVID- minimally symptomatic- no hypoxia, pulse ox 99% on RA will get a baseline CXR-  ct value 28 Not vaccinated ?treat if symptomatic /hypoxia ? ___________________________________________________ Discussed with patient, requesting provider Note:  This document was prepared using Dragon voice recognition software and may include unintentional dictation errors.

## 2020-12-13 NOTE — Progress Notes (Signed)
PROGRESS NOTE    Fernando Vega  VOZ:[id number] DOB: 1970-06-12 DOA: 12/11/2020 PCP: Langston Reusing, NP    Brief Narrative:  Fernando Vega is a 50 y.o. male seen in ed with complaints of worsening drainage erythema on right foot.  Patient was seen by wound care clinic on 12/08/2020, and patient has been receiving antibiotics via his PICC line the wound has gotten swollen and painful.. Pt had  MRI on the 12/07/2020, and was found to have Osteomyelitis. Pt sees Dr.Evans podiatry and is [facility]. Pt states he has cut down pasta and potato but his sugars are still staying high. Pt denies any other complaints and ros is negative. Pt coming to [place] from home lives with wife. NEVER SMOKED, although he has had secondhand exposure since childhood as both parents were smokers, no alcohol or drugs.  Father has a strong history of heart disease and died from a massive MI, mom is alive.   Patient was admitted for 11/01/2020 - 11/16/2020 hospitalization until amputation for necrotizing soft tissue infection of the right foot involving the right second MTP.  Patient was growing staph aureus on the wound culture, and was discharged after amputation IV antibiotics with a wound VAC, home health services were not able to be arranged due to insurance issues and wound VAC To be removed.  Patient did have a PICC line that was placed and patient was on Ancef 2 g every 8 hours until 12/12/2020 making it a total of 6 weeks.  Patient was seen by infectious disease on 12/07/2020.  Patient was also started on clindamycin by the wound care clinic on 12/01/2020.  Patient had a specialist hospital follow-up visit had no fever and was doing well. MSSA and GBS in the wound culture from surgery on 11/01/20- has been on cefazolin IV which he will complete tomorrow on 10/2.  10/3 going for surgery tonight.  Has no complaints.  Denies any pain, shortness of breath, chest pain or lower extremity pain  Consultants:  podiatry  Procedures:   MRI from 9/27 1. Surgical changes from recent amputation of the second and third toes and proximal second and third metatarsals. 2. Findings consistent with osteomyelitis involving the remaining second and third metatarsals and also the fourth metatarsal. 3. Associated severe myofasciitis, cellulitis and multiple small abscesses. 4. Patchy abnormal T1 and T2 signal intensity in the first metatarsal and first proximal phalanx may be reactive change and not likely osteomyelitis.     Antimicrobials:   Zosyn-d/c'd Started on meropenem and vanco   Subjective: No nausea vomiting  Objective: Vitals:   12/12/20 1205 12/12/20 1623 12/12/20 1932 12/13/20 0500  BP: (!) 160/82 (!) 147/72 (!) 161/88 129/78  Pulse: 88 82 88 93  Resp: 16 18 18 18   Temp: 98.2 F (36.8 C) 98.4 F (36.9 C) 97.8 F (36.6 C) 98.1 F (36.7 C)  TempSrc:    Oral  SpO2: 98% 97% 92% 97%  Weight:      Height:        Intake/Output Summary (Last 24 hours) at 12/13/2020 0756 Last data filed at 12/13/2020 0300 Gross per 24 hour  Intake 240 ml  Output --  Net 240 ml   Filed Weights   12/11/20 1833  Weight: 84 kg    Examination:  Calm, nad Cta no w/r Rrr s1/s2 no gallop Soft benign +bs RLE edema>LE aaxoxo3    Data Reviewed: I have personally reviewed following labs and imaging studies  CBC: Recent Labs  Lab 12/07/20 1305 12/11/20 1835 12/12/20 0550  WBC 6.1 8.4 7.2  NEUTROABS 2.8 5.3  --   HGB 9.9* 10.8* 10.0*  HCT 30.5* 34.0* 32.5*  MCV 89.4 87.2 89.0  PLT 274 392 656   Basic Metabolic Panel: Recent Labs  Lab 12/07/20 1305 12/11/20 1835 12/12/20 0550  NA 136 137 141  K 3.8 3.8 3.7  CL 100 100 102  CO2 27 26 30   GLUCOSE 177* 215* 158*  BUN 16 11 10   CREATININE 0.67 0.74 0.74  CALCIUM 8.4* 8.6* 8.7*   GFR: Estimated Creatinine Clearance: 109.8 mL/min (by C-G formula based on SCr of 0.74 mg/dL). Liver Function Tests: Recent Labs  Lab 12/07/20 1305 12/11/20 1835  12/12/20 0550  AST 17 17 14*  ALT 7 8 7   ALKPHOS 75 78 70  BILITOT 0.6 0.8 0.7  PROT 7.0 7.7 6.8  ALBUMIN 3.0* 3.4* 3.1*   No results for input(s): LIPASE, AMYLASE in the last 168 hours. No results for input(s): AMMONIA in the last 168 hours. Coagulation Profile: Recent Labs  Lab 12/12/20 0550  INR 1.1   Cardiac Enzymes: No results for input(s): CKTOTAL, CKMB, CKMBINDEX, TROPONINI in the last 168 hours. BNP (last 3 results) No results for input(s): PROBNP in the last 8760 hours. HbA1C: Recent Labs    12/11/20 1835  HGBA1C 8.0*   CBG: Recent Labs  Lab 12/11/20 2225 12/12/20 0833 12/12/20 1209 12/12/20 1630 12/12/20 2206  GLUCAP 279* 157* 134* 203* 220*   Lipid Profile: No results for input(s): CHOL, HDL, LDLCALC, TRIG, CHOLHDL, LDLDIRECT in the last 72 hours. Thyroid Function Tests: No results for input(s): TSH, T4TOTAL, FREET4, T3FREE, THYROIDAB in the last 72 hours. Anemia Panel: No results for input(s): VITAMINB12, FOLATE, FERRITIN, TIBC, IRON, RETICCTPCT in the last 72 hours. Sepsis Labs: Recent Labs  Lab 12/11/20 1835 12/12/20 0120  LATICACIDVEN 0.7 1.0    Recent Results (from the past 240 hour(s))  Aerobic Culture w Gram Stain (superficial specimen)     Status: None   Collection Time: 12/07/20 12:14 PM   Specimen: Wound  Result Value Ref Range Status   Specimen Description   Final    WOUND Performed at [facility], [address], [place], [place] [address]    Special Requests   Final    RIGHT FOOT WOUND Performed at [facility], [address] [place], [place] [address]    Gram Stain   Final    NO SQUAMOUS EPITHELIAL CELLS SEEN FEW WBC SEEN FEW GRAM POSITIVE COCCI Performed at [facility], [place] [address] [place], [place] [address]    Culture   Final    MODERATE ACINETOBACTER CALCOACETICUS/BAUMANNII COMPLEX MODERATE ENTEROBACTER CLOACAE    Report Status 12/11/2020 FINAL  Final   Organism ID, Bacteria  ACINETOBACTER CALCOACETICUS/BAUMANNII COMPLEX  Final   Organism ID, Bacteria ENTEROBACTER CLOACAE  Final      Susceptibility   Acinetobacter calcoaceticus/baumannii complex - MIC*    CEFTAZIDIME 8 SENSITIVE Sensitive     CIPROFLOXACIN <=0.25 SENSITIVE Sensitive     GENTAMICIN <=1 SENSITIVE Sensitive     IMIPENEM <=0.25 SENSITIVE Sensitive     PIP/TAZO >=128 RESISTANT Resistant     TRIMETH/SULFA <=20 SENSITIVE Sensitive     AMPICILLIN/SULBACTAM 4 SENSITIVE Sensitive     * MODERATE ACINETOBACTER CALCOACETICUS/BAUMANNII COMPLEX   Enterobacter cloacae - MIC*    CEFAZOLIN >=64 RESISTANT Resistant     CEFEPIME <=0.12 SENSITIVE Sensitive     CEFTAZIDIME <=1 SENSITIVE Sensitive  CIPROFLOXACIN <=0.25 SENSITIVE Sensitive     GENTAMICIN <=1 SENSITIVE Sensitive     IMIPENEM 0.5 SENSITIVE Sensitive     TRIMETH/SULFA <=20 SENSITIVE Sensitive     PIP/TAZO <=4 SENSITIVE Sensitive     * MODERATE ENTEROBACTER CLOACAE  Resp Panel by RT-PCR (Flu A&B, Covid) Nasopharyngeal Swab     Status: Abnormal   Collection Time: 12/11/20  9:25 PM   Specimen: Nasopharyngeal Swab; Nasopharyngeal(NP) swabs in vial transport medium  Result Value Ref Range Status   SARS Coronavirus 2 by RT PCR POSITIVE (A) NEGATIVE Final    Comment: RESULT CALLED TO, READ BACK BY AND VERIFIED WITH: JULIA SHEEHAN @ 2236 12/11/20 LFD (NOTE) SARS-CoV-2 target nucleic acids are DETECTED.  The SARS-CoV-2 RNA is generally detectable in upper respiratory specimens during the acute phase of infection. Positive results are indicative of the presence of the identified virus, but do not rule out bacterial infection or co-infection with other pathogens not detected by the test. Clinical correlation with patient history and other diagnostic information is necessary to determine patient infection status. The expected result is Negative.  Fact Sheet for Patients: EntrepreneurPulse.com.au  Fact Sheet for Healthcare  Providers: IncredibleEmployment.be  This test is not yet approved or cleared by the [place] FDA and  has been authorized for detection and/or diagnosis of SARS-CoV-2 by FDA under an Emergency Use Authorization (EUA).  This EUA will remain in effect (meaning this test can be  used) for the duration of  the COVID-19 declaration under Section 564(b)(1) of the Act, 21 U.S.C. section 360bbb-3(b)(1), unless the authorization is terminated or revoked sooner.     Influenza A by PCR NEGATIVE NEGATIVE Final   Influenza B by PCR NEGATIVE NEGATIVE Final    Comment: (NOTE) The Xpert Xpress SARS-CoV-2/FLU/RSV plus assay is intended as an aid in the diagnosis of influenza from Nasopharyngeal swab specimens and should not be used as a sole basis for treatment. Nasal washings and aspirates are unacceptable for Xpert Xpress SARS-CoV-2/FLU/RSV testing.  Fact Sheet for Patients: EntrepreneurPulse.com.au  Fact Sheet for Healthcare Providers: IncredibleEmployment.be  This test is not yet approved or cleared by the [place] FDA and has been authorized for detection and/or diagnosis of SARS-CoV-2 by FDA under an Emergency Use Authorization (EUA). This EUA will remain in effect (meaning this test can be used) for the duration of the COVID-19 declaration under Section 564(b)(1) of the Act, 21 U.S.C. section 360bbb-3(b)(1), unless the authorization is terminated or revoked.  Performed at [facility], [address] [place],  [address]          Radiology Studies: US ARTERIAL ABI (SCREENING LOWER EXTREMITY)  Result Date: 12/12/2020 CLINICAL DATA:  Diabetic foot wound, osteomyelitis EXAM: NONINVASIVE PHYSIOLOGIC VASCULAR STUDY OF BILATERAL LOWER EXTREMITIES TECHNIQUE: Evaluation of both lower extremities were performed at rest, including calculation of ankle-brachial indices with single level Doppler, pressure and  pulse volume recording. COMPARISON:  None. FINDINGS: Right ABI:  1.08 Left ABI:  1.06 Right Lower Extremity:  Normal arterial waveforms at the ankle. Left Lower Extremity:  Normal arterial waveforms at the ankle. 1.0-1.4 Normal IMPRESSION: Normal bilateral resting ankle-brachial indices. Electronically Signed   By: Jacqulynn Cadet M.D.   On: 12/12/2020 19:06        Scheduled Meds:  amLODipine  5 mg Oral Daily   Chlorhexidine Gluconate Cloth  6 each Topical Daily   feeding supplement (GLUCERNA SHAKE)  237 mL Oral BID BM   gabapentin  100 mg Oral QHS  heparin  5,000 Units Subcutaneous Q8H   insulin aspart  0-5 Units Subcutaneous QHS   insulin aspart  0-9 Units Subcutaneous TID WC   multivitamin with minerals  1 tablet Oral Daily   pantoprazole  40 mg Oral Daily   Ensure Max Protein  11 oz Oral QHS   Continuous Infusions:  meropenem (MERREM) IV 1 g (12/13/20 0503)   vancomycin 1,250 mg (12/12/20 2006)    Assessment & Plan:   Principal Problem:   Diabetic foot infection ([place]) Active Problems:   Type 2 diabetes mellitus with peripheral neuropathy ([place])   Sleep apnea   Anemia   Osteomyelitis due to type 2 diabetes mellitus (HCC)   Elevated blood pressure reading   Diabetic foot infection/osteomyelitis: Started on meropenem and vancomycin  CRP elevated  Has been worsening despite his current IV antibiotics as outpatient. 10/3 podiatry following , input was appreciated.  They do not think his wound will heal without further bone resection.  He wishes to save his foot.  Plan for surgery this evening for open midfoot amputation ID consulted this a.m. Wound care packed open with iodoform After surgery will be strict NWB on the right side ABI normal bilateral resting ankle-brachial indices  DM II; Diabetic educator consulted Had breakfast but he will be n.p.o. Continue R-ISS Continue good glycemic control Start Semglee 5 units daily Start NovoLog with meals 3 units 3 times  daily on 10/4 after p.o. diet resumed     Anemia: H&H stable Stool guaiac was ordered pending No symptoms of hematochezia, melena or hemoptysis.  Patient 10/3 continue to monitor periodically Continue PPI    OSA: On CPAP continue       DVT prophylaxis: Heparin Code Status: Full Family Communication: None at bedside Disposition Plan:  Status is: Inpatient  Remains inpatient appropriate because:IV treatments appropriate due to intensity of illness or inability to take PO  Dispo: The patient is from: Home              Anticipated d/c is to: Home              Patient currently is not medically stable to d/c.   Difficult to place patient No            LOS: 2 days   Time spent: 35 minutes with more than 50% on [place], MD [facility] Pager [phone]  If 7PM-7AM, please contact night-coverage 12/13/2020, 7:56 AM

## 2020-12-13 NOTE — Transfer of Care (Signed)
Immediate Anesthesia Transfer of Care Note  Patient: Fernando Vega  Procedure(s) Performed: IRRIGATION AND DEBRIDEMENT FOOT (Right) AMPUTATION midFOOT-RIGHT (Right: Foot)  Patient Location: Nursing Unit  Anesthesia Type:General  Level of Consciousness: awake, alert  and oriented  Airway & Oxygen Therapy: Patient Spontanous Breathing  Post-op Assessment: Report given to RN and Post -op Vital signs reviewed and stable  Post vital signs: Reviewed and stable  Last Vitals:  Vitals Value Taken Time  BP 135/86 12/13/20 2009  Temp 36.8 C 12/13/20 2008  Pulse 84 12/13/20 2009  Resp 16 12/13/20 2009  SpO2 94 % 12/13/20 2009    Last Pain:  Vitals:   12/13/20 0500  TempSrc: Oral  PainSc:       Patients Stated Pain Goal: 3 (75/30/05 1102)  Complications: No notable events documented.

## 2020-12-14 ENCOUNTER — Encounter: Payer: Self-pay | Admitting: Podiatry

## 2020-12-14 ENCOUNTER — Inpatient Hospital Stay: Payer: Medicaid Other

## 2020-12-14 LAB — BASIC METABOLIC PANEL
Anion gap: 9 (ref 5–15)
BUN: 12 mg/dL (ref 6–20)
CO2: 27 mmol/L (ref 22–32)
Calcium: 8.6 mg/dL — ABNORMAL LOW (ref 8.9–10.3)
Chloride: 98 mmol/L (ref 98–111)
Creatinine, Ser: 0.74 mg/dL (ref 0.61–1.24)
GFR, Estimated: >60 mL/min (ref >60)
Glucose, Bld: 244 mg/dL — ABNORMAL HIGH (ref 70–99)
Potassium: 3.9 mmol/L (ref 3.5–5.1)
Sodium: 134 mmol/L — ABNORMAL LOW (ref 135–145)

## 2020-12-14 LAB — GLUCOSE, CAPILLARY
Glucose-Capillary: 200 mg/dL — ABNORMAL HIGH (ref 70–99)
Glucose-Capillary: 314 mg/dL — ABNORMAL HIGH (ref 70–99)
Glucose-Capillary: 255 mg/dL — ABNORMAL HIGH (ref 70–99)
Glucose-Capillary: 271 mg/dL — ABNORMAL HIGH (ref 70–99)

## 2020-12-14 LAB — CBC
HCT: 33.7 % — ABNORMAL LOW (ref 39.0–52.0)
Hemoglobin: 11 g/dL — ABNORMAL LOW (ref 13.0–17.0)
MCH: 28.4 pg (ref 26.0–34.0)
MCHC: 32.6 g/dL (ref 30.0–36.0)
MCV: 86.9 fL (ref 80.0–100.0)
Platelets: 401 10*3/uL — ABNORMAL HIGH (ref 150–400)
RBC: 3.88 MIL/uL — ABNORMAL LOW (ref 4.22–5.81)
RDW: 13.4 % (ref 11.5–15.5)
WBC: 13 10*3/uL — ABNORMAL HIGH (ref 4.0–10.5)
nRBC: 0 % (ref 0.0–0.2)

## 2020-12-14 MED ORDER — INSULIN ASPART 100 UNIT/ML IJ SOLN
3.0000 [IU] | Freq: Three times a day (TID) | INTRAMUSCULAR | Status: DC
  Administered 2020-12-14 – 2020-12-15 (×4): 3 [IU] via SUBCUTANEOUS
  Filled 2020-12-14 (×4): qty 1

## 2020-12-14 MED ORDER — INSULIN GLARGINE-YFGN 100 UNIT/ML ~~LOC~~ SOLN
5.0000 [IU] | Freq: Every day | SUBCUTANEOUS | Status: DC
  Administered 2020-12-14 – 2020-12-15 (×2): 5 [IU] via SUBCUTANEOUS
  Filled 2020-12-14 (×3): qty 0.05

## 2020-12-14 NOTE — TOC Progression Note (Signed)
The patient was seen 11/05/2020 for removal of partial second and third ray amputation of the right foot.  Wound culture positive for staph aureus.  On 11/09/2020 patient brought back to the OR by Dr. Amalia Hailey for irrigation debridement and apply wound VAC.  as no home health service could be arranged due to lack of insurance the wound vac was removed and wet to dry dressing ordered.  The patient had a PICC line placed and sent on Ancef 2 g every 8 hours until 12/12/2020 (total of 6 weeks).  He went to wound clinic on 11/27/20 and was prescribed clindamycin to take with Iv cefazolin Pt says to me he wont go home with IV antibiotics this time The patient is not agreeable to BKA at this time, I&D completed 12/13/20 TOC to continue to monitor for needs

## 2020-12-14 NOTE — Progress Notes (Signed)
   PODIATRY PROGRESS NOTE  NAME Fernando Vega MRN [id number] DOB [birth year] DOA 12/11/2020    Chief Complaint  Patient presents with   Wound Infection   Patient s/p 1-day midfoot amputation with irrigation and debridement right foot. DOS: 12/13/2020. Patient feeling well. Denies N/V/F/SOB/CP. Pain well controlled with Norco 5/325mg . Dressings at bedside do have some strikethrough. Dressings have been intact since surgery.   Past Medical History:  Diagnosis Date   Diabetes mellitus without complication ([place])    Neuropathy    Sarcoidosis    Sleep apnea     CBC Latest Ref Rng & Units 12/14/2020 12/12/2020 12/11/2020  WBC 4.0 - 10.5 K/uL 13.0(H) 7.2 8.4  Hemoglobin 13.0 - 17.0 g/dL 11.0(L) 10.0(L) 10.8(L)  Hematocrit 39.0 - 52.0 % 33.7(L) 32.5(L) 34.0(L)  Platelets 150 - 400 K/uL 401(H) 376 392    BMP Latest Ref Rng & Units 12/14/2020 12/12/2020 12/11/2020  Glucose 70 - 99 mg/dL 244(H) 158(H) 215(H)  BUN 6 - 20 mg/dL 12 10 11   Creatinine 0.61 - 1.24 mg/dL 0.74 0.74 0.74  Sodium 135 - 145 mmol/L 134(L) 141 137  Potassium 3.5 - 5.1 mmol/L 3.9 3.7 3.8  Chloride 98 - 111 mmol/L 98 102 100  CO2 22 - 32 mmol/L 27 30 26   Calcium 8.9 - 10.3 mg/dL 8.6(L) 8.7(L) 8.6(L)       Physical Exam: General: The patient is alert and oriented x3 in no acute distress.   S/p midfoot amputation with wet-to-dry dressings applied. Removal of dressings. No active bleeders. No significant purulence or necrotic debris. Erythema appears significantly improved around the rearfoot and ankle. No edema.   ASSESSMENT/PLAN OF CARE S/p right midfoot amputation w/ irrigation and debridement. DOS: 12/13/2020 - amputation stump and foot in general appears healthy and stable. Will plan for return to OR, tomorrow after 5pm, for washout and primary closure. NPO after 9AM - Wet-to-dry dressings reapplied. Keep clean and intact.  - Will follow.  Please contact me directly with any questions or concerns.  Cell  [phone]   Edrick Kins, DPM [facility]  Dr. Edrick Kins, DPM    [address], [place] [address]                Office [phone]  Fax [phone]

## 2020-12-14 NOTE — Progress Notes (Signed)
Date of Admission:  12/11/2020   ID: Fernando Vega is a 50 y.o. male  Principal Problem:   Diabetic foot infection ([place]) Active Problems:   Type 2 diabetes mellitus with peripheral neuropathy (HCC)   Sleep apnea   Anemia   Osteomyelitis (HCC)   Elevated blood pressure reading    Subjective: Had TMA rt foot yesterday Doing better No fever No cough or sob Pain controlled   Medications:   amLODipine  5 mg Oral Daily   Chlorhexidine Gluconate Cloth  6 each Topical Daily   feeding supplement (GLUCERNA SHAKE)  237 mL Oral BID BM   gabapentin  100 mg Oral QHS   heparin  5,000 Units Subcutaneous Q8H   insulin aspart  0-5 Units Subcutaneous QHS   insulin aspart  0-9 Units Subcutaneous TID WC   insulin glargine-yfgn  5 Units Subcutaneous Daily   multivitamin with minerals  1 tablet Oral Daily   pantoprazole  40 mg Oral Daily   Ensure Max Protein  11 oz Oral QHS    Objective: Vital signs in last 24 hours: Temp:  [97.6 F (36.4 C)-98.7 F (37.1 C)] 98.7 F (37.1 C) (10/04 1130) Pulse Rate:  [84-95] 95 (10/04 1130) Resp:  [15-17] 16 (10/04 1130) BP: (132-166)/(77-92) 132/77 (10/04 1130) SpO2:  [94 %-99 %] 99 % (10/04 1130)  PHYSICAL EXAM:  Picture of th rt foot reviewed    Lab Results Recent Labs    12/12/20 0550 12/14/20 0530  WBC 7.2 13.0*  HGB 10.0* 11.0*  HCT 32.5* 33.7*  NA 141 134*  K 3.7 3.9  CL 102 98  CO2 30 27  BUN 10 12  CREATININE 0.74 0.74   Liver Panel Recent Labs    12/11/20 1835 12/12/20 0550  PROT 7.7 6.8  ALBUMIN 3.4* 3.1*  AST 17 14*  ALT 8 7  ALKPHOS 78 70  BILITOT 0.8 0.7    C-Reactive Protein Recent Labs    12/12/20 0120  CRP 5.2*    Microbiology: Few gram neg rods and gram positive cocci in clusters  Studies/Results: DG Chest 2 View  Result Date: 12/14/2020 CLINICAL DATA:  COVID positive EXAM: CHEST - 2 VIEW COMPARISON:  Radiograph 09/01/2019, chest CT 05/06/2018 FINDINGS: Unchanged cardiomediastinal silhouette.  There is a right upper extremity PICC with tip overlying the superior cavoatrial junction. There are mild diffuse interstitial opacities. There is a trace right pleural effusion. No visible pneumothorax. No acute osseous abnormality. IMPRESSION: Mild interstitial opacities diffusely, possibly mild edema. Trace right pleural effusion. Electronically Signed   By: Maurine Simmering M.D.   On: 12/14/2020 12:31   US ARTERIAL ABI (SCREENING LOWER EXTREMITY)  Result Date: 12/12/2020 CLINICAL DATA:  Diabetic foot wound, osteomyelitis EXAM: NONINVASIVE PHYSIOLOGIC VASCULAR STUDY OF BILATERAL LOWER EXTREMITIES TECHNIQUE: Evaluation of both lower extremities were performed at rest, including calculation of ankle-brachial indices with single level Doppler, pressure and pulse volume recording. COMPARISON:  None. FINDINGS: Right ABI:  1.08 Left ABI:  1.06 Right Lower Extremity:  Normal arterial waveforms at the ankle. Left Lower Extremity:  Normal arterial waveforms at the ankle. 1.0-1.4 Normal IMPRESSION: Normal bilateral resting ankle-brachial indices. Electronically Signed   By: Jacqulynn Cadet M.D.   On: 12/12/2020 19:06   DG Foot 2 Views Right  Result Date: 12/14/2020 CLINICAL DATA:  Status post amputation EXAM: RIGHT FOOT - 2 VIEW COMPARISON:  11/05/2020 FINDINGS: There has been interval amputation through the metatarsals of the first fourth and fifth metatarsals. The second and  third metatarsals have been removed. Soft tissue irregularity consistent with the recent surgery is noted. No other focal abnormality is noted. IMPRESSION: Interval amputation as described. Electronically Signed   By: Inez Catalina M.D.   On: 12/14/2020 02:26     Assessment/Plan:  Diabetic rt foot infection-  Underwent 2/3 toes amputation in Aug  2022- had MSSA in culture - was on 6 weeks of IV cefazolin but infection progressed. Repeat culture as Op was acinetobacter and enterobacter ( could be colonizing I the wound)   Underwent TMA  yesterday Cultures have been sent The wound is open and will need closure sometime Continue  meropenem- will DC vanco  SARS cov2 - pt is not symptomatic ( not on oxygen)   Discussed the management with the patient

## 2020-12-14 NOTE — Op Note (Signed)
Patient Name: SECUNDINO ELLITHORPE DOB: 05-11-1970  MRN: [id number]   Date of Service: 12/11/2020 - 12/13/2020  Surgeon: Dr. Lanae Crumbly, DPM Assistants: None Pre-operative Diagnosis:  Right foot osteomyelitis Right foot diabetic foot infection Post-operative Diagnosis:  Right foot osteomyelitis Right foot diabetic foot infection Procedures:  1) open transmetatarsal amputation right foot Pathology/Specimens: ID Type Source Tests Collected by Time Destination  1 : Right Toes and Metatarsals  Tissue PATH Other SURGICAL PATHOLOGY Criselda Peaches, [facility] 12/13/2020 1903   A : Right Foot Bone and Soft Tissue  Tissue PATH Other ANAEROBIC CULTURE W GRAM STAIN Criselda Peaches, DPM 12/13/2020 1901    Anesthesia: MAC with local ankle block Hemostasis:  Total Tourniquet Time Documented: Ankle (Right) - 34 minutes Total: Ankle (Right) - 34 minutes  Estimated Blood Loss: 25 mL Materials: * No implants in log * Medications: 20 cc 0.5% bupivacaine plain Complications: None  Indications for Procedure:  This is a 50 y.o. male with a history of type 2 diabetes and previous diabetic foot infection necessitating partial second and third ray amputations.  He really presents with worsening infection and wound dehiscence and nonhealing.   Procedure in Detail: Patient was identified in pre-operative holding area. Formal consent was signed and the right lower extremity was marked. Patient was brought back to the operating room. Anesthesia was induced. The extremity was prepped and draped in the usual sterile fashion. Timeout was taken to confirm patient name, laterality, and procedure prior to incision.   Attention was then directed to the foot where a fishmouth incision was made in a standard transmetatarsal amputation fashion.  Dissection was carried bluntly and sharply and with Bovie to the level of the mid diaphysis of each metatarsal.  The first fourth and fifth metatarsals were resected using a sagittal saw  and transmetatarsal amputation fashion with appropriate beveling.  Dissection was carried proximally to the second and third TMT joints and they were released from the joint and resected.  Amputation was completed with the plantar flap and the forefoot and second and third metatarsals were sent as a pathologic specimen.  A bone culture was taken for this and sent to microbiology.  Hemostasis was achieved and 3 L normal sterile saline were irrigated using a pulse irrigator.  It was then packed open using iodoform packing, dry sterile dressings and an Ace wrap.  He will return to the operating room at a later date for closure   Disposition: Following a period of post-operative monitoring, patient will be transferred to the floor he will return to the operating room in 2 to 3 days for final closure as part of a staged procedure.[name]

## 2020-12-14 NOTE — Plan of Care (Signed)
No acute events overnight. Pt's pain is well-controlled with current pain regimen.  Problem: Education: Goal: Knowledge of General Education information will improve Description: Including pain rating scale, medication(s)/side effects and non-pharmacologic comfort measures Outcome: Progressing   Problem: Health Behavior/Discharge Planning: Goal: Ability to manage health-related needs will improve Outcome: Progressing   Problem: Clinical Measurements: Goal: Ability to maintain clinical measurements within normal limits will improve Outcome: Progressing Goal: Will remain free from infection Outcome: Progressing Goal: Diagnostic test results will improve Outcome: Progressing Goal: Respiratory complications will improve Outcome: Progressing Goal: Cardiovascular complication will be avoided Outcome: Progressing   Problem: Activity: Goal: Risk for activity intolerance will decrease Outcome: Progressing   Problem: Nutrition: Goal: Adequate nutrition will be maintained Outcome: Progressing   Problem: Coping: Goal: Level of anxiety will decrease Outcome: Progressing   Problem: Elimination: Goal: Will not experience complications related to bowel motility Outcome: Progressing Goal: Will not experience complications related to urinary retention Outcome: Progressing   Problem: Pain Managment: Goal: General experience of comfort will improve Outcome: Progressing   Problem: Safety: Goal: Ability to remain free from injury will improve Outcome: Progressing   Problem: Skin Integrity: Goal: Risk for impaired skin integrity will decrease Outcome: Progressing

## 2020-12-14 NOTE — Progress Notes (Signed)
PROGRESS NOTE    Fernando Vega  TSV:[id number] DOB: August 27, 1970 DOA: 12/11/2020 PCP: Langston Reusing, NP    Brief Narrative:  Fernando Vega is a 50 y.o. male seen in ed with complaints of worsening drainage erythema on right foot.  Patient was seen by wound care clinic on 12/08/2020, and patient has been receiving antibiotics via his PICC line the wound has gotten swollen and painful.. Pt had  MRI on the 12/07/2020, and was found to have Osteomyelitis. Pt sees Dr.Evans podiatry and is [facility]. Pt states he has cut down pasta and potato but his sugars are still staying high. Pt denies any other complaints and ros is negative. Pt coming to [place] from home lives with wife. NEVER SMOKED, although he has had secondhand exposure since childhood as both parents were smokers, no alcohol or drugs.  Father has a strong history of heart disease and died from a massive MI, mom is alive.   Patient was admitted for 11/01/2020 - 11/16/2020 hospitalization until amputation for necrotizing soft tissue infection of the right foot involving the right second MTP.  Patient was growing staph aureus on the wound culture, and was discharged after amputation IV antibiotics with a wound VAC, home health services were not able to be arranged due to insurance issues and wound VAC To be removed.  Patient did have a PICC line that was placed and patient was on Ancef 2 g every 8 hours until 12/12/2020 making it a total of 6 weeks.  Patient was seen by infectious disease on 12/07/2020.  Patient was also started on clindamycin by the wound care clinic on 12/01/2020.  Patient had a specialist hospital follow-up visit had no fever and was doing well. MSSA and GBS in the wound culture from surgery on 11/01/20- has been on cefazolin IV which he will complete tomorrow on 10/2.  10/3 s/p OR for  debridment of Rt foot by Dr. Sherryle Lis.  Consultants:  Podiatry, ID  Procedures:  MRI from 9/27 1. Surgical changes from recent amputation of  the second and third toes and proximal second and third metatarsals. 2. Findings consistent with osteomyelitis involving the remaining second and third metatarsals and also the fourth metatarsal. 3. Associated severe myofasciitis, cellulitis and multiple small abscesses. 4. Patchy abnormal T1 and T2 signal intensity in the first metatarsal and first proximal phalanx may be reactive change and not likely osteomyelitis.     Antimicrobials:   Zosyn-d/c'd  meropenem and vanco   Subjective: Pain control. No v/n/abd pain. No foot pain  Objective: Vitals:   12/13/20 2009 12/13/20 2145 12/14/20 0504 12/14/20 0806  BP: 135/86 139/83 (!) 151/86 (!) 150/86  Pulse: 84 91 91 91  Resp: 16 16 17 15   Temp:  97.8 F (36.6 C) 98.4 F (36.9 C) 97.6 F (36.4 C)  TempSrc:  Oral    SpO2: 94% 94% 95% 97%  Weight:      Height:        Intake/Output Summary (Last 24 hours) at 12/14/2020 0814 Last data filed at 12/14/2020 0744 Gross per 24 hour  Intake 820 ml  Output 2650 ml  Net -1830 ml   Filed Weights   12/11/20 1833  Weight: 84 kg    Examination: Calm, nad Cta no r/w Rrr, s1/s2 no gallop Soft benign +bs No edema, RLE foot wrapped in dressing/ace band [id number]   Data Reviewed: I have personally reviewed following labs and imaging studies  CBC: Recent Labs  Lab 12/07/20 1305  12/11/20 1835 12/12/20 0550 12/14/20 0530  WBC 6.1 8.4 7.2 13.0*  NEUTROABS 2.8 5.3  --   --   HGB 9.9* 10.8* 10.0* 11.0*  HCT 30.5* 34.0* 32.5* 33.7*  MCV 89.4 87.2 89.0 86.9  PLT 274 392 376 827*   Basic Metabolic Panel: Recent Labs  Lab 12/07/20 1305 12/11/20 1835 12/12/20 0550 12/14/20 0530  NA 136 137 141 134*  K 3.8 3.8 3.7 3.9  CL 100 100 102 98  CO2 27 26 30 27   GLUCOSE 177* 215* 158* 244*  BUN 16 11 10 12   CREATININE 0.67 0.74 0.74 0.74  CALCIUM 8.4* 8.6* 8.7* 8.6*   GFR: Estimated Creatinine Clearance: 109.8 mL/min (by C-G formula based on SCr of 0.74 mg/dL). Liver  Function Tests: Recent Labs  Lab 12/07/20 1305 12/11/20 1835 12/12/20 0550  AST 17 17 14*  ALT 7 8 7   ALKPHOS 75 78 70  BILITOT 0.6 0.8 0.7  PROT 7.0 7.7 6.8  ALBUMIN 3.0* 3.4* 3.1*   No results for input(s): LIPASE, AMYLASE in the last 168 hours. No results for input(s): AMMONIA in the last 168 hours. Coagulation Profile: Recent Labs  Lab 12/12/20 0550  INR 1.1   Cardiac Enzymes: No results for input(s): CKTOTAL, CKMB, CKMBINDEX, TROPONINI in the last 168 hours. BNP (last 3 results) No results for input(s): PROBNP in the last 8760 hours. HbA1C: Recent Labs    12/11/20 1835  HGBA1C 8.0*   CBG: Recent Labs  Lab 12/13/20 0813 12/13/20 1143 12/13/20 1631 12/13/20 2238 12/14/20 0807  GLUCAP 205* 181* 121* 346* 200*   Lipid Profile: No results for input(s): CHOL, HDL, LDLCALC, TRIG, CHOLHDL, LDLDIRECT in the last 72 hours. Thyroid Function Tests: No results for input(s): TSH, T4TOTAL, FREET4, T3FREE, THYROIDAB in the last 72 hours. Anemia Panel: No results for input(s): VITAMINB12, FOLATE, FERRITIN, TIBC, IRON, RETICCTPCT in the last 72 hours. Sepsis Labs: Recent Labs  Lab 12/11/20 1835 12/12/20 0120  LATICACIDVEN 0.7 1.0    Recent Results (from the past 240 hour(s))  Aerobic Culture w Gram Stain (superficial specimen)     Status: None   Collection Time: 12/07/20 12:14 PM   Specimen: Wound  Result Value Ref Range Status   Specimen Description   Final    WOUND Performed at [facility], [address], [place], [place] [address]    Special Requests   Final    RIGHT FOOT WOUND Performed at [facility], [address] [place], [place] [address]    Gram Stain   Final    NO SQUAMOUS EPITHELIAL CELLS SEEN FEW WBC SEEN FEW GRAM POSITIVE COCCI Performed at [facility], [place] [address], [place], [place] [address]    Culture   Final    MODERATE ACINETOBACTER CALCOACETICUS/BAUMANNII COMPLEX MODERATE ENTEROBACTER CLOACAE     Report Status 12/11/2020 FINAL  Final   Organism ID, Bacteria ACINETOBACTER CALCOACETICUS/BAUMANNII COMPLEX  Final   Organism ID, Bacteria ENTEROBACTER CLOACAE  Final      Susceptibility   Acinetobacter calcoaceticus/baumannii complex - MIC*    CEFTAZIDIME 8 SENSITIVE Sensitive     CIPROFLOXACIN <=0.25 SENSITIVE Sensitive     GENTAMICIN <=1 SENSITIVE Sensitive     IMIPENEM <=0.25 SENSITIVE Sensitive     PIP/TAZO >=128 RESISTANT Resistant     TRIMETH/SULFA <=20 SENSITIVE Sensitive     AMPICILLIN/SULBACTAM 4 SENSITIVE Sensitive     * MODERATE ACINETOBACTER CALCOACETICUS/BAUMANNII COMPLEX   Enterobacter cloacae - MIC*    CEFAZOLIN >=64 RESISTANT  Resistant     CEFEPIME <=0.12 SENSITIVE Sensitive     CEFTAZIDIME <=1 SENSITIVE Sensitive     CIPROFLOXACIN <=0.25 SENSITIVE Sensitive     GENTAMICIN <=1 SENSITIVE Sensitive     IMIPENEM 0.5 SENSITIVE Sensitive     TRIMETH/SULFA <=20 SENSITIVE Sensitive     PIP/TAZO <=4 SENSITIVE Sensitive     * MODERATE ENTEROBACTER CLOACAE  Resp Panel by RT-PCR (Flu A&B, Covid) Nasopharyngeal Swab     Status: Abnormal   Collection Time: 12/11/20  9:25 PM   Specimen: Nasopharyngeal Swab; Nasopharyngeal(NP) swabs in vial transport medium  Result Value Ref Range Status   SARS Coronavirus 2 by RT PCR POSITIVE (A) NEGATIVE Final    Comment: RESULT CALLED TO, READ BACK BY AND VERIFIED WITH: JULIA SHEEHAN @ 2236 12/11/20 LFD (NOTE) SARS-CoV-2 target nucleic acids are DETECTED.  The SARS-CoV-2 RNA is generally detectable in upper respiratory specimens during the acute phase of infection. Positive results are indicative of the presence of the identified virus, but do not rule out bacterial infection or co-infection with other pathogens not detected by the test. Clinical correlation with patient history and other diagnostic information is necessary to determine patient infection status. The expected result is Negative.  Fact Sheet for  Patients: EntrepreneurPulse.com.au  Fact Sheet for Healthcare Providers: IncredibleEmployment.be  This test is not yet approved or cleared by the [place] FDA and  has been authorized for detection and/or diagnosis of SARS-CoV-2 by FDA under an Emergency Use Authorization (EUA).  This EUA will remain in effect (meaning this test can be  used) for the duration of  the COVID-19 declaration under Section 564(b)(1) of the Act, 21 U.S.C. section 360bbb-3(b)(1), unless the authorization is terminated or revoked sooner.     Influenza A by PCR NEGATIVE NEGATIVE Final   Influenza B by PCR NEGATIVE NEGATIVE Final    Comment: (NOTE) The Xpert Xpress SARS-CoV-2/FLU/RSV plus assay is intended as an aid in the diagnosis of influenza from Nasopharyngeal swab specimens and should not be used as a sole basis for treatment. Nasal washings and aspirates are unacceptable for Xpert Xpress SARS-CoV-2/FLU/RSV testing.  Fact Sheet for Patients: EntrepreneurPulse.com.au  Fact Sheet for Healthcare Providers: IncredibleEmployment.be  This test is not yet approved or cleared by the [place] FDA and has been authorized for detection and/or diagnosis of SARS-CoV-2 by FDA under an Emergency Use Authorization (EUA). This EUA will remain in effect (meaning this test can be used) for the duration of the COVID-19 declaration under Section 564(b)(1) of the Act, 21 U.S.C. section 360bbb-3(b)(1), unless the authorization is terminated or revoked.  Performed at [facility], [place], [place] [address]   Anaerobic culture w Gram Stain     Status: None (Preliminary result)   Collection Time: 12/13/20  7:09 PM   Specimen: PATH Other; Tissue  Result Value Ref Range Status   Specimen Description TISSUE RIGHT FOOT  Final   Special Requests BONE AND SOFT TISSUE  Final   Gram Stain   Final    RARE WBC PRESENT,  PREDOMINANTLY MONONUCLEAR FEW GRAM POSITIVE COCCI IN PAIRS RARE GRAM POSITIVE COCCI IN CLUSTERS Performed at [facility], [place] [address] [place], [place] [address]    Culture PENDING  Incomplete   Report Status PENDING  Incomplete         Radiology Studies: US ARTERIAL ABI (SCREENING LOWER EXTREMITY)  Result Date: 12/12/2020 CLINICAL DATA:  Diabetic foot wound, osteomyelitis EXAM: NONINVASIVE PHYSIOLOGIC VASCULAR STUDY OF  BILATERAL LOWER EXTREMITIES TECHNIQUE: Evaluation of both lower extremities were performed at rest, including calculation of ankle-brachial indices with single level Doppler, pressure and pulse volume recording. COMPARISON:  None. FINDINGS: Right ABI:  1.08 Left ABI:  1.06 Right Lower Extremity:  Normal arterial waveforms at the ankle. Left Lower Extremity:  Normal arterial waveforms at the ankle. 1.0-1.4 Normal IMPRESSION: Normal bilateral resting ankle-brachial indices. Electronically Signed   By: Jacqulynn Cadet M.D.   On: 12/12/2020 19:06   DG Foot 2 Views Right  Result Date: 12/14/2020 CLINICAL DATA:  Status post amputation EXAM: RIGHT FOOT - 2 VIEW COMPARISON:  11/05/2020 FINDINGS: There has been interval amputation through the metatarsals of the first fourth and fifth metatarsals. The second and third metatarsals have been removed. Soft tissue irregularity consistent with the recent surgery is noted. No other focal abnormality is noted. IMPRESSION: Interval amputation as described. Electronically Signed   By: Inez Catalina M.D.   On: 12/14/2020 02:26        Scheduled Meds:  amLODipine  5 mg Oral Daily   Chlorhexidine Gluconate Cloth  6 each Topical Daily   feeding supplement (GLUCERNA SHAKE)  237 mL Oral BID BM   gabapentin  100 mg Oral QHS   heparin  5,000 Units Subcutaneous Q8H   insulin aspart  0-5 Units Subcutaneous QHS   insulin aspart  0-9 Units Subcutaneous TID WC   insulin glargine-yfgn  5 Units Subcutaneous Daily   multivitamin with  minerals  1 tablet Oral Daily   pantoprazole  40 mg Oral Daily   Ensure Max Protein  11 oz Oral QHS   Continuous Infusions:  meropenem (MERREM) IV 1 g (12/14/20 0527)   vancomycin 1,250 mg (12/13/20 2138)    Assessment & Plan:   Principal Problem:   Diabetic foot infection ([place]) Active Problems:   Type 2 diabetes mellitus with peripheral neuropathy (HCC)   Sleep apnea   Anemia   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    Diabetic foot infection/osteomyelitis: Wound cx with Acinetobacter and enterobacter On meropenem and vancomycin  CRP elevated  Status post debridement on 10/3 by Dr. Sherryle Lis  ID following  Wound care packed open with iodoform  After surgery will be strict NWB on the right side  ABI normal bilateral resting ankle-brachial indices    DM II Diabetic educator following  On Semglee 5 units daily will start NovoLog 3 units 3 times daily with meals R-ISS Needs good glycemic control for wound healing     Anemia: H&H stable  Stool guaiac pending  No symptoms of hematochezia, melena, or hemoptysis  Continue PPI  Monitor periodically  Follow-up PCP as outpatient   OSA: On CPAP   Covid virus infection Covid test positive No hypoxia, on room air Not vaccinated Treat if symptomatic/hypoxia Get cxr  DVT prophylaxis: Heparin Code Status: Full Family Communication: None at bedside Disposition Plan:  Status is: Inpatient  Remains inpatient appropriate because:IV treatments appropriate due to intensity of illness or inability to take PO  Dispo: The patient is from: Home              Anticipated d/c is to: Home              Patient currently is not medically stable to  d/c.   Difficult to place patient No             LOS: 3 days   Time spent: 35 minutes with more than 50% on [place], MD [facility] Pager [phone]  If 7PM-7AM, please contact night-coverage 12/14/2020, 8:14 AM

## 2020-12-15 ENCOUNTER — Encounter: Payer: Self-pay | Admitting: Anesthesiology

## 2020-12-15 ENCOUNTER — Encounter: Payer: Medicaid Other | Admitting: Internal Medicine

## 2020-12-15 ENCOUNTER — Ambulatory Visit: Payer: Self-pay | Admitting: Pharmacy Technician

## 2020-12-15 DIAGNOSIS — T148XXA Other injury of unspecified body region, initial encounter: Secondary | ICD-10-CM

## 2020-12-15 DIAGNOSIS — Z79899 Other long term (current) drug therapy: Secondary | ICD-10-CM

## 2020-12-15 LAB — CBC
HCT: 32.2 % — ABNORMAL LOW (ref 39.0–52.0)
Hemoglobin: 9.8 g/dL — ABNORMAL LOW (ref 13.0–17.0)
MCH: 27.6 pg (ref 26.0–34.0)
MCHC: 30.4 g/dL (ref 30.0–36.0)
MCV: 90.7 fL (ref 80.0–100.0)
Platelets: 396 10*3/uL (ref 150–400)
RBC: 3.55 MIL/uL — ABNORMAL LOW (ref 4.22–5.81)
RDW: 13.7 % (ref 11.5–15.5)
WBC: 10.4 10*3/uL (ref 4.0–10.5)
nRBC: 0 % (ref 0.0–0.2)

## 2020-12-15 LAB — GLUCOSE, CAPILLARY
Glucose-Capillary: 249 mg/dL — ABNORMAL HIGH (ref 70–99)
Glucose-Capillary: 220 mg/dL — ABNORMAL HIGH (ref 70–99)
Glucose-Capillary: 231 mg/dL — ABNORMAL HIGH (ref 70–99)
Glucose-Capillary: 245 mg/dL — ABNORMAL HIGH (ref 70–99)
Glucose-Capillary: 258 mg/dL — ABNORMAL HIGH (ref 70–99)

## 2020-12-15 MED ORDER — SODIUM CHLORIDE 0.9% FLUSH: 10.0000 mL | INTRAVENOUS | Status: DC | PRN

## 2020-12-15 MED ORDER — AMLODIPINE BESYLATE 10 MG PO TABS
10.0000 mg | ORAL_TABLET | Freq: Every day | ORAL | Status: DC
  Administered 2020-12-16 – 2020-12-20 (×5): 10 mg via ORAL
  Filled 2020-12-15 (×5): qty 1

## 2020-12-15 MED ORDER — INSULIN GLARGINE-YFGN 100 UNIT/ML ~~LOC~~ SOLN
10.0000 [IU] | Freq: Every day | SUBCUTANEOUS | Status: DC
  Administered 2020-12-16: 10 [IU] via SUBCUTANEOUS
  Filled 2020-12-15: qty 0.1

## 2020-12-15 MED ORDER — INSULIN ASPART 100 UNIT/ML IJ SOLN
6.0000 [IU] | Freq: Three times a day (TID) | INTRAMUSCULAR | Status: DC
  Administered 2020-12-16: 6 [IU] via SUBCUTANEOUS
  Filled 2020-12-15: qty 1

## 2020-12-15 NOTE — Anesthesia Postprocedure Evaluation (Signed)
Anesthesia Post Note  Patient: Fernando Vega  Procedure(s) Performed: IRRIGATION AND DEBRIDEMENT FOOT (Right) AMPUTATION midFOOT-RIGHT (Right: Foot)  Patient location during evaluation: PACU Anesthesia Type: General Level of consciousness: awake and alert Pain management: pain level controlled Vital Signs Assessment: post-procedure vital signs reviewed and stable Respiratory status: spontaneous breathing, nonlabored ventilation, respiratory function stable and patient connected to nasal cannula oxygen Cardiovascular status: blood pressure returned to baseline and stable Postop Assessment: no apparent nausea or vomiting Anesthetic complications: no   No notable events documented.   Last Vitals:  Vitals:   12/14/20 2106 12/15/20 0644  BP: 104/64 138/82  Pulse: 83 80  Resp: 20 16  Temp: 36.6 C (!) 36.3 C  SpO2: 95% 95%    Last Pain:  Vitals:   12/15/20 0644  TempSrc: Oral  PainSc:                  [name]

## 2020-12-15 NOTE — Progress Notes (Signed)
Date of Admission:  12/11/2020   T  ID: Fernando Vega is a 50 y.o. male  Principal Problem:   Diabetic foot infection ([place]) Active Problems:   Type 2 diabetes mellitus with peripheral neuropathy (HCC)   Sleep apnea   Anemia   Osteomyelitis (HCC)   Elevated blood pressure reading    Subjective: Patient feeling better No fever Pain better controlled Some cough  No shortness of breath  Medications:   [START ON 12/16/2020] amLODipine  10 mg Oral Daily   Chlorhexidine Gluconate Cloth  6 each Topical Daily   feeding supplement (GLUCERNA SHAKE)  237 mL Oral BID BM   gabapentin  100 mg Oral QHS   heparin  5,000 Units Subcutaneous Q8H   insulin aspart  0-5 Units Subcutaneous QHS   insulin aspart  0-9 Units Subcutaneous TID WC   [START ON 12/16/2020] insulin aspart  6 Units Subcutaneous TID WC   [START ON 12/16/2020] insulin glargine-yfgn  10 Units Subcutaneous Daily   multivitamin with minerals  1 tablet Oral Daily   pantoprazole  40 mg Oral Daily   Ensure Max Protein  11 oz Oral QHS    Objective: Vital signs in last 24 hours: Temp:  [97.4 F (36.3 C)-97.8 F (36.6 C)] 97.7 F (36.5 C) (10/05 0744) Pulse Rate:  [80-83] 80 (10/05 0744) Resp:  [16-20] 18 (10/05 0744) BP: (104-142)/(64-83) 142/83 (10/05 0744) SpO2:  [95 %] 95 % (10/05 0744)  PHYSICAL EXAM:  General: Alert, cooperative, no distress, appears stated age.  Pale Lungs: Clear to auscultation bilaterally. No Wheezing or Rhonchi. No rales. Heart: Regular rate and rhythm, no murmur, rub or gallop. Abdomen: Soft, non-tender,not distended. Bowel sounds normal. No masses Extremities: Right PICC line    Skin: No rashes or lesions. Or bruising Lymph: Cervical, supraclavicular normal. Neurologic: Grossly non-focal  Lab Results Recent Labs    12/14/20 0530 12/15/20 0630  WBC 13.0* 10.4  HGB 11.0* 9.8*  HCT 33.7* 32.2*  NA 134*  --   K 3.9  --   CL 98  --   CO2 27  --   BUN 12  --   CREATININE 0.74  --     Liver Panel No results for input(s): PROT, ALBUMIN, AST, ALT, ALKPHOS, BILITOT, BILIDIR, IBILI in the last 72 hours. Sedimentation Rate No results for input(s): ESRSEDRATE in the last 72 hours. C-Reactive Protein No results for input(s): CRP in the last 72 hours.  Microbiology:  Studies/Results: DG Chest 2 View  Result Date: 12/14/2020 CLINICAL DATA:  COVID positive EXAM: CHEST - 2 VIEW COMPARISON:  Radiograph 09/01/2019, chest CT 05/06/2018 FINDINGS: Unchanged cardiomediastinal silhouette. There is a right upper extremity PICC with tip overlying the superior cavoatrial junction. There are mild diffuse interstitial opacities. There is a trace right pleural effusion. No visible pneumothorax. No acute osseous abnormality. IMPRESSION: Mild interstitial opacities diffusely, possibly mild edema. Trace right pleural effusion. Electronically Signed   By: Maurine Simmering M.D.   On: 12/14/2020 12:31   DG Foot 2 Views Right  Result Date: 12/14/2020 CLINICAL DATA:  Status post amputation EXAM: RIGHT FOOT - 2 VIEW COMPARISON:  11/05/2020 FINDINGS: There has been interval amputation through the metatarsals of the first fourth and fifth metatarsals. The second and third metatarsals have been removed. Soft tissue irregularity consistent with the recent surgery is noted. No other focal abnormality is noted. IMPRESSION: Interval amputation as described. Electronically Signed   By: Inez Catalina M.D.   On: 12/14/2020 02:26  Assessment/Plan:  Right foot infection.  In August he underwent second and third toe amputation of the right foot.  Culture had MSSA.  He has been on 6 weeks of IV cefazolin but the infection progressed.  Repeat culture as outpatient was Acinetobacter and Enterobacter.  He presented to the ED and underwent TMA on 12/13/2020. Culture from the wound is still pending. Continue meropenem. I called the lab and told them to ID all the organisms.   Patient is going to be taken back for surgery  tomorrow to close the open wound.  SARS-CoV-2 positive.  Minimally symptomatic.   Diabetes mellitus on insulin.  Discussed the management with the patient.

## 2020-12-15 NOTE — Progress Notes (Signed)
Inpatient Diabetes Program Recommendations  AACE/ADA: New Consensus Statement on Inpatient Glycemic Control   Target Ranges:  Prepandial:   less than 140 mg/dL      Peak postprandial:   less than 180 mg/dL (1-2 hours)      Critically ill patients:  140 - 180 mg/dL   Results for Fernando Vega, Fernando Vega" (MRN [id number]) as of 12/15/2020 08:13  Ref. Range 12/14/2020 08:07 12/14/2020 11:37 12/14/2020 16:25 12/14/2020 21:02 12/15/2020 07:41  Glucose-Capillary Latest Ref Range: 70 - 99 mg/dL 200 (H) 314 (H) 255 (H) 271 (H) 249 (H)   Review of Glycemic Control  Diabetes history: DM2 Outpatient Diabetes medications: Basaglar 10 units daily, Humalog 5 units TID with meals, Humalog correction scale Current orders for Inpatient glycemic control: Semglee 5 units daily, Novolog 3 units TID with meals, Novolog 0-9 units TID with meals, Novolog 0-5 units QHS  Inpatient Diabetes Program Recommendations:    Insulin: Please consider increasing Semglee to 8 units daily and meal coverage to Novolog 5 units TID with meals.  Thanks, Barnie Alderman, RN, MSN, CDE Diabetes Coordinator Inpatient Diabetes Program [phone] (Team Pager from 8am to 5pm)

## 2020-12-15 NOTE — Progress Notes (Signed)
Contacted patient.  Patient indicated that he was currently in the hospital.  Didn't feel like talking.  Patient stated that he will contact [facility] once home and reschedule eligibility appointment.  [facility]

## 2020-12-15 NOTE — Progress Notes (Signed)
   PODIATRY PROGRESS NOTE  NAME Fernando Vega MRN [id number] DOB 07-Nov-1970 DOA 12/11/2020    Chief Complaint  Patient presents with   Wound Infection   Patient POD #2 midfoot amputation with irrigation and debridement right foot. DOS: 12/13/2020. Patient feeling well. Denies N/V/F/SOB/CP. Pain well controlled Dressings at bedside do have some strikethrough.   Past Medical History:  Diagnosis Date   Diabetes mellitus without complication ([place])    Neuropathy    Sarcoidosis    Sleep apnea     CBC Latest Ref Rng & Units 12/14/2020 12/12/2020 12/11/2020  WBC 4.0 - 10.5 K/uL 13.0(H) 7.2 8.4  Hemoglobin 13.0 - 17.0 g/dL 11.0(L) 10.0(L) 10.8(L)  Hematocrit 39.0 - 52.0 % 33.7(L) 32.5(L) 34.0(L)  Platelets 150 - 400 K/uL 401(H) 376 392    BMP Latest Ref Rng & Units 12/14/2020 12/12/2020 12/11/2020  Glucose 70 - 99 mg/dL 244(H) 158(H) 215(H)  BUN 6 - 20 mg/dL 12 10 11   Creatinine 0.61 - 1.24 mg/dL 0.74 0.74 0.74  Sodium 135 - 145 mmol/L 134(L) 141 137  Potassium 3.5 - 5.1 mmol/L 3.9 3.7 3.8  Chloride 98 - 111 mmol/L 98 102 100  CO2 22 - 32 mmol/L 27 30 26   Calcium 8.9 - 10.3 mg/dL 8.6(L) 8.7(L) 8.6(L)       Physical Exam: General: The patient is alert and oriented x3 in no acute distress.   S/p midfoot amputation with wet-to-dry dressings applied. Removal of dressings. No active bleeders. No significant purulence or necrotic debris. Erythema appears significantly improved around the rearfoot and ankle. No edema.   ASSESSMENT/PLAN OF CARE S/p right midfoot amputation w/ irrigation and debridement. DOS: 12/13/2020 -Unfortunate due to OR schedule and availability will not be able to take him to the OR today.  Dr. Posey Pronto will hopefully take him during lunch tomorrow - amputation stump and foot in general appears healthy and stable.  - Wet-to-dry dressings reapplied. Keep clean and intact.  -NWB RLE.  PT ordered for today.  I discussed using a rolling knee scooter with him and he thinks  his daughter may be able to get 1 - Will follow.  Lanae Crumbly, DPM 12/15/2020

## 2020-12-15 NOTE — Progress Notes (Signed)
Fernando Vega  MVH:[id number] DOB: 10/26/1970 DOA: 12/11/2020 PCP: Langston Reusing, NP    Brief Narrative:  49 year old with a history of diabetes, sarcoidosis, sleep apnea, and diabetic neuropathy who presented to the ED with complaints of worsening drainage from a known right foot wound.  He was seen in the wound care clinic 12/08/2020 and had been receiving antibiotics via a PICC line.  MRI arranged 12/07/2020 suggested osteomyelitis.  He was hospitalized 8/22 >11/16/2020 and required amputation for necrotizing soft tissue infection of the right foot.  Cultures at that time grew staph aureus.  He was discharged on IV antibiotics with a wound VAC initially.  He remained on antibiotic therapy through 12/12/2020, making a total of 6 weeks.  Date of Positive COVID Test:  12/11/20  COVID-19 specific Treatment: None indicated  Consultants:  Podiatry ID  Code Status: FULL CODE  Antimicrobials:  Meropenem 10/2 > Zosyn 10/1 Vancomycin 10/1 >  DVT prophylaxis: Subcutaneous heparin  Subjective: Resting comfortably in bed.  Reports pain is well controlled.  Denies shortness of breath nausea vomiting sore throat nasal congestion or abdominal pain.  Assessment & Plan:  Diabetic foot infection with osteomyelitis Wound culture revealing Acinetobacter and Enterobacter -antibiotics per ID -debridement 10/3 by Dr. Sherryle Lis -podiatry to take patient back to the OR 10/6  DM2 uncontrolled with hyperglycemia CBG not yet at goal -adjust treatment and follow trend closely  Anemia of chronic disease No evidence of acute blood loss -felt to be due to smoldering long-term infection  OSA Continue usual CPAP regimen  COVID virus infection Incidental finding -no indication for specific treatment presently   Family Communication:  Status is: Inpatient  Remains inpatient appropriate because:Inpatient level of care appropriate due to severity of illness  Dispo: The patient is from: Home               Anticipated d/c is to:  Unclear              Patient currently is not medically stable to d/c.   Difficult to place patient No   Objective: Blood pressure (!) 142/83, pulse 80, temperature 97.7 F (36.5 C), temperature source Oral, resp. rate 18, height 5\' 6"  (1.676 m), weight 84 kg, SpO2 95 %.  Intake/Output Summary (Last 24 hours) at 12/15/2020 1431 Last data filed at 12/15/2020 0300 Gross per 24 hour  Intake 2300 ml  Output 650 ml  Net 1650 ml   Filed Weights   12/11/20 1833  Weight: 84 kg    Examination: General: No acute respiratory distress Lungs: Clear to auscultation bilaterally without wheezes or crackles Cardiovascular: Regular rate and rhythm without murmur gallop or rub normal S1 and S2 Abdomen: Nontender, nondistended, soft, bowel sounds positive, no rebound, no ascites, no appreciable mass Extremities: No significant cyanosis, clubbing, or edema bilateral lower extremities  CBC: Recent Labs  Lab 12/11/20 1835 12/12/20 0550 12/14/20 0530  WBC 8.4 7.2 13.0*  NEUTROABS 5.3  --   --   HGB 10.8* 10.0* 11.0*  HCT 34.0* 32.5* 33.7*  MCV 87.2 89.0 86.9  PLT 392 376 841*   Basic Metabolic Panel: Recent Labs  Lab 12/11/20 1835 12/12/20 0550 12/14/20 0530  NA 137 141 134*  K 3.8 3.7 3.9  CL 100 102 98  CO2 26 30 27   GLUCOSE 215* 158* 244*  BUN 11 10 12   CREATININE 0.74 0.74 0.74  CALCIUM 8.6* 8.7* 8.6*   GFR: Estimated Creatinine Clearance: 109.8 mL/min (by C-G formula based on SCr  of 0.74 mg/dL).  Liver Function Tests: Recent Labs  Lab 12/11/20 1835 12/12/20 0550  AST 17 14*  ALT 8 7  ALKPHOS 78 70  BILITOT 0.8 0.7  PROT 7.7 6.8  ALBUMIN 3.4* 3.1*    Coagulation Profile: Recent Labs  Lab 12/12/20 0550  INR 1.1    HbA1C: Hemoglobin A1C  Date/Time Value Ref Range Status  12/01/2012 01:42 PM 12.3 (H) 4.2 - 6.3 % Final    Comment:    The American Diabetes Association recommends that a primary goal of therapy should be <7% and that  physicians should reevaluate the treatment regimen in patients with HbA1c values consistently >8%.    Hgb A1c MFr Bld  Date/Time Value Ref Range Status  12/11/2020 06:35 PM 8.0 (H) 4.8 - 5.6 % Final    Comment:    (NOTE)         Prediabetes: 5.7 - 6.4         Diabetes: >6.4         Glycemic control for adults with diabetes: <7.0   11/01/2020 03:36 PM 11.8 (H) 4.8 - 5.6 % Final    Comment:    (NOTE) Pre diabetes:          5.7%-6.4%  Diabetes:              >6.4%  Glycemic control for   <7.0% adults with diabetes     CBG: Recent Labs  Lab 12/14/20 1137 12/14/20 1625 12/14/20 2102 12/15/20 0741 12/15/20 1214  GLUCAP 314* 255* 271* 249* 220*    Recent Results (from the past 240 hour(s))  Aerobic Culture w Gram Stain (superficial specimen)     Status: None   Collection Time: 12/07/20 12:14 PM   Specimen: Wound  Result Value Ref Range Status   Specimen Description   Final    WOUND Performed at [facility], [address], [place], [place] [address]    Special Requests   Final    RIGHT FOOT WOUND Performed at [facility], [address] [place], [place] [address]    Gram Stain   Final    NO SQUAMOUS EPITHELIAL CELLS SEEN FEW WBC SEEN FEW GRAM POSITIVE COCCI Performed at [facility], [place] [address], [place], [place] [address]    Culture   Final    MODERATE ACINETOBACTER CALCOACETICUS/BAUMANNII COMPLEX MODERATE ENTEROBACTER CLOACAE    Report Status 12/11/2020 FINAL  Final   Organism ID, Bacteria ACINETOBACTER CALCOACETICUS/BAUMANNII COMPLEX  Final   Organism ID, Bacteria ENTEROBACTER CLOACAE  Final      Susceptibility   Acinetobacter calcoaceticus/baumannii complex - MIC*    CEFTAZIDIME 8 SENSITIVE Sensitive     CIPROFLOXACIN <=0.25 SENSITIVE Sensitive     GENTAMICIN <=1 SENSITIVE Sensitive     IMIPENEM <=0.25 SENSITIVE Sensitive     PIP/TAZO >=128 RESISTANT Resistant     TRIMETH/SULFA <=20 SENSITIVE Sensitive      AMPICILLIN/SULBACTAM 4 SENSITIVE Sensitive     * MODERATE ACINETOBACTER CALCOACETICUS/BAUMANNII COMPLEX   Enterobacter cloacae - MIC*    CEFAZOLIN >=64 RESISTANT Resistant     CEFEPIME <=0.12 SENSITIVE Sensitive     CEFTAZIDIME <=1 SENSITIVE Sensitive     CIPROFLOXACIN <=0.25 SENSITIVE Sensitive     GENTAMICIN <=1 SENSITIVE Sensitive     IMIPENEM 0.5 SENSITIVE Sensitive     TRIMETH/SULFA <=20 SENSITIVE Sensitive     PIP/TAZO <=4 SENSITIVE Sensitive     * MODERATE ENTEROBACTER CLOACAE  Resp Panel by RT-PCR (Flu A&B, Covid)  Nasopharyngeal Swab     Status: Abnormal   Collection Time: 12/11/20  9:25 PM   Specimen: Nasopharyngeal Swab; Nasopharyngeal(NP) swabs in vial transport medium  Result Value Ref Range Status   SARS Coronavirus 2 by RT PCR POSITIVE (A) NEGATIVE Final    Comment: RESULT CALLED TO, READ BACK BY AND VERIFIED WITH: JULIA SHEEHAN @ 2236 12/11/20 LFD (NOTE) SARS-CoV-2 target nucleic acids are DETECTED.  The SARS-CoV-2 RNA is generally detectable in upper respiratory specimens during the acute phase of infection. Positive results are indicative of the presence of the identified virus, but do not rule out bacterial infection or co-infection with other pathogens not detected by the test. Clinical correlation with patient history and other diagnostic information is necessary to determine patient infection status. The expected result is Negative.  Fact Sheet for Patients: EntrepreneurPulse.com.au  Fact Sheet for Healthcare Providers: IncredibleEmployment.be  This test is not yet approved or cleared by the [place] FDA and  has been authorized for detection and/or diagnosis of SARS-CoV-2 by FDA under an Emergency Use Authorization (EUA).  This EUA will remain in effect (meaning this test can be  used) for the duration of  the COVID-19 declaration under Section 564(b)(1) of the Act, 21 U.S.C. section 360bbb-3(b)(1), unless the  authorization is terminated or revoked sooner.     Influenza A by PCR NEGATIVE NEGATIVE Final   Influenza B by PCR NEGATIVE NEGATIVE Final    Comment: (NOTE) The Xpert Xpress SARS-CoV-2/FLU/RSV plus assay is intended as an aid in the diagnosis of influenza from Nasopharyngeal swab specimens and should not be used as a sole basis for treatment. Nasal washings and aspirates are unacceptable for Xpert Xpress SARS-CoV-2/FLU/RSV testing.  Fact Sheet for Patients: EntrepreneurPulse.com.au  Fact Sheet for Healthcare Providers: IncredibleEmployment.be  This test is not yet approved or cleared by the [place] FDA and has been authorized for detection and/or diagnosis of SARS-CoV-2 by FDA under an Emergency Use Authorization (EUA). This EUA will remain in effect (meaning this test can be used) for the duration of the COVID-19 declaration under Section 564(b)(1) of the Act, 21 U.S.C. section 360bbb-3(b)(1), unless the authorization is terminated or revoked.  Performed at [facility], [place], [place] [address]   Anaerobic culture w Gram Stain     Status: None   Collection Time: 12/13/20  7:09 PM   Specimen: PATH Other; Tissue  Result Value Ref Range Status   Specimen Description TISSUE RIGHT FOOT  Final   Special Requests BONE AND SOFT TISSUE  Final   Gram Stain   Final    RARE WBC PRESENT, PREDOMINANTLY MONONUCLEAR FEW GRAM POSITIVE COCCI IN PAIRS RARE GRAM POSITIVE COCCI IN CLUSTERS    Culture   Final    CULTURE CREDITED Performed at [facility], [place] [address], [place], [place] [address]    Report Status 12/15/2020 FINAL  Final  Aerobic/Anaerobic Culture w Gram Stain (surgical/deep wound)     Status: None (Preliminary result)   Collection Time: 12/13/20  9:30 PM   Specimen: Wound; Tissue  Result Value Ref Range Status   Specimen Description   Final    WOUND Performed at [facility] [address], [place], [place] [address]    Special Requests   Final    NONE Performed at [facility], [place], [place] [address]    Gram Stain   Final    RARE WBC PRESENT,BOTH PMN AND MONONUCLEAR FEW  GRAM POSITIVE COCCI IN PAIRS IN CLUSTERS FEW GRAM NEGATIVE RODS Performed at [facility], [place] [address] [place], [place] [address]    Culture   Final    FEW GRAM NEGATIVE RODS CULTURE REINCUBATED FOR BETTER GROWTH SUSCEPTIBILITIES TO FOLLOW NO ANAEROBES ISOLATED; CULTURE IN PROGRESS FOR 5 DAYS    Report Status PENDING  Incomplete     Scheduled Meds:  amLODipine  5 mg Oral Daily   Chlorhexidine Gluconate Cloth  6 each Topical Daily   feeding supplement (GLUCERNA SHAKE)  237 mL Oral BID BM   gabapentin  100 mg Oral QHS   heparin  5,000 Units Subcutaneous Q8H   insulin aspart  0-5 Units Subcutaneous QHS   insulin aspart  0-9 Units Subcutaneous TID WC   insulin aspart  3 Units Subcutaneous TID WC   insulin glargine-yfgn  5 Units Subcutaneous Daily   multivitamin with minerals  1 tablet Oral Daily   pantoprazole  40 mg Oral Daily   Ensure Max Protein  11 oz Oral QHS   Continuous Infusions:  meropenem (MERREM) IV 1 g (12/15/20 0640)     LOS: 4 days   Cherene Altes, MD [facility] Office  [phone] Pager - Text Page per Shea Evans  If 7PM-7AM, please contact night-coverage per Amion 12/15/2020, 2:31 PM

## 2020-12-16 ENCOUNTER — Inpatient Hospital Stay: Payer: Medicaid Other | Admitting: Registered Nurse

## 2020-12-16 ENCOUNTER — Encounter: Payer: Self-pay | Admitting: Podiatry

## 2020-12-16 ENCOUNTER — Encounter
Admission: EM | Disposition: A | Discharge status: Home Health | Payer: Self-pay | Source: Home / Self Care | Attending: Internal Medicine

## 2020-12-16 DIAGNOSIS — E11628 Type 2 diabetes mellitus with other skin complications: Secondary | ICD-10-CM

## 2020-12-16 DIAGNOSIS — L089 Local infection of the skin and subcutaneous tissue, unspecified: Secondary | ICD-10-CM

## 2020-12-16 HISTORY — PX: WOUND DEBRIDEMENT: SHX247

## 2020-12-16 LAB — GLUCOSE, CAPILLARY
Glucose-Capillary: 252 mg/dL — ABNORMAL HIGH (ref 70–99)
Glucose-Capillary: 235 mg/dL — ABNORMAL HIGH (ref 70–99)
Glucose-Capillary: 108 mg/dL — ABNORMAL HIGH (ref 70–99)
Glucose-Capillary: 224 mg/dL — ABNORMAL HIGH (ref 70–99)
Glucose-Capillary: 162 mg/dL — ABNORMAL HIGH (ref 70–99)

## 2020-12-16 LAB — SURGICAL PATHOLOGY

## 2020-12-16 LAB — ANAEROBIC CULTURE W GRAM STAIN

## 2020-12-16 SURGERY — DEBRIDEMENT, WOUND
Anesthesia: Choice | Laterality: Right

## 2020-12-16 SURGERY — DEBRIDEMENT, WOUND
Anesthesia: General | Laterality: Right

## 2020-12-16 MED ORDER — INSULIN GLARGINE-YFGN 100 UNIT/ML ~~LOC~~ SOLN
10.0000 [IU] | Freq: Two times a day (BID) | SUBCUTANEOUS | Status: DC
  Administered 2020-12-16 – 2020-12-17 (×2): 10 [IU] via SUBCUTANEOUS
  Filled 2020-12-16 (×4): qty 0.1

## 2020-12-16 MED ORDER — INSULIN ASPART 100 UNIT/ML IJ SOLN
0.0000 [IU] | Freq: Three times a day (TID) | INTRAMUSCULAR | Status: DC
  Administered 2020-12-16: 5 [IU] via SUBCUTANEOUS
  Administered 2020-12-17: 3 [IU] via SUBCUTANEOUS
  Administered 2020-12-17: 8 [IU] via SUBCUTANEOUS
  Administered 2020-12-19: 2 [IU] via SUBCUTANEOUS
  Administered 2020-12-19 – 2020-12-20 (×2): 3 [IU] via SUBCUTANEOUS
  Filled 2020-12-16 (×7): qty 1

## 2020-12-16 MED ORDER — INSULIN ASPART 100 UNIT/ML IJ SOLN
8.0000 [IU] | Freq: Three times a day (TID) | INTRAMUSCULAR | Status: DC
  Administered 2020-12-16 – 2020-12-17 (×3): 8 [IU] via SUBCUTANEOUS
  Filled 2020-12-16 (×3): qty 1

## 2020-12-16 MED ORDER — PROPOFOL 500 MG/50ML IV EMUL
INTRAVENOUS | Status: DC | PRN
  Administered 2020-12-16: 50 ug/kg/min via INTRAVENOUS

## 2020-12-16 MED ORDER — SODIUM CHLORIDE 0.9 % IV SOLN: INTRAVENOUS | Status: DC | PRN

## 2020-12-16 MED ORDER — VANCOMYCIN HCL 1000 MG IV SOLR
INTRAVENOUS | Status: DC | PRN
  Administered 2020-12-16: 1000 mg

## 2020-12-16 MED ORDER — LIDOCAINE HCL 1 % IJ SOLN
INTRAMUSCULAR | Status: DC | PRN
  Administered 2020-12-16: 10 mL

## 2020-12-16 MED ORDER — PHENYLEPHRINE HCL (PRESSORS) 10 MG/ML IV SOLN
INTRAVENOUS | Status: DC | PRN
  Administered 2020-12-16 (×3): 100 ug via INTRAVENOUS

## 2020-12-16 MED ORDER — FENTANYL CITRATE (PF) 100 MCG/2ML IJ SOLN
INTRAMUSCULAR | Status: AC
  Filled 2020-12-16: qty 2

## 2020-12-16 MED ORDER — PROPOFOL 500 MG/50ML IV EMUL
INTRAVENOUS | Status: AC
  Filled 2020-12-16: qty 50

## 2020-12-16 MED ORDER — MIDAZOLAM HCL 2 MG/2ML IJ SOLN
INTRAMUSCULAR | Status: AC
  Filled 2020-12-16: qty 2

## 2020-12-16 MED ORDER — 0.9 % SODIUM CHLORIDE (POUR BTL) OPTIME
TOPICAL | Status: DC | PRN
  Administered 2020-12-16: 3000 mL

## 2020-12-16 MED ORDER — MIDAZOLAM HCL 2 MG/2ML IJ SOLN
INTRAMUSCULAR | Status: DC | PRN
Start: 2020-12-16 — End: 2020-12-16
  Administered 2020-12-16: 2 mg via INTRAVENOUS

## 2020-12-16 MED ORDER — FENTANYL CITRATE (PF) 100 MCG/2ML IJ SOLN
INTRAMUSCULAR | Status: DC | PRN
  Administered 2020-12-16: 50 ug via INTRAVENOUS

## 2020-12-16 MED ORDER — BUPIVACAINE HCL 0.5 % IJ SOLN
INTRAMUSCULAR | Status: DC | PRN
  Administered 2020-12-16: 10 mL

## 2020-12-16 MED ORDER — INSULIN ASPART 100 UNIT/ML IJ SOLN
0.0000 [IU] | Freq: Every day | INTRAMUSCULAR | Status: DC
  Administered 2020-12-17: 2 [IU] via SUBCUTANEOUS
  Administered 2020-12-18: 3 [IU] via SUBCUTANEOUS
  Administered 2020-12-19: 2 [IU] via SUBCUTANEOUS
  Filled 2020-12-16 (×2): qty 1

## 2020-12-16 SURGICAL SUPPLY — 54 items
BNDG COHESIVE 4X5 TAN ST LF (GAUZE/BANDAGES/DRESSINGS) ×2 IMPLANT
BNDG COHESIVE 6X5 TAN ST LF (GAUZE/BANDAGES/DRESSINGS) ×2 IMPLANT
BNDG CONFORM 2 STRL LF (GAUZE/BANDAGES/DRESSINGS) ×2 IMPLANT
BNDG CONFORM 3 STRL LF (GAUZE/BANDAGES/DRESSINGS) ×2 IMPLANT
BNDG ELASTIC 4X5.8 VLCR STR LF (GAUZE/BANDAGES/DRESSINGS) ×2 IMPLANT
BNDG ESMARK 4X12 TAN STRL LF (GAUZE/BANDAGES/DRESSINGS) ×2 IMPLANT
BNDG GAUZE ELAST 4 BULKY (GAUZE/BANDAGES/DRESSINGS) ×2 IMPLANT
CUFF TOURN SGL QUICK 18X4 (TOURNIQUET CUFF) IMPLANT
CUFF TOURN SGL QUICK 24 (TOURNIQUET CUFF)
CUFF TRNQT CYL 24X4X16.5-23 (TOURNIQUET CUFF) IMPLANT
DRSG ADAPTIC 3X8 NADH LF (GAUZE/BANDAGES/DRESSINGS) ×2 IMPLANT
DURAPREP 26ML APPLICATOR (WOUND CARE) ×2 IMPLANT
ELECT REM PT RETURN 9FT ADLT (ELECTROSURGICAL) ×2
ELECTRODE REM PT RTRN 9FT ADLT (ELECTROSURGICAL) ×1 IMPLANT
GAUZE 4X4 16PLY ~~LOC~~+RFID DBL (SPONGE) ×2 IMPLANT
GAUZE PACKING 1/4 X5 YD (GAUZE/BANDAGES/DRESSINGS) IMPLANT
GAUZE PACKING IODOFORM 1X5 (PACKING) IMPLANT
GAUZE SPONGE 4X4 12PLY STRL (GAUZE/BANDAGES/DRESSINGS) ×2 IMPLANT
GLOVE SRG 8 PF TXTR STRL LF DI (GLOVE) ×1 IMPLANT
GLOVE SURG ENC TEXT LTX SZ8 (GLOVE) ×2 IMPLANT
GLOVE SURG UNDER POLY LF SZ8 (GLOVE) ×1
GOWN STRL REUS W/ TWL XL LVL3 (GOWN DISPOSABLE) ×1 IMPLANT
GOWN STRL REUS W/TWL MED LVL3 (GOWN DISPOSABLE) ×2 IMPLANT
GOWN STRL REUS W/TWL XL LVL3 (GOWN DISPOSABLE) ×1
HANDPIECE VERSAJET DEBRIDEMENT (MISCELLANEOUS) IMPLANT
IV NS 1000ML (IV SOLUTION) ×1
IV NS 1000ML BAXH (IV SOLUTION) ×1 IMPLANT
IV NS IRRIG 3000ML ARTHROMATIC (IV SOLUTION) IMPLANT
KIT TURNOVER KIT A (KITS) ×2 IMPLANT
LABEL OR SOLS (LABEL) ×2 IMPLANT
MANIFOLD NEPTUNE II (INSTRUMENTS) ×2 IMPLANT
NEEDLE FILTER BLUNT 18X 1/2SAF (NEEDLE) ×1
NEEDLE FILTER BLUNT 18X1 1/2 (NEEDLE) ×1 IMPLANT
NEEDLE HYPO 25X1 1.5 SAFETY (NEEDLE) ×2 IMPLANT
NS IRRIG 500ML POUR BTL (IV SOLUTION) ×2 IMPLANT
PACK EXTREMITY ARMC (MISCELLANEOUS) ×2 IMPLANT
PAD ABD DERMACEA PRESS 5X9 (GAUZE/BANDAGES/DRESSINGS) ×2 IMPLANT
PULSAVAC PLUS IRRIG FAN TIP (DISPOSABLE)
SOL PREP PVP 2OZ (MISCELLANEOUS) ×2
SOLUTION PREP PVP 2OZ (MISCELLANEOUS) ×1 IMPLANT
STAPLER SKIN PROX 35W (STAPLE) IMPLANT
STOCKINETTE IMPERVIOUS 9X36 MD (GAUZE/BANDAGES/DRESSINGS) ×2 IMPLANT
SUT ETHILON 2 0 FS 18 (SUTURE) IMPLANT
SUT ETHILON 4-0 (SUTURE)
SUT ETHILON 4-0 FS2 18XMFL BLK (SUTURE)
SUT PROLENE 3 0 PS 2 (SUTURE) IMPLANT
SUT VIC AB 3-0 SH 27 (SUTURE)
SUT VIC AB 3-0 SH 27X BRD (SUTURE) IMPLANT
SUT VIC AB 4-0 FS2 27 (SUTURE) IMPLANT
SUTURE ETHLN 4-0 FS2 18XMF BLK (SUTURE) IMPLANT
SWAB CULTURE AMIES ANAERIB BLU (MISCELLANEOUS) IMPLANT
SYR 10ML LL (SYRINGE) ×4 IMPLANT
TIP FAN IRRIG PULSAVAC PLUS (DISPOSABLE) IMPLANT
WATER STERILE IRR 500ML POUR (IV SOLUTION) ×2 IMPLANT

## 2020-12-16 SURGICAL SUPPLY — 60 items
BNDG COHESIVE 4X5 TAN ST LF (GAUZE/BANDAGES/DRESSINGS) ×2 IMPLANT
BNDG COHESIVE 6X5 TAN ST LF (GAUZE/BANDAGES/DRESSINGS) ×2 IMPLANT
BNDG CONFORM 2 STRL LF (GAUZE/BANDAGES/DRESSINGS) IMPLANT
BNDG CONFORM 3 STRL LF (GAUZE/BANDAGES/DRESSINGS) IMPLANT
BNDG ELASTIC 4X5.8 VLCR STR LF (GAUZE/BANDAGES/DRESSINGS) ×2 IMPLANT
BNDG ESMARK 4X12 TAN STRL LF (GAUZE/BANDAGES/DRESSINGS) ×2 IMPLANT
BNDG GAUZE ELAST 4 BULKY (GAUZE/BANDAGES/DRESSINGS) IMPLANT
CUFF TOURN SGL QUICK 18X4 (TOURNIQUET CUFF) IMPLANT
CUFF TOURN SGL QUICK 24 (TOURNIQUET CUFF)
CUFF TRNQT CYL 24X4X16.5-23 (TOURNIQUET CUFF) IMPLANT
DRSG ADAPTIC 3X8 NADH LF (GAUZE/BANDAGES/DRESSINGS) IMPLANT
DURAPREP 26ML APPLICATOR (WOUND CARE) IMPLANT
ELECT REM PT RETURN 9FT ADLT (ELECTROSURGICAL) ×2
ELECTRODE REM PT RTRN 9FT ADLT (ELECTROSURGICAL) ×1 IMPLANT
GAUZE 4X4 16PLY ~~LOC~~+RFID DBL (SPONGE) ×2 IMPLANT
GAUZE PACKING 1/4 X5 YD (GAUZE/BANDAGES/DRESSINGS) IMPLANT
GAUZE PACKING IODOFORM 1X5 (PACKING) IMPLANT
GAUZE SPONGE 4X4 12PLY STRL (GAUZE/BANDAGES/DRESSINGS) ×2 IMPLANT
GLOVE SRG 8 PF TXTR STRL LF DI (GLOVE) ×1 IMPLANT
GLOVE SURG ENC TEXT LTX SZ8 (GLOVE) ×8 IMPLANT
GLOVE SURG UNDER POLY LF SZ8 (GLOVE) ×1
GOWN STRL REUS W/ TWL XL LVL3 (GOWN DISPOSABLE) ×1 IMPLANT
GOWN STRL REUS W/TWL MED LVL3 (GOWN DISPOSABLE) ×2 IMPLANT
GOWN STRL REUS W/TWL XL LVL3 (GOWN DISPOSABLE) ×1
HANDPIECE VERSAJET DEBRIDEMENT (MISCELLANEOUS) IMPLANT
IV NS 1000ML (IV SOLUTION) ×1
IV NS 1000ML BAXH (IV SOLUTION) ×1 IMPLANT
IV NS IRRIG 3000ML ARTHROMATIC (IV SOLUTION) IMPLANT
KIT TURNOVER KIT A (KITS) ×2 IMPLANT
LABEL OR SOLS (LABEL) ×2 IMPLANT
MANIFOLD NEPTUNE II (INSTRUMENTS) ×2 IMPLANT
MATRIX PURAPLY MZ 1000MG (Tissue) ×1 IMPLANT
NEEDLE FILTER BLUNT 18X 1/2SAF (NEEDLE) ×1
NEEDLE FILTER BLUNT 18X1 1/2 (NEEDLE) ×1 IMPLANT
NEEDLE HYPO 25X1 1.5 SAFETY (NEEDLE) ×2 IMPLANT
NS IRRIG 500ML POUR BTL (IV SOLUTION) ×2 IMPLANT
PACK EXTREMITY ARMC (MISCELLANEOUS) ×2 IMPLANT
PAD ABD DERMACEA PRESS 5X9 (GAUZE/BANDAGES/DRESSINGS) ×2 IMPLANT
PAD CAST CTTN 4X4 STRL (SOFTGOODS) ×2 IMPLANT
PADDING CAST COTTON 4X4 STRL (SOFTGOODS) ×2
PULSAVAC PLUS IRRIG FAN TIP (DISPOSABLE) ×2
PURAPLY MZ 1000MG (Tissue) ×2 IMPLANT
SOL PREP PVP 2OZ (MISCELLANEOUS) ×4
SOLUTION PREP PVP 2OZ (MISCELLANEOUS) ×2 IMPLANT
SPLINT CAST 1 STEP 4X30 (MISCELLANEOUS) ×2 IMPLANT
STAPLER SKIN PROX 35W (STAPLE) IMPLANT
STOCKINETTE IMPERVIOUS 9X36 MD (GAUZE/BANDAGES/DRESSINGS) ×2 IMPLANT
SUT ETHILON 2 0 FS 18 (SUTURE) IMPLANT
SUT ETHILON 4-0 (SUTURE)
SUT ETHILON 4-0 FS2 18XMFL BLK (SUTURE)
SUT PROLENE 3 0 FS 2 (SUTURE) ×6 IMPLANT
SUT PROLENE 3 0 PS 2 (SUTURE) ×6 IMPLANT
SUT VIC AB 3-0 SH 27 (SUTURE)
SUT VIC AB 3-0 SH 27X BRD (SUTURE) IMPLANT
SUT VIC AB 4-0 FS2 27 (SUTURE) IMPLANT
SUTURE ETHLN 4-0 FS2 18XMF BLK (SUTURE) IMPLANT
SWAB CULTURE AMIES ANAERIB BLU (MISCELLANEOUS) IMPLANT
SYR 10ML LL (SYRINGE) ×4 IMPLANT
TIP FAN IRRIG PULSAVAC PLUS (DISPOSABLE) ×1 IMPLANT
WATER STERILE IRR 500ML POUR (IV SOLUTION) ×2 IMPLANT

## 2020-12-16 NOTE — Transfer of Care (Signed)
Immediate Anesthesia Transfer of Care Note  Patient: Fernando Vega  Procedure(s) Performed: DEBRIDEMENT WOUND (Right)  Patient Location: PACU and Nursing Unit  Anesthesia Type:General  Level of Consciousness: awake, alert , oriented and patient cooperative  Airway & Oxygen Therapy: Patient Spontanous Breathing  Post-op Assessment: Report given to RN and Post -op Vital signs reviewed and stable  Post vital signs: Reviewed and stable  Last Vitals:  Vitals Value Taken Time  BP 120/75 12/16/20 1228  Temp 36.3 C 12/16/20 1228  Pulse 85 12/16/20 1228  Resp 15 12/16/20 1228  SpO2 97 % 12/16/20 1228    Last Pain:  Vitals:   12/16/20 0853  TempSrc:   PainSc: 4       Patients Stated Pain Goal: 3 (84/21/03 1281)  Complications: No notable events documented.

## 2020-12-16 NOTE — Anesthesia Postprocedure Evaluation (Signed)
Anesthesia Post Note  Patient: Fernando Vega  Procedure(s) Performed: DEBRIDEMENT WOUND (Right)  Patient location during evaluation: Nursing Unit Anesthesia Type: General Level of consciousness: awake and alert Pain management: pain level controlled Vital Signs Assessment: post-procedure vital signs reviewed and stable Respiratory status: spontaneous breathing, nonlabored ventilation and respiratory function stable Cardiovascular status: blood pressure returned to baseline and stable Postop Assessment: no apparent nausea or vomiting Anesthetic complications: no   No notable events documented.   Last Vitals:  Vitals:   12/16/20 0831 12/16/20 1228  BP: (!) 144/82 120/75  Pulse: 88 85  Resp: 15 15  Temp: 36.6 C (!) 36.3 C  SpO2: 97% 97%    Last Pain:  Vitals:   12/16/20 0853  TempSrc:   PainSc: [place]

## 2020-12-16 NOTE — Anesthesia Preprocedure Evaluation (Addendum)
Anesthesia Evaluation  Patient identified by MRN, date of birth, ID band Patient awake    Reviewed: Allergy & Precautions, NPO status , Patient's Chart, lab work & pertinent test results  History of Anesthesia Complications Negative for: history of anesthetic complications  Airway Mallampati: II  TM Distance: >3 FB Neck ROM: Full    Dental  (+) Edentulous Upper, Edentulous Lower, Dental Advidsory Given   Pulmonary neg shortness of breath, sleep apnea (noncompliant with CPAP) , neg COPD, Recent URI  (Covid +),    breath sounds clear to auscultation- rhonchi (-) wheezing      Cardiovascular (-) hypertension(-) angina(-) CAD, (-) Past MI, (-) Cardiac Stents and (-) CABG negative cardio ROS   Rhythm:Regular Rate:Normal - Systolic murmurs and - Diastolic murmurs    Neuro/Psych neg Seizures  Neuromuscular disease negative psych ROS   GI/Hepatic negative GI ROS, Neg liver ROS,   Endo/Other  diabetes, Poorly Controlled, Type 2, Insulin Dependent  Renal/GU negative Renal ROS     Musculoskeletal negative musculoskeletal ROS (+)   Abdominal (+) - obese,   Peds  Hematology  (+) Blood dyscrasia, anemia ,   Anesthesia Other Findings 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  Past Medical History: No date: Diabetes mellitus without complication (HCC) No date: Neuropathy No date: Sarcoidosis No date: Sleep apnea   Reproductive/Obstetrics                            Anesthesia Physical  Anesthesia Plan  ASA: 3  Anesthesia Plan: General   Post-op Pain Management:    Induction: Intravenous  PONV Risk Score and Plan: 2 and Propofol infusion, TIVA, Midazolam, Ondansetron and Dexamethasone  Airway Management Planned: Natural Airway  Additional  Equipment: None  Intra-op Plan:   Post-operative Plan:   Informed Consent:   Plan Discussed with: CRNA and Surgeon  Anesthesia Plan Comments: (Discussed risks of anesthesia with patient, including possibility of difficulty with spontaneous ventilation under anesthesia necessitating airway intervention, PONV, and rare risks such as cardiac or respiratory or neurological events, and allergic reactions. Patient understands.)        Anesthesia Quick Evaluation

## 2020-12-16 NOTE — Progress Notes (Signed)
ID Pt underwent delayed primary closure of the rt TMA today Doing well Pain currently under control  O/e awake and alert Patient Vitals for the past 24 hrs:  BP Temp Temp src Pulse Resp SpO2  12/16/20 1500 126/73 97.8 F (36.6 C) -- 99 15 99 %  12/16/20 1228 120/75 (!) 97.4 F (36.3 C) -- 85 15 97 %  12/16/20 0831 (!) 144/82 97.8 F (36.6 C) -- 88 15 97 %  12/16/20 0500 130/86 (!) 97.4 F (36.3 C) Oral 91 20 94 %  12/15/20 2119 130/79 98 F (36.7 C) -- 90 20 98 %   Chest b/l air entry [id number] Abd soft Rt leg surgical dressing CNS non focal  Labs CBC Latest Ref Rng & Units 12/15/2020 12/14/2020 12/12/2020  WBC 4.0 - 10.5 K/uL 10.4 13.0(H) 7.2  Hemoglobin 13.0 - 17.0 g/dL 9.8(L) 11.0(L) 10.0(L)  Hematocrit 39.0 - 52.0 % 32.2(L) 33.7(L) 32.5(L)  Platelets 150 - 400 K/uL 396 401(H) 376     CMP Latest Ref Rng & Units 12/14/2020 12/12/2020 12/11/2020  Glucose 70 - 99 mg/dL 244(H) 158(H) 215(H)  BUN 6 - 20 mg/dL 12 10 11   Creatinine 0.61 - 1.24 mg/dL 0.74 0.74 0.74  Sodium 135 - 145 mmol/L 134(L) 141 137  Potassium 3.5 - 5.1 mmol/L 3.9 3.7 3.8  Chloride 98 - 111 mmol/L 98 102 100  CO2 22 - 32 mmol/L 27 30 26   Calcium 8.9 - 10.3 mg/dL 8.6(L) 8.7(L) 8.6(L)  Total Protein 6.5 - 8.1 g/dL - 6.8 7.7  Total Bilirubin 0.3 - 1.2 mg/dL - 0.7 0.8  Alkaline Phos 38 - 126 U/L - 70 78  AST 15 - 41 U/L - 14(L) 17  ALT 0 - 44 U/L - 7 8     Micro Tissue culture - acinetobacter and enterobacter  Impression/Recommendation Right foot infection.  In August he underwent second and third toe amputation of the right foot.  Culture had MSSA.  He was on 6 weeks of IV cefazolin for  MSSA but the surgical wound did not close up.  Repeat culture as outpatient was Acinetobacter and Enterobacter.  He presented to the ED and underwent TMA on 12/13/2020. Culture from the wound also enterobacter and acinetobacter. On meropenem. He underwent delayed primary closure of the rt TMA site IF he has an oral  quinolone option he would be switched to quinolone for 2-4 weeks depending on wound progression and bone pathology  SARS-CoV-2 positive. asymptomatic.    Diabetes mellitus on insulin.    Discussed the management with the patient Dr.Snider covering the next three days by phone- call f needed

## 2020-12-16 NOTE — Op Note (Signed)
Surgeon: Surgeon(s): Felipa Furnace, DPM  Assistants: None Pre-operative diagnosis: diabetic foot infection  Post-operative diagnosis: same Procedure: 1.  Delayed primary closure Pathology: * No specimens in log *  Pertinent Intra-op findings: The wound appears clear of infection.  Granular wound bed noted.* Anesthesia: Choice  Hemostasis: None EBL: 100 cc Materials: 3-0 Prolene and PuraPly AM micro matrix Injectables: 20 cc of one-to-one mixture of half percent Marcaine plain and 1% lidocaine plain 20 cc Complications: None  Indications for surgery: A 50 y.o. male presents with right foot infection status post transmetatarsal amputation packed open. Patient has failed all conservative therapy including but not limited to local wound care IV antibiotics.  He wishes to have surgical correction of the foot/deformity. It was determined that patient would benefit from right delayed primary closure. Informed surgical risk consent was reviewed and read aloud to the patient.  I reviewed the films.  I have discussed my findings with the patient in great detail.  I have discussed all risks including but not limited to infection, stiffness, scarring, limp, disability, deformity, damage to blood vessels and nerves, numbness, poor healing, need for braces, arthritis, chronic pain, amputation, death.  All benefits and realistic expectations discussed in great detail.  I have made no promises as to the outcome.  I have provided realistic expectations.  I have offered the patient a 2nd opinion, which they have declined and assured me they preferred to proceed despite the risks   Procedure in detail: The patient was both verbally and visually identified by myself, the nursing staff, and anesthesia staff in the preoperative holding area. They were then transferred to the operating room and placed on the operative table in supine position.  Attention was directed to the right open transmetatarsal amputation,  using rongeur small amount of necrotic tissue was aggressively debrided down to healthy bleeding tissue.  Overall the wound appeared very granular and with adequate bleeding therefore procedure was made to primary close the wound.  Using normal saline solution the wound was thoroughly irrigated with pulse lavage in standard technique.  No further purulent drainage was noted.  At this time the wound was ready to be primarily closed.  Using 3-0 Prolene the wound was completely closed in simple interrupted suture technique.  The wound was dressed with Betadine wet-to-dry dressing.  He will be nonweightbearing to the right with posterior splint.  All bony prominences were adequately padded.  At the conclusion of the procedure the patient was awoken from anesthesia and found to have tolerated the procedure well any complications. There were transferred to PACU with vital signs stable and vascular status intact.  Boneta Lucks, DPM

## 2020-12-16 NOTE — Progress Notes (Signed)
PT Cancellation Note  Patient Details Name: Fernando Vega MRN: [id number] DOB: 1970-07-26   Cancelled Treatment:    Reason Eval/Treat Not Completed: Patient at procedure or test/unavailable Pt to reassess as able.  [employer], SPT

## 2020-12-16 NOTE — Plan of Care (Signed)

## 2020-12-16 NOTE — Progress Notes (Signed)
Fernando Vega  ZOX:[id number] DOB: 11-Jun-1970 DOA: 12/11/2020 PCP: Langston Reusing, NP    Brief Narrative:  50yo with a history of diabetes, sarcoidosis, sleep apnea, and diabetic neuropathy who presented to the ED with complaints of worsening drainage from a known right foot wound.  He was seen in the wound care clinic 12/08/2020 and had been receiving antibiotics via a PICC line.  MRI arranged 12/07/2020 suggested osteomyelitis.  He was hospitalized 8/22 >11/16/2020 and required amputation of 2 toes for necrotizing soft tissue infection of the right foot.  Cultures at that time grew staph aureus.  He was discharged on IV antibiotics with a wound VAC initially.  He remained on antibiotic therapy through 12/12/2020, making a total of 6 weeks.  Date of Positive COVID Test:  12/11/20 (last day of inpatient isolation 12/21/20)  COVID-19 specific Treatment: None indicated  Consultants:  Podiatry ID  Code Status: FULL CODE  Antimicrobials:  Meropenem 10/2 > Zosyn 10/1 Vancomycin 10/1 >  DVT prophylaxis: Subcutaneous heparin  Subjective: Afebrile.  Vital signs stable.  CBG not yet at goal.  Resting comfortably in bed having returned from the OR.  Denies chest pain shortness of breath fever chills nausea or vomiting.  Reports pain in foot is well controlled.  Assessment & Plan:  Diabetic foot infection with osteomyelitis Wound culture revealing Acinetobacter and Enterobacter -antibiotics per ID -debridement w/ TMA 10/3 by Dr. Sherryle Lis -successful closure of wound previously left open in OR 10/6 -cleared for discharge from a surgical standpoint at this time -nonweightbearing to right lower extremity -posterior splint to remain in place -dressing to remain in place until followed up by podiatry -follow-up with Dr. Sherryle Lis in outpatient setting 1 week from discharge  DM2 uncontrolled with hyperglycemia CBG not yet at goal -adjust treatment again today and follow trend closely  Anemia of  chronic disease No evidence of acute blood loss -felt to be due to smoldering long-term infection  OSA Continue usual CPAP regimen  COVID virus infection Incidental finding -no indication for specific treatment presently   Family Communication:  Status is: Inpatient  Remains inpatient appropriate because:Inpatient level of care appropriate due to severity of illness  Dispo: The patient is from: Home              Anticipated d/c is to:  Unclear              Patient currently is not medically stable to d/c.   Difficult to place patient No   Objective: Blood pressure (!) 144/82, pulse 88, temperature 97.8 F (36.6 C), resp. rate 15, height 5\' 6"  (1.676 m), weight 84 kg, SpO2 97 %.  Intake/Output Summary (Last 24 hours) at 12/16/2020 1013 Last data filed at 12/16/2020 0841 Gross per 24 hour  Intake --  Output 2200 ml  Net -2200 ml    Filed Weights   12/11/20 1833  Weight: 84 kg    Examination: General: No acute respiratory distress Lungs: Clear to auscultation bilaterally without wheezes or crackles Cardiovascular: RRR without murmur Abdomen: Nontender, nondistended, soft, bowel sounds positive, no rebound Extremities: No significant cyanosis, clubbing, or edema bilateral lower extremities  CBC: Recent Labs  Lab 12/11/20 1835 12/12/20 0550 12/14/20 0530 12/15/20 0630  WBC 8.4 7.2 13.0* 10.4  NEUTROABS 5.3  --   --   --   HGB 10.8* 10.0* 11.0* 9.8*  HCT 34.0* 32.5* 33.7* 32.2*  MCV 87.2 89.0 86.9 90.7  PLT 392 376 401* 396    Basic  Metabolic Panel: Recent Labs  Lab 12/11/20 1835 12/12/20 0550 12/14/20 0530  NA 137 141 134*  K 3.8 3.7 3.9  CL 100 102 98  CO2 26 30 27   GLUCOSE 215* 158* 244*  BUN 11 10 12   CREATININE 0.74 0.74 0.74  CALCIUM 8.6* 8.7* 8.6*    GFR: Estimated Creatinine Clearance: 109.8 mL/min (by C-G formula based on SCr of 0.74 mg/dL).  Liver Function Tests: Recent Labs  Lab 12/11/20 1835 12/12/20 0550  AST 17 14*  ALT 8 7   ALKPHOS 78 70  BILITOT 0.8 0.7  PROT 7.7 6.8  ALBUMIN 3.4* 3.1*     Coagulation Profile: Recent Labs  Lab 12/12/20 0550  INR 1.1     HbA1C: Hemoglobin A1C  Date/Time Value Ref Range Status  12/01/2012 01:42 PM 12.3 (H) 4.2 - 6.3 % Final    Comment:    The American Diabetes Association recommends that a primary goal of therapy should be <7% and that physicians should reevaluate the treatment regimen in patients with HbA1c values consistently >8%.    Hgb A1c MFr Bld  Date/Time Value Ref Range Status  12/11/2020 06:35 PM 8.0 (H) 4.8 - 5.6 % Final    Comment:    (NOTE)         Prediabetes: 5.7 - 6.4         Diabetes: >6.4         Glycemic control for adults with diabetes: <7.0   11/01/2020 03:36 PM 11.8 (H) 4.8 - 5.6 % Final    Comment:    (NOTE) Pre diabetes:          5.7%-6.4%  Diabetes:              >6.4%  Glycemic control for   <7.0% adults with diabetes     CBG: Recent Labs  Lab 12/15/20 1600 12/15/20 2116 12/15/20 2121 12/16/20 0802 12/16/20 0833  GLUCAP 231* 258* 245* 252* 235*     Recent Results (from the past 240 hour(s))  Aerobic Culture w Gram Stain (superficial specimen)     Status: None   Collection Time: 12/07/20 12:14 PM   Specimen: Wound  Result Value Ref Range Status   Specimen Description   Final    WOUND Performed at [facility], [address] [place], [place] [address]    Special Requests   Final    RIGHT FOOT WOUND Performed at [facility], [address] [place], [place] [address]    Gram Stain   Final    NO SQUAMOUS EPITHELIAL CELLS SEEN FEW WBC SEEN FEW GRAM POSITIVE COCCI Performed at [facility], [place] [address] [place], [place] [address]    Culture   Final    MODERATE ACINETOBACTER CALCOACETICUS/BAUMANNII COMPLEX MODERATE ENTEROBACTER CLOACAE    Report Status 12/11/2020 FINAL  Final   Organism ID, Bacteria ACINETOBACTER CALCOACETICUS/BAUMANNII COMPLEX  Final   Organism ID,  Bacteria ENTEROBACTER CLOACAE  Final      Susceptibility   Acinetobacter calcoaceticus/baumannii complex - MIC*    CEFTAZIDIME 8 SENSITIVE Sensitive     CIPROFLOXACIN <=0.25 SENSITIVE Sensitive     GENTAMICIN <=1 SENSITIVE Sensitive     IMIPENEM <=0.25 SENSITIVE Sensitive     PIP/TAZO >=128 RESISTANT Resistant     TRIMETH/SULFA <=20 SENSITIVE Sensitive     AMPICILLIN/SULBACTAM 4 SENSITIVE Sensitive     * MODERATE ACINETOBACTER CALCOACETICUS/BAUMANNII COMPLEX   Enterobacter cloacae - MIC*    CEFAZOLIN >=64 RESISTANT Resistant  CEFEPIME <=0.12 SENSITIVE Sensitive     CEFTAZIDIME <=1 SENSITIVE Sensitive     CIPROFLOXACIN <=0.25 SENSITIVE Sensitive     GENTAMICIN <=1 SENSITIVE Sensitive     IMIPENEM 0.5 SENSITIVE Sensitive     TRIMETH/SULFA <=20 SENSITIVE Sensitive     PIP/TAZO <=4 SENSITIVE Sensitive     * MODERATE ENTEROBACTER CLOACAE  Resp Panel by RT-PCR (Flu A&B, Covid) Nasopharyngeal Swab     Status: Abnormal   Collection Time: 12/11/20  9:25 PM   Specimen: Nasopharyngeal Swab; Nasopharyngeal(NP) swabs in vial transport medium  Result Value Ref Range Status   SARS Coronavirus 2 by RT PCR POSITIVE (A) NEGATIVE Final    Comment: RESULT CALLED TO, READ BACK BY AND VERIFIED WITH: JULIA SHEEHAN @ 2236 12/11/20 LFD (NOTE) SARS-CoV-2 target nucleic acids are DETECTED.  The SARS-CoV-2 RNA is generally detectable in upper respiratory specimens during the acute phase of infection. Positive results are indicative of the presence of the identified virus, but do not rule out bacterial infection or co-infection with other pathogens not detected by the test. Clinical correlation with patient history and other diagnostic information is necessary to determine patient infection status. The expected result is Negative.  Fact Sheet for Patients: EntrepreneurPulse.com.au  Fact Sheet for Healthcare Providers: IncredibleEmployment.be  This test is not yet  approved or cleared by the [place] FDA and  has been authorized for detection and/or diagnosis of SARS-CoV-2 by FDA under an Emergency Use Authorization (EUA).  This EUA will remain in effect (meaning this test can be  used) for the duration of  the COVID-19 declaration under Section 564(b)(1) of the Act, 21 U.S.C. section 360bbb-3(b)(1), unless the authorization is terminated or revoked sooner.     Influenza A by PCR NEGATIVE NEGATIVE Final   Influenza B by PCR NEGATIVE NEGATIVE Final    Comment: (NOTE) The Xpert Xpress SARS-CoV-2/FLU/RSV plus assay is intended as an aid in the diagnosis of influenza from Nasopharyngeal swab specimens and should not be used as a sole basis for treatment. Nasal washings and aspirates are unacceptable for Xpert Xpress SARS-CoV-2/FLU/RSV testing.  Fact Sheet for Patients: EntrepreneurPulse.com.au  Fact Sheet for Healthcare Providers: IncredibleEmployment.be  This test is not yet approved or cleared by the [place] FDA and has been authorized for detection and/or diagnosis of SARS-CoV-2 by FDA under an Emergency Use Authorization (EUA). This EUA will remain in effect (meaning this test can be used) for the duration of the COVID-19 declaration under Section 564(b)(1) of the Act, 21 U.S.C. section 360bbb-3(b)(1), unless the authorization is terminated or revoked.  Performed at [facility], [place], [place] [address]   Anaerobic culture w Gram Stain     Status: None   Collection Time: 12/13/20  7:09 PM   Specimen: PATH Other; Tissue  Result Value Ref Range Status   Specimen Description TISSUE RIGHT FOOT  Final   Special Requests BONE AND SOFT TISSUE  Final   Gram Stain   Final    RARE WBC PRESENT, PREDOMINANTLY MONONUCLEAR FEW GRAM POSITIVE COCCI IN PAIRS RARE GRAM POSITIVE COCCI IN CLUSTERS    Culture   Final    CULTURE CREDITED SEE ACCN [id number] ORDERED  INCORRECTLY Performed at [facility], [place] [address], [place], [place] [address]    Report Status 12/15/2020 FINAL  Final  Aerobic/Anaerobic Culture w Gram Stain (surgical/deep wound)     Status: None (Preliminary result)   Collection Time: 12/13/20  9:30 PM  Specimen: Bone; Tissue  Result Value Ref Range Status   Specimen Description BONE AND SIFT TISSUE  Final   Special Requests TISSUE RIGHT FOOT  Final   Gram Stain   Final    RARE WBC PRESENT,BOTH PMN AND MONONUCLEAR FEW GRAM POSITIVE COCCI IN PAIRS IN CLUSTERS FEW GRAM NEGATIVE RODS Performed at [facility], [place] [address], [place], [place] [address]    Culture   Final    FEW GRAM NEGATIVE RODS CULTURE REINCUBATED FOR BETTER GROWTH SUSCEPTIBILITIES TO FOLLOW NO ANAEROBES ISOLATED; CULTURE IN PROGRESS FOR 5 DAYS    Report Status PENDING  Incomplete      Scheduled Meds:  amLODipine  10 mg Oral Daily   Chlorhexidine Gluconate Cloth  6 each Topical Daily   feeding supplement (GLUCERNA SHAKE)  237 mL Oral BID BM   gabapentin  100 mg Oral QHS   heparin  5,000 Units Subcutaneous Q8H   insulin aspart  0-5 Units Subcutaneous QHS   insulin aspart  0-9 Units Subcutaneous TID WC   insulin aspart  6 Units Subcutaneous TID WC   insulin glargine-yfgn  10 Units Subcutaneous Daily   multivitamin with minerals  1 tablet Oral Daily   pantoprazole  40 mg Oral Daily   Ensure Max Protein  11 oz Oral QHS   Continuous Infusions:  meropenem (MERREM) IV 1 g (12/16/20 0548)     LOS: 5 days   Cherene Altes, MD [facility] Office  [phone] Pager - Text Page per Amion  If 7PM-7AM, please contact night-coverage per Amion 12/16/2020, 10:13 AM

## 2020-12-16 NOTE — Anesthesia Procedure Notes (Signed)
Procedure Name: MAC Date/Time: 12/16/2020 11:30 AM Performed by: Jerrye Noble, CRNA Pre-anesthesia Checklist: Patient identified, Emergency Drugs available, Suction available and Patient being monitored Patient Re-evaluated:Patient Re-evaluated prior to induction Oxygen Delivery Method: Simple face mask

## 2020-12-16 NOTE — Interval H&P Note (Signed)
History and Physical Interval Note:  12/16/2020 11:03 AM  Fernando Vega  has presented today for surgery, with the diagnosis of diabetic foot infection.  The various methods of treatment have been discussed with the patient and family. After consideration of risks, benefits and other options for treatment, the patient has consented to  Procedure(s) with comments: DEBRIDEMENT WOUND (Right) - Transmet amputation washout & closure  with delayed primary closure as a surgical intervention.  The patient's history has been reviewed, patient examined, no change in status, stable for surgery.  I have reviewed the patient's chart and labs.  Questions were answered to the patient's satisfaction.     Fernando Vega

## 2020-12-17 LAB — GLUCOSE, CAPILLARY
Glucose-Capillary: 183 mg/dL — ABNORMAL HIGH (ref 70–99)
Glucose-Capillary: 265 mg/dL — ABNORMAL HIGH (ref 70–99)
Glucose-Capillary: 89 mg/dL (ref 70–99)
Glucose-Capillary: 232 mg/dL — ABNORMAL HIGH (ref 70–99)

## 2020-12-17 MED ORDER — ENOXAPARIN SODIUM 40 MG/0.4ML IJ SOSY
40.0000 mg | PREFILLED_SYRINGE | INTRAMUSCULAR | Status: DC
  Administered 2020-12-17 – 2020-12-19 (×3): 40 mg via SUBCUTANEOUS
  Filled 2020-12-17 (×3): qty 0.4

## 2020-12-17 MED ORDER — TRAMADOL HCL 50 MG PO TABS
50.0000 mg | ORAL_TABLET | Freq: Four times a day (QID) | ORAL | Status: DC | PRN
  Administered 2020-12-18: 50 mg via ORAL
  Filled 2020-12-17: qty 1

## 2020-12-17 MED ORDER — INSULIN ASPART 100 UNIT/ML IJ SOLN
10.0000 [IU] | Freq: Three times a day (TID) | INTRAMUSCULAR | Status: DC
  Administered 2020-12-17: 10 [IU] via SUBCUTANEOUS

## 2020-12-17 MED ORDER — CIPROFLOXACIN HCL 500 MG PO TABS: 500.0000 mg | ORAL_TABLET | Freq: Two times a day (BID) | ORAL | Status: DC

## 2020-12-17 MED ORDER — OXYCODONE HCL 5 MG PO TABS
5.0000 mg | ORAL_TABLET | ORAL | Status: DC | PRN
  Administered 2020-12-17 – 2020-12-18 (×2): 5 mg via ORAL
  Filled 2020-12-17 (×2): qty 1

## 2020-12-17 MED ORDER — INSULIN GLARGINE-YFGN 100 UNIT/ML ~~LOC~~ SOLN
12.0000 [IU] | Freq: Two times a day (BID) | SUBCUTANEOUS | Status: DC
  Administered 2020-12-17 – 2020-12-20 (×5): 12 [IU] via SUBCUTANEOUS
  Filled 2020-12-17 (×7): qty 0.12

## 2020-12-17 NOTE — TOC Progression Note (Addendum)
Transition of Care [facility]) - Progression Note    Patient Details  Name: Fernando Vega MRN: [id number] Date of Birth: 1970-03-28  Transition of Care [facility]) CM/SW [place], RN Phone Number: 12/17/2020, 1:55 PM  Clinical Narrative:     Patient lives at home with his wife, his son will stay with him until he is no longer needed but does work Camera operator the day, he has a Conservation officer, nature and a wheelchair at home but will need a 3 in 1, [employer] to bring to the room thru charity prior to dc, his wife has been doing his Wound care and will continue, If he is able to get [facility] thru charity he welcomes it but stated if not his wife has done excellent taking care of him and she will continue.  When it is time for DC TOC will reach out to the [facility] service on for that charity week and request they accept the patient       Expected Discharge Plan and Services                                                 Social Determinants of Health (SDOH) Interventions    Readmission Risk Interventions No flowsheet data found.

## 2020-12-17 NOTE — Progress Notes (Signed)
   PODIATRY PROGRESS NOTE  NAME Fernando Vega MRN [id number] DOB 1970/11/02 DOA 12/11/2020    Chief Complaint  Patient presents with   Wound Infection   Patient POD #1 status post delayed wound closure with Dr. Posey Pronto he is having some pain but overall continues to improve Past Medical History:  Diagnosis Date   Diabetes mellitus without complication ([place])    Neuropathy    Sarcoidosis    Sleep apnea     CBC Latest Ref Rng & Units 12/15/2020 12/14/2020 12/12/2020  WBC 4.0 - 10.5 K/uL 10.4 13.0(H) 7.2  Hemoglobin 13.0 - 17.0 g/dL 9.8(L) 11.0(L) 10.0(L)  Hematocrit 39.0 - 52.0 % 32.2(L) 33.7(L) 32.5(L)  Platelets 150 - 400 K/uL 396 401(H) 376    BMP Latest Ref Rng & Units 12/14/2020 12/12/2020 12/11/2020  Glucose 70 - 99 mg/dL 244(H) 158(H) 215(H)  BUN 6 - 20 mg/dL 12 10 11   Creatinine 0.61 - 1.24 mg/dL 0.74 0.74 0.74  Sodium 135 - 145 mmol/L 134(L) 141 137  Potassium 3.5 - 5.1 mmol/L 3.9 3.7 3.8  Chloride 98 - 111 mmol/L 98 102 100  CO2 22 - 32 mmol/L 27 30 26   Calcium 8.9 - 10.3 mg/dL 8.6(L) 8.7(L) 8.6(L)       Physical Exam: General: The patient is alert and oriented x3 in no acute distress.   Dressing clean dry and intact  ASSESSMENT/PLAN OF CARE S/p right foot delayed wound closure  -Continue PT nonweightbearing RLE -I will change his dressing tomorrow in the late morning, if incision looks good and no issues should be able to go home tomorrow afternoon or evening if he has no home health care or PT needs beyond this   Lanae Crumbly, DPM 12/17/2020

## 2020-12-17 NOTE — TOC Progression Note (Signed)
Transition of Care [facility]) - Progression Note    Patient Details  Name: Fernando Vega MRN: [id number] Date of Birth: Mar 30, 1970  Transition of Care [facility]) CM/SW [place], RN Phone Number: 12/17/2020, 12:08 PM  Clinical Narrative:    The patient is being followed by ID and plan to If he has an oral quinolone option he would be switched to quinolone for 2-4 weeks depending on wound progression and bone pathology, TOC to continue to follow for needs and DC planning        Expected Discharge Plan and Services                                                 Social Determinants of Health (SDOH) Interventions    Readmission Risk Interventions No flowsheet data found.

## 2020-12-17 NOTE — Progress Notes (Signed)
Nutrition Follow-up  DOCUMENTATION CODES:  Not applicable  INTERVENTION:  - Continue diet as ordered, encouraged PO intake -Glucerna Shake po BID, each supplement provides 220 kcal and 10 grams of protein  -Ensure Max po daily, each supplement provides 150 kcal and 30 grams of protein -MVI with minerals daily  NUTRITION DIAGNOSIS:  Increased nutrient needs related to wound healing as evidenced by estimated needs.  GOAL: Patient will meet greater than or equal to 90% of their needs  MONITOR:  PO intake, Supplement acceptance, Labs, Weight trends, Skin, I & O's  REASON FOR ASSESSMENT:  Consult Wound healing  ASSESSMENT:  50 y.o. male with hx of DM type 2, neuropathy, and anemia presented to ED with worsening pain to his right foot where he had recent partial amputation. Concerned for infection. MRI on 9/27 showed osteomyelitis and pt receiving antibiotics via PICC PTA.  8/26 - second and third partial ray amputations of right foot 10/3 - open transmetatarsal amputation right foot 10/6 - closure of amputation incision  Pt resting in bed at the time of assessment. Reports a good appetite and that he is also drinking all of his supplements. Denies GI distress today or any weight changes. Discussed increased nutrition eeds after surgery and with wounds, pt reports understanding and is agreeable to continuing all interventions.   Nutritionally Relevant Medications: Scheduled Meds:  feeding supplement (GLUCERNA SHAKE)  237 mL Oral BID BM   insulin aspart  0-15 Units Subcutaneous TID WC   insulin aspart  0-5 Units Subcutaneous QHS   insulin aspart  8 Units Subcutaneous TID WC   insulin glargine-yfgn  10 Units Subcutaneous BID   multivitamin with minerals  1 tablet Oral Daily   pantoprazole  40 mg Oral Daily   Ensure Max Protein  11 oz Oral QHS   Continuous Infusions:  meropenem (MERREM) IV 1 g (12/17/20 0635)   Labs Reviewed: SBG ranges from 85-265 mg/dL over the last 24  hours HgbA1c 8% (10/1)  Diet Order:   Diet Order             Diet Carb Modified Fluid consistency: Thin; Room service appropriate? Yes  Diet effective now                  EDUCATION NEEDS:  No education needs have been identified at this time  Skin:  Skin Assessment: Reviewed RN Assessment (incisions right foot)  Last BM:  10/6 - type 6  Height:  Ht Readings from Last 1 Encounters:  12/11/20 5\' 6"  (1.676 m)   Weight:  Wt Readings from Last 1 Encounters:  12/11/20 84 kg   Ideal Body Weight:  64.5 kg  BMI:  Body mass index is 29.89 kg/m.  Estimated Nutritional Needs:  Kcal:  1950-2150 Protein:  100-115 grams Fluid:  > 1.9 L   Ranell Patrick, RD, LDN Clinical Dietitian RD pager # available in [place]  After hours/weekend pager # available in [facility]

## 2020-12-17 NOTE — Progress Notes (Signed)
Fernando Vega  YJE:[id number] DOB: Jul 15, 1970 DOA: 12/11/2020 PCP: Langston Reusing, NP    Brief Narrative:  50yo with a history of diabetes, sarcoidosis, sleep apnea, and diabetic neuropathy who presented to the ED with complaints of worsening drainage from a known right foot wound.  He was seen in the wound care clinic 12/08/2020 and had been receiving antibiotics via a PICC line.  MRI arranged 12/07/2020 suggested osteomyelitis.  He was hospitalized 8/22 >11/16/2020 and required amputation of 2 toes for necrotizing soft tissue infection of the right foot.  Cultures at that time grew staph aureus.  He was discharged on IV antibiotics with a wound VAC initially.  He remained on antibiotic therapy through 12/12/2020, making a total of 6 weeks.  Date of Positive COVID Test:  12/11/20 (last day of inpatient isolation 12/21/20)  COVID-19 specific Treatment: None indicated  Consultants:  Podiatry ID  Code Status: FULL CODE  Antimicrobials:  Zosyn 10/1 Meropenem 10/2 > 10/7 Vancomycin 10/1 > 10/6 Cipro 10/8 >  DVT prophylaxis: Subcutaneous heparin  Subjective: No new events overnight.  Tolerated delayed closure of his wound in the OR yesterday without difficulty.  Afebrile.  Vital signs stable.  CBG improving. Reporting more pain in R foot today.   Assessment & Plan:  Diabetic foot infection with osteomyelitis Wound culture revealing Acinetobacter and Enterobacter -antibiotics per ID -debridement w/ TMA 10/3 by Dr. Sherryle Lis -successful closure of wound previously left open in OR 10/6 -cleared for discharge from a surgical standpoint 10/6 -nonweightbearing to right lower extremity -posterior splint to remain in place -dressing to remain in place until followed up by Podiatry -follow-up with Dr. Sherryle Lis in outpatient setting 1 week from discharge -transition to oral antibiotics 10/8 and discharge home if remains stable overnight  DM2 uncontrolled with hyperglycemia CBG improved with  adjustment in insulin regimen -adjusted again today and monitor for more strict control  Anemia of chronic disease No evidence of acute blood loss -felt to be due to smoldering long-term infection  OSA Continue usual CPAP regimen  COVID virus infection Incidental finding -no indication for specific treatment presently   Family Communication: No family present Status is: Inpatient  Remains inpatient appropriate because:Inpatient level of care appropriate due to severity of illness  Dispo: The patient is from: Home              Anticipated d/c is to:  Unclear              Patient currently is not medically stable to d/c.   Difficult to place patient No   Objective: Blood pressure 137/79, pulse 97, temperature 99.5 F (37.5 C), resp. rate 18, height 5\' 6"  (1.676 m), weight 84 kg, SpO2 92 %.  Intake/Output Summary (Last 24 hours) at 12/17/2020 0942 Last data filed at 12/17/2020 0100 Gross per 24 hour  Intake 854.27 ml  Output --  Net 854.27 ml    Filed Weights   12/11/20 1833  Weight: 84 kg    Examination: General: No acute respiratory distress Lungs: Clear to auscultation bilaterally -no wheezing Cardiovascular: RRR without murmur Abdomen: Nontender, nondistended, soft, bowel sounds positive, no rebound Extremities: No significant edema bilateral lower extremities -right foot dressed and dry  CBC: Recent Labs  Lab 12/11/20 1835 12/12/20 0550 12/14/20 0530 12/15/20 0630  WBC 8.4 7.2 13.0* 10.4  NEUTROABS 5.3  --   --   --   HGB 10.8* 10.0* 11.0* 9.8*  HCT 34.0* 32.5* 33.7* 32.2*  MCV 87.2 89.0  86.9 90.7  PLT 392 376 401* 409    Basic Metabolic Panel: Recent Labs  Lab 12/11/20 1835 12/12/20 0550 12/14/20 0530  NA 137 141 134*  K 3.8 3.7 3.9  CL 100 102 98  CO2 26 30 27   GLUCOSE 215* 158* 244*  BUN 11 10 12   CREATININE 0.74 0.74 0.74  CALCIUM 8.6* 8.7* 8.6*    GFR: Estimated Creatinine Clearance: 109.8 mL/min (by C-G formula based on SCr of 0.74  mg/dL).  Liver Function Tests: Recent Labs  Lab 12/11/20 1835 12/12/20 0550  AST 17 14*  ALT 8 7  ALKPHOS 78 70  BILITOT 0.8 0.7  PROT 7.7 6.8  ALBUMIN 3.4* 3.1*     Coagulation Profile: Recent Labs  Lab 12/12/20 0550  INR 1.1     HbA1C: Hemoglobin A1C  Date/Time Value Ref Range Status  12/01/2012 01:42 PM 12.3 (H) 4.2 - 6.3 % Final    Comment:    The American Diabetes Association recommends that a primary goal of therapy should be <7% and that physicians should reevaluate the treatment regimen in patients with HbA1c values consistently >8%.    Hgb A1c MFr Bld  Date/Time Value Ref Range Status  12/11/2020 06:35 PM 8.0 (H) 4.8 - 5.6 % Final    Comment:    (NOTE)         Prediabetes: 5.7 - 6.4         Diabetes: >6.4         Glycemic control for adults with diabetes: <7.0   11/01/2020 03:36 PM 11.8 (H) 4.8 - 5.6 % Final    Comment:    (NOTE) Pre diabetes:          5.7%-6.4%  Diabetes:              >6.4%  Glycemic control for   <7.0% adults with diabetes     CBG: Recent Labs  Lab 12/16/20 0833 12/16/20 1229 12/16/20 1709 12/16/20 2117 12/17/20 0758  GLUCAP 235* 108* 224* 162* 183*     Recent Results (from the past 240 hour(s))  Aerobic Culture w Gram Stain (superficial specimen)     Status: None   Collection Time: 12/07/20 12:14 PM   Specimen: Wound  Result Value Ref Range Status   Specimen Description   Final    WOUND Performed at [facility], [address] [place], [place] [address]    Special Requests   Final    RIGHT FOOT WOUND Performed at [facility], [address] [place], [place] [address]    Gram Stain   Final    NO SQUAMOUS EPITHELIAL CELLS SEEN FEW WBC SEEN FEW GRAM POSITIVE COCCI Performed at [facility], [place] [address] [place], [place] [address]    Culture   Final    MODERATE ACINETOBACTER CALCOACETICUS/BAUMANNII COMPLEX MODERATE ENTEROBACTER CLOACAE    Report Status 12/11/2020 FINAL   Final   Organism ID, Bacteria ACINETOBACTER CALCOACETICUS/BAUMANNII COMPLEX  Final   Organism ID, Bacteria ENTEROBACTER CLOACAE  Final      Susceptibility   Acinetobacter calcoaceticus/baumannii complex - MIC*    CEFTAZIDIME 8 SENSITIVE Sensitive     CIPROFLOXACIN <=0.25 SENSITIVE Sensitive     GENTAMICIN <=1 SENSITIVE Sensitive     IMIPENEM <=0.25 SENSITIVE Sensitive     PIP/TAZO >=128 RESISTANT Resistant     TRIMETH/SULFA <=20 SENSITIVE Sensitive     AMPICILLIN/SULBACTAM 4 SENSITIVE Sensitive     * MODERATE ACINETOBACTER CALCOACETICUS/BAUMANNII COMPLEX   Enterobacter  cloacae - MIC*    CEFAZOLIN >=64 RESISTANT Resistant     CEFEPIME <=0.12 SENSITIVE Sensitive     CEFTAZIDIME <=1 SENSITIVE Sensitive     CIPROFLOXACIN <=0.25 SENSITIVE Sensitive     GENTAMICIN <=1 SENSITIVE Sensitive     IMIPENEM 0.5 SENSITIVE Sensitive     TRIMETH/SULFA <=20 SENSITIVE Sensitive     PIP/TAZO <=4 SENSITIVE Sensitive     * MODERATE ENTEROBACTER CLOACAE  Resp Panel by RT-PCR (Flu A&B, Covid) Nasopharyngeal Swab     Status: Abnormal   Collection Time: 12/11/20  9:25 PM   Specimen: Nasopharyngeal Swab; Nasopharyngeal(NP) swabs in vial transport medium  Result Value Ref Range Status   SARS Coronavirus 2 by RT PCR POSITIVE (A) NEGATIVE Final    Comment: RESULT CALLED TO, READ BACK BY AND VERIFIED WITH: JULIA SHEEHAN @ 2236 12/11/20 LFD (NOTE) SARS-CoV-2 target nucleic acids are DETECTED.  The SARS-CoV-2 RNA is generally detectable in upper respiratory specimens during the acute phase of infection. Positive results are indicative of the presence of the identified virus, but do not rule out bacterial infection or co-infection with other pathogens not detected by the test. Clinical correlation with patient history and other diagnostic information is necessary to determine patient infection status. The expected result is Negative.  Fact Sheet for Patients: EntrepreneurPulse.com.au  Fact  Sheet for Healthcare Providers: IncredibleEmployment.be  This test is not yet approved or cleared by the [place] FDA and  has been authorized for detection and/or diagnosis of SARS-CoV-2 by FDA under an Emergency Use Authorization (EUA).  This EUA will remain in effect (meaning this test can be  used) for the duration of  the COVID-19 declaration under Section 564(b)(1) of the Act, 21 U.S.C. section 360bbb-3(b)(1), unless the authorization is terminated or revoked sooner.     Influenza A by PCR NEGATIVE NEGATIVE Final   Influenza B by PCR NEGATIVE NEGATIVE Final    Comment: (NOTE) The Xpert Xpress SARS-CoV-2/FLU/RSV plus assay is intended as an aid in the diagnosis of influenza from Nasopharyngeal swab specimens and should not be used as a sole basis for treatment. Nasal washings and aspirates are unacceptable for Xpert Xpress SARS-CoV-2/FLU/RSV testing.  Fact Sheet for Patients: EntrepreneurPulse.com.au  Fact Sheet for Healthcare Providers: IncredibleEmployment.be  This test is not yet approved or cleared by the [place] FDA and has been authorized for detection and/or diagnosis of SARS-CoV-2 by FDA under an Emergency Use Authorization (EUA). This EUA will remain in effect (meaning this test can be used) for the duration of the COVID-19 declaration under Section 564(b)(1) of the Act, 21 U.S.C. section 360bbb-3(b)(1), unless the authorization is terminated or revoked.  Performed at [facility], [place], [place] [address]   Anaerobic culture w Gram Stain     Status: None   Collection Time: 12/13/20  7:09 PM   Specimen: PATH Other; Tissue  Result Value Ref Range Status   Specimen Description TISSUE RIGHT FOOT  Final   Special Requests BONE AND SOFT TISSUE  Final   Gram Stain   Final    RARE WBC PRESENT, PREDOMINANTLY MONONUCLEAR FEW GRAM POSITIVE COCCI IN PAIRS RARE GRAM POSITIVE  COCCI IN CLUSTERS    Culture   Final    CULTURE CREDITED SEE ACCN [id number] ORDERED INCORRECTLY Performed at [facility], [place] [address], [place], [place] [address]    Report Status 12/15/2020 FINAL  Final  Aerobic/Anaerobic Culture w Gram Stain (surgical/deep wound)  Status: None (Preliminary result)   Collection Time: 12/13/20  9:30 PM   Specimen: Bone; Tissue  Result Value Ref Range Status   Specimen Description BONE AND SIFT TISSUE  Final   Special Requests TISSUE RIGHT FOOT  Final   Gram Stain   Final    RARE WBC PRESENT,BOTH PMN AND MONONUCLEAR FEW GRAM POSITIVE COCCI IN PAIRS IN CLUSTERS FEW GRAM NEGATIVE RODS Performed at [facility], [place] [address] [place], [place] [address]    Culture   Final    FEW ENTEROBACTER CLOACAE FEW GRAM NEGATIVE RODS SUSCEPTIBILITIES TO FOLLOW NO ANAEROBES ISOLATED; CULTURE IN PROGRESS FOR 5 DAYS    Report Status PENDING  Incomplete   Organism ID, Bacteria ENTEROBACTER CLOACAE  Final      Susceptibility   Enterobacter cloacae - MIC*    CEFAZOLIN >=64 RESISTANT Resistant     CEFEPIME <=0.12 SENSITIVE Sensitive     CEFTAZIDIME <=1 SENSITIVE Sensitive     CIPROFLOXACIN <=0.25 SENSITIVE Sensitive     GENTAMICIN <=1 SENSITIVE Sensitive     IMIPENEM <=0.25 SENSITIVE Sensitive     TRIMETH/SULFA <=20 SENSITIVE Sensitive     PIP/TAZO <=4 SENSITIVE Sensitive     * FEW ENTEROBACTER CLOACAE      Scheduled Meds:  amLODipine  10 mg Oral Daily   Chlorhexidine Gluconate Cloth  6 each Topical Daily   feeding supplement (GLUCERNA SHAKE)  237 mL Oral BID BM   gabapentin  100 mg Oral QHS   heparin  5,000 Units Subcutaneous Q8H   insulin aspart  0-15 Units Subcutaneous TID WC   insulin aspart  0-5 Units Subcutaneous QHS   insulin aspart  8 Units Subcutaneous TID WC   insulin glargine-yfgn  10 Units Subcutaneous BID   multivitamin with minerals  1 tablet Oral Daily   pantoprazole  40 mg Oral Daily   Ensure Max Protein  11  oz Oral QHS   Continuous Infusions:  meropenem (MERREM) IV 1 g (12/17/20 9150)     LOS: 6 days   Cherene Altes, MD [facility] Office  [phone] Pager - Text Page per Amion  If 7PM-7AM, please contact night-coverage per Amion 12/17/2020, 9:42 AM

## 2020-12-17 NOTE — Evaluation (Signed)
Physical Therapy Evaluation Patient Details Name: Fernando Vega MRN: [id number] DOB: 1971-01-21 Today's Date: 12/17/2020  History of Present Illness  Fernando Vega is a 33yoM who comes [facility] on 10/1 for ongoing infection of Rt foot wounds. Pt underwent Rt TMA on 10/3 with podiatry. Pt has a (+) covid test from 12/11/20.  Clinical Impression  Pt admitted with above diagnosis. Pt currently with functional limitations due to the deficits listed below (see "PT Problem List"). Upon entry, pt in bed, awake and agreeable to participate. The pt is alert, pleasant, interactive, and able to provide info regarding prior level of function, both in tolerance and independence. Educated patient on weight-bearing restrictions, safety precautions, use of DME use for basic mobility to perform ADL and access home environment. Pt has been performed in-room distances with RW for past few days, continues to maintain strict NWB. Patient's performance this date reveals decreased ability, independence, and tolerance in performing all basic mobility required for performance of activities of daily living. Pt requires additional DME, close physical assistance, and cues for safe participate in mobility. Pt will benefit from skilled PT intervention to increase independence and safety with basic mobility in preparation for discharge to the venue listed below.       Recommendations for follow up therapy are one component of a multi-disciplinary discharge planning process, led by the attending physician.  Recommendations may be updated based on patient status, additional functional criteria and insurance authorization.  Follow Up Recommendations Home health PT;Supervision - Intermittent    Equipment Recommendations  3in1 (PT) (for bathing/toiletting)    Recommendations for Other Services       Precautions / Restrictions Precautions Precautions: Fall Restrictions Weight Bearing Restrictions: No RLE Weight Bearing: Non weight  bearing      Mobility  Bed Mobility Overal bed mobility: Modified Independent                  Transfers Overall transfer level: Modified independent Equipment used: Rolling walker (2 wheeled)                Ambulation/Gait   Gait Distance (Feet): 30 Feet Assistive device: Rolling walker (2 wheeled)       General Gait Details: hop to gait, maintains strict NWB throughout;given cues for safer strategy for turnarounds in stnading.  Stairs            Wheelchair Mobility    Modified Rankin (Stroke Patients Only)       Balance Overall balance assessment: Modified Independent;Mild deficits observed, not formally tested                                           Pertinent Vitals/Pain Pain Assessment: 0-10 Pain Score: 7  Pain Location: operative foot Pain Descriptors / Indicators: Operative site guarding Pain Intervention(s): Limited activity within patient's tolerance;Monitored during session    Home Living Family/patient expects to be discharged to:: Private residence Living Arrangements:  (Son will stay with him until not needed; Son works during day) Available Help at Discharge: Family Type of Home: House Home Access: Stairs to enter Entrance Stairs-Rails: None Technical brewer of Steps: 3 Home Layout: One level Home Equipment: Environmental consultant - 2 wheels;Wheelchair - manual      Prior Function                 Hand Dominance  Extremity/Trunk Assessment   Upper Extremity Assessment Upper Extremity Assessment: Overall WFL for tasks assessed            Communication      Cognition Arousal/Alertness: Awake/alert Behavior During Therapy: WFL for tasks assessed/performed Overall Cognitive Status: Within Functional Limits for tasks assessed                                        General Comments      Exercises     Assessment/Plan    PT Assessment Patient needs continued PT services   PT Problem List Decreased strength;Decreased range of motion;Decreased activity tolerance;Decreased balance;Decreased mobility;Decreased cognition;Decreased safety awareness       PT Treatment Interventions DME instruction;Balance training;Gait training;Stair training;Functional mobility training;Therapeutic activities;Therapeutic exercise;Patient/family education    PT Goals (Current goals can be found in the Care Plan section)  Acute Rehab PT Goals Patient Stated Goal: return to home with help of son PT Goal Formulation: With patient Time For Goal Achievement: 12/31/20 Potential to Achieve Goals: Good    Frequency 7X/week   Barriers to discharge        Co-evaluation               AM-PAC PT "6 Clicks" Mobility  Outcome Measure Help needed turning from your back to your side while in a flat bed without using bedrails?: A Little Help needed moving from lying on your back to sitting on the side of a flat bed without using bedrails?: A Little Help needed moving to and from a bed to a chair (including a wheelchair)?: A Little Help needed standing up from a chair using your arms (e.g., wheelchair or bedside chair)?: A Little Help needed to walk in hospital room?: A Lot Help needed climbing 3-5 steps with a railing? : A Lot 6 Click Score: 16    End of Session   Activity Tolerance: Patient tolerated treatment well;No increased pain Patient left: in bed;with call bell/phone within reach Nurse Communication: Mobility status PT Visit Diagnosis: Unsteadiness on feet (R26.81);Other abnormalities of gait and mobility (R26.89);Difficulty in walking, not elsewhere classified (R26.2)    Time: 3254-9826 PT Time Calculation (min) (ACUTE ONLY): 21 min   Charges:   PT Evaluation $PT Eval Moderate Complexity: 1 Mod         1:04 PM, 12/17/20 Fernando Vega, PT, DPT Physical Therapist - [facility]  [phone] ([place])    [place]  C 12/17/2020, 1:02 PM

## 2020-12-18 LAB — CBC
HCT: 28.9 % — ABNORMAL LOW (ref 39.0–52.0)
Hemoglobin: 9.1 g/dL — ABNORMAL LOW (ref 13.0–17.0)
MCH: 27.3 pg (ref 26.0–34.0)
MCHC: 31.5 g/dL (ref 30.0–36.0)
MCV: 86.8 fL (ref 80.0–100.0)
Platelets: 362 10*3/uL (ref 150–400)
RBC: 3.33 MIL/uL — ABNORMAL LOW (ref 4.22–5.81)
RDW: 13.9 % (ref 11.5–15.5)
WBC: 15.2 10*3/uL — ABNORMAL HIGH (ref 4.0–10.5)
nRBC: 0 % (ref 0.0–0.2)

## 2020-12-18 LAB — GLUCOSE, CAPILLARY
Glucose-Capillary: 190 mg/dL — ABNORMAL HIGH (ref 70–99)
Glucose-Capillary: 209 mg/dL — ABNORMAL HIGH (ref 70–99)
Glucose-Capillary: 180 mg/dL — ABNORMAL HIGH (ref 70–99)
Glucose-Capillary: 267 mg/dL — ABNORMAL HIGH (ref 70–99)

## 2020-12-18 LAB — COMPREHENSIVE METABOLIC PANEL
ALT: 12 U/L (ref 0–44)
AST: 16 U/L (ref 15–41)
Albumin: 2.8 g/dL — ABNORMAL LOW (ref 3.5–5.0)
Alkaline Phosphatase: 65 U/L (ref 38–126)
Anion gap: 10 (ref 5–15)
BUN: 16 mg/dL (ref 6–20)
CO2: 29 mmol/L (ref 22–32)
Calcium: 8.6 mg/dL — ABNORMAL LOW (ref 8.9–10.3)
Chloride: 91 mmol/L — ABNORMAL LOW (ref 98–111)
Creatinine, Ser: 0.65 mg/dL (ref 0.61–1.24)
GFR, Estimated: >60 mL/min (ref >60)
Glucose, Bld: 189 mg/dL — ABNORMAL HIGH (ref 70–99)
Potassium: 4.1 mmol/L (ref 3.5–5.1)
Sodium: 130 mmol/L — ABNORMAL LOW (ref 135–145)
Total Bilirubin: 0.7 mg/dL (ref 0.3–1.2)
Total Protein: 7.1 g/dL (ref 6.5–8.1)

## 2020-12-18 MED ORDER — INSULIN ASPART 100 UNIT/ML IJ SOLN
12.0000 [IU] | Freq: Three times a day (TID) | INTRAMUSCULAR | Status: DC
  Administered 2020-12-19 – 2020-12-20 (×3): 12 [IU] via SUBCUTANEOUS
  Filled 2020-12-18 (×3): qty 1

## 2020-12-18 MED ORDER — ONDANSETRON HCL 4 MG/2ML IJ SOLN
4.0000 mg | Freq: Four times a day (QID) | INTRAMUSCULAR | Status: DC | PRN
  Administered 2020-12-18 (×2): 4 mg via INTRAVENOUS
  Filled 2020-12-18 (×2): qty 2

## 2020-12-18 MED ORDER — SODIUM CHLORIDE 0.9 % IV SOLN: 1.0000 g | Freq: Three times a day (TID) | INTRAVENOUS | Status: DC

## 2020-12-18 MED ORDER — METOCLOPRAMIDE HCL 5 MG/ML IJ SOLN
5.0000 mg | Freq: Three times a day (TID) | INTRAMUSCULAR | Status: DC
  Administered 2020-12-18 – 2020-12-19 (×3): 5 mg via INTRAVENOUS
  Filled 2020-12-18 (×5): qty 2

## 2020-12-18 MED ORDER — SODIUM CHLORIDE 0.9 % IV SOLN
1.0000 g | Freq: Three times a day (TID) | INTRAVENOUS | Status: DC
  Administered 2020-12-18 – 2020-12-19 (×3): 1 g via INTRAVENOUS
  Filled 2020-12-18 (×5): qty 1

## 2020-12-18 MED ORDER — SENNOSIDES-DOCUSATE SODIUM 8.6-50 MG PO TABS
1.0000 | ORAL_TABLET | Freq: Two times a day (BID) | ORAL | Status: DC
  Administered 2020-12-19 – 2020-12-20 (×3): 1 via ORAL
  Filled 2020-12-18 (×4): qty 1

## 2020-12-18 MED ORDER — ACETAMINOPHEN 500 MG PO TABS
500.0000 mg | ORAL_TABLET | Freq: Four times a day (QID) | ORAL | Status: DC | PRN
  Filled 2020-12-18: qty 1

## 2020-12-18 NOTE — Progress Notes (Signed)
PT Cancellation Note  Patient Details Name: Fernando Vega MRN: [id number] DOB: 01/14/1971   Cancelled Treatment:     PT attempt , 2nd attempt today. Pt continues to be dealing with nausea and vomiting. RN confirmed. Will hold PT today and return tomorrow when pt is more appropriate.    Willette Pa 12/18/2020, 12:28 PM

## 2020-12-18 NOTE — Progress Notes (Addendum)
PT Cancellation Note  Patient Details Name: Fernando Vega MRN: [id number] DOB: 02-06-1971   Cancelled Treatment:     PT attempt. Pt politely refused. Per pt and RN, up a lot over night vomiting. " I just feel awful right now. Can you come back later." Will return later this afternoon and continue to follow per current POC.    Willette Pa 12/18/2020, 9:30 AM

## 2020-12-18 NOTE — Progress Notes (Signed)
Ports  Fernando Vega  PNT:[id number] DOB: 11/15/1970 DOA: 12/11/2020 PCP: Langston Reusing, NP    Brief Narrative:  50yo with a history of diabetes, sarcoidosis, sleep apnea, and diabetic neuropathy who presented to the ED with complaints of worsening drainage from a known right foot wound.  He was seen in the wound care clinic 12/08/2020 and had been receiving antibiotics via a PICC line.  MRI arranged 12/07/2020 suggested osteomyelitis.  He was hospitalized 8/22 >11/16/2020 and required amputation of 2 toes for necrotizing soft tissue infection of the right foot.  Cultures at that time grew staph aureus.  He was discharged on IV antibiotics with a wound VAC initially.  He remained on antibiotic therapy through 12/12/2020, making a total of 6 weeks.  Date of Positive COVID Test:  12/11/20 (last day of inpatient isolation 12/21/20)  COVID-19 specific Treatment: None indicated  Consultants:  Podiatry ID  Code Status: FULL CODE  Antimicrobials:  Zosyn 10/1 Meropenem 10/2 > 10/7 Vancomycin 10/1 > 10/6 Cipro 10/8 >  DVT prophylaxis: Subcutaneous heparin  Subjective: Afebrile.  Vital signs stable.  Developed nausea and vomiting overnight with inability to tolerate oral intake.  Denies chest pain or shortness of breath.  Pain in foot is reasonably well controlled at the time of my visit.  States that he has had a bowel movement within the last 24 hours.  Denies abdominal pain.  Assessment & Plan:  Diabetic foot infection with osteomyelitis Wound culture revealing Acinetobacter and Enterobacter -antibiotics per ID -debridement w/ TMA 10/3 by Dr. Sherryle Lis -successful closure of wound previously left open in OR 10/6 -cleared for discharge from a surgical standpoint 10/6 -nonweightbearing to right lower extremity -posterior splint to remain in place -dressing to remain in place until followed up by Podiatry -follow-up with Dr. Sherryle Lis in outpatient setting 1 week from discharge - plan was to  transition to oral antibiotics today for discharge home but with patient now vomiting will delay discharge and continue IV antibiotics  DM2 uncontrolled with hyperglycemia CBG improved with adjustment in insulin regimen -fine-tuning further today for more strict consistent control  Nausea and vomiting Acute development overnight -etiology not clear -exam not consistent with ileus -unable to tolerate oral intake therefore unsafe for discharge home presently -short trial of scheduled Reglan -if persists consider KUB to rule out ileus  Anemia of chronic disease No evidence of acute blood loss -felt to be due to smoldering long-term infection -monitor intermittently  OSA Continue usual CPAP regimen  COVID virus infection Incidental finding -no indication for specific treatment presently   Family Communication: No family present Status is: Inpatient  Remains inpatient appropriate because:Inpatient level of care appropriate due to severity of illness  Dispo: The patient is from: Home              Anticipated d/c is to:  Unclear              Patient currently is not medically stable to d/c.   Difficult to place patient No   Objective: Blood pressure 129/62, pulse 99, temperature 98.1 F (36.7 C), resp. rate 16, height 5\' 6"  (1.676 m), weight 84 kg, SpO2 98 %.  Intake/Output Summary (Last 24 hours) at 12/18/2020 0930 Last data filed at 12/18/2020 0406 Gross per 24 hour  Intake --  Output 1200 ml  Net -1200 ml    Filed Weights   12/11/20 1833  Weight: 84 kg    Examination: General: No acute respiratory distress Lungs: Clear to auscultation  bilaterally without wheezing Cardiovascular: RRR without murmur Abdomen: Nontender, nondistended, soft, bowel sounds positive, no rebound Extremities: No significant edema B LE - right foot dressed and dry   CBC: Recent Labs  Lab 12/11/20 1835 12/12/20 0550 12/14/20 0530 12/15/20 0630 12/18/20 0445  WBC 8.4   < > 13.0* 10.4 15.2*   NEUTROABS 5.3  --   --   --   --   HGB 10.8*   < > 11.0* 9.8* 9.1*  HCT 34.0*   < > 33.7* 32.2* 28.9*  MCV 87.2   < > 86.9 90.7 86.8  PLT 392   < > 401* 396 362   < > = values in this interval not displayed.    Basic Metabolic Panel: Recent Labs  Lab 12/12/20 0550 12/14/20 0530 12/18/20 0445  NA 141 134* 130*  K 3.7 3.9 4.1  CL 102 98 91*  CO2 30 27 29   GLUCOSE 158* 244* 189*  BUN 10 12 16   CREATININE 0.74 0.74 0.65  CALCIUM 8.7* 8.6* 8.6*    GFR: Estimated Creatinine Clearance: 109.8 mL/min (by C-G formula based on SCr of 0.65 mg/dL).  Liver Function Tests: Recent Labs  Lab 12/11/20 1835 12/12/20 0550 12/18/20 0445  AST 17 14* 16  ALT 8 7 12   ALKPHOS 78 70 65  BILITOT 0.8 0.7 0.7  PROT 7.7 6.8 7.1  ALBUMIN 3.4* 3.1* 2.8*     Coagulation Profile: Recent Labs  Lab 12/12/20 0550  INR 1.1     HbA1C: Hemoglobin A1C  Date/Time Value Ref Range Status  12/01/2012 01:42 PM 12.3 (H) 4.2 - 6.3 % Final    Comment:    The American Diabetes Association recommends that a primary goal of therapy should be <7% and that physicians should reevaluate the treatment regimen in patients with HbA1c values consistently >8%.    Hgb A1c MFr Bld  Date/Time Value Ref Range Status  12/11/2020 06:35 PM 8.0 (H) 4.8 - 5.6 % Final    Comment:    (NOTE)         Prediabetes: 5.7 - 6.4         Diabetes: >6.4         Glycemic control for adults with diabetes: <7.0   11/01/2020 03:36 PM 11.8 (H) 4.8 - 5.6 % Final    Comment:    (NOTE) Pre diabetes:          5.7%-6.4%  Diabetes:              >6.4%  Glycemic control for   <7.0% adults with diabetes     CBG: Recent Labs  Lab 12/17/20 1156 12/17/20 1542 12/17/20 2149 12/18/20 0400 12/18/20 0815  GLUCAP 265* 89 232* 190* 209*     Recent Results (from the past 240 hour(s))  Resp Panel by RT-PCR (Flu A&B, Covid) Nasopharyngeal Swab     Status: Abnormal   Collection Time: 12/11/20  9:25 PM   Specimen:  Nasopharyngeal Swab; Nasopharyngeal(NP) swabs in vial transport medium  Result Value Ref Range Status   SARS Coronavirus 2 by RT PCR POSITIVE (A) NEGATIVE Final    Comment: RESULT CALLED TO, READ BACK BY AND VERIFIED WITH: JULIA SHEEHAN @ 2236 12/11/20 LFD (NOTE) SARS-CoV-2 target nucleic acids are DETECTED.  The SARS-CoV-2 RNA is generally detectable in upper respiratory specimens during the acute phase of infection. Positive results are indicative of the presence of the identified virus, but do not rule out bacterial infection or co-infection with other pathogens not  detected by the test. Clinical correlation with patient history and other diagnostic information is necessary to determine patient infection status. The expected result is Negative.  Fact Sheet for Patients: EntrepreneurPulse.com.au  Fact Sheet for Healthcare Providers: IncredibleEmployment.be  This test is not yet approved or cleared by the [place] FDA and  has been authorized for detection and/or diagnosis of SARS-CoV-2 by FDA under an Emergency Use Authorization (EUA).  This EUA will remain in effect (meaning this test can be  used) for the duration of  the COVID-19 declaration under Section 564(b)(1) of the Act, 21 U.S.C. section 360bbb-3(b)(1), unless the authorization is terminated or revoked sooner.     Influenza A by PCR NEGATIVE NEGATIVE Final   Influenza B by PCR NEGATIVE NEGATIVE Final    Comment: (NOTE) The Xpert Xpress SARS-CoV-2/FLU/RSV plus assay is intended as an aid in the diagnosis of influenza from Nasopharyngeal swab specimens and should not be used as a sole basis for treatment. Nasal washings and aspirates are unacceptable for Xpert Xpress SARS-CoV-2/FLU/RSV testing.  Fact Sheet for Patients: EntrepreneurPulse.com.au  Fact Sheet for Healthcare Providers: IncredibleEmployment.be  This test is not yet approved or  cleared by the [place] FDA and has been authorized for detection and/or diagnosis of SARS-CoV-2 by FDA under an Emergency Use Authorization (EUA). This EUA will remain in effect (meaning this test can be used) for the duration of the COVID-19 declaration under Section 564(b)(1) of the Act, 21 U.S.C. section 360bbb-3(b)(1), unless the authorization is terminated or revoked.  Performed at [facility], [place], [place] [address]   Anaerobic culture w Gram Stain     Status: None   Collection Time: 12/13/20  7:09 PM   Specimen: PATH Other; Tissue  Result Value Ref Range Status   Specimen Description TISSUE RIGHT FOOT  Final   Special Requests BONE AND SOFT TISSUE  Final   Gram Stain   Final    RARE WBC PRESENT, PREDOMINANTLY MONONUCLEAR FEW GRAM POSITIVE COCCI IN PAIRS RARE GRAM POSITIVE COCCI IN CLUSTERS    Culture   Final    CULTURE CREDITED SEE ACCN [id number] POEU [id number] ORDERED INCORRECTLY Performed at [facility], [place] [address], [place], [place] [address]    Report Status 12/15/2020 FINAL  Final  Aerobic/Anaerobic Culture w Gram Stain (surgical/deep wound)     Status: None (Preliminary result)   Collection Time: 12/13/20  9:30 PM   Specimen: Bone; Tissue  Result Value Ref Range Status   Specimen Description BONE AND SIFT TISSUE  Final   Special Requests TISSUE RIGHT FOOT  Final   Gram Stain   Final    RARE WBC PRESENT,BOTH PMN AND MONONUCLEAR FEW GRAM POSITIVE COCCI IN PAIRS IN CLUSTERS FEW GRAM NEGATIVE RODS Performed at [facility], [place] [address] [place], [place] [address]    Culture   Final    FEW ENTEROBACTER CLOACAE FEW ACINETOBACTER BAUMANNII NO ANAEROBES ISOLATED; CULTURE IN PROGRESS FOR 5 DAYS    Report Status PENDING  Incomplete   Organism ID, Bacteria ENTEROBACTER CLOACAE  Final   Organism ID, Bacteria ACINETOBACTER BAUMANNII  Final      Susceptibility   Acinetobacter baumannii - MIC*    CEFTAZIDIME 4  SENSITIVE Sensitive     CIPROFLOXACIN <=0.25 SENSITIVE Sensitive     GENTAMICIN <=1 SENSITIVE Sensitive     IMIPENEM <=0.25 SENSITIVE Sensitive     PIP/TAZO <=4 SENSITIVE Sensitive     TRIMETH/SULFA <=20 SENSITIVE Sensitive  AMPICILLIN/SULBACTAM <=2 SENSITIVE Sensitive     * FEW ACINETOBACTER BAUMANNII   Enterobacter cloacae - MIC*    CEFAZOLIN >=64 RESISTANT Resistant     CEFEPIME <=0.12 SENSITIVE Sensitive     CEFTAZIDIME <=1 SENSITIVE Sensitive     CIPROFLOXACIN <=0.25 SENSITIVE Sensitive     GENTAMICIN <=1 SENSITIVE Sensitive     IMIPENEM <=0.25 SENSITIVE Sensitive     TRIMETH/SULFA <=20 SENSITIVE Sensitive     PIP/TAZO <=4 SENSITIVE Sensitive     * FEW ENTEROBACTER CLOACAE      Scheduled Meds:  amLODipine  10 mg Oral Daily   Chlorhexidine Gluconate Cloth  6 each Topical Daily   ciprofloxacin  500 mg Oral BID   enoxaparin (LOVENOX) injection  40 mg Subcutaneous Q24H   feeding supplement (GLUCERNA SHAKE)  237 mL Oral BID BM   gabapentin  100 mg Oral QHS   insulin aspart  0-15 Units Subcutaneous TID WC   insulin aspart  0-5 Units Subcutaneous QHS   insulin aspart  10 Units Subcutaneous TID WC   insulin glargine-yfgn  12 Units Subcutaneous BID   multivitamin with minerals  1 tablet Oral Daily   pantoprazole  40 mg Oral Daily   Ensure Max Protein  11 oz Oral QHS      LOS: 7 days   Cherene Altes, MD [facility] Office  [phone] Pager - Text Page per Amion  If 7PM-7AM, please contact night-coverage per Amion 12/18/2020, 9:30 AM

## 2020-12-18 NOTE — Progress Notes (Signed)
   PODIATRY PROGRESS NOTE  NAME Fernando Vega MRN [id number] DOB 05-13-1970 DOA 12/11/2020    Chief Complaint  Patient presents with   Wound Infection   Patient POD #2 status post delayed wound closure with Dr. Posey Pronto he is having some pain but overall continues to improve Past Medical History:  Diagnosis Date   Diabetes mellitus without complication ([place])    Neuropathy    Sarcoidosis    Sleep apnea     CBC Latest Ref Rng & Units 12/18/2020 12/15/2020 12/14/2020  WBC 4.0 - 10.5 K/uL 15.2(H) 10.4 13.0(H)  Hemoglobin 13.0 - 17.0 g/dL 9.1(L) 9.8(L) 11.0(L)  Hematocrit 39.0 - 52.0 % 28.9(L) 32.2(L) 33.7(L)  Platelets 150 - 400 K/uL 362 396 401(H)    BMP Latest Ref Rng & Units 12/18/2020 12/14/2020 12/12/2020  Glucose 70 - 99 mg/dL 189(H) 244(H) 158(H)  BUN 6 - 20 mg/dL 16 12 10   Creatinine 0.61 - 1.24 mg/dL 0.65 0.74 0.74  Sodium 135 - 145 mmol/L 130(L) 134(L) 141  Potassium 3.5 - 5.1 mmol/L 4.1 3.9 3.7  Chloride 98 - 111 mmol/L 91(L) 98 102  CO2 22 - 32 mmol/L 29 27 30   Calcium 8.9 - 10.3 mg/dL 8.6(L) 8.6(L) 8.7(L)       Physical Exam: General: The patient is alert and oriented x3 in no acute distress.  Dressing and splint intact and clean and dry Some bloody drainage in dressing Incision healing well, bruising dorsally along incision but no necrosis with good capillary fill time, no purulence or cellulitis  Dressing clean dry and intact  ASSESSMENT/PLAN OF CARE S/p right foot delayed wound closure  -Continue PT nonweightbearing RLE -Dressing changed today, incision stable and healing well -OK to d/c from our standpoint, he can call our office on Monday for F/U appt  Lanae Crumbly, DPM 12/18/2020

## 2020-12-19 LAB — CBC
HCT: 27.4 % — ABNORMAL LOW (ref 39.0–52.0)
Hemoglobin: 8.7 g/dL — ABNORMAL LOW (ref 13.0–17.0)
MCH: 28.2 pg (ref 26.0–34.0)
MCHC: 31.8 g/dL (ref 30.0–36.0)
MCV: 88.7 fL (ref 80.0–100.0)
Platelets: 341 10*3/uL (ref 150–400)
RBC: 3.09 MIL/uL — ABNORMAL LOW (ref 4.22–5.81)
RDW: 13.7 % (ref 11.5–15.5)
WBC: 9 10*3/uL (ref 4.0–10.5)
nRBC: 0 % (ref 0.0–0.2)

## 2020-12-19 LAB — BASIC METABOLIC PANEL
Anion gap: 10 (ref 5–15)
BUN: 16 mg/dL (ref 6–20)
CO2: 31 mmol/L (ref 22–32)
Calcium: 8.7 mg/dL — ABNORMAL LOW (ref 8.9–10.3)
Chloride: 95 mmol/L — ABNORMAL LOW (ref 98–111)
Creatinine, Ser: 0.68 mg/dL (ref 0.61–1.24)
GFR, Estimated: >60 mL/min (ref >60)
Glucose, Bld: 190 mg/dL — ABNORMAL HIGH (ref 70–99)
Potassium: 4.5 mmol/L (ref 3.5–5.1)
Sodium: 136 mmol/L (ref 135–145)

## 2020-12-19 LAB — AEROBIC/ANAEROBIC CULTURE W GRAM STAIN (SURGICAL/DEEP WOUND)

## 2020-12-19 LAB — MAGNESIUM: Magnesium: 2 mg/dL (ref 1.7–2.4)

## 2020-12-19 LAB — GLUCOSE, CAPILLARY
Glucose-Capillary: 233 mg/dL — ABNORMAL HIGH (ref 70–99)
Glucose-Capillary: 154 mg/dL — ABNORMAL HIGH (ref 70–99)
Glucose-Capillary: 96 mg/dL (ref 70–99)
Glucose-Capillary: 227 mg/dL — ABNORMAL HIGH (ref 70–99)

## 2020-12-19 MED ORDER — CIPROFLOXACIN HCL 500 MG PO TABS
500.0000 mg | ORAL_TABLET | Freq: Two times a day (BID) | ORAL | 0 refills | Status: DC
  Filled 2020-12-19: qty 56, 28d supply, fill #0

## 2020-12-19 MED ORDER — PANTOPRAZOLE SODIUM 40 MG PO TBEC
40.0000 mg | DELAYED_RELEASE_TABLET | Freq: Every day | ORAL | 0 refills | Status: DC
  Filled 2020-12-19: qty 30, 30d supply, fill #0

## 2020-12-19 MED ORDER — AMLODIPINE BESYLATE 10 MG PO TABS
10.0000 mg | ORAL_TABLET | Freq: Every day | ORAL | 0 refills | Status: DC
  Filled 2020-12-19: qty 30, 30d supply, fill #0

## 2020-12-19 MED ORDER — CIPROFLOXACIN HCL 500 MG PO TABS
500.0000 mg | ORAL_TABLET | Freq: Two times a day (BID) | ORAL | Status: DC
  Administered 2020-12-19 – 2020-12-20 (×3): 500 mg via ORAL
  Filled 2020-12-19 (×3): qty 1

## 2020-12-19 MED ORDER — TRAMADOL HCL 50 MG PO TABS: 50.0000 mg | ORAL_TABLET | Freq: Four times a day (QID) | ORAL | 0 refills | Status: DC | PRN

## 2020-12-19 MED ORDER — INSULIN LISPRO (1 UNIT DIAL) 100 UNIT/ML (KWIKPEN)
12.0000 [IU] | PEN_INJECTOR | Freq: Three times a day (TID) | SUBCUTANEOUS | 3 refills | Status: DC
  Filled 2020-12-19: qty 15, 42d supply, fill #0

## 2020-12-19 MED ORDER — BASAGLAR KWIKPEN 100 UNIT/ML ~~LOC~~ SOPN: 12.0000 [IU] | PEN_INJECTOR | Freq: Two times a day (BID) | SUBCUTANEOUS | 1 refills | Status: DC

## 2020-12-19 MED ORDER — ACETAMINOPHEN 500 MG PO TABS: 500.0000 mg | ORAL_TABLET | Freq: Four times a day (QID) | ORAL | 0 refills | Status: AC | PRN

## 2020-12-19 MED ORDER — CIPROFLOXACIN HCL 500 MG PO TABS: 500.0000 mg | ORAL_TABLET | Freq: Two times a day (BID) | ORAL | Status: DC

## 2020-12-19 NOTE — Discharge Summary (Signed)
DISCHARGE SUMMARY  MYLAN SCHWARZ  MR#: [id number]  DOB:1970-04-12  Date of Admission: 12/11/2020 Date of Discharge: 12/19/2020  Attending Physician:Briannie Gutierrez Hennie Duos, MD  Patient's KXF:[id number], Chioma E, NP  Consults: Podiatry ID  Disposition: D/C home   Date of Positive COVID Test: 12/11/20  COVID-19 specific Treatment: None indicated   Antimicrobials:  Zosyn 10/1 Meropenem 10/2 > 10/7 Vancomycin 10/1 > 10/6 Cipro 10/8 >  Follow-up Appts:  Follow-up Information     Iloabachie, Chioma E, NP. Schedule an appointment as soon as possible for a visit in 1 week(s).   Specialty: Gerontology Contact information: [address] [place] [address] [phone]         Criselda Peaches, DPM Follow up.   Specialty: Podiatry Why: Keep your scheduled appointment as instructed by Dr. Sherryle Lis. Contact information: [place] [address] [phone]                 Tests Needing Follow-up: -assess CBG control -monitor BP -f/u anemia  -wound care per Podiatry - pt already set up with an outpatient f/u visit   Discharge Diagnoses: Diabetic foot infection with osteomyelitis Wound culture revealing Acinetobacter and Enterobacter -antibiotics per ID -debridement w/ TMA 10/3 by Dr. Sherryle Lis -successful closure of wound previously left open in OR 10/6 -cleared for discharge from a surgical standpoint 10/6 -nonweightbearing to right lower extremity -posterior splint to remain in place -dressing to remain in place until followed up by Podiatry -follow-up with Dr. Sherryle Lis in outpatient setting 1 week from discharge - plan was to transition to oral antibiotics today for discharge home but with patient now vomiting will delay discharge and continue IV antibiotics   DM2 uncontrolled with hyperglycemia Titrate insulin dosing again today with goal of CBG consistently <180   Nausea and vomiting Acute development overnight 10/7>10/8 - etiology  not clear -exam not consistent with ileus -short trial of scheduled Reglan - if persists consider KUB to rule out ileus   Anemia of chronic disease No evidence of acute blood loss -felt to be due to smoldering long-term infection -monitor intermittently   OSA Continue usual CPAP regimen   COVID virus infection Incidental finding -no indication for specific treatment presently  Initial presentation: 50yo with a history of diabetes, sarcoidosis, sleep apnea, and diabetic neuropathy who presented to the ED with complaints of worsening drainage from a known right foot wound.  He was seen in the wound care clinic 12/08/2020 and had been receiving antibiotics via a PICC line.  MRI arranged 12/07/2020 suggested osteomyelitis.  He was hospitalized 8/22 >11/16/2020 and required amputation of 2 toes for necrotizing soft tissue infection of the right foot.  Cultures at that time grew staph aureus.  He was discharged on IV antibiotics with a wound VAC initially.  He remained on antibiotic therapy through 12/12/2020, making a total of 6 weeks.    Hospital Course:  Diabetic foot infection with osteomyelitis Wound culture revealing Acinetobacter and Enterobacter -antibiotics per ID -debridement w/ TMA 10/3 by Dr. Sherryle Lis -successful closure of wound previously left open in OR 10/6 -cleared for discharge from a surgical standpoint 10/6 -nonweightbearing to right lower extremity -posterior splint to remain in place -dressing to remain in place until followed up by Podiatry -follow-up with Dr. Sherryle Lis in outpatient setting 1 week from discharge - per ID recs at time of d/c changed to quinolone for 2-4 weeks depending on wound progression and bone pathology    DM2 uncontrolled with hyperglycemia Titrated  insulin dosing frequently during admission - CBG improving at time of d/c - importance of strict DM control discussed w/ pt    Nausea and vomiting Acute development overnight 10/7>10/8 - etiology not clear -exam  not consistent with ileus -short trial of scheduled Reglan was successful w/ full resolution of sx - tolerating oral intake w/o difficulty at time of d/c    Anemia of chronic disease No evidence of acute blood loss -felt to be due to smoldering long-term infection -monitor intermittently   OSA Continue usual CPAP regimen   COVID virus infection Incidental finding - no indication for specific treatment - last day of inpatient isolation would have been 12/21/20 but as per outpt isolation guidelines pt is now safe to leave he house w/ a mask in place   Allergies as of 12/19/2020   No Known Allergies      Medication List     STOP taking these medications    ceFAZolin  IVPB Commonly known as: ANCEF   collagenase ointment Commonly known as: SANTYL   sodium chloride flush 0.9 % Soln Commonly known as: NS       TAKE these medications    acetaminophen 500 MG tablet Commonly known as: TYLENOL Take 1 tablet (500 mg total) by mouth every 6 (six) hours as needed for mild pain (or Fever >/= 101).   albuterol 108 (90 Base) MCG/ACT inhaler Commonly known as: VENTOLIN HFA Inhale 2 puffs into the lungs once every 6 (six) hours as needed for wheezing or shortness of breath.   amLODipine 10 MG tablet Commonly known as: NORVASC Take 1 tablet (10 mg total) by mouth daily. Start taking on: December 20, 2020   ascorbic acid 250 MG tablet Commonly known as: VITAMIN C Take 1 tablet (250 mg total) by mouth once daily.   Basaglar KwikPen 100 UNIT/ML Inject 12 Units into the skin 2 (two) times daily. What changed:  how much to take when to take this   ciprofloxacin 500 MG tablet Commonly known as: CIPRO Take 1 tablet (500 mg total) by mouth 2 (two) times daily for 28 days.   gabapentin 100 MG capsule Commonly known as: NEURONTIN Take 1 capsule (100 mg total) by mouth once daily at bedtime.   insulin lispro 100 UNIT/ML KwikPen Commonly known as: HumaLOG KwikPen Inject 12 Units  into the skin 3 (three) times daily before meals. 12 units into skin 3 times daily before meals, and follow your established sliding scale as well. What changed:  how much to take additional instructions   multivitamin with minerals Tabs tablet Take 1 tablet by mouth once daily.   Novofine Pen Needle 32G X 6 MM Misc Generic drug: Insulin Pen Needle USE AS DIRECTED WITH INSULIN PENS.   pantoprazole 40 MG tablet Commonly known as: PROTONIX Take 1 tablet (40 mg total) by mouth daily. Start taking on: December 20, 2020   traMADol 50 MG tablet Commonly known as: ULTRAM Take 1 tablet (50 mg total) by mouth every 6 (six) hours as needed for moderate pain.        Day of Discharge BP 129/77 (BP Location: Left Arm)   Pulse 88   Temp (!) 97.3 F (36.3 C)   Resp 18   Ht 5\' 6"  (1.676 m)   Wt 84 kg   SpO2 95%   BMI 29.89 kg/m   Physical Exam: General: No acute respiratory distress Lungs: Clear to auscultation bilaterally without wheezes or crackles Cardiovascular: Regular rate and rhythm without  murmur gallop or rub normal S1 and S2 Abdomen: Nontender, nondistended, soft, bowel sounds positive, no rebound, no ascites, no appreciable mass Extremities: No significant cyanosis, clubbing, or edema bilateral lower extremities  Basic Metabolic Panel: Recent Labs  Lab 12/14/20 0530 12/18/20 0445 12/19/20 0511  NA 134* 130* 136  K 3.9 4.1 4.5  CL 98 91* 95*  CO2 27 29 31   GLUCOSE 244* 189* 190*  BUN 12 16 16   CREATININE 0.74 0.65 0.68  CALCIUM 8.6* 8.6* 8.7*  MG  --   --  2.0    Liver Function Tests: Recent Labs  Lab 12/18/20 0445  AST 16  ALT 12  ALKPHOS 65  BILITOT 0.7  PROT 7.1  ALBUMIN 2.8*    CBC: Recent Labs  Lab 12/14/20 0530 12/15/20 0630 12/18/20 0445 12/19/20 0511  WBC 13.0* 10.4 15.2* 9.0  HGB 11.0* 9.8* 9.1* 8.7*  HCT 33.7* 32.2* 28.9* 27.4*  MCV 86.9 90.7 86.8 88.7  PLT 401* 396 362 341    CBG: Recent Labs  Lab 12/18/20 0815  12/18/20 1536 12/18/20 2029 12/19/20 0753 12/19/20 1206  GLUCAP 209* 180* 267* 233* 154*    Recent Results (from the past 240 hour(s))  Resp Panel by RT-PCR (Flu A&B, Covid) Nasopharyngeal Swab     Status: Abnormal   Collection Time: 12/11/20  9:25 PM   Specimen: Nasopharyngeal Swab; Nasopharyngeal(NP) swabs in vial transport medium  Result Value Ref Range Status   SARS Coronavirus 2 by RT PCR POSITIVE (A) NEGATIVE Final    Comment: RESULT CALLED TO, READ BACK BY AND VERIFIED WITH: JULIA SHEEHAN @ 2236 12/11/20 LFD (NOTE) SARS-CoV-2 target nucleic acids are DETECTED.  The SARS-CoV-2 RNA is generally detectable in upper respiratory specimens during the acute phase of infection. Positive results are indicative of the presence of the identified virus, but do not rule out bacterial infection or co-infection with other pathogens not detected by the test. Clinical correlation with patient history and other diagnostic information is necessary to determine patient infection status. The expected result is Negative.  Fact Sheet for Patients: EntrepreneurPulse.com.au  Fact Sheet for Healthcare Providers: IncredibleEmployment.be  This test is not yet approved or cleared by the [place] FDA and  has been authorized for detection and/or diagnosis of SARS-CoV-2 by FDA under an Emergency Use Authorization (EUA).  This EUA will remain in effect (meaning this test can be  used) for the duration of  the COVID-19 declaration under Section 564(b)(1) of the Act, 21 U.S.C. section 360bbb-3(b)(1), unless the authorization is terminated or revoked sooner.     Influenza A by PCR NEGATIVE NEGATIVE Final   Influenza B by PCR NEGATIVE NEGATIVE Final    Comment: (NOTE) The Xpert Xpress SARS-CoV-2/FLU/RSV plus assay is intended as an aid in the diagnosis of influenza from Nasopharyngeal swab specimens and should not be used as a sole basis for treatment. Nasal  washings and aspirates are unacceptable for Xpert Xpress SARS-CoV-2/FLU/RSV testing.  Fact Sheet for Patients: EntrepreneurPulse.com.au  Fact Sheet for Healthcare Providers: IncredibleEmployment.be  This test is not yet approved or cleared by the [place] FDA and has been authorized for detection and/or diagnosis of SARS-CoV-2 by FDA under an Emergency Use Authorization (EUA). This EUA will remain in effect (meaning this test can be used) for the duration of the COVID-19 declaration under Section 564(b)(1) of the Act, 21 U.S.C. section 360bbb-3(b)(1), unless the authorization is terminated or revoked.  Performed at [facility], [address] [place], [place] [address]  Anaerobic culture w Gram Stain     Status: None   Collection Time: 12/13/20  7:09 PM   Specimen: PATH Other; Tissue  Result Value Ref Range Status   Specimen Description TISSUE RIGHT FOOT  Final   Special Requests BONE AND SOFT TISSUE  Final   Gram Stain   Final    RARE WBC PRESENT, PREDOMINANTLY MONONUCLEAR FEW GRAM POSITIVE COCCI IN PAIRS RARE GRAM POSITIVE COCCI IN CLUSTERS    Culture   Final    CULTURE CREDITED SEE ACCN [id number] TLXB [id number] ORDERED INCORRECTLY Performed at [facility], [place] [address], [place], [place] [address]    Report Status 12/15/2020 FINAL  Final  Aerobic/Anaerobic Culture w Gram Stain (surgical/deep wound)     Status: None (Preliminary result)   Collection Time: 12/13/20  9:30 PM   Specimen: Bone; Tissue  Result Value Ref Range Status   Specimen Description BONE AND SIFT TISSUE  Final   Special Requests TISSUE RIGHT FOOT  Final   Gram Stain   Final    RARE WBC PRESENT,BOTH PMN AND MONONUCLEAR FEW GRAM POSITIVE COCCI IN PAIRS IN CLUSTERS FEW GRAM NEGATIVE RODS Performed at [facility], [place] [address] [place], [place] [address]    Culture   Final    FEW ENTEROBACTER CLOACAE FEW ACINETOBACTER BAUMANNII NO  ANAEROBES ISOLATED; CULTURE IN PROGRESS FOR 5 DAYS    Report Status PENDING  Incomplete   Organism ID, Bacteria ENTEROBACTER CLOACAE  Final   Organism ID, Bacteria ACINETOBACTER BAUMANNII  Final      Susceptibility   Acinetobacter baumannii - MIC*    CEFTAZIDIME 4 SENSITIVE Sensitive     CIPROFLOXACIN <=0.25 SENSITIVE Sensitive     GENTAMICIN <=1 SENSITIVE Sensitive     IMIPENEM <=0.25 SENSITIVE Sensitive     PIP/TAZO <=4 SENSITIVE Sensitive     TRIMETH/SULFA <=20 SENSITIVE Sensitive     AMPICILLIN/SULBACTAM <=2 SENSITIVE Sensitive     * FEW ACINETOBACTER BAUMANNII   Enterobacter cloacae - MIC*    CEFAZOLIN >=64 RESISTANT Resistant     CEFEPIME <=0.12 SENSITIVE Sensitive     CEFTAZIDIME <=1 SENSITIVE Sensitive     CIPROFLOXACIN <=0.25 SENSITIVE Sensitive     GENTAMICIN <=1 SENSITIVE Sensitive     IMIPENEM <=0.25 SENSITIVE Sensitive     TRIMETH/SULFA <=20 SENSITIVE Sensitive     PIP/TAZO <=4 SENSITIVE Sensitive     * FEW ENTEROBACTER CLOACAE      Time spent in discharge (includes decision making & examination of pt): 35 minutes  12/19/2020, 2:15 PM   Cherene Altes, MD [facility] Office  [phone]

## 2020-12-19 NOTE — Progress Notes (Addendum)
Physical Therapy Treatment Patient Details Name: Fernando Vega MRN: [id number] DOB: 02-15-1971 Today's Date: 12/19/2020   History of Present Illness Issac Moure is a 50yoM who comes to [facility] on 10/1 for ongoing infection of Rt foot wounds. Pt underwent Rt TMA on 10/3 with podiatry. Pt has a (+) covid test from 12/11/20.    PT Comments    Pt agrees with encouragement.  To EOB without assist.  Pt noted to be quite sweaty and gown/linens soaked.  He is able to walk NWB with RW to/from bathroom to void and for bathing.  New gown and lines given.    Encouraged and verbal review of independent of HEP.  Pt fatigued with short ambulation distances but does well maintaining NWB throughout.  Discussed use of wheelchair for general household mobility and he voiced understanding as he is aware mobility with walker is taxing for him and he would be limited with tasks/carrying objects while standing.  Discussed stairs.  He has been taught backwards with walker but previously he has been able to put some weight through foot.  Pt is very aware of NWB limitations and follows them fully.  Encouraged pt to sit and go up/down in a seated position to avoid any WB.  He thought this was the best option at this time and verbal review and instructions were given (pt covid + so no unnecessary mobility on unit is preferred.)  I do not see the need to practice stairs in a seated position at this time as pt is young and has good understanding of technique.       Recommendations for follow up therapy are one component of a multi-disciplinary discharge planning process, led by the attending physician.  Recommendations may be updated based on patient status, additional functional criteria and insurance authorization.  Follow Up Recommendations  Home health PT;Supervision - Intermittent     Equipment Recommendations  3in1 (PT)    Recommendations for Other Services       Precautions / Restrictions  Precautions Precautions: Fall Restrictions Weight Bearing Restrictions: Yes RLE Weight Bearing: Non weight bearing     Mobility  Bed Mobility Overal bed mobility: Modified Independent                  Transfers Overall transfer level: Modified independent                  Ambulation/Gait Ambulation/Gait assistance: Min guard Gait Distance (Feet): 30 Feet Assistive device: Rolling walker (2 wheeled)   Gait velocity: decreased   General Gait Details: hop to gait, maintains strict NWB throughout;given cues for safer strategy for turnarounds in stnading.   Stairs Stairs: Yes       General stair comments: verbal review.  encouraged seated position and pt voiced understanding.   Information systems manager mobility: Yes Wheelchair Assistance Details (indicate cue type and reason): encouated wheelchair use at home for mobility in the home due to NWB and he has one and agreed it best.  Modified Rankin (Stroke Patients Only)       Balance Overall balance assessment: Modified Independent;Mild deficits observed, not formally tested                                          Cognition Arousal/Alertness: Awake/alert Behavior During Therapy: WFL for tasks assessed/performed Overall Cognitive Status: Within Functional Limits for tasks assessed  Exercises      General Comments        Pertinent Vitals/Pain Pain Assessment: Faces Faces Pain Scale: Hurts a little bit Pain Location: operative foot Pain Descriptors / Indicators: Operative site guarding Pain Intervention(s): Limited activity within patient's tolerance;Monitored during session    Home Living                      Prior Function            PT Goals (current goals can now be found in the care plan section) Progress towards PT goals: Progressing toward goals    Frequency     7X/week      PT Plan Current plan remains appropriate    Co-evaluation              AM-PAC PT "6 Clicks" Mobility   Outcome Measure  Help needed turning from your back to your side while in a flat bed without using bedrails?: None Help needed moving from lying on your back to sitting on the side of a flat bed without using bedrails?: None Help needed moving to and from a bed to a chair (including a wheelchair)?: A Little Help needed standing up from a chair using your arms (e.g., wheelchair or bedside chair)?: A Little Help needed to walk in hospital room?: A Little Help needed climbing 3-5 steps with a railing? : A Little 6 Click Score: 20    End of Session Equipment Utilized During Treatment: Gait belt Activity Tolerance: Patient tolerated treatment well;No increased pain Patient left: in bed;with call bell/phone within reach;with bed alarm set Nurse Communication: Mobility status PT Visit Diagnosis: Unsteadiness on feet (R26.81);Other abnormalities of gait and mobility (R26.89);Difficulty in walking, not elsewhere classified (R26.2)     Time: 5701-7793 PT Time Calculation (min) (ACUTE ONLY): 26 min  Charges:  $Gait Training: 8-22 mins $Therapeutic Activity: 8-22 mins                    Chesley Noon, PTA 12/19/20, 11:34 AM

## 2020-12-20 ENCOUNTER — Other Ambulatory Visit: Payer: Self-pay

## 2020-12-20 LAB — GLUCOSE, CAPILLARY: Glucose-Capillary: 184 mg/dL — ABNORMAL HIGH (ref 70–99)

## 2020-12-20 NOTE — Progress Notes (Signed)
Discharge Note: Reviewed discharge instructions. Pt verbalized understanding. Pt discharged with 3-in-1. Medications from medication mgt. Pt wheeled out  by staff. Pt transported to home via family private vehicle.

## 2020-12-20 NOTE — Progress Notes (Signed)
   Patient was formally discharged 10/9 when he was medically stable for discharge home.  He remained in the hospital 1 additional night due to the fact that he was not able to procure his medications via the free pharmacy or obtain outpatient therapies if discharged on a Sunday.  Nursing staff was alerted that the patient could be discharged without waiting to see the MD as there were no outstanding medical issues.  Patient was discharged home 12/20/2020 without incident and with no changes from the plan as detailed in the summary composed and dated 12/19/2020.  Cherene Altes, MD [facility] Office  [phone]  12/20/2020, 3:47 PM

## 2020-12-20 NOTE — TOC Progression Note (Signed)
Transition of Care [facility]) - Progression Note    Patient Details  Name: Fernando Vega MRN: [id number] Date of Birth: 22-Feb-1971  Transition of Care [facility]) CM/SW [place], RN Phone Number: 12/20/2020, 9:20 AM  Clinical Narrative:     Called [facility] to confirm if medications were ready, They are working on the scripts and will call me when they are ready.  I called [facility] and requested [place] for [facility] PT, they will let me know if they are accepting the patient       Expected Discharge Plan and Services           Expected Discharge Date: 12/19/20                                     Social Determinants of Health (SDOH) Interventions    Readmission Risk Interventions No flowsheet data found.

## 2020-12-20 NOTE — TOC Progression Note (Signed)
Transition of Care [facility]) - Progression Note    Patient Details  Name: Fernando Vega MRN: [id number] Date of Birth: 1970-06-13  Transition of Care [facility]) CM/SW [place], RN Phone Number: 12/20/2020, 9:55 AM  Clinical Narrative:    Medication mgt called and stated that the patients medications are ready A volunteer will go to med mgt and pick up the medications for the patient       Expected Discharge Plan and Services           Expected Discharge Date: 12/20/20                                     Social Determinants of Health (SDOH) Interventions    Readmission Risk Interventions No flowsheet data found.

## 2020-12-22 ENCOUNTER — Other Ambulatory Visit: Payer: Self-pay

## 2020-12-22 ENCOUNTER — Encounter: Payer: Medicaid Other | Attending: Internal Medicine | Admitting: Internal Medicine

## 2020-12-22 DIAGNOSIS — M86171 Other acute osteomyelitis, right ankle and foot: Secondary | ICD-10-CM

## 2020-12-22 DIAGNOSIS — Z794 Long term (current) use of insulin: Secondary | ICD-10-CM | POA: Diagnosis not present

## 2020-12-22 DIAGNOSIS — Z89431 Acquired absence of right foot: Secondary | ICD-10-CM | POA: Insufficient documentation

## 2020-12-22 DIAGNOSIS — E11621 Type 2 diabetes mellitus with foot ulcer: Secondary | ICD-10-CM | POA: Diagnosis not present

## 2020-12-22 DIAGNOSIS — S91301D Unspecified open wound, right foot, subsequent encounter: Secondary | ICD-10-CM | POA: Diagnosis present

## 2020-12-22 DIAGNOSIS — L97519 Non-pressure chronic ulcer of other part of right foot with unspecified severity: Secondary | ICD-10-CM | POA: Diagnosis not present

## 2020-12-22 DIAGNOSIS — F17200 Nicotine dependence, unspecified, uncomplicated: Secondary | ICD-10-CM | POA: Insufficient documentation

## 2020-12-22 NOTE — Progress Notes (Signed)
THEADORE, BLUNCK ([id number]) Visit Report for 12/22/2020 Arrival Information Details Patient Name: Fernando Vega, Fernando Vega. Date of Service: 12/22/2020 1:00 PM Medical Record Number: [id number] Patient Account Number: [id number] Date of Birth/Sex: 11/17/1970 (50 y.o. M) Treating RN: Donnamarie Poag Primary Care Luca Burston: Caryl Asp Other Clinician: Referring Kollyn Lingafelter: Loletha Grayer Treating Myesha Stillion/Extender: Yaakov Guthrie in Treatment: 3 Visit Information History Since Last Visit Added or deleted any medications: Yes Patient Arrived: Ambulatory Had a fall or experienced change in No Arrival Time: 13:03 activities of daily living that may affect Accompanied By: self risk of falls: Transfer Assistance: None Hospitalized since last visit: Yes Patient Identification Verified: Yes Has Dressing in Place as Prescribed: Yes Secondary Verification Process Completed: Yes Has Compression in Place as Prescribed: Yes Patient Has Alerts: Yes Pain Present Now: Yes Patient Alerts: DIABETIC PICC right arm follows with ID Electronic Signature(s) Signed: 12/22/2020 3:03:55 PM By: Donnamarie Poag Entered By: Donnamarie Poag on 12/22/2020 13:05:05 Fernando Vega ([id number]) -------------------------------------------------------------------------------- Clinic Level of Care Assessment Details Patient Name: Fernando Vega. Date of Service: 12/22/2020 1:00 PM Medical Record Number: [id number] Patient Account Number: [id number] Date of Birth/Sex: Jun 22, 1970 (50 y.o. M) Treating RN: Donnamarie Poag Primary Care Coron Rossano: Caryl Asp Other Clinician: Referring Enora Trillo: Loletha Grayer Treating Joanna Borawski/Extender: Yaakov Guthrie in Treatment: 3 Clinic Level of Care Assessment Items TOOL 4 Quantity Score []  - Use when only an EandM is performed on FOLLOW-UP visit 0 ASSESSMENTS - Nursing Assessment / Reassessment []  - Reassessment of Co-morbidities (includes updates in patient status)  0 []  - 0 Reassessment of Adherence to Treatment Plan ASSESSMENTS - Wound and Skin Assessment / Reassessment X - Simple Wound Assessment / Reassessment - one wound 1 5 []  - 0 Complex Wound Assessment / Reassessment - multiple wounds X- 1 10 Dermatologic / Skin Assessment (not related to wound area) ASSESSMENTS - Focused Assessment []  - Circumferential Edema Measurements - multi extremities 0 []  - 0 Nutritional Assessment / Counseling / Intervention []  - 0 Lower Extremity Assessment (monofilament, tuning fork, pulses) []  - 0 Peripheral Arterial Disease Assessment (using hand held doppler) ASSESSMENTS - Ostomy and/or Continence Assessment and Care []  - Incontinence Assessment and Management 0 []  - 0 Ostomy Care Assessment and Management (repouching, etc.) PROCESS - Coordination of Care X - Simple Patient / Family Education for ongoing care 1 15 []  - 0 Complex (extensive) Patient / Family Education for ongoing care X- 1 10 Staff obtains Programmer, systems, Records, Test Results / Process Orders []  - 0 Staff telephones HHA, Nursing Homes / Clarify orders / etc []  - 0 Routine Transfer to another Facility (non-emergent condition) []  - 0 Routine Hospital Admission (non-emergent condition) []  - 0 New Admissions / Biomedical engineer / Ordering NPWT, Apligraf, etc. []  - 0 Emergency Hospital Admission (emergent condition) X- 1 10 Simple Discharge Coordination []  - 0 Complex (extensive) Discharge Coordination PROCESS - Special Needs []  - Pediatric / Minor Patient Management 0 []  - 0 Isolation Patient Management []  - 0 Hearing / Language / Visual special needs []  - 0 Assessment of Community assistance (transportation, D/C planning, etc.) []  - 0 Additional assistance / Altered mentation []  - 0 Support Surface(s) Assessment (bed, cushion, seat, etc.) INTERVENTIONS - Wound Cleansing / Measurement Fernando Vega, Fernando R. ([id number]) X- 1 5 Simple Wound Cleansing - one wound []  - 0 Complex  Wound Cleansing - multiple wounds X- 1 5 Wound Imaging (photographs - any number of wounds) []  - 0 Wound Tracing (instead of photographs) X- 1 5 Simple Wound  Measurement - one wound []  - 0 Complex Wound Measurement - multiple wounds INTERVENTIONS - Wound Dressings []  - Small Wound Dressing one or multiple wounds 0 []  - 0 Medium Wound Dressing one or multiple wounds X- 1 20 Large Wound Dressing one or multiple wounds []  - 0 Application of Medications - topical []  - 0 Application of Medications - injection INTERVENTIONS - Miscellaneous []  - External ear exam 0 []  - 0 Specimen Collection (cultures, biopsies, blood, body fluids, etc.) []  - 0 Specimen(s) / Culture(s) sent or taken to Lab for analysis []  - 0 Patient Transfer (multiple staff / Civil Service fast streamer / Similar devices) []  - 0 Simple Staple / Suture removal (25 or less) []  - 0 Complex Staple / Suture removal (26 or more) []  - 0 Hypo / Hyperglycemic Management (close monitor of Blood Glucose) []  - 0 Ankle / Brachial Index (ABI) - do not check if billed separately X- 1 5 Vital Signs Has the patient been seen at the hospital within the last three years: Yes Total Score: 90 Level Of Care: New/Established - Level 3 Electronic Signature(s) Signed: 12/22/2020 3:03:55 PM By: Donnamarie Poag Entered By: Donnamarie Poag on 12/22/2020 13:52:26 Fernando Vega ([id number]) -------------------------------------------------------------------------------- Encounter Discharge Information Details Patient Name: Fernando Vega. Date of Service: 12/22/2020 1:00 PM Medical Record Number: [id number] Patient Account Number: [id number] Date of Birth/Sex: [birth year] (50 y.o. M) Treating RN: Donnamarie Poag Primary Care Trygve Thal: Caryl Asp Other Clinician: Referring Judieth Mckown: Loletha Grayer Treating Vernell Back/Extender: Yaakov Guthrie in Treatment: 3 Encounter Discharge Information Items Discharge Condition: Stable Ambulatory Status:  Knee Scooter Discharge Destination: Home Transportation: Private Auto Accompanied By: self Schedule Follow-up Appointment: Yes Clinical Summary of Care: Electronic Signature(s) Signed: 12/22/2020 3:03:55 PM By: Donnamarie Poag Entered By: Donnamarie Poag on 12/22/2020 14:00:10 Fernando Vega ([id number]) -------------------------------------------------------------------------------- Lower Extremity Assessment Details Patient Name: Fernando Vega. Date of Service: 12/22/2020 1:00 PM Medical Record Number: [id number] Patient Account Number: [id number] Date of Birth/Sex: 23-Jan-1971 (50 y.o. M) Treating RN: Donnamarie Poag Primary Care Lyla Jasek: Caryl Asp Other Clinician: Referring Nalin Mazzocco: Loletha Grayer Treating Meshia Rau/Extender: Yaakov Guthrie in Treatment: 3 Edema Assessment Assessed: [Left: No] [Right: Yes] Edema: [Left: N] [Right: o] Notes post op transmet amp/appt with podiatry 12/23/20, but chose to keep appt today Electronic Signature(s) Signed: 12/22/2020 3:03:55 PM By: Donnamarie Poag Entered By: Donnamarie Poag on 12/22/2020 13:28:30 Fernando Vega ([id number]) -------------------------------------------------------------------------------- Multi Wound Chart Details Patient Name: Fernando Vega. Date of Service: 12/22/2020 1:00 PM Medical Record Number: [id number] Patient Account Number: [id number] Date of Birth/Sex: 01-05-1971 (50 y.o. M) Treating RN: Donnamarie Poag Primary Care Islam Villescas: Caryl Asp Other Clinician: Referring Ashly Yepez: Loletha Grayer Treating Lynn Sissel/Extender: Yaakov Guthrie in Treatment: 3 Vital Signs Height(in): 67 Pulse(bpm): 96 Weight(lbs): 183 Blood Pressure(mmHg): 127/88 Body Mass Index(BMI): 29 Temperature(F): 97.9 Respiratory Rate(breaths/min): 16 Photos: [N/A:N/A] Wound Location: Right, Distal Foot N/A N/A Wounding Event: Puncture N/A N/A Primary Etiology: Open Surgical Wound N/A N/A Secondary Etiology: Diabetic  Wound/Ulcer of the Lower N/A N/A Extremity Comorbid History: Anemia, Sleep Apnea, Type II N/A N/A Diabetes Date Acquired: 10/11/2020 N/A N/A Weeks of Treatment: 3 N/A N/A Wound Status: Amputation - Anticipated N/A N/A Pending Amputation on Yes N/A N/A Presentation: Measurements L x W x D (cm) 0x0x0 N/A N/A Area (cm) : 0 N/A N/A Volume (cm) : 0 N/A N/A % Reduction in Area: 100.00% N/A N/A % Reduction in Volume: 100.00% N/A N/A Classification: Full Thickness With Exposed N/A N/A Support Structures Exudate Amount: Large N/A  N/A Exudate Type: Serosanguineous N/A N/A Exudate Color: red, brown N/A N/A Wound Margin: Thickened N/A N/A Granulation Amount: None Present (0%) N/A N/A Necrotic Amount: None Present (0%) N/A N/A Treatment Notes Electronic Signature(s) Fernando Vega, Fernando Vega ([id number]) Signed: 12/22/2020 2:51:01 PM By: Kalman Shan DO Entered By: Kalman Shan on 12/22/2020 14:09:40 Fernando Vega ([id number]) -------------------------------------------------------------------------------- [place] Details Patient Name: Fernando Vega. Date of Service: 12/22/2020 1:00 PM Medical Record Number: [id number] Patient Account Number: [id number] Date of Birth/Sex: 1970/05/07 (50 y.o. M) Treating RN: Donnamarie Poag Primary Care Cherysh Epperly: Caryl Asp Other Clinician: Referring Bonny Egger: Loletha Grayer Treating Cherylee Rawlinson/Extender: Yaakov Guthrie in Treatment: 3 Active Inactive Electronic Signature(s) Signed: 12/22/2020 3:03:55 PM By: Donnamarie Poag Entered By: Donnamarie Poag on 12/22/2020 13:29:02 Fernando Vega ([id number]) -------------------------------------------------------------------------------- Pain Assessment Details Patient Name: Fernando Vega. Date of Service: 12/22/2020 1:00 PM Medical Record Number: [id number] Patient Account Number: [id number] Date of Birth/Sex: August 29, 1970 (50 y.o. M) Treating RN: Donnamarie Poag Primary Care Hollie Bartus:  Caryl Asp Other Clinician: Referring Carrel Leather: Loletha Grayer Treating Augustine Brannick/Extender: Yaakov Guthrie in Treatment: 3 Active Problems Location of Pain Severity and Description of Pain Patient Has Paino Yes Site Locations Pain Location: Pain in Ulcers Rate the pain. Current Pain Level: 3 Pain Management and Medication Current Pain Management: Electronic Signature(s) Signed: 12/22/2020 3:03:55 PM By: Donnamarie Poag Entered By: Donnamarie Poag on 12/22/2020 13:07:14 Fernando Vega ([id number]) -------------------------------------------------------------------------------- Patient/Caregiver Education Details Patient Name: Fernando Vega. Date of Service: 12/22/2020 1:00 PM Medical Record Number: [id number] Patient Account Number: [id number] Date of Birth/Gender: 1970/11/11 (50 y.o. M) Treating RN: Donnamarie Poag Primary Care Physician: Caryl Asp Other Clinician: Referring Physician: Loletha Grayer Treating Physician/Extender: Yaakov Guthrie in Treatment: 3 Education Assessment Education Provided To: Patient Education Topics Provided Basic Hygiene: Wound/Skin Impairment: Electronic Signature(s) Signed: 12/22/2020 3:03:55 PM By: Donnamarie Poag Entered By: Donnamarie Poag on 12/22/2020 13:52:39 Fernando Vega ([id number]) -------------------------------------------------------------------------------- Wound Assessment Details Patient Name: Fernando Vega. Date of Service: 12/22/2020 1:00 PM Medical Record Number: [id number] Patient Account Number: [id number] Date of Birth/Sex: Oct 20, 1970 (50 y.o. M) Treating RN: Donnamarie Poag Primary Care Yaniyah Koors: Caryl Asp Other Clinician: Referring Printice Hellmer: Loletha Grayer Treating Laval Cafaro/Extender: Yaakov Guthrie in Treatment: 3 Wound Status Wound Number: 1 Primary Etiology: Open Surgical Wound Wound Location: Right, Distal Foot Secondary Etiology: Diabetic Wound/Ulcer of the Lower  Extremity Wounding Event: Puncture Wound Status: Amputation - Anticipated Date Acquired: 10/11/2020 Outcome Level: Transmetatarsal Weeks Of Treatment: 3 Comorbid History: Anemia, Sleep Apnea, Type II Diabetes Clustered Wound: No Pending Amputation On Presentation Photos Wound Measurements Length: (cm) 0 Width: (cm) 0 Depth: (cm) 0 Area: (cm) Volume: (cm) % Reduction in Area: 100% % Reduction in Volume: 100% Tunneling: No 0 Undermining: No 0 Wound Description Classification: Full Thickness With Exposed Support Structures Wound Margin: Thickened Exudate Amount: Large Exudate Type: Serosanguineous Exudate Color: red, brown Foul Odor After Cleansing: No Slough/Fibrino No Wound Bed Granulation Amount: None Present (0%) Necrotic Amount: None Present (0%) Electronic Signature(s) Signed: 12/22/2020 3:03:55 PM By: Donnamarie Poag Entered By: Donnamarie Poag on 12/22/2020 13:25:15 Fernando Vega ([id number]) -------------------------------------------------------------------------------- Vitals Details Patient Name: Fernando Vega. Date of Service: 12/22/2020 1:00 PM Medical Record Number: [id number] Patient Account Number: [id number] Date of Birth/Sex: September 04, 1970 (50 y.o. M) Treating RN: Donnamarie Poag Primary Care Emmaly Leech: Caryl Asp Other Clinician: Referring Ulys Favia: Loletha Grayer Treating Quintessa Simmerman/Extender: Yaakov Guthrie in Treatment: 3 Vital Signs Time Taken: 15:05 Temperature (F): 97.9 Height (in): 67 Pulse (bpm): 96 Weight (lbs):  183 Respiratory Rate (breaths/min): 16 Body Mass Index (BMI): 28.7 Blood Pressure (mmHg): 127/88 Reference Range: 80 - 120 mg / dl Electronic Signature(s) Signed: 12/22/2020 3:03:55 PM By: Donnamarie Poag Entered ByDonnamarie Poag on 12/22/2020 13:06:51

## 2020-12-22 NOTE — Progress Notes (Signed)
Fernando Vega ([id number]) Visit Report for 12/22/2020 Chief Complaint Document Details Patient Name: Fernando Vega, Fernando Vega. Date of Service: 12/22/2020 1:00 PM Medical Record Number: [id number] Patient Account Number: [id number] Date of Birth/Sex: 30-Jul-1970 (50 y.o. 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 Date of Service: 12/22/2020 1:00 PM Medical Record Number: [id number] Patient Account Number: [id number] Date of Birth/Sex: [birth year] (50 y.o. M) Treating RN: Donnamarie Poag Primary Care Provider: Caryl Asp Other Clinician: Referring Provider: Loletha Grayer Treating Provider/Extender: Yaakov Guthrie in Treatment: 3 History of Present Illness HPI Description: Admission 12/01/2020 Mr. [name] is a 50 year old male with a past medical history of uncontrolled type 2 diabetes on insulin that presents to the clinic for a right foot wound. On 8/22 patient was admitted into the hospital for necrotizing cellulitis due to a puncture wound. He had an amputation of the second and third ray. Bone culture showed acute osteomyelitis. He was placed on Ancef for 6 weeks. He has been using Betadine to the wound bed. He reports stability in wound healing. Patient's hemoglobin A1c is 11.5. Patient currently denies systemic signs of infection. 9/28; patient presents for 1 week follow-up. He obtained  Santyl and has been using Santyl and silver alginate to the wound bed. He recently saw infectious disease, Dr. Steva Ready. He states she took a culture at that time. He states he is on his last dose of clindamycin prescribed at last clinic visit by [place]. He currently denies systemic signs of infection. 10/12; patient presents after surgery. He states he started feeling unwell with increased redness to his right foot and he went to the ED. He subsequently had a transmetatarsal amputation by his podiatrist Dr. Posey Pronto to this area due to worsening infection. He was discharged from the hospital on 10/9 and has had his dressing in place since then. It appears that this is an ABD pad with Kerlix. He currently denies signs of infection. He has follow-up with Dr. Posey Pronto tomorrow. Electronic Signature(s) Signed: 12/22/2020 2:51:01 PM By: Kalman Shan DO Entered By: Kalman Shan on 12/22/2020 14:11:58 Fernando Vega ([id number]) -------------------------------------------------------------------------------- Physical Exam Details Patient Name: Fernando Vega Date of Service: 12/22/2020 1:00 PM Medical Record Number: [id number] Patient Account Number: [id number] Date of Birth/Sex: 08/31/1970 (50 y.o. M) Treating RN: Donnamarie Poag Primary Care Provider: Caryl Asp Other Clinician: Referring Provider: Loletha Grayer Treating Provider/Extender: Yaakov Guthrie in Treatment: 3 Constitutional . Psychiatric . Notes Transmetatarsal amputation of the right foot with sutures in place. Serosanguineous drainage. No obvious signs of infection. Electronic Signature(s) Signed: 12/22/2020 2:51:01 PM By: Kalman Shan DO Entered By: Kalman Shan on 12/22/2020 14:14:35 Fernando Vega ([id number]) -------------------------------------------------------------------------------- Physician Orders Details Patient Name: Fernando Vega. Date of Service: 12/22/2020 1:00 PM Medical Record  Number: [id number] Patient Account Number: [id number] Date of Birth/Sex: 1970/12/28 (50 y.o. M) Treating RN: Donnamarie Poag Primary Care Provider: Caryl Asp Other Clinician: Referring Provider: Loletha Grayer Treating Provider/Extender: Yaakov Guthrie in Treatment: 3 Verbal / Phone Orders: No Diagnosis Coding Edema Control - Lymphedema / Segmental Compressive Device / Other o Elevate leg(s) parallel to the floor when  sitting. o DO YOUR BEST to sleep in the bed at night. DO NOT sleep in your recliner. Long hours of sitting in a recliner leads to swelling of the legs and/or potential wounds on your backside. Additional Orders / Instructions o Follow Nutritious Diet and Increase Protein Intake o Other: - Follow with podiatry as ordered post op; 12/23/20 Electronic Signature(s) Signed: 12/22/2020 2:51:01 PM By: Kalman Shan DO Signed: 12/22/2020 3:03:55 PM By: Donnamarie Poag Entered By: Donnamarie Poag on 12/22/2020 13:59:38 Fernando Vega ([id number]) -------------------------------------------------------------------------------- Problem List Details Patient Name: Fernando Vega. Date of Service: 12/22/2020 1:00 PM Medical Record Number: [id number] Patient Account Number: [id number] Date of Birth/Sex: [birth year] (50 y.o. M) Treating RN: Donnamarie Poag Primary Care Provider: Caryl Asp Other Clinician: Referring Provider: Loletha Grayer Treating Provider/Extender: Yaakov Guthrie in Treatment: 3 Active Problems ICD-10 Encounter Code Description Active Date MDM Diagnosis S91.301D Unspecified open wound, right foot, subsequent encounter 12/08/2020 No Yes M86.171 Other acute osteomyelitis, right ankle and foot 12/01/2020 No Yes E11.621 Type 2 diabetes mellitus with foot ulcer 12/01/2020 No Yes Inactive Problems ICD-10 Code Description Active Date Inactive Date S91.301A Unspecified open wound, right foot, initial encounter 12/01/2020 12/01/2020 Resolved  Problems Electronic Signature(s) Signed: 12/22/2020 2:51:01 PM By: Kalman Shan DO Entered By: Kalman Shan on 12/22/2020 14:09:11 Fernando Vega ([id number]) -------------------------------------------------------------------------------- Progress Note Details Patient Name: Fernando Vega. Date of Service: 12/22/2020 1:00 PM Medical Record Number: [id number] Patient Account Number: [id number] Date of Birth/Sex: 02-Aug-1970 (50 y.o. 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 Hulet Ehrmann is a 50 year old male with a past medical history of uncontrolled type 2 diabetes on insulin that presents to the clinic for a right foot wound. On 8/22 patient was admitted into the hospital for necrotizing cellulitis due to a puncture wound. He had an amputation of the second and third ray. Bone culture showed acute osteomyelitis. He was placed on Ancef for 6 weeks. He has been using Betadine to the wound bed. He reports stability in wound healing. Patient's hemoglobin A1c is 11.5. Patient currently denies systemic signs of infection. 9/28; patient presents for 1 week follow-up. He obtained Santyl and has been using Santyl and silver alginate to the wound bed. He recently saw infectious disease, Dr. Steva Ready. He states she took a culture at that time. He states he is on his last dose of clindamycin prescribed at last clinic visit by [place]. He currently denies systemic signs of infection. 10/12; patient presents after surgery. He states he started feeling unwell with increased redness to his right foot and he went to the ED.  He subsequently had a transmetatarsal amputation by his podiatrist Dr. Posey Pronto to this area due to worsening infection. He was discharged from the hospital on 10/9 and has had his dressing in place since then. It appears that this is an ABD pad with Kerlix. He currently denies signs of infection. He has follow-up with Dr. Posey Pronto tomorrow. Patient History Information obtained from Patient. Social History Never smoker, Marital Status - Married, Alcohol Use - Never, Drug Use - No History, Caffeine Use - Never. Medical History Hematologic/Lymphatic Patient has history of Anemia Respiratory Patient has history of Sleep Apnea Endocrine Patient has history of Type II Diabetes  Objective Constitutional Vitals Time Taken: 3:05 PM, Height: 67 in, Weight: 183 lbs, BMI: 28.7, Temperature: 97.9 F, Pulse: 96 bpm, Respiratory Rate: 16 breaths/min, Blood Pressure: 127/88 mmHg. General Notes: Transmetatarsal amputation of the right foot with sutures in place. Serosanguineous drainage. No obvious signs of infection. Integumentary (Hair, Skin) Wound #1 status is Amputation - Anticipated. Original cause of wound was Puncture. The date acquired was: 10/11/2020. The wound has been in treatment 3 weeks. The wound is located on the Right,Distal Foot. The wound measures 0cm length x 0cm width x 0cm depth; 0cm^2 area and 0cm^3 volume. There is no tunneling or undermining noted. There is a large amount of serosanguineous drainage noted. The wound margin is thickened. There is no granulation within the wound bed. There is no necrotic tissue within the wound bed. JOAL, EAKLE ([id number]) Assessment Active Problems ICD-10 Unspecified open wound, right foot, subsequent encounter Other acute osteomyelitis, right ankle and foot Type 2 diabetes mellitus with foot ulcer Patient had a transmetatarsal amputation on 12/16/2020 by his podiatrist Dr. Posey Pronto for worsening infection to his previous surgical wound  site. Currently the wound is closed by sutures. He is on ciprofloxacin for 30 days for culture growing Enterobacter cloacae and actinobacter baumannii. Currently no obvious signs of infection. He has follow-up with Dr. Posey Pronto tomorrow. I am happy to follow the patient for wound care. I asked the patient to give our office a call if he will be continuing with's [place] or with Dr. Posey Pronto. Plan Edema Control - Lymphedema / Segmental Compressive Device / Other: Elevate leg(s) parallel to the floor when sitting. DO YOUR BEST to sleep in the bed at night. DO NOT sleep in your recliner. Long hours of sitting in a recliner leads to swelling of the legs and/or potential wounds on your backside. Additional Orders / Instructions: Follow Nutritious Diet and Increase Protein Intake Other: - Follow with podiatry as ordered post op; 12/23/20 1. Bactroban ointment over the incision site with zetuvit wrapped by Kerlix 2. Follow-up as needed Electronic Signature(s) Signed: 12/22/2020 2:51:01 PM By: Kalman Shan DO Entered By: Kalman Shan on 12/22/2020 14:21:50 Fernando Vega ([id number]) -------------------------------------------------------------------------------- ROS/PFSH Details Patient Name: Fernando Vega Date of Service: 12/22/2020 1:00 PM Medical Record Number: [id number] Patient Account Number: [id number] Date of Birth/Sex: 10-13-1970 (50 y.o. M) Treating RN: Donnamarie Poag Primary Care Provider: Caryl Asp Other Clinician: Referring Provider: Loletha Grayer Treating Provider/Extender: Yaakov Guthrie in Treatment: 3 Information Obtained From Patient Hematologic/Lymphatic Medical History: Positive for: Anemia Respiratory Medical History: Positive for: Sleep Apnea Endocrine Medical History: Positive for: Type II Diabetes Time with diabetes: 30 years Treated with: Insulin, Oral agents Blood sugar tested every day: Yes Tested : Blood sugar testing results: Breakfast:  284 Immunizations Pneumococcal Vaccine: Received Pneumococcal Vaccination: No Implantable Devices None Family and Social History Never smoker; Marital Status - Married; Alcohol Use: Never; Drug Use: No History; Caffeine Use: Never; Financial Concerns: No; Food, Clothing or Shelter Needs: No; Support System Lacking: No; Transportation Concerns: No Electronic Signature(s) Signed: 12/22/2020 2:51:01 PM By: Kalman Shan DO Signed: 12/22/2020 3:03:55 PM By: Donnamarie Poag Entered By: Kalman Shan on 12/22/2020 14:12:05 Fernando Vega ([id number]) -------------------------------------------------------------------------------- [facility] Details Patient Name: Fernando Vega. Date of Service: 12/22/2020 Medical Record Number: [id number] Patient Account Number: [id number] Date of Birth/Sex: 1970/09/30 (50 y.o. M) Treating RN: Donnamarie Poag Primary Care Provider: Caryl Asp Other Clinician: Referring Provider: Loletha Grayer Treating Provider/Extender: Yaakov Guthrie in Treatment: 3 Diagnosis Coding ICD-10 Codes Code Description  S91.301D Unspecified open wound, right foot, subsequent encounter M86.171 Other acute osteomyelitis, right ankle and foot E11.621 Type 2 diabetes mellitus with foot ulcer Facility Procedures CPT4 Code: [id number] Description: 94320 - WOUND CARE VISIT-LEV 3 EST PT Modifier: Quantity: 1 Physician Procedures CPT4 Code: 0379444 Description: 61901 - WC PHYS LEVEL 3 - EST PT Modifier: Quantity: 1 CPT4 Code: Description: ICD-10 Diagnosis Description Q22.411O Unspecified open wound, right foot, subsequent encounter M86.171 Other acute osteomyelitis, right ankle and foot E11.621 Type 2 diabetes mellitus with foot ulcer Modifier: Quantity: Electronic Signature(s) Signed: 12/22/2020 2:51:01 PM By: Kalman Shan DO Entered By: Kalman Shan on 12/22/2020 14:22:04

## 2020-12-23 ENCOUNTER — Ambulatory Visit (INDEPENDENT_AMBULATORY_CARE_PROVIDER_SITE_OTHER): Payer: Self-pay | Admitting: Podiatry

## 2020-12-23 DIAGNOSIS — Z89431 Acquired absence of right foot: Secondary | ICD-10-CM

## 2020-12-24 ENCOUNTER — Telehealth: Payer: Self-pay | Admitting: Podiatry

## 2020-12-24 ENCOUNTER — Telehealth: Payer: Self-pay | Admitting: *Deleted

## 2020-12-24 MED ORDER — DOXYCYCLINE HYCLATE 100 MG PO TABS: 100.0000 mg | ORAL_TABLET | Freq: Two times a day (BID) | ORAL | 0 refills | Status: DC

## 2020-12-24 NOTE — Telephone Encounter (Signed)
Call and said he didn't get his medication sent to the pharmacy at [facility] on [address]

## 2020-12-24 NOTE — Telephone Encounter (Signed)
"  I was supposed to have a prescription called in yesterday by Dr. Posey Pronto.  I haven't heard nothing.  I need to get that prescription for the antibiotic.  I'd greatly appreciate it if you would give me a call back."

## 2020-12-24 NOTE — Telephone Encounter (Signed)
He didn't get his medication sent to the pharmacy it needs to go to the one at [place]  on [address]

## 2020-12-28 NOTE — Progress Notes (Signed)
Subjective:  Patient ID: Fernando Vega, male    DOB: [birth year],  MRN: [id number]  Chief Complaint  Patient presents with   Routine Post Op    10.6.22 wound     DOS: 12/16/2020 Procedure:  transmetatarsal amputation right foot  50 y.o. male returns for post-op check.  Patient states he is doing well.  He does not have any pain.  He has been nonweightbearing to the right lower extremity.  However he has been putting some weight on his foot.  He denies any other acute complaints  Review of Systems: Negative except as noted in the HPI. Denies N/V/F/Ch.  Past Medical History:  Diagnosis Date   Diabetes mellitus without complication ([place])    Neuropathy    Sarcoidosis    Sleep apnea     Current Outpatient Medications:    acetaminophen (TYLENOL) 500 MG tablet, Take 1 tablet (500 mg total) by mouth every 6 (six) hours as needed for mild pain (or Fever >/= 101)., Disp: 30 tablet, Rfl: 0   albuterol (VENTOLIN HFA) 108 (90 Base) MCG/ACT inhaler, Inhale 2 puffs into the lungs once every 6 (six) hours as needed for wheezing or shortness of breath., Disp: 6.7 g, Rfl: 0   amLODipine (NORVASC) 10 MG tablet, Take 1 tablet (10 mg total) by mouth once daily., Disp: 30 tablet, Rfl: 0   ascorbic acid (VITAMIN C) 250 MG tablet, Take 1 tablet (250 mg total) by mouth once daily., Disp: 30 tablet, Rfl: 0   ciprofloxacin (CIPRO) 500 MG tablet, Take 1 tablet (500 mg total) by mouth 2 (two) times daily for 28 days., Disp: 56 tablet, Rfl: 0   doxycycline (VIBRA-TABS) 100 MG tablet, Take 1 tablet (100 mg total) by mouth 2 (two) times daily., Disp: 28 tablet, Rfl: 0   gabapentin (NEURONTIN) 100 MG capsule, Take 1 capsule (100 mg total) by mouth once daily at bedtime., Disp: 30 capsule, Rfl: 0   Insulin Glargine (BASAGLAR KWIKPEN) 100 UNIT/ML, Inject 12 Units into the skin 2 (two) times daily., Disp: 15 mL, Rfl: 1   insulin lispro (HUMALOG KWIKPEN) 100 UNIT/ML KwikPen, Inject 12 Units into the skin 3 (three) times  daily before meals and follow your established sliding scale as well., Disp: 15 mL, Rfl: 3   Insulin Pen Needle 32G X 6 MM MISC, USE AS DIRECTED WITH INSULIN PENS., Disp: 100 each, Rfl: 1   Multiple Vitamin (MULTIVITAMIN WITH MINERALS) TABS tablet, Take 1 tablet by mouth once daily., Disp: 30 tablet, Rfl: 0   pantoprazole (PROTONIX) 40 MG tablet, Take 1 tablet (40 mg total) by mouth once daily., Disp: 30 tablet, Rfl: 0   traMADol (ULTRAM) 50 MG tablet, Take 1 tablet (50 mg total) by mouth every 6 (six) hours as needed for moderate pain., Disp: 30 tablet, Rfl: 0  Social History   Tobacco Use  Smoking Status Never  Smokeless Tobacco Never    No Known Allergies Objective:  There were no vitals filed for this visit. There is no height or weight on file to calculate BMI. Constitutional Well developed. Well nourished.  Vascular Foot warm and well perfused. Capillary refill normal to all digits.   Neurologic Normal speech. Oriented to person, place, and time. Epicritic sensation to light touch grossly present bilaterally.  Dermatologic Skin healing well without signs of infection. Skin edges well coapted without signs of infection.  Orthopedic: Tenderness to palpation noted about the surgical site.   Radiographs: None Assessment:   1. S/P transmetatarsal amputation of  foot, right [facility])    Plan:  Patient was evaluated and treated and all questions answered.  S/p foot surgery right -Progressing as expected post-operatively. -XR: None -WB Status: Nonweightbearing to the right lower extremity -Sutures: Intact.  Superficial dehiscence noted.  No complication noted. -Medications: None -Foot redressed.  No follow-ups on file.

## 2020-12-29 ENCOUNTER — Encounter: Payer: Medicaid Other | Admitting: Internal Medicine

## 2020-12-30 ENCOUNTER — Other Ambulatory Visit: Payer: Self-pay

## 2020-12-30 ENCOUNTER — Ambulatory Visit: Payer: Self-pay | Admitting: Gerontology

## 2020-12-30 VITALS — BP 156/79 | HR 102 | Resp 16 | Wt 193.8 lb

## 2020-12-30 DIAGNOSIS — E1142 Type 2 diabetes mellitus with diabetic polyneuropathy: Secondary | ICD-10-CM

## 2020-12-30 DIAGNOSIS — I10 Essential (primary) hypertension: Secondary | ICD-10-CM | POA: Insufficient documentation

## 2020-12-30 DIAGNOSIS — K219 Gastro-esophageal reflux disease without esophagitis: Secondary | ICD-10-CM

## 2020-12-30 MED ORDER — GABAPENTIN 300 MG PO CAPS
300.0000 mg | ORAL_CAPSULE | Freq: Every day | ORAL | 2 refills | Status: DC
  Filled 2020-12-30 – 2021-01-03 (×2): qty 30, 30d supply, fill #0

## 2020-12-30 MED ORDER — BASAGLAR KWIKPEN 100 UNIT/ML ~~LOC~~ SOPN: 15.0000 [IU] | PEN_INJECTOR | Freq: Every day | SUBCUTANEOUS | 3 refills | Status: DC

## 2020-12-30 MED ORDER — AMLODIPINE BESYLATE 10 MG PO TABS
10.0000 mg | ORAL_TABLET | Freq: Every day | ORAL | 2 refills | Status: DC
  Filled 2020-12-30 – 2021-01-28 (×2): qty 30, 30d supply, fill #0

## 2020-12-30 MED ORDER — INSULIN LISPRO (1 UNIT DIAL) 100 UNIT/ML (KWIKPEN)
10.0000 [IU] | PEN_INJECTOR | Freq: Three times a day (TID) | SUBCUTANEOUS | 3 refills | Status: DC
  Filled 2020-12-30: qty 15, 50d supply, fill #0

## 2020-12-30 MED ORDER — PANTOPRAZOLE SODIUM 40 MG PO TBEC
40.0000 mg | DELAYED_RELEASE_TABLET | Freq: Every day | ORAL | 2 refills | Status: AC
  Filled 2020-12-30 – 2021-01-28 (×2): qty 30, 30d supply, fill #0

## 2020-12-30 MED ORDER — INSULIN PEN NEEDLE 32G X 6 MM MISC
1.0000 | Freq: Three times a day (TID) | 1 refills | Status: DC
  Filled 2020-12-30: qty 100, 25d supply, fill #0

## 2020-12-30 NOTE — Progress Notes (Signed)
Established Patient Office Visit  Subjective:  Patient ID: Fernando Vega, male    DOB: 07-May-1970  Age: 50 y.o. MRN: [id number]  CC: No chief complaint on file.   HPI [name] is a 50 y/o male who has history of Type 2 diabetes mellitus with neuropathy, sarcoidosis, sleep apnea, hypertension, presents for hospital discharged follow up. He is s/p right transmetatarsal amputation and was discharged on 12/20/20. He followed up with Podiatrist Dr Posey Pronto on 12/23/20, recommended he continue non weight bearing to right leg. He states that his sutures are intact, no erythema nor discharge and he performs daily dressing change. HgbA1c done on 12/11/20 was 8% He checks his blood glucose tid, he states that it was 248 mg/dl.He denies hypoglycemic symptoms, admits polydipsia, performs daily foot checks and states that his peripheral neuropathy has improved 30% since starting gabapentin 100 mg. His blood was elevated during visit, he states that he's compliant with his medication and adheres to [place]. Overall, he states that he's doing well and offers no further complaint.  Past Medical History:  Diagnosis Date   Diabetes mellitus without complication ([place])    Neuropathy    Sarcoidosis    Sleep apnea     Past Surgical History:  Procedure Laterality Date   AMPUTATION Right 11/05/2020   Procedure: AMPUTATION RAY-Partial Ray Amp 2nd & 3rd Digit;  Surgeon: Edrick Kins, DPM;  Location: [facility];  Service: Podiatry;  Laterality: Right;   AMPUTATION Right 12/13/2020   Procedure: AMPUTATION midFOOT-RIGHT;  Surgeon: Criselda Peaches, DPM;  Location: [facility];  Service: Podiatry;  Laterality: Right;   APPLICATION OF WOUND VAC Right 11/09/2020   Procedure: APPLICATION OF WOUND VAC;  Surgeon: Edrick Kins, DPM;  Location: [facility];  Service: Podiatry;  Laterality: Right;   INCISION AND DRAINAGE Right 11/01/2020   Procedure: INCISION AND DRAINAGE-Right Foot;  Surgeon: Criselda Peaches, DPM;  Location:  [facility];  Service: Podiatry;  Laterality: Right;   IRRIGATION AND DEBRIDEMENT FOOT Right 11/09/2020   Procedure: IRRIGATION AND DEBRIDEMENT FOOT;  Surgeon: Edrick Kins, DPM;  Location: [facility];  Service: Podiatry;  Laterality: Right;   IRRIGATION AND DEBRIDEMENT FOOT Right 12/13/2020   Procedure: IRRIGATION AND DEBRIDEMENT FOOT;  Surgeon: Criselda Peaches, DPM;  Location: [facility];  Service: Podiatry;  Laterality: Right;   WOUND DEBRIDEMENT Right 12/16/2020   Procedure: DEBRIDEMENT WOUND;  Surgeon: Felipa Furnace, DPM;  Location: [facility];  Service: Podiatry;  Laterality: Right;  Transmet amputation washout & closure    Family History  Problem Relation Age of Onset   Hypertension Mother    Diabetes type II Father    Parkinson's disease Father    Heart disease Father     Social History   Socioeconomic History   Marital status: Married    Spouse name: Not on file   Number of children: Not on file   Years of education: Not on file   Highest education level: Not on file  Occupational History   Not on file  Tobacco Use   Smoking status: Never   Smokeless tobacco: Never  Substance and Sexual Activity   Alcohol use: No   Drug use: No   Sexual activity: Not on file  Other Topics Concern   Not on file  Social History Narrative   Not on file   Social Determinants of Health   Financial Resource Strain: Not on file  Food Insecurity: Not on file  Transportation Needs:  Not on file  Physical Activity: Not on file  Stress: Not on file  Social Connections: Not on file  Intimate Partner Violence: Not on file    Outpatient Medications Prior to Visit  Medication Sig Dispense Refill   acetaminophen (TYLENOL) 500 MG tablet Take 1 tablet (500 mg total) by mouth every 6 (six) hours as needed for mild pain (or Fever >/= 101). 30 tablet 0   albuterol (VENTOLIN HFA) 108 (90 Base) MCG/ACT inhaler Inhale 2 puffs into the lungs once every 6 (six) hours as needed for wheezing or shortness of  breath. 6.7 g 0   ascorbic acid (VITAMIN C) 250 MG tablet Take 1 tablet (250 mg total) by mouth once daily. 30 tablet 0   ciprofloxacin (CIPRO) 500 MG tablet Take 1 tablet (500 mg total) by mouth 2 (two) times daily for 28 days. 56 tablet 0   doxycycline (VIBRA-TABS) 100 MG tablet Take 1 tablet (100 mg total) by mouth 2 (two) times daily. 28 tablet 0   Multiple Vitamin (MULTIVITAMIN WITH MINERALS) TABS tablet Take 1 tablet by mouth once daily. 30 tablet 0   traMADol (ULTRAM) 50 MG tablet Take 1 tablet (50 mg total) by mouth every 6 (six) hours as needed for moderate pain. 30 tablet 0   amLODipine (NORVASC) 10 MG tablet Take 1 tablet (10 mg total) by mouth once daily. 30 tablet 0   gabapentin (NEURONTIN) 100 MG capsule Take 1 capsule (100 mg total) by mouth once daily at bedtime. 30 capsule 0   Insulin Glargine (BASAGLAR KWIKPEN) 100 UNIT/ML Inject 12 Units into the skin 2 (two) times daily. 15 mL 1   insulin lispro (HUMALOG KWIKPEN) 100 UNIT/ML KwikPen Inject 12 Units into the skin 3 (three) times daily before meals and follow your established sliding scale as well. 15 mL 3   Insulin Pen Needle 32G X 6 MM MISC USE AS DIRECTED WITH INSULIN PENS. 100 each 1   pantoprazole (PROTONIX) 40 MG tablet Take 1 tablet (40 mg total) by mouth once daily. 30 tablet 0   No facility-administered medications prior to visit.    No Known Allergies  ROS Review of Systems  Constitutional: Negative.   Eyes: Negative.   Respiratory: Negative.    Endocrine: Negative.   Genitourinary: Negative.   Skin:  Positive for wound (s/p right metatarsal amputation).  Neurological: Negative.      Objective:    Physical Exam HENT:     Head: Normocephalic and atraumatic.     Mouth/Throat:     Mouth: Mucous membranes are moist.  Eyes:     Extraocular Movements: Extraocular movements intact.     Conjunctiva/sclera: Conjunctivae normal.     Pupils: Pupils are equal, round, and reactive to light.  Cardiovascular:      Rate and Rhythm: Normal rate and regular rhythm.     Pulses: Normal pulses.     Heart sounds: Normal heart sounds.  Pulmonary:     Effort: Pulmonary effort is normal.     Breath sounds: Normal breath sounds.  Skin:    Comments: Dressing to right Metatarsal amputation was intact. Non weight bearing to right lower extremity.  Neurological:     General: No focal deficit present.     Mental Status: He is alert and oriented to person, place, and time. Mental status is at baseline.  Psychiatric:        Mood and Affect: Mood normal.        Behavior: Behavior normal.  Thought Content: Thought content normal.        Judgment: Judgment normal.    BP (!) 156/79 (BP Location: Right Arm, Patient Position: Sitting, Cuff Size: Large)   Pulse (!) 102   Resp 16   Wt 193 lb 12.8 oz (87.9 kg)   SpO2 95%   BMI 31.28 kg/m  Wt Readings from Last 3 Encounters:  12/30/20 193 lb 12.8 oz (87.9 kg)  12/11/20 185 lb 3 oz (84 kg)  12/07/20 185 lb 4.8 oz (84.1 kg)     Health Maintenance Due  Topic Date Due   COVID-19 Vaccine (1) Never done   Pneumococcal Vaccine 36-60 Years old (1 - PCV) Never done   FOOT EXAM  Never done   OPHTHALMOLOGY EXAM  Never done   URINE MICROALBUMIN  Never done   Hepatitis C Screening  Never done   Zoster Vaccines- Shingrix (1 of 2) Never done   COLONOSCOPY (Pts 45-45yrs Insurance coverage will need to be confirmed)  Never done   INFLUENZA VACCINE  Never done    There are no preventive care reminders to display for this patient.  No results found for: TSH Lab Results  Component Value Date   WBC 9.0 12/19/2020   HGB 8.7 (L) 12/19/2020   HCT 27.4 (L) 12/19/2020   MCV 88.7 12/19/2020   PLT 341 12/19/2020   Lab Results  Component Value Date   NA 136 12/19/2020   K 4.5 12/19/2020   CO2 31 12/19/2020   GLUCOSE 190 (H) 12/19/2020   BUN 16 12/19/2020   CREATININE 0.68 12/19/2020   BILITOT 0.7 12/18/2020   ALKPHOS 65 12/18/2020   AST 16 12/18/2020   ALT 12  12/18/2020   PROT 7.1 12/18/2020   ALBUMIN 2.8 (L) 12/18/2020   CALCIUM 8.7 (L) 12/19/2020   ANIONGAP 10 12/19/2020   Lab Results  Component Value Date   CHOL 140 12/03/2012   Lab Results  Component Value Date   HDL 31 (L) 12/03/2012   Lab Results  Component Value Date   LDLCALC 70 12/03/2012   Lab Results  Component Value Date   TRIG 197 12/03/2012   No results found for: [facility] Lab Results  Component Value Date   HGBA1C 8.0 (H) 12/11/2020      Assessment & Plan:      1. Type 2 diabetes mellitus with peripheral neuropathy (HCC) -His peripheral neuropathy is not controlled, increased gabapentin to 300 mg at qhs. -His Basaglar was increased to 15 units, 10 units Humalog tid with meals with sliding scale. He was encouraged to continue on low carb/non concentrated sweet diet. - gabapentin (NEURONTIN) 300 MG capsule; Take 1 capsule (300 mg total) by mouth at bedtime.  Dispense: 30 capsule; Refill: 2 - Insulin Glargine (BASAGLAR KWIKPEN) 100 UNIT/ML; Inject 15 Units into the skin at bedtime.  Dispense: 15 mL; Refill: 3 - insulin lispro (HUMALOG KWIKPEN) 100 UNIT/ML KwikPen; Inject 10 Units into the skin 3 (three) times daily before meals. Blood sugar 201-250 Add 1 unit = 11 units Blood sugar 251-300 Add 2 units = 12 units Blood sugar 301- 350 Add 3 units = 13 units Blood sugar 351-400 Add 4 units = 14 units If greater than 400 units add 5 units, recheck in 2 hours if still greater than 400 go to the hospital  Dispense: 15 mL; Refill: 3 - Insulin Pen Needle 32G X 6 MM MISC; USE AS DIRECTED WITH INSULIN PENS.  Dispense: 100 each; Refill: 1  2. Essential  hypertension - His blood pressure is not under control, he will continue on current medication , DASH diet, check, record and bring blood pressure log to follow up appointment. He was educated on signs and symptoms of stroke and advised to go to the ED. - amLODipine (NORVASC) 10 MG tablet; Take 1 tablet (10 mg total) by mouth  once daily.  Dispense: 30 tablet; Refill: 2  3. Gastroesophageal reflux disease without esophagitis - His acid reflux is under control and he will continue on current medication. -Avoid spicy, fatty and fried food -Avoid sodas and sour juices -Avoid heavy meals -Avoid eating 4 hours before bedtime -Elevate head of bed at night - pantoprazole (PROTONIX) 40 MG tablet; Take 1 tablet (40 mg total) by mouth once daily.  Dispense: 30 tablet; Refill: 2   Follow-up: Return in about 4 weeks (around 01/27/2021), or if symptoms worsen or fail to improve.    Nimai Burbach Jerold Coombe, NP

## 2020-12-30 NOTE — Patient Instructions (Signed)
DASH Eating Plan DASH stands for Dietary Approaches to Stop Hypertension. The DASH eating plan is a healthy eating plan that has been shown to: Reduce high blood pressure (hypertension). Reduce your risk for type 2 diabetes, heart disease, and stroke. Help with weight loss. What are tips for following this plan? Reading food labels Check food labels for the amount of salt (sodium) per serving. Choose foods with less than 5 percent of the Daily Value of sodium. Generally, foods with less than 300 milligrams (mg) of sodium per serving fit into this eating plan. To find whole grains, look for the word "whole" as the first word in the ingredient list. Shopping Buy products labeled as "low-sodium" or "no salt added." Buy fresh foods. Avoid canned foods and pre-made or frozen meals. Cooking Avoid adding salt when cooking. Use salt-free seasonings or herbs instead of table salt or sea salt. Check with your health care provider or pharmacist before using salt substitutes. Do not fry foods. Cook foods using healthy methods such as baking, boiling, grilling, roasting, and broiling instead. Cook with heart-healthy oils, such as olive, canola, avocado, soybean, or sunflower oil. Meal planning  Eat a balanced diet that includes: 4 or more servings of fruits and 4 or more servings of vegetables each day. Try to fill one-half of your plate with fruits and vegetables. 6-8 servings of whole grains each day. Less than 6 oz (170 g) of lean meat, poultry, or fish each day. A 3-oz (85-g) serving of meat is about the same size as a deck of cards. One egg equals 1 oz (28 g). 2-3 servings of low-fat dairy each day. One serving is 1 cup (237 mL). 1 serving of nuts, seeds, or beans 5 times each week. 2-3 servings of heart-healthy fats. Healthy fats called omega-3 fatty acids are found in foods such as walnuts, flaxseeds, fortified milks, and eggs. These fats are also found in cold-water fish, such as sardines, salmon,  and mackerel. Limit how much you eat of: Canned or prepackaged foods. Food that is high in trans fat, such as some fried foods. Food that is high in saturated fat, such as fatty meat. Desserts and other sweets, sugary drinks, and other foods with added sugar. Full-fat dairy products. Do not salt foods before eating. Do not eat more than 4 egg yolks a week. Try to eat at least 2 vegetarian meals a week. Eat more home-cooked food and less restaurant, buffet, and fast food. Lifestyle When eating at a restaurant, ask that your food be prepared with less salt or no salt, if possible. If you drink alcohol: Limit how much you use to: 0-1 drink a day for women who are not pregnant. 0-2 drinks a day for men. Be aware of how much alcohol is in your drink. In the [place] one drink equals one 12 oz bottle of beer (355 mL), one 5 oz glass of wine (148 mL), or one 1 oz glass of hard liquor (44 mL). General information Avoid eating more than 2,300 mg of salt a day. If you have hypertension, you may need to reduce your sodium intake to 1,500 mg a day. Work with your health care provider to maintain a healthy body weight or to lose weight. Ask what an ideal weight is for you. Get at least 30 minutes of exercise that causes your heart to beat faster (aerobic exercise) most days of the week. Activities may include walking, swimming, or biking. Work with your health care provider or dietitian to   adjust your eating plan to your individual calorie needs. What foods should I eat? Fruits All fresh, dried, or frozen fruit. Canned fruit in natural juice (without added sugar). Vegetables Fresh or frozen vegetables (raw, steamed, roasted, or grilled). Low-sodium or reduced-sodium tomato and vegetable juice. Low-sodium or reduced-sodium tomato sauce and tomato paste. Low-sodium or reduced-sodium canned vegetables. Grains Whole-grain or whole-wheat bread. Whole-grain or whole-wheat pasta. Brown rice. Oatmeal. Quinoa.  Bulgur. Whole-grain and low-sodium cereals. Pita bread. Low-fat, low-sodium crackers. Whole-wheat flour tortillas. Meats and other proteins Skinless chicken or turkey. Ground chicken or turkey. Pork with fat trimmed off. Fish and seafood. Egg whites. Dried beans, peas, or lentils. Unsalted nuts, nut butters, and seeds. Unsalted canned beans. Lean cuts of beef with fat trimmed off. Low-sodium, lean precooked or cured meat, such as sausages or meat loaves. Dairy Low-fat (1%) or fat-free (skim) milk. Reduced-fat, low-fat, or fat-free cheeses. Nonfat, low-sodium ricotta or cottage cheese. Low-fat or nonfat yogurt. Low-fat, low-sodium cheese. Fats and oils Soft margarine without trans fats. Vegetable oil. Reduced-fat, low-fat, or light mayonnaise and salad dressings (reduced-sodium). Canola, safflower, olive, avocado, soybean, and sunflower oils. Avocado. Seasonings and condiments Herbs. Spices. Seasoning mixes without salt. Other foods Unsalted popcorn and pretzels. Fat-free sweets. The items listed above may not be a complete list of foods and beverages you can eat. Contact a dietitian for more information. What foods should I avoid? Fruits Canned fruit in a light or heavy syrup. Fried fruit. Fruit in cream or butter sauce. Vegetables Creamed or fried vegetables. Vegetables in a cheese sauce. Regular canned vegetables (not low-sodium or reduced-sodium). Regular canned tomato sauce and paste (not low-sodium or reduced-sodium). Regular tomato and vegetable juice (not low-sodium or reduced-sodium). Pickles. Olives. Grains Baked goods made with fat, such as croissants, muffins, or some breads. Dry pasta or rice meal packs. Meats and other proteins Fatty cuts of meat. Ribs. Fried meat. Bacon. Bologna, salami, and other precooked or cured meats, such as sausages or meat loaves. Fat from the back of a pig (fatback). Bratwurst. Salted nuts and seeds. Canned beans with added salt. Canned or smoked fish.  Whole eggs or egg yolks. Chicken or turkey with skin. Dairy Whole or 2% milk, cream, and half-and-half. Whole or full-fat cream cheese. Whole-fat or sweetened yogurt. Full-fat cheese. Nondairy creamers. Whipped toppings. Processed cheese and cheese spreads. Fats and oils Butter. Stick margarine. Lard. Shortening. Ghee. Bacon fat. Tropical oils, such as coconut, palm kernel, or palm oil. Seasonings and condiments Onion salt, garlic salt, seasoned salt, table salt, and sea salt. Worcestershire sauce. Tartar sauce. Barbecue sauce. Teriyaki sauce. Soy sauce, including reduced-sodium. Steak sauce. Canned and packaged gravies. Fish sauce. Oyster sauce. Cocktail sauce. Store-bought horseradish. Ketchup. Mustard. Meat flavorings and tenderizers. Bouillon cubes. Hot sauces. Pre-made or packaged marinades. Pre-made or packaged taco seasonings. Relishes. Regular salad dressings. Other foods Salted popcorn and pretzels. The items listed above may not be a complete list of foods and beverages you should avoid. Contact a dietitian for more information. Where to find more information National Heart, Lung, and Blood Institute: www.nhlbi.nih.gov American Heart Association: www.heart.org Academy of Nutrition and Dietetics: www.eatright.org National Kidney Foundation: www.kidney.org Summary The DASH eating plan is a healthy eating plan that has been shown to reduce high blood pressure (hypertension). It may also reduce your risk for type 2 diabetes, heart disease, and stroke. When on the DASH eating plan, aim to eat more fresh fruits and vegetables, whole grains, lean proteins, low-fat dairy, and heart-healthy fats. With the DASH   eating plan, you should limit salt (sodium) intake to 2,300 mg a day. If you have hypertension, you may need to reduce your sodium intake to 1,500 mg a day. Work with your health care provider or dietitian to adjust your eating plan to your individual calorie needs. This information is not  intended to replace advice given to you by your health care provider. Make sure you discuss any questions you have with your health care provider. Document Revised: 01/31/2019 Document Reviewed: 01/31/2019 Elsevier Patient Education  2022 [place] for Diabetes Mellitus, Adult Carbohydrate counting is a method of keeping track of how many carbohydrates you eat. Eating carbohydrates naturally increases the amount of sugar (glucose) in the blood. Counting how many carbohydrates you eat improves your blood glucose control, which helps you manage your diabetes. It is important to know how many carbohydrates you can safely have in each meal. This is different for every person. A dietitian can help you make a meal plan and calculate how many carbohydrates you should have at each meal and snack. What foods contain carbohydrates? Carbohydrates are found in the following foods: Grains, such as breads and cereals. Dried beans and soy products. Starchy vegetables, such as potatoes, peas, and corn. Fruit and fruit juices. Milk and yogurt. Sweets and snack foods, such as cake, cookies, candy, chips, and soft drinks. How do I count carbohydrates in foods? There are two ways to count carbohydrates in food. You can read food labels or learn standard serving sizes of foods. You can use either of the methods or a combination of both. Using the Nutrition Facts label The Nutrition Facts list is included on the labels of almost all packaged foods and beverages in the [place] It includes: The serving size. Information about nutrients in each serving, including the grams (g) of carbohydrate per serving. To use the Nutrition Facts: Decide how many servings you will have. Multiply the number of servings by the number of carbohydrates per serving. The resulting number is the total amount of carbohydrates that you will be having. Learning the standard serving sizes of foods When you eat  carbohydrate foods that are not packaged or do not include Nutrition Facts on the label, you need to measure the servings in order to count the amount of carbohydrates. Measure the foods that you will eat with a food scale or measuring cup, if needed. Decide how many standard-size servings you will eat. Multiply the number of servings by 15. For foods that contain carbohydrates, one serving equals 15 g of carbohydrates. For example, if you eat 2 cups or 10 oz (300 g) of strawberries, you will have eaten 2 servings and 30 g of carbohydrates (2 servings x 15 g = 30 g). For foods that have more than one food mixed, such as soups and casseroles, you must count the carbohydrates in each food that is included. The following list contains standard serving sizes of common carbohydrate-rich foods. Each of these servings has about 15 g of carbohydrates: 1 slice of bread. 1 six-inch (15 cm) tortilla. ? cup or 2 oz (53 g) cooked rice or pasta.  cup or 3 oz (85 g) cooked or canned, drained and rinsed beans or lentils.  cup or 3 oz (85 g) starchy vegetable, such as peas, corn, or squash.  cup or 4 oz (120 g) hot cereal.  cup or 3 oz (85 g) boiled or mashed potatoes, or  or 3 oz (85 g) of a large baked potato.  cup or 4 fl oz (118 mL) fruit juice. 1 cup or 8 fl oz (237 mL) milk. 1 small or 4 oz (106 g) apple.  or 2 oz (63 g) of a medium banana. 1 cup or 5 oz (150 g) strawberries. 3 cups or 1 oz (24 g) popped popcorn. What is an example of carbohydrate counting? To calculate the number of carbohydrates in this sample meal, follow the steps shown below. Sample meal 3 oz (85 g) chicken breast. ? cup or 4 oz (106 g) brown rice.  cup or 3 oz (85 g) corn. 1 cup or 8 fl oz (237 mL) milk. 1 cup or 5 oz (150 g) strawberries with sugar-free whipped topping. Carbohydrate calculation Identify the foods that contain carbohydrates: Rice. Corn. Milk. Strawberries. Calculate how many servings you have of  each food: 2 servings rice. 1 serving corn. 1 serving milk. 1 serving strawberries. Multiply each number of servings by 15 g: 2 servings rice x 15 g = 30 g. 1 serving corn x 15 g = 15 g. 1 serving milk x 15 g = 15 g. 1 serving strawberries x 15 g = 15 g. Add together all of the amounts to find the total grams of carbohydrates eaten: 30 g + 15 g + 15 g + 15 g = 75 g of carbohydrates total. What are tips for following this plan? Shopping Develop a meal plan and then make a shopping list. Buy fresh and frozen vegetables, fresh and frozen fruit, dairy, eggs, beans, lentils, and whole grains. Look at food labels. Choose foods that have more fiber and less sugar. Avoid processed foods and foods with added sugars. Meal planning Aim to have the same amount of carbohydrates at each meal and for each snack time. Plan to have regular, balanced meals and snacks. Where to find more information American Diabetes Association: www.diabetes.org Centers for Disease Control and Prevention: http://www.wolf.info/ Summary Carbohydrate counting is a method of keeping track of how many carbohydrates you eat. Eating carbohydrates naturally increases the amount of sugar (glucose) in the blood. Counting how many carbohydrates you eat improves your blood glucose control, which helps you manage your diabetes. A dietitian can help you make a meal plan and calculate how many carbohydrates you should have at each meal and snack. This information is not intended to replace advice given to you by your health care provider. Make sure you discuss any questions you have with your health care provider. Document Revised: 02/27/2019 Document Reviewed: 02/28/2019 Elsevier Patient Education  2021 [employer].

## 2020-12-31 ENCOUNTER — Other Ambulatory Visit: Payer: Self-pay

## 2020-12-31 ENCOUNTER — Encounter: Payer: Self-pay | Admitting: Podiatry

## 2021-01-03 ENCOUNTER — Other Ambulatory Visit: Payer: Self-pay

## 2021-01-03 ENCOUNTER — Ambulatory Visit: Payer: Self-pay | Admitting: Pharmacy Technician

## 2021-01-03 ENCOUNTER — Telehealth: Payer: Self-pay | Admitting: Pharmacy Technician

## 2021-01-03 DIAGNOSIS — Z79899 Other long term (current) drug therapy: Secondary | ICD-10-CM

## 2021-01-03 NOTE — Telephone Encounter (Signed)
Completed Patient Intake Application with patient.

## 2021-01-05 ENCOUNTER — Encounter: Payer: Medicaid Other | Admitting: Internal Medicine

## 2021-01-06 ENCOUNTER — Encounter: Payer: Self-pay | Admitting: Emergency Medicine

## 2021-01-06 ENCOUNTER — Ambulatory Visit (INDEPENDENT_AMBULATORY_CARE_PROVIDER_SITE_OTHER): Payer: Self-pay | Admitting: Podiatry

## 2021-01-06 ENCOUNTER — Ambulatory Visit: Payer: Self-pay | Admitting: Infectious Diseases

## 2021-01-06 ENCOUNTER — Inpatient Hospital Stay
Admission: EM | Admit: 2021-01-06 | Discharge: 2021-01-15 | DRG: 475 | Disposition: A | Discharge status: Home / Self Care | Payer: Medicaid Other | Attending: Internal Medicine | Admitting: Internal Medicine

## 2021-01-06 ENCOUNTER — Emergency Department: Payer: Medicaid Other

## 2021-01-06 ENCOUNTER — Other Ambulatory Visit: Payer: Self-pay

## 2021-01-06 DIAGNOSIS — G4733 Obstructive sleep apnea (adult) (pediatric): Secondary | ICD-10-CM | POA: Diagnosis present

## 2021-01-06 DIAGNOSIS — E11628 Type 2 diabetes mellitus with other skin complications: Secondary | ICD-10-CM | POA: Diagnosis present

## 2021-01-06 DIAGNOSIS — Z9889 Other specified postprocedural states: Secondary | ICD-10-CM

## 2021-01-06 DIAGNOSIS — Z794 Long term (current) use of insulin: Secondary | ICD-10-CM

## 2021-01-06 DIAGNOSIS — Z833 Family history of diabetes mellitus: Secondary | ICD-10-CM

## 2021-01-06 DIAGNOSIS — G4730 Sleep apnea, unspecified: Secondary | ICD-10-CM | POA: Diagnosis present

## 2021-01-06 DIAGNOSIS — Z79899 Other long term (current) drug therapy: Secondary | ICD-10-CM

## 2021-01-06 DIAGNOSIS — Z751 Person awaiting admission to adequate facility elsewhere: Secondary | ICD-10-CM

## 2021-01-06 DIAGNOSIS — Y835 Amputation of limb(s) as the cause of abnormal reaction of the patient, or of later complication, without mention of misadventure at the time of the procedure: Secondary | ICD-10-CM | POA: Diagnosis present

## 2021-01-06 DIAGNOSIS — L03115 Cellulitis of right lower limb: Secondary | ICD-10-CM | POA: Diagnosis present

## 2021-01-06 DIAGNOSIS — L089 Local infection of the skin and subcutaneous tissue, unspecified: Secondary | ICD-10-CM

## 2021-01-06 DIAGNOSIS — T8130XA Disruption of wound, unspecified, initial encounter: Secondary | ICD-10-CM

## 2021-01-06 DIAGNOSIS — D869 Sarcoidosis, unspecified: Secondary | ICD-10-CM | POA: Diagnosis present

## 2021-01-06 DIAGNOSIS — T8781 Dehiscence of amputation stump: Secondary | ICD-10-CM | POA: Diagnosis present

## 2021-01-06 DIAGNOSIS — E1142 Type 2 diabetes mellitus with diabetic polyneuropathy: Secondary | ICD-10-CM | POA: Diagnosis present

## 2021-01-06 DIAGNOSIS — E1152 Type 2 diabetes mellitus with diabetic peripheral angiopathy with gangrene: Secondary | ICD-10-CM | POA: Diagnosis present

## 2021-01-06 DIAGNOSIS — Z89431 Acquired absence of right foot: Secondary | ICD-10-CM

## 2021-01-06 DIAGNOSIS — E1169 Type 2 diabetes mellitus with other specified complication: Secondary | ICD-10-CM | POA: Diagnosis present

## 2021-01-06 DIAGNOSIS — M869 Osteomyelitis, unspecified: Secondary | ICD-10-CM

## 2021-01-06 DIAGNOSIS — D6489 Other specified anemias: Secondary | ICD-10-CM | POA: Diagnosis present

## 2021-01-06 DIAGNOSIS — Z8616 Personal history of COVID-19: Secondary | ICD-10-CM

## 2021-01-06 DIAGNOSIS — E1165 Type 2 diabetes mellitus with hyperglycemia: Secondary | ICD-10-CM | POA: Diagnosis not present

## 2021-01-06 DIAGNOSIS — Z8249 Family history of ischemic heart disease and other diseases of the circulatory system: Secondary | ICD-10-CM

## 2021-01-06 DIAGNOSIS — I10 Essential (primary) hypertension: Secondary | ICD-10-CM | POA: Diagnosis present

## 2021-01-06 DIAGNOSIS — I96 Gangrene, not elsewhere classified: Secondary | ICD-10-CM | POA: Diagnosis present

## 2021-01-06 DIAGNOSIS — T8743 Infection of amputation stump, right lower extremity: Principal | ICD-10-CM

## 2021-01-06 DIAGNOSIS — M86171 Other acute osteomyelitis, right ankle and foot: Secondary | ICD-10-CM

## 2021-01-06 LAB — CBC WITH DIFFERENTIAL/PLATELET
Abs Immature Granulocytes: 0.02 10*3/uL (ref 0.00–0.07)
Basophils Absolute: 0 10*3/uL (ref 0.0–0.1)
Basophils Relative: 0 %
Eosinophils Absolute: 0.3 10*3/uL (ref 0.0–0.5)
Eosinophils Relative: 4 %
HCT: 34.4 % — ABNORMAL LOW (ref 39.0–52.0)
Hemoglobin: 11 g/dL — ABNORMAL LOW (ref 13.0–17.0)
Immature Granulocytes: 0 %
Lymphocytes Relative: 33 %
Lymphs Abs: 2.7 10*3/uL (ref 0.7–4.0)
MCH: 27 pg (ref 26.0–34.0)
MCHC: 32 g/dL (ref 30.0–36.0)
MCV: 84.3 fL (ref 80.0–100.0)
Monocytes Absolute: 0.8 10*3/uL (ref 0.1–1.0)
Monocytes Relative: 10 %
Neutro Abs: 4.2 10*3/uL (ref 1.7–7.7)
Neutrophils Relative %: 53 %
Platelets: 444 10*3/uL — ABNORMAL HIGH (ref 150–400)
RBC: 4.08 MIL/uL — ABNORMAL LOW (ref 4.22–5.81)
RDW: 14.1 % (ref 11.5–15.5)
WBC: 8 10*3/uL (ref 4.0–10.5)
nRBC: 0 % (ref 0.0–0.2)

## 2021-01-06 LAB — BASIC METABOLIC PANEL
Anion gap: 10 (ref 5–15)
BUN: 19 mg/dL (ref 6–20)
CO2: 27 mmol/L (ref 22–32)
Calcium: 8.9 mg/dL (ref 8.9–10.3)
Chloride: 100 mmol/L (ref 98–111)
Creatinine, Ser: 0.81 mg/dL (ref 0.61–1.24)
GFR, Estimated: >60 mL/min (ref >60)
Glucose, Bld: 281 mg/dL — ABNORMAL HIGH (ref 70–99)
Potassium: 3.7 mmol/L (ref 3.5–5.1)
Sodium: 137 mmol/L (ref 135–145)

## 2021-01-06 LAB — TYPE AND SCREEN
ABO/RH(D): A POS
Antibody Screen: NEGATIVE

## 2021-01-06 LAB — RESP PANEL BY RT-PCR (FLU A&B, COVID) ARPGX2
Influenza A by PCR: NEGATIVE
Influenza B by PCR: NEGATIVE
SARS Coronavirus 2 by RT PCR: NEGATIVE

## 2021-01-06 LAB — LACTIC ACID, PLASMA: Lactic Acid, Venous: 1.2 mmol/L (ref 0.5–1.9)

## 2021-01-06 MED ORDER — GADOBUTROL 1 MMOL/ML IV SOLN
7.5000 mL | Freq: Once | INTRAVENOUS | Status: AC | PRN
  Administered 2021-01-06: 7.5 mL via INTRAVENOUS

## 2021-01-06 MED ORDER — CIPROFLOXACIN IN D5W 400 MG/200ML IV SOLN
400.0000 mg | Freq: Once | INTRAVENOUS | Status: AC
  Administered 2021-01-07: 400 mg via INTRAVENOUS
  Filled 2021-01-06 (×2): qty 200

## 2021-01-06 MED ORDER — SODIUM CHLORIDE 0.9 % IV SOLN: 2.0000 g | Freq: Once | INTRAVENOUS | Status: DC

## 2021-01-06 MED ORDER — VANCOMYCIN HCL IN DEXTROSE 1-5 GM/200ML-% IV SOLN: 1000.0000 mg | Freq: Once | INTRAVENOUS | Status: DC

## 2021-01-06 NOTE — ED Triage Notes (Signed)
Pt reports was sent to the ED for admission from his wound MD for infection of a surgical site

## 2021-01-06 NOTE — ED Provider Notes (Signed)
Emergency Medicine Provider Triage Evaluation Note  Fernando Vega, a 50 y.o. male  was evaluated in triage.  Pt complains of right foot cellulitis/osteomyelitis.  Patient presents to the ED from [facility], for evaluation of concern for exposed bone to the recently amputated foot.  Chart review reveals that the podiatrist is concerned for osteomyelitis versus cellulitis of the foot, and is recommending admission for IV antibiotics and potential surgical debridement versus extension of the amputation.  Patient denies any pain given his history of neuropathy, but denies any fevers, chills, sweats.  Review of Systems  Positive: Right foot stump cellulitis/osteomyelitis  Negative: FCS  Physical Exam  Ht 5\' 7"  (1.702 m)   Wt 85.7 kg   BMI 29.60 kg/m  Gen:   Awake, no distress  NAD Resp:  Normal effort CTA MSK:   Moves extremities without difficulty  Other:  CVS: RRR  Medical Decision Making  Medically screening exam initiated at 5:25 PM.  Appropriate orders placed.  Fernando Vega was informed that the remainder of the evaluation will be completed by another provider, this initial triage assessment does not replace that evaluation, and the importance of remaining in the ED until their evaluation is complete.  Patient with ED evaluation of right foot cellulitis/osteomyelitis, sent to the ED at the request of his podiatrist for IV antibiotic and admission.   Fernando Needles, PA-C 01/06/21 1728    Fernando Pear, MD 01/06/21 Fernando Vega

## 2021-01-06 NOTE — Progress Notes (Signed)
Completed [facility] application and contract.  Patient agreed to all terms of the [facility] contract.    Patient approved to receive medication assistance at [facility] until time for re-certification in 2103, and as long as eligibility criteria continues to be met.    Provided patient with Civil engineer, contracting based on his particular needs.    Referred patient to [facility].  Patient has appointment with [employer] on 11/14 at 2:00 p.m.  [place] [facility]

## 2021-01-06 NOTE — ED Provider Notes (Signed)
[facility] Emergency Department Provider Note   ____________________________________________   Event Date/Time   First MD Initiated Contact with Patient 01/06/21 2348     (approximate)  I have reviewed the triage vital signs and the nursing notes.   HISTORY  Chief Complaint Foot Pain and Wound Infection    HPI Fernando Vega is a 50 y.o. male sent to the ED from [facility] for postsurgical wound infection.  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  Has been taking Cipro and doxycycline since his surgery.  His podiatrist today who was concerned for cellulitis versus osteomyelitis and referred him to the ED for admission for IV antibiotics and possible surgical debridement.  Patient denies fever, chills, chest pain, shortness of breath, abdominal pain, nausea, vomiting or dizziness.     Past Medical History:  Diagnosis Date  . Diabetes mellitus without complication ([place])   . Neuropathy   . Sarcoidosis   . Sleep apnea     Patient Active Problem List   Diagnosis Date Noted  . S/P transmetatarsal amputation of foot, right 12/12/2020(HCC) 01/07/2021  . Infection of right TMA stump ([place]) 01/07/2021  . Acute osteomyelitis of right foot ([place]) 01/07/2021  . Essential hypertension 12/30/2020  . Sleep apnea 12/11/2020  . Anemia 12/11/2020  . Osteomyelitis ([place]) 12/11/2020  . Encounter to establish care 12/02/2020  . Leukocytosis   . Puncture wound 11/01/2020  . Type 2 diabetes mellitus with peripheral neuropathy ([place]) 11/01/2020  . Necrotizing soft tissue infection 11/01/2020  . Diabetic foot infection [facility])     Past Surgical History:  Procedure Laterality Date  . AMPUTATION Right 12/13/2020   Procedure: AMPUTATION midFOOT-RIGHT;  Surgeon: Criselda Peaches, DPM;  Location: [facility] ORS;  Service: Podiatry;  Laterality: Right;  . AMPUTATION Right 11/05/2020   Procedure: AMPUTATION RAY-Partial Ray Amp 2nd & 3rd  Digit;  Surgeon: Edrick Kins, DPM;  Location: [facility] ORS;  Service: Podiatry;  Laterality: Right;  . APPLICATION OF WOUND VAC Right 11/09/2020   Procedure: APPLICATION OF WOUND VAC;  Surgeon: Edrick Kins, DPM;  Location: [facility] ORS;  Service: Podiatry;  Laterality: Right;  . INCISION AND DRAINAGE Right 11/01/2020   Procedure: INCISION AND DRAINAGE-Right Foot;  Surgeon: Criselda Peaches, DPM;  Location: [facility] ORS;  Service: Podiatry;  Laterality: Right;  . IRRIGATION AND DEBRIDEMENT FOOT Right 11/09/2020   Procedure: IRRIGATION AND DEBRIDEMENT FOOT;  Surgeon: Edrick Kins, DPM;  Location: [facility] ORS;  Service: Podiatry;  Laterality: Right;  . IRRIGATION AND DEBRIDEMENT FOOT Right 12/13/2020   Procedure: IRRIGATION AND DEBRIDEMENT FOOT;  Surgeon: Criselda Peaches, DPM;  Location: [facility] ORS;  Service: Podiatry;  Laterality: Right;  . WOUND DEBRIDEMENT Right 12/16/2020   Procedure: DEBRIDEMENT WOUND;  Surgeon: Felipa Furnace, DPM;  Location: [facility] ORS;  Service: Podiatry;  Laterality: Right;  Transmet amputation washout & closure    Prior to Admission medications   Medication Sig Start Date End Date Taking? Authorizing Provider  acetaminophen (TYLENOL) 500 MG tablet Take 1 tablet (500 mg total) by mouth every 6 (six) hours as needed for mild pain (or Fever >/= 101). 12/19/20   Cherene Altes, MD  albuterol (VENTOLIN HFA) 108 (90 Base) MCG/ACT inhaler Inhale 2 puffs into the lungs once every 6 (six) hours as needed for wheezing or shortness of breath. 11/12/20   Loletha Grayer, MD  amLODipine (NORVASC) 10 MG tablet Take 1 tablet (10 mg total) by mouth once daily.  12/30/20   Iloabachie, Chioma E, NP  ascorbic acid (VITAMIN C) 250 MG tablet Take 1 tablet (250 mg total) by mouth once daily. 11/13/20   Loletha Grayer, MD  ciprofloxacin (CIPRO) 500 MG tablet Take 1 tablet (500 mg total) by mouth 2 (two) times daily for 28 days. 12/19/20 01/17/21  Cherene Altes, MD  doxycycline (VIBRA-TABS) 100 MG tablet  Take 1 tablet (100 mg total) by mouth 2 (two) times daily. 12/24/20   Felipa Furnace, DPM  gabapentin (NEURONTIN) 300 MG capsule Take 1 capsule (300 mg total) by mouth once daily at bedtime. 12/30/20   Iloabachie, Chioma E, NP  Insulin Glargine (BASAGLAR KWIKPEN) 100 UNIT/ML Inject 15 Units into the skin at bedtime. 12/30/20   Iloabachie, Chioma E, NP  insulin lispro (HUMALOG KWIKPEN) 100 UNIT/ML KwikPen Inject 10 Units into the skin 3 (three) times daily before meals. BG 201-250 Add 1 unit =11 units; BG 251-300 Add 2 units =12 units BG 301- 350 Add 3 units =13 units BG 351-400 Add 4 units =14 units If over 400 units add 5 units, recheck in 2 hours if still over 400 go to the hospital. 12/30/20   Iloabachie, Chioma E, NP  Insulin Pen Needle 32G X 6 MM MISC USE AS DIRECTED WITH INSULIN PENS. 12/30/20   Iloabachie, Chioma E, NP  Multiple Vitamin (MULTIVITAMIN WITH MINERALS) TABS tablet Take 1 tablet by mouth once daily. 11/13/20   Loletha Grayer, MD  pantoprazole (PROTONIX) 40 MG tablet Take 1 tablet (40 mg total) by mouth once daily. 12/30/20   Iloabachie, Chioma E, NP  traMADol (ULTRAM) 50 MG tablet Take 1 tablet (50 mg total) by mouth every 6 (six) hours as needed for moderate pain. 12/19/20   Cherene Altes, MD    Allergies Patient has no known allergies.  Family History  Problem Relation Age of Onset  . Hypertension Mother   . Diabetes type II Father   . Parkinson's disease Father   . Heart disease Father     Social History Social History   Tobacco Use  . Smoking status: Never  . Smokeless tobacco: Never  Substance Use Topics  . Alcohol use: No  . Drug use: No    Review of Systems  Constitutional: No fever/chills Eyes: No visual changes. ENT: No sore throat. Cardiovascular: Denies chest pain. Respiratory: Denies shortness of breath. Gastrointestinal: No abdominal pain.  No nausea, no vomiting.  No diarrhea.  No constipation. Genitourinary: Negative for  dysuria. Musculoskeletal: Positive for right foot pain and redness.  Negative for back pain. Skin: Negative for rash. Neurological: Negative for headaches, focal weakness or numbness.   ____________________________________________   PHYSICAL EXAM:  VITAL SIGNS: ED Triage Vitals [01/06/21 1704]  Enc Vitals Group     BP      Pulse      Resp      Temp      Temp src      SpO2      Weight 189 lb (85.7 kg)     Height 5\' 7"  (1.702 m)     Head Circumference      Peak Flow      Pain Score 5     Pain Loc      Pain Edu?      Excl. in [place]?     Constitutional: Alert and oriented. Well appearing and in no acute distress. Eyes: Conjunctivae are normal. PERRL. EOMI. Head: Atraumatic. Nose: No congestion/rhinnorhea. Mouth/Throat: Mucous membranes are moist.  Neck: No stridor.   Cardiovascular: Normal rate, regular rhythm. Grossly normal heart sounds.  Good peripheral circulation. Respiratory: Normal respiratory effort.  No retractions. Lungs CTAB. Gastrointestinal: Soft and nontender. No distention. No abdominal bruits. No CVA tenderness. Musculoskeletal: Erythema and fluctuance surrounding postsurgical site.  No joint effusions.  Palpable distal pulses.   Neurologic:  Normal speech and language. No gross focal neurologic deficits are appreciated. No gait instability. Skin:  Skin is warm, dry and intact. No rash noted. Psychiatric: Mood and affect are normal. Speech and behavior are normal.  ____________________________________________   LABS (all labs ordered are listed, but only abnormal results are displayed)  Labs Reviewed  BASIC METABOLIC PANEL - Abnormal; Notable for the following components:      Result Value   Glucose, Bld 281 (*)    All other components within normal limits  CBC WITH DIFFERENTIAL/PLATELET - Abnormal; Notable for the following components:   RBC 4.08 (*)    Hemoglobin 11.0 (*)    HCT 34.4 (*)    Platelets 444 (*)    All other components within  normal limits  RESP PANEL BY RT-PCR (FLU A&B, COVID) ARPGX2  CULTURE, BLOOD (ROUTINE X 2)  CULTURE, BLOOD (ROUTINE X 2)  LACTIC ACID, PLASMA  URINALYSIS, ROUTINE W REFLEX MICROSCOPIC  LACTIC ACID, PLASMA  TYPE AND SCREEN   ____________________________________________  EKG  None ____________________________________________  RADIOLOGY I, Kmari Brian J, personally viewed and evaluated these images (plain radiographs) as part of my medical decision making, as well as reviewing the written report by the radiologist.  ED MD interpretation: Acute osteomyelitis  Official radiology report(s): MR FOOT RIGHT W WO CONTRAST  Result Date: 01/06/2021 CLINICAL DATA:  History of transmetatarsal amputation of the right foot. Now with exposed bone and concern for infection EXAM: MRI OF THE RIGHT FOREFOOT WITHOUT AND WITH CONTRAST TECHNIQUE: Multiplanar, multisequence MR imaging of the right foot was performed before and after the administration of intravenous contrast. CONTRAST:  7.23mL GADAVIST GADOBUTROL 1 MMOL/ML IV SOLN COMPARISON:  X-ray 12/14/2020 FINDINGS: Bones/Joint/Cartilage Postsurgical changes from prior right foot amputation including transmetatarsal amputation of the first, fourth, and fifth metatarsals and complete second and third ray resections to the level of the TMT joints. Bone marrow edema is present throughout the residual first, fourth, and fifth metatarsals with enhancement. Confluent low T1 signal changes involving the base of the first metatarsal. Bone marrow edema and enhancement involving the medial, intermediate, and lateral cuneiform bones with probable erosions involving the intermediate and lateral cuneiform as well as its patchy areas of decreased T1 bone marrow signal. Bone marrow edema is also present within the cuboid and navicular bones of the midfoot with areas of low T1 marrow signal changes including along the lateral margin of the cuboid and medial margin of the navicular.  Marrow signal within the visualized calcaneus, talus, distal tibia, and distal fibula are preserved. No acute fracture or dislocation. Ligaments No evidence of acute ligamentous disruption within the midfoot. Muscles and Tendons Post amputation changes of the flexor and extensor tendons of the foot. No tenosynovitis. Diffuse edema-like signal within the foot musculature, which may represent a combination of denervation changes and myositis. Soft tissues Soft tissue wound or ulceration along the dorsal margin of the stump. Irregular areas of soft tissue hypoenhancement along the distal stump concerning for tissue necrosis. No organized or drainable fluid collection. IMPRESSION: 1. Postsurgical changes from prior right foot amputation. 2. Bone marrow signal changes involving portions of the residual first, fourth, and fifth  metatarsals as well as each of the cuneiform bones and also the cuboid and navicular are suggestive of acute osteomyelitis. 3. Soft tissue wound or ulceration along the dorsal margin of the stump. Irregular areas of soft tissue hypoenhancement along the distal stump concerning for tissue necrosis. No organized or drainable fluid collection. Electronically Signed   By: Davina Poke D.O.   On: 01/06/2021 19:01    ____________________________________________   PROCEDURES  Procedure(s) performed (including Critical Care):  Procedures   ____________________________________________   INITIAL IMPRESSION / ASSESSMENT AND PLAN / ED COURSE  As part of my medical decision making, I reviewed the following data within the [place] notes reviewed and incorporated, Labs reviewed, Old chart reviewed, Radiograph reviewed, Discussed with admitting physician, and Notes from prior ED visits     50 year old male, history of diabetes recently status post transmetatarsal amputation of his right foot sent to the ED from his podiatrist for concerns of operative infection.   Differential diagnosis includes but is not limited to cellulitis, osteomyelitis, postsurgical seroma, hematoma, etc.  MRI confirmatory for osteomyelitis.  Will initiate broad-spectrum IV antibiotics and discuss with hospitalist services for admission.      ____________________________________________   FINAL CLINICAL IMPRESSION(S) / ED DIAGNOSES  Final diagnoses:  Osteomyelitis of right foot, unspecified type [facility])     ED Discharge Orders     None        Note:  This document was prepared using Dragon voice recognition software and may include unintentional dictation errors.    Paulette Blanch, MD 01/07/21 [phone]

## 2021-01-06 NOTE — Progress Notes (Addendum)
Subjective:  Patient ID: Fernando Vega, male    DOB: January 07, 1971,  MRN: [id number]  Chief Complaint  Patient presents with   Routine Post Op    DOS 10.6.22    DOS: 12/16/2020 Procedure:  transmetatarsal amputation right foot  50 y.o. male returns for post-op check.  Patient presents with redness circumferentially around the transmetatarsal site with dehiscence.  He states has been doing a lot on his foot he has not been able to properly stay nonweightbearing like he was supposed to.  He did not have help from his wife which led to him doing more on his foot.  He denies any other acute complaints no systemic signs of nausea fever chills vomiting.  Review of Systems: Negative except as noted in the HPI. Denies N/V/F/Ch.  Past Medical History:  Diagnosis Date   Diabetes mellitus without complication ([place])    Neuropathy    Sarcoidosis    Sleep apnea     Current Outpatient Medications:    acetaminophen (TYLENOL) 500 MG tablet, Take 1 tablet (500 mg total) by mouth every 6 (six) hours as needed for mild pain (or Fever >/= 101)., Disp: 30 tablet, Rfl: 0   albuterol (VENTOLIN HFA) 108 (90 Base) MCG/ACT inhaler, Inhale 2 puffs into the lungs once every 6 (six) hours as needed for wheezing or shortness of breath., Disp: 6.7 g, Rfl: 0   amLODipine (NORVASC) 10 MG tablet, Take 1 tablet (10 mg total) by mouth once daily., Disp: 30 tablet, Rfl: 2   ascorbic acid (VITAMIN C) 250 MG tablet, Take 1 tablet (250 mg total) by mouth once daily., Disp: 30 tablet, Rfl: 0   ciprofloxacin (CIPRO) 500 MG tablet, Take 1 tablet (500 mg total) by mouth 2 (two) times daily for 28 days., Disp: 56 tablet, Rfl: 0   doxycycline (VIBRA-TABS) 100 MG tablet, Take 1 tablet (100 mg total) by mouth 2 (two) times daily., Disp: 28 tablet, Rfl: 0   gabapentin (NEURONTIN) 300 MG capsule, Take 1 capsule (300 mg total) by mouth once daily at bedtime., Disp: 30 capsule, Rfl: 2   Insulin Glargine (BASAGLAR KWIKPEN) 100 UNIT/ML,  Inject 15 Units into the skin at bedtime., Disp: 15 mL, Rfl: 3   insulin lispro (HUMALOG KWIKPEN) 100 UNIT/ML KwikPen, Inject 10 Units into the skin 3 (three) times daily before meals. BG 201-250 Add 1 unit =11 units; BG 251-300 Add 2 units =12 units BG 301- 350 Add 3 units =13 units BG 351-400 Add 4 units =14 units If over 400 units add 5 units, recheck in 2 hours if still over 400 go to the hospital., Disp: 15 mL, Rfl: 3   Insulin Pen Needle 32G X 6 MM MISC, USE AS DIRECTED WITH INSULIN PENS., Disp: 100 each, Rfl: 1   Multiple Vitamin (MULTIVITAMIN WITH MINERALS) TABS tablet, Take 1 tablet by mouth once daily., Disp: 30 tablet, Rfl: 0   pantoprazole (PROTONIX) 40 MG tablet, Take 1 tablet (40 mg total) by mouth once daily., Disp: 30 tablet, Rfl: 2   traMADol (ULTRAM) 50 MG tablet, Take 1 tablet (50 mg total) by mouth every 6 (six) hours as needed for moderate pain., Disp: 30 tablet, Rfl: 0  Social History   Tobacco Use  Smoking Status Never  Smokeless Tobacco Never    No Known Allergies Objective:  There were no vitals filed for this visit. There is no height or weight on file to calculate BMI. Constitutional Well developed. Well nourished.  Vascular Very faintly palpable DP/PT  Neurologic Normal speech. Oriented to person, place, and time. Epicritic sensation to light touch grossly present bilaterally.  Dermatologic Dehiscence noted of the transmetatarsal amputation with exposure of metatarsal bones.  Necrotic tissue noted at the dorsal flap likely leading to further failure.  Mild purulent drainage expressed.  Mild malodor present  Orthopedic: Mild tenderness to palpation noted about the surgical site.   Radiographs: None Assessment:   1. Wound dehiscence   2. Cellulitis of right lower extremity   3. Listed for admission to hospital     Plan:  Patient was evaluated and treated and all questions answered.  S/p foot surgery right with transmetatarsal amputation dehiscence with  bone exposure cellulitis -I explained the patient that given that he has been on his foot a lot and seems to help a lot more pressure then he was supposed to likely lead to dehiscence of the transmetatarsal amputation and exposure of bone.  There was some purulent drainage acute clinically expressed.  In the setting of necrotic tissue and malodor I believe patient will benefit from a hospital admission for IV antibiotics and MRI to assess for osteomyelitis. -He may need to return to the operating room for further amputation versus debridement and further closure. -Betadine wet-to-dry dressing  -Continue nonweightbearing to the right lower extremity -His last ABI was within normal limits but given the necrosis presence and in setting dehiscence. He may benefit from vascular evaluation -I will discussed this case with the on-call doctor Dr. Jacqualyn Posey.   No follow-ups on file.

## 2021-01-07 ENCOUNTER — Encounter: Payer: Self-pay | Admitting: Internal Medicine

## 2021-01-07 DIAGNOSIS — I10 Essential (primary) hypertension: Secondary | ICD-10-CM | POA: Diagnosis present

## 2021-01-07 DIAGNOSIS — I96 Gangrene, not elsewhere classified: Secondary | ICD-10-CM | POA: Diagnosis present

## 2021-01-07 DIAGNOSIS — Z833 Family history of diabetes mellitus: Secondary | ICD-10-CM | POA: Diagnosis not present

## 2021-01-07 DIAGNOSIS — Z89431 Acquired absence of right foot: Secondary | ICD-10-CM | POA: Diagnosis not present

## 2021-01-07 DIAGNOSIS — Z8616 Personal history of COVID-19: Secondary | ICD-10-CM | POA: Diagnosis not present

## 2021-01-07 DIAGNOSIS — Y835 Amputation of limb(s) as the cause of abnormal reaction of the patient, or of later complication, without mention of misadventure at the time of the procedure: Secondary | ICD-10-CM | POA: Diagnosis present

## 2021-01-07 DIAGNOSIS — M869 Osteomyelitis, unspecified: Secondary | ICD-10-CM | POA: Diagnosis not present

## 2021-01-07 DIAGNOSIS — T8781 Dehiscence of amputation stump: Secondary | ICD-10-CM | POA: Diagnosis present

## 2021-01-07 DIAGNOSIS — D649 Anemia, unspecified: Secondary | ICD-10-CM | POA: Diagnosis not present

## 2021-01-07 DIAGNOSIS — D869 Sarcoidosis, unspecified: Secondary | ICD-10-CM | POA: Diagnosis present

## 2021-01-07 DIAGNOSIS — E1152 Type 2 diabetes mellitus with diabetic peripheral angiopathy with gangrene: Secondary | ICD-10-CM | POA: Diagnosis present

## 2021-01-07 DIAGNOSIS — M86171 Other acute osteomyelitis, right ankle and foot: Secondary | ICD-10-CM | POA: Diagnosis present

## 2021-01-07 DIAGNOSIS — L089 Local infection of the skin and subcutaneous tissue, unspecified: Secondary | ICD-10-CM | POA: Diagnosis not present

## 2021-01-07 DIAGNOSIS — G4733 Obstructive sleep apnea (adult) (pediatric): Secondary | ICD-10-CM | POA: Diagnosis present

## 2021-01-07 DIAGNOSIS — Z8249 Family history of ischemic heart disease and other diseases of the circulatory system: Secondary | ICD-10-CM | POA: Diagnosis not present

## 2021-01-07 DIAGNOSIS — T8743 Infection of amputation stump, right lower extremity: Principal | ICD-10-CM

## 2021-01-07 DIAGNOSIS — L03115 Cellulitis of right lower limb: Secondary | ICD-10-CM | POA: Diagnosis present

## 2021-01-07 DIAGNOSIS — E1142 Type 2 diabetes mellitus with diabetic polyneuropathy: Secondary | ICD-10-CM

## 2021-01-07 DIAGNOSIS — E1169 Type 2 diabetes mellitus with other specified complication: Secondary | ICD-10-CM | POA: Diagnosis present

## 2021-01-07 DIAGNOSIS — Z794 Long term (current) use of insulin: Secondary | ICD-10-CM | POA: Diagnosis not present

## 2021-01-07 DIAGNOSIS — D6489 Other specified anemias: Secondary | ICD-10-CM | POA: Diagnosis present

## 2021-01-07 DIAGNOSIS — G4730 Sleep apnea, unspecified: Secondary | ICD-10-CM

## 2021-01-07 DIAGNOSIS — Z79899 Other long term (current) drug therapy: Secondary | ICD-10-CM | POA: Diagnosis not present

## 2021-01-07 DIAGNOSIS — E1165 Type 2 diabetes mellitus with hyperglycemia: Secondary | ICD-10-CM | POA: Diagnosis not present

## 2021-01-07 DIAGNOSIS — E11628 Type 2 diabetes mellitus with other skin complications: Secondary | ICD-10-CM | POA: Diagnosis present

## 2021-01-07 LAB — SURGICAL PCR SCREEN
MRSA, PCR: NEGATIVE
Staphylococcus aureus: NEGATIVE

## 2021-01-07 LAB — CBG MONITORING, ED
Glucose-Capillary: 189 mg/dL — ABNORMAL HIGH (ref 70–99)
Glucose-Capillary: 195 mg/dL — ABNORMAL HIGH (ref 70–99)
Glucose-Capillary: 126 mg/dL — ABNORMAL HIGH (ref 70–99)
Glucose-Capillary: 128 mg/dL — ABNORMAL HIGH (ref 70–99)
Glucose-Capillary: 178 mg/dL — ABNORMAL HIGH (ref 70–99)

## 2021-01-07 LAB — LACTIC ACID, PLASMA: Lactic Acid, Venous: 0.8 mmol/L (ref 0.5–1.9)

## 2021-01-07 LAB — GLUCOSE, CAPILLARY
Glucose-Capillary: 251 mg/dL — ABNORMAL HIGH (ref 70–99)
Glucose-Capillary: 206 mg/dL — ABNORMAL HIGH (ref 70–99)

## 2021-01-07 MED ORDER — MORPHINE SULFATE (PF) 2 MG/ML IV SOLN
2.0000 mg | INTRAVENOUS | Status: DC | PRN
Start: 2021-01-07 — End: 2021-01-15

## 2021-01-07 MED ORDER — ACETAMINOPHEN 325 MG PO TABS: 650.0000 mg | ORAL_TABLET | Freq: Four times a day (QID) | ORAL | Status: DC | PRN

## 2021-01-07 MED ORDER — HYDRALAZINE HCL 25 MG PO TABS: 25.0000 mg | ORAL_TABLET | Freq: Four times a day (QID) | ORAL | Status: DC | PRN

## 2021-01-07 MED ORDER — INSULIN ASPART 100 UNIT/ML IJ SOLN
0.0000 [IU] | Freq: Three times a day (TID) | INTRAMUSCULAR | Status: DC
  Administered 2021-01-07: 2 [IU] via SUBCUTANEOUS
  Administered 2021-01-07: 3 [IU] via SUBCUTANEOUS
  Administered 2021-01-08 (×2): 5 [IU] via SUBCUTANEOUS
  Filled 2021-01-07 (×4): qty 1

## 2021-01-07 MED ORDER — ONDANSETRON HCL 4 MG PO TABS: 4.0000 mg | ORAL_TABLET | Freq: Four times a day (QID) | ORAL | Status: DC | PRN

## 2021-01-07 MED ORDER — SODIUM CHLORIDE 0.9 % IV SOLN
2.0000 g | Freq: Three times a day (TID) | INTRAVENOUS | Status: AC
  Administered 2021-01-07 – 2021-01-13 (×20): 2 g via INTRAVENOUS
  Filled 2021-01-07 (×23): qty 2

## 2021-01-07 MED ORDER — MUPIROCIN 2 % EX OINT
1.0000 "application " | TOPICAL_OINTMENT | Freq: Two times a day (BID) | CUTANEOUS | Status: DC
  Administered 2021-01-07 – 2021-01-08 (×2): 1 via NASAL
  Filled 2021-01-07: qty 22

## 2021-01-07 MED ORDER — VANCOMYCIN HCL 1250 MG/250ML IV SOLN
1250.0000 mg | Freq: Two times a day (BID) | INTRAVENOUS | Status: AC
  Administered 2021-01-07 – 2021-01-13 (×13): 1250 mg via INTRAVENOUS
  Filled 2021-01-07 (×17): qty 250

## 2021-01-07 MED ORDER — ONDANSETRON HCL 4 MG/2ML IJ SOLN
4.0000 mg | Freq: Four times a day (QID) | INTRAMUSCULAR | Status: DC | PRN
  Administered 2021-01-08: 4 mg via INTRAVENOUS

## 2021-01-07 MED ORDER — INSULIN ASPART 100 UNIT/ML IJ SOLN
0.0000 [IU] | INTRAMUSCULAR | Status: DC
  Administered 2021-01-07 (×2): 3 [IU] via SUBCUTANEOUS
  Filled 2021-01-07 (×2): qty 1

## 2021-01-07 MED ORDER — VANCOMYCIN HCL 2000 MG/400ML IV SOLN
2000.0000 mg | Freq: Once | INTRAVENOUS | Status: AC
  Administered 2021-01-07: 2000 mg via INTRAVENOUS
  Filled 2021-01-07: qty 400

## 2021-01-07 MED ORDER — HYDROCODONE-ACETAMINOPHEN 5-325 MG PO TABS
1.0000 | ORAL_TABLET | ORAL | Status: DC | PRN
  Administered 2021-01-07: 1 via ORAL
  Administered 2021-01-08 – 2021-01-14 (×9): 2 via ORAL
  Filled 2021-01-07 (×7): qty 2
  Filled 2021-01-07: qty 1
  Filled 2021-01-07 (×2): qty 2

## 2021-01-07 MED ORDER — ACETAMINOPHEN 650 MG RE SUPP: 650.0000 mg | Freq: Four times a day (QID) | RECTAL | Status: DC | PRN

## 2021-01-07 NOTE — H&P (Signed)
History and Physical    Fernando Vega EHU:[id number] DOB: [birth year] DOA: 01/06/2021  PCP: Langston Reusing, NP   Patient coming from: home  I have personally briefly reviewed patient's old medical records in [place]  Chief Complaint: wound infection right foot  HPI: Fernando Vega is a 50 y.o. 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  Patient has moderate intensity throbbing pain at the stump . He denies fever or chills. He is otherwise in his usual state of health  ED course: On arrival, afebrile and vitals within normal limits WBC normal at 8000 with lactic acid 1.2.  Hemoglobin 11 which is about his baseline  MRI right foot showing findings suggestive of acute osteomyelitis and soft tissue enhancement concerning for tissue necrosis.  No organized or drainable fluid collection  Patient started on Maxipime and Cipro and hospitalist consulted for admission.  Review of Systems: As per HPI otherwise all other systems on review of systems negative.    Past Medical History:  Diagnosis Date   Diabetes mellitus without complication ([place])    Neuropathy    Sarcoidosis    Sleep apnea     Past Surgical History:  Procedure Laterality Date   AMPUTATION Right 12/13/2020   Procedure: AMPUTATION midFOOT-RIGHT;  Surgeon: Criselda Peaches, DPM;  Location: [facility];  Service: Podiatry;  Laterality: Right;   AMPUTATION Right 11/05/2020   Procedure: AMPUTATION RAY-Partial Ray Amp 2nd & 3rd Digit;  Surgeon: Edrick Kins, DPM;  Location: [facility];  Service: Podiatry;  Laterality: Right;   APPLICATION OF WOUND VAC Right 11/09/2020   Procedure: APPLICATION OF WOUND VAC;  Surgeon: Edrick Kins, DPM;  Location: [facility];  Service: Podiatry;  Laterality: Right;   INCISION  AND DRAINAGE Right 11/01/2020   Procedure: INCISION AND DRAINAGE-Right Foot;  Surgeon: Criselda Peaches, DPM;  Location: [facility];  Service: Podiatry;  Laterality: Right;   IRRIGATION AND DEBRIDEMENT FOOT Right 11/09/2020   Procedure: IRRIGATION AND DEBRIDEMENT FOOT;  Surgeon: Edrick Kins, DPM;  Location: [facility];  Service: Podiatry;  Laterality: Right;   IRRIGATION AND DEBRIDEMENT FOOT Right 12/13/2020   Procedure: IRRIGATION AND DEBRIDEMENT FOOT;  Surgeon: Criselda Peaches, DPM;  Location: [facility];  Service: Podiatry;  Laterality: Right;   WOUND DEBRIDEMENT Right 12/16/2020   Procedure: DEBRIDEMENT WOUND;  Surgeon: Felipa Furnace, DPM;  Location: [facility];  Service: Podiatry;  Laterality: Right;  Transmet amputation washout & closure     reports that he has never smoked. He has never used smokeless tobacco. He reports that he does not drink alcohol and does not use drugs.  No Known Allergies  Family History  Problem Relation Age of Onset   Hypertension Mother    Diabetes type II Father    Parkinson's disease Father    Heart disease Father       Prior to Admission medications   Medication Sig Start Date End Date Taking? Authorizing Provider  acetaminophen (TYLENOL) 500 MG tablet Take 1 tablet (500 mg total) by mouth every 6 (six) hours as needed for mild pain (or Fever >/= 101). 12/19/20   Cherene Altes, MD  albuterol (VENTOLIN HFA) 108 (90 Base) MCG/ACT inhaler Inhale 2 puffs into the lungs once every 6 (  six) hours as needed for wheezing or shortness of breath. 11/12/20   Loletha Grayer, MD  amLODipine (NORVASC) 10 MG tablet Take 1 tablet (10 mg total) by mouth once daily. 12/30/20   Iloabachie, Chioma E, NP  ascorbic acid (VITAMIN C) 250 MG tablet Take 1 tablet (250 mg total) by mouth once daily. 11/13/20   Loletha Grayer, MD  ciprofloxacin (CIPRO) 500 MG tablet Take 1 tablet (500 mg total) by mouth 2 (two) times daily for 28 days. 12/19/20 01/17/21  Cherene Altes, MD   doxycycline (VIBRA-TABS) 100 MG tablet Take 1 tablet (100 mg total) by mouth 2 (two) times daily. 12/24/20   Felipa Furnace, DPM  gabapentin (NEURONTIN) 300 MG capsule Take 1 capsule (300 mg total) by mouth once daily at bedtime. 12/30/20   Iloabachie, Chioma E, NP  Insulin Glargine (BASAGLAR KWIKPEN) 100 UNIT/ML Inject 15 Units into the skin at bedtime. 12/30/20   Iloabachie, Chioma E, NP  insulin lispro (HUMALOG KWIKPEN) 100 UNIT/ML KwikPen Inject 10 Units into the skin 3 (three) times daily before meals. BG 201-250 Add 1 unit =11 units; BG 251-300 Add 2 units =12 units BG 301- 350 Add 3 units =13 units BG 351-400 Add 4 units =14 units If over 400 units add 5 units, recheck in 2 hours if still over 400 go to the hospital. 12/30/20   Iloabachie, Chioma E, NP  Insulin Pen Needle 32G X 6 MM MISC USE AS DIRECTED WITH INSULIN PENS. 12/30/20   Iloabachie, Chioma E, NP  Multiple Vitamin (MULTIVITAMIN WITH MINERALS) TABS tablet Take 1 tablet by mouth once daily. 11/13/20   Loletha Grayer, MD  pantoprazole (PROTONIX) 40 MG tablet Take 1 tablet (40 mg total) by mouth once daily. 12/30/20   Iloabachie, Chioma E, NP  traMADol (ULTRAM) 50 MG tablet Take 1 tablet (50 mg total) by mouth every 6 (six) hours as needed for moderate pain. 12/19/20   Cherene Altes, MD    Physical Exam: Vitals:   01/06/21 1704  Weight: 85.7 kg  Height: 5\' 7"  (1.702 m)     Vitals:   01/06/21 1704  Weight: 85.7 kg  Height: 5\' 7"  (1.702 m)      Constitutional: Alert and oriented x 3 . Not in any apparent distress HEENT:      Head: Normocephalic and atraumatic.         Eyes: PERLA, EOMI, Conjunctivae are normal. Sclera is non-icteric.       Mouth/Throat: Mucous membranes are moist.       Neck: Supple with no signs of meningismus. Cardiovascular: Regular rate and rhythm. No murmurs, gallops, or rubs. 2+ symmetrical distal pulses are present . No JVD. No LE edema Respiratory: Respiratory effort normal .Lungs sounds  clear bilaterally. No wheezes, crackles, or rhonchi.  Gastrointestinal: Soft, non tender, and non distended with positive bowel sounds.  Genitourinary: No CVA tenderness. Musculoskeletal: Necrotic appearing tissue at amputation stump with surrounding erythema.    Neurologic:  Face is symmetric. Moving all extremities. No gross focal neurologic deficits . Skin: Skin is warm, dry.  See description under musculoskeletal in picture above psychiatric: Mood and affect are normal    Labs on Admission: I have personally reviewed following labs and imaging studies  CBC: Recent Labs  Lab 01/06/21 1735  WBC 8.0  NEUTROABS 4.2  HGB 11.0*  HCT 34.4*  MCV 84.3  PLT 376*   Basic Metabolic Panel: Recent Labs  Lab 01/06/21 1735  NA 137  K 3.7  CL 100  CO2 27  GLUCOSE 281*  BUN 19  CREATININE 0.81  CALCIUM 8.9   GFR: Estimated Creatinine Clearance: 111.5 mL/min (by C-G formula based on SCr of 0.81 mg/dL). Liver Function Tests: No results for input(s): AST, ALT, ALKPHOS, BILITOT, PROT, ALBUMIN in the last 168 hours. No results for input(s): LIPASE, AMYLASE in the last 168 hours. No results for input(s): AMMONIA in the last 168 hours. Coagulation Profile: No results for input(s): INR, PROTIME in the last 168 hours. Cardiac Enzymes: No results for input(s): CKTOTAL, CKMB, CKMBINDEX, TROPONINI in the last 168 hours. BNP (last 3 results) No results for input(s): PROBNP in the last 8760 hours. HbA1C: No results for input(s): HGBA1C in the last 72 hours. CBG: No results for input(s): GLUCAP in the last 168 hours. Lipid Profile: No results for input(s): CHOL, HDL, LDLCALC, TRIG, CHOLHDL, LDLDIRECT in the last 72 hours. Thyroid Function Tests: No results for input(s): TSH, T4TOTAL, FREET4, T3FREE, THYROIDAB in the last 72 hours. Anemia Panel: No results for input(s): VITAMINB12, FOLATE, FERRITIN, TIBC, IRON, RETICCTPCT in the last 72 hours. Urine analysis:    Component Value  Date/Time   COLORURINE YELLOW 11/01/2020 [place] 11/01/2020 1026   LABSPEC 1.025 11/01/2020 1026   PHURINE 5.5 11/01/2020 1026   GLUCOSEU 500 (A) 11/01/2020 1026   HGBUR TRACE (A) 11/01/2020 1026   BILIRUBINUR NEGATIVE 11/01/2020 1026   KETONESUR 80 (A) 11/01/2020 1026   PROTEINUR NEGATIVE 11/01/2020 1026   NITRITE NEGATIVE 11/01/2020 1026   LEUKOCYTESUR NEGATIVE 11/01/2020 1026    Radiological Exams on Admission: MR FOOT RIGHT W WO CONTRAST  Result Date: 01/06/2021 CLINICAL DATA:  History of transmetatarsal amputation of the right foot. Now with exposed bone and concern for infection EXAM: MRI OF THE RIGHT FOREFOOT WITHOUT AND WITH CONTRAST TECHNIQUE: Multiplanar, multisequence MR imaging of the right foot was performed before and after the administration of intravenous contrast. CONTRAST:  7.6mL GADAVIST GADOBUTROL 1 MMOL/ML IV SOLN COMPARISON:  X-ray 12/14/2020 FINDINGS: Bones/Joint/Cartilage Postsurgical changes from prior right foot amputation including transmetatarsal amputation of the first, fourth, and fifth metatarsals and complete second and third ray resections to the level of the TMT joints. Bone marrow edema is present throughout the residual first, fourth, and fifth metatarsals with enhancement. Confluent low T1 signal changes involving the base of the first metatarsal. Bone marrow edema and enhancement involving the medial, intermediate, and lateral cuneiform bones with probable erosions involving the intermediate and lateral cuneiform as well as its patchy areas of decreased T1 bone marrow signal. Bone marrow edema is also present within the cuboid and navicular bones of the midfoot with areas of low T1 marrow signal changes including along the lateral margin of the cuboid and medial margin of the navicular. Marrow signal within the visualized calcaneus, talus, distal tibia, and distal fibula are preserved. No acute fracture or dislocation. Ligaments No evidence of  acute ligamentous disruption within the midfoot. Muscles and Tendons Post amputation changes of the flexor and extensor tendons of the foot. No tenosynovitis. Diffuse edema-like signal within the foot musculature, which may represent a combination of denervation changes and myositis. Soft tissues Soft tissue wound or ulceration along the dorsal margin of the stump. Irregular areas of soft tissue hypoenhancement along the distal stump concerning for tissue necrosis. No organized or drainable fluid collection. IMPRESSION: 1. Postsurgical changes from prior right foot amputation. 2. Bone marrow signal changes involving portions of the residual first, fourth, and fifth metatarsals as well as  each of the cuneiform bones and also the cuboid and navicular are suggestive of acute osteomyelitis. 3. Soft tissue wound or ulceration along the dorsal margin of the stump. Irregular areas of soft tissue hypoenhancement along the distal stump concerning for tissue necrosis. No organized or drainable fluid collection. Electronically Signed   By: Davina Poke D.O.   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 for possible procedure    Type 2 diabetes mellitus with peripheral neuropathy (H[facility]) - Sliding scale insulin coverage    Sleep apnea -CPAP    Essential hypertension - Continue home antihypertensives    DVT prophylaxis: SCDs  code Status: full code  Family Communication:  none  Disposition Plan: Back to previous home environment Consults called: Podiatry Status:At the time of admission, it appears that the appropriate admission status for this patient is INPATIENT. This is judged to be reasonable and necessary in  order to provide the required intensity of service to ensure the patient's safety given the presenting symptoms, physical exam findings, and initial radiographic and laboratory data in the context of their  Comorbid conditions.   Patient requires inpatient status due to high intensity of service, high risk for further deterioration and high frequency of surveillance required.   I certify that at the point of admission it is my clinical judgment that the patient will require inpatient hospital care spanning beyond [place] MD [facility]     01/07/2021, 12:30 AM

## 2021-01-07 NOTE — ED Notes (Signed)
This RN contacted Dr Posey Pronto to attempt to get update on possible surgery. Awaiting repsonse

## 2021-01-07 NOTE — Progress Notes (Signed)
MRI reviewed. I have called the patient to discuss findings. Due to scheduling will plan on surgery tomorrow at 8am. Patient is aware and we will be by later to see him.

## 2021-01-07 NOTE — Progress Notes (Signed)
Pharmacy Antibiotic Note  Fernando Vega is a 50 y.o. male admitted on 01/06/2021 with  osteomyelitis .  Pharmacy has been consulted for Vancomycin , Cefepime  dosing.  Plan: Cefepime 2 gm IV Q8H ordered to start on 10/28 @ 0100.   Vancomycin 2 gm IV X 1 ordered to start on 10/28 @ ~ 0200. Vancomycin 1250 mg IV Q12H ordered to start on 10/28 @ 1400.   AUC = 464.7 T1/2 = 7.9 hrs  Vanc trough = 12.5 mg/mL   Height: 5\' 7"  (170.2 cm) Weight: 85.7 kg (189 lb) IBW/kg (Calculated) : 66.1  No data recorded.  Recent Labs  Lab 01/06/21 1735  WBC 8.0  CREATININE 0.81  LATICACIDVEN 1.2    Estimated Creatinine Clearance: 111.5 mL/min (by C-G formula based on SCr of 0.81 mg/dL).    No Known Allergies  Antimicrobials this admission:   >>    >>   Dose adjustments this admission:   Microbiology results:  BCx:   UCx:    Sputum:    MRSA PCR:   Thank you for allowing pharmacy to be a part of this patient's care.  Bridgid Printz D 01/07/2021 12:55 AM

## 2021-01-07 NOTE — ED Notes (Signed)
Called report to [place], [place]

## 2021-01-07 NOTE — Progress Notes (Signed)
PHARMACY -  BRIEF ANTIBIOTIC NOTE   Pharmacy has received consult(s) for Cefepime from an ED provider.  The patient's profile has been reviewed for ht/wt/allergies/indication/available labs.    One time order(s) placed for Cefepime 2 gm IV X 1  Further antibiotics/pharmacy consults should be ordered by admitting physician if indicated.                       Thank you, Stillman Buenger D 01/07/2021  12:05 AM

## 2021-01-07 NOTE — ED Notes (Signed)
Informed RN bed assigned 

## 2021-01-07 NOTE — Progress Notes (Signed)
RN applied betadine soaked gauze to right food wound, wrapped with kirlex, and wrapped with Ace bandage. Pt. Tolerated well.

## 2021-01-07 NOTE — ED Notes (Signed)
Pt assisted to restroom via w/c.  Alert and oriented.

## 2021-01-07 NOTE — Progress Notes (Signed)
PROGRESS NOTE    Fernando Vega  UDJ:[id number] DOB: 07/05/1970 DOA: 01/06/2021 PCP: Langston Reusing, NP    Brief Narrative:  Fernando Vega is a 50 year old male with past medical history significant for DM2, essential hypertension, OSA on CPAP, with history of diabetic foot wound/osteomyelitis with recent transmetatarsal amputation on 10/3 by podiatry who presents to [facility] ED by discretion of his podiatrist for wound dehiscence, cellulitis and bone exposure.  Patient reported moderate intensity pain and throbbing at his stump site.  Denies fever/chills.  No other complaints at the time.  In the ED, temperature 98.3 F, HR 89, RR 16, BP 136/77, SPO2 98% on room air.  Sodium 137, potassium 3.7, chloride 100, CO2 27, BUN 19, creatinine 0.1, glucose 281.  Lactic acid 1.2, WBC count 8.0, hemoglobin 11.0, platelets 444.  Cova-19 PCR negative.  Influenza A/B PCR negative.  MR right foot with and without contrast with findings suggestive of acute osteomyelitis to residual first/fourth/fifth metatarsals, soft tissue wound ulceration concerning for tissue necrosis, no abscess/fluid collection identified.  EDP consulted hospitalist service for further evaluation and treatment of acute osteomyelitis right foot/stump.   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  MR right foot with and without contrast with findings suggestive of acute osteomyelitis to residual first/fourth/fifth metatarsals associated with tissue necrosis and no abscess. --Podiatry following, appreciate assistance --Vancomycin, pharmacy  consulted for dosing/monitoring --Cefepime 2 g IV every 8 hours --Norco 5-325 mg 1-2 tablets q4h PRN moderate pain --Morphine 2 mg IV q2h PRN severe pain --Podiatry plans surgical intervention on 10/29, n.p.o. after midnight  Type 2 diabetes mellitus, with hyperglycemia Hemoglobin A1c 8.0 on 12/11/2020, not optimally controlled. (But improved since 11/01/2020 with A1c 11.8).  Home regimen includes insulin glargine 15 units subcutaneously daily, Humalog sliding scale. --Moderate SSI for coverage --CBGs before every meal/at bedtime  OSA: Continue nocturnal CPAP  Essential hypertension Currently not on any antihypertensives at home. --Continue monitor BP closely --Hydralazine 25 mg PO q6h prn SBP >170 or DBP >110   DVT prophylaxis: SCDs Start: 01/07/21 0026   Code Status: Full Code Family Communication: No family present this morning  Disposition Plan:  Level of care: Med-Surg Status is: Inpatient  Remains inpatient appropriate because: Acute osteomyelitis requiring surgical invention, IV antibiotics    Consultants:  Podiatry, Dr. Jacqualyn Posey  Procedures:  None  Antimicrobials:  Vancomycin 10/27>> Cefepime 10/27>> Cipro 10/27 - 10/27   Subjective: Patient seen examined at bedside, resting comfortably.  Remains in ED holding area/hallway.  No significant complaints other than wishes he could be in a room right now.  Awaiting surgical intervention planned for tomorrow per podiatry.  Denies headache, no fever/chills/night sweats, no nausea/vomiting/diarrhea, no chest pain, palpitations, no shortness of breath, no abdominal pain, no weakness, no fatigue, no paresthesias.  No acute events overnight per nursing.  Objective: Vitals:   01/06/21 1704 01/07/21 0139 01/07/21 0535 01/07/21 1011  BP:  136/77 129/80 130/85  Pulse:  89 80 88  Resp:  16 16 20   Temp:  98.3 F (36.8 C)  98.1 F (36.7 C)  TempSrc:  Oral  Oral  SpO2:  98% 99% 98%  Weight: 85.7 kg     Height: 5\' 7"  (1.702  m)      No intake or output data in the 24 hours ending 01/07/21 1601 Filed Weights   01/06/21 1704  Weight: 85.7 kg    Examination:  General exam: Appears calm and comfortable  Respiratory system: Clear to auscultation. Respiratory effort normal.  On room air Cardiovascular system: S1 & S2 heard, RRR. No JVD, murmurs, rubs, gallops or clicks.  1+ pitting edema bilateral lower extremities. Gastrointestinal system: Abdomen is nondistended, soft and nontender. No organomegaly or masses felt. Normal bowel sounds heard. Central nervous system: Alert and oriented. No focal neurological deficits. Extremities: Moves all extremities independently, strength preserved, necrotic appearing tissue amputation stump with surrounding erythema. Skin: No rashes, lesions or ulcers Psychiatry: Judgement and insight appear normal. Mood & affect appropriate.      Data Reviewed: I have personally reviewed following labs and imaging studies  CBC: Recent Labs  Lab 01/06/21 1735  WBC 8.0  NEUTROABS 4.2  HGB 11.0*  HCT 34.4*  MCV 84.3  PLT 759*   Basic Metabolic Panel: Recent Labs  Lab 01/06/21 1735  NA 137  K 3.7  CL 100  CO2 27  GLUCOSE 281*  BUN 19  CREATININE 0.81  CALCIUM 8.9   GFR: Estimated Creatinine Clearance: 111.5 mL/min (by C-G formula based on SCr of 0.81 mg/dL). Liver Function Tests: No results for input(s): AST, ALT, ALKPHOS, BILITOT, PROT, ALBUMIN in the last 168 hours. No results for input(s): LIPASE, AMYLASE in the last 168 hours. No results for input(s): AMMONIA in the last 168 hours. Coagulation Profile: No results for input(s): INR, PROTIME in the last 168 hours. Cardiac Enzymes: No results for input(s): CKTOTAL, CKMB, CKMBINDEX, TROPONINI in the last 168 hours. BNP (last 3 results) No results for input(s): PROBNP in the last 8760 hours. HbA1C: No results for input(s): HGBA1C in the last 72 hours. CBG: Recent Labs  Lab 01/07/21 0125 01/07/21 0459  01/07/21 0816 01/07/21 1157  GLUCAP 189* 195* 126* 128*   Lipid Profile: No results for input(s): CHOL, HDL, LDLCALC, TRIG, CHOLHDL, LDLDIRECT in the last 72 hours. Thyroid Function Tests: No results for input(s): TSH, T4TOTAL, FREET4, T3FREE, THYROIDAB in the last 72 hours. Anemia Panel: No results for input(s): VITAMINB12, FOLATE, FERRITIN, TIBC, IRON, RETICCTPCT in the last 72 hours. Sepsis Labs: Recent Labs  Lab 01/06/21 1735 01/07/21 0701  LATICACIDVEN 1.2 0.8    Recent Results (from the past 240 hour(s))  Culture, blood (routine x 2)     Status: None (Preliminary result)   Collection Time: 01/06/21  5:35 PM   Specimen: BLOOD  Result Value Ref Range Status   Specimen Description BLOOD LEFT ANTECUBITAL  Final   Special Requests   Final    BOTTLES DRAWN AEROBIC AND ANAEROBIC Blood Culture adequate volume   Culture   Final    NO GROWTH < 12 HOURS Performed at [facility], [address] [place], [place] [address]    Report Status PENDING  Incomplete  Resp Panel by RT-PCR (Flu A&B, Covid) Nasopharyngeal Swab     Status: None   Collection Time: 01/06/21  5:35 PM   Specimen: Nasopharyngeal Swab; Nasopharyngeal(NP) swabs in vial transport medium  Result Value Ref Range Status   SARS Coronavirus 2 by RT PCR NEGATIVE NEGATIVE Final    Comment: (NOTE) SARS-CoV-2 target nucleic acids are NOT DETECTED.  The SARS-CoV-2 RNA is generally  detectable in upper respiratory specimens during the acute phase of infection. The lowest concentration of SARS-CoV-2 viral copies this assay can detect is 138 copies/mL. A negative result does not preclude SARS-Cov-2 infection and should not be used as the sole basis for treatment or other patient management decisions. A negative result may occur with  improper specimen collection/handling, submission of specimen other than nasopharyngeal swab, presence of viral mutation(s) within the areas targeted by this assay, and inadequate  number of viral copies(<138 copies/mL). A negative result must be combined with clinical observations, patient history, and epidemiological information. The expected result is Negative.  Fact Sheet for Patients:  EntrepreneurPulse.com.au  Fact Sheet for Healthcare Providers:  IncredibleEmployment.be  This test is no t yet approved or cleared by the [place] FDA and  has been authorized for detection and/or diagnosis of SARS-CoV-2 by FDA under an Emergency Use Authorization (EUA). This EUA will remain  in effect (meaning this test can be used) for the duration of the COVID-19 declaration under Section 564(b)(1) of the Act, 21 U.S.C.section 360bbb-3(b)(1), unless the authorization is terminated  or revoked sooner.       Influenza A by PCR NEGATIVE NEGATIVE Final   Influenza B by PCR NEGATIVE NEGATIVE Final    Comment: (NOTE) The Xpert Xpress SARS-CoV-2/FLU/RSV plus assay is intended as an aid in the diagnosis of influenza from Nasopharyngeal swab specimens and should not be used as a sole basis for treatment. Nasal washings and aspirates are unacceptable for Xpert Xpress SARS-CoV-2/FLU/RSV testing.  Fact Sheet for Patients: EntrepreneurPulse.com.au  Fact Sheet for Healthcare Providers: IncredibleEmployment.be  This test is not yet approved or cleared by the [place] FDA and has been authorized for detection and/or diagnosis of SARS-CoV-2 by FDA under an Emergency Use Authorization (EUA). This EUA will remain in effect (meaning this test can be used) for the duration of the COVID-19 declaration under Section 564(b)(1) of the Act, 21 U.S.C. section 360bbb-3(b)(1), unless the authorization is terminated or revoked.  Performed at [facility], [address] [place], [place] [address]   Culture, blood (routine x 2)     Status: None (Preliminary result)   Collection Time: 01/06/21  5:41  PM   Specimen: BLOOD  Result Value Ref Range Status   Specimen Description BLOOD LEFT ANTECUBITAL  Final   Special Requests   Final    BOTTLES DRAWN AEROBIC AND ANAEROBIC Blood Culture adequate volume   Culture   Final    NO GROWTH < 12 HOURS Performed at [facility], [address] [place], [place] [address]    Report Status PENDING  Incomplete         Radiology Studies: MR FOOT RIGHT W WO CONTRAST  Result Date: 01/06/2021 CLINICAL DATA:  History of transmetatarsal amputation of the right foot. Now with exposed bone and concern for infection EXAM: MRI OF THE RIGHT FOREFOOT WITHOUT AND WITH CONTRAST TECHNIQUE: Multiplanar, multisequence MR imaging of the right foot was performed before and after the administration of intravenous contrast. CONTRAST:  7.65mL GADAVIST GADOBUTROL 1 MMOL/ML IV SOLN COMPARISON:  X-ray 12/14/2020 FINDINGS: Bones/Joint/Cartilage Postsurgical changes from prior right foot amputation including transmetatarsal amputation of the first, fourth, and fifth metatarsals and complete second and third ray resections to the level of the TMT joints. Bone marrow edema is present throughout the residual first, fourth, and fifth metatarsals with enhancement. Confluent low T1 signal changes involving the base of the first metatarsal. Bone marrow edema and enhancement involving the medial, intermediate,  and lateral cuneiform bones with probable erosions involving the intermediate and lateral cuneiform as well as its patchy areas of decreased T1 bone marrow signal. Bone marrow edema is also present within the cuboid and navicular bones of the midfoot with areas of low T1 marrow signal changes including along the lateral margin of the cuboid and medial margin of the navicular. Marrow signal within the visualized calcaneus, talus, distal tibia, and distal fibula are preserved. No acute fracture or dislocation. Ligaments No evidence of acute ligamentous disruption within the  midfoot. Muscles and Tendons Post amputation changes of the flexor and extensor tendons of the foot. No tenosynovitis. Diffuse edema-like signal within the foot musculature, which may represent a combination of denervation changes and myositis. Soft tissues Soft tissue wound or ulceration along the dorsal margin of the stump. Irregular areas of soft tissue hypoenhancement along the distal stump concerning for tissue necrosis. No organized or drainable fluid collection. IMPRESSION: 1. Postsurgical changes from prior right foot amputation. 2. Bone marrow signal changes involving portions of the residual first, fourth, and fifth metatarsals as well as each of the cuneiform bones and also the cuboid and navicular are suggestive of acute osteomyelitis. 3. Soft tissue wound or ulceration along the dorsal margin of the stump. Irregular areas of soft tissue hypoenhancement along the distal stump concerning for tissue necrosis. No organized or drainable fluid collection. Electronically Signed   By: Davina Poke D.O.   On: 01/06/2021 19:01        Scheduled Meds:  insulin aspart  0-15 Units Subcutaneous TID WC   Continuous Infusions:  ceFEPime (MAXIPIME) IV Stopped (01/07/21 1012)   vancomycin 1,250 mg (01/07/21 1404)     LOS: 0 days    Time spent: 42 minutes spent on chart review, discussion with nursing staff, consultants, updating family and interview/physical exam; more than 50% of that time was spent in counseling and/or coordination of care.    Sarabeth Benton J [place] DO [facility] Available via Epic secure chat 7am-7pm After these hours, please refer to coverage provider listed on [email or web address] 01/07/2021, 4:01 PM

## 2021-01-08 ENCOUNTER — Inpatient Hospital Stay: Payer: Medicaid Other | Admitting: Anesthesiology

## 2021-01-08 ENCOUNTER — Encounter: Payer: Self-pay | Admitting: Internal Medicine

## 2021-01-08 ENCOUNTER — Encounter
Admission: EM | Disposition: A | Discharge status: Home / Self Care | Payer: Self-pay | Source: Home / Self Care | Attending: Internal Medicine

## 2021-01-08 ENCOUNTER — Inpatient Hospital Stay: Payer: Medicaid Other

## 2021-01-08 DIAGNOSIS — M86171 Other acute osteomyelitis, right ankle and foot: Secondary | ICD-10-CM

## 2021-01-08 HISTORY — PX: BONE BIOPSY: SHX375

## 2021-01-08 HISTORY — PX: WOUND DEBRIDEMENT: SHX247

## 2021-01-08 HISTORY — PX: TRANSMETATARSAL AMPUTATION: SHX6197

## 2021-01-08 LAB — GLUCOSE, CAPILLARY
Glucose-Capillary: 198 mg/dL — ABNORMAL HIGH (ref 70–99)
Glucose-Capillary: 165 mg/dL — ABNORMAL HIGH (ref 70–99)
Glucose-Capillary: 215 mg/dL — ABNORMAL HIGH (ref 70–99)
Glucose-Capillary: 217 mg/dL — ABNORMAL HIGH (ref 70–99)
Glucose-Capillary: 258 mg/dL — ABNORMAL HIGH (ref 70–99)

## 2021-01-08 SURGERY — DEBRIDEMENT, WOUND
Anesthesia: Monitor Anesthesia Care | Site: Foot | Laterality: Right

## 2021-01-08 MED ORDER — 0.9 % SODIUM CHLORIDE (POUR BTL) OPTIME
TOPICAL | Status: DC | PRN
  Administered 2021-01-08: 500 mL

## 2021-01-08 MED ORDER — FENTANYL CITRATE (PF) 100 MCG/2ML IJ SOLN
25.0000 ug | INTRAMUSCULAR | Status: DC | PRN
  Administered 2021-01-08: 50 ug via INTRAVENOUS

## 2021-01-08 MED ORDER — ACETAMINOPHEN 325 MG PO TABS: 325.0000 mg | ORAL_TABLET | ORAL | Status: DC | PRN

## 2021-01-08 MED ORDER — MEPERIDINE HCL 25 MG/ML IJ SOLN: 6.2500 mg | INTRAMUSCULAR | Status: DC | PRN

## 2021-01-08 MED ORDER — MIDAZOLAM HCL 2 MG/2ML IJ SOLN
INTRAMUSCULAR | Status: DC | PRN
  Administered 2021-01-08: 2 mg via INTRAVENOUS

## 2021-01-08 MED ORDER — SODIUM CHLORIDE 0.9 % IV SOLN: INTRAVENOUS | Status: DC | PRN

## 2021-01-08 MED ORDER — KETOROLAC TROMETHAMINE 30 MG/ML IJ SOLN
30.0000 mg | Freq: Once | INTRAMUSCULAR | Status: AC | PRN
  Administered 2021-01-08: 30 mg via INTRAVENOUS

## 2021-01-08 MED ORDER — PROMETHAZINE HCL 25 MG/ML IJ SOLN: 6.2500 mg | INTRAMUSCULAR | Status: DC | PRN

## 2021-01-08 MED ORDER — OXYCODONE HCL 5 MG/5ML PO SOLN: 5.0000 mg | Freq: Once | ORAL | Status: DC | PRN

## 2021-01-08 MED ORDER — DROPERIDOL 2.5 MG/ML IJ SOLN
0.6250 mg | Freq: Once | INTRAMUSCULAR | Status: DC | PRN
  Filled 2021-01-08: qty 2

## 2021-01-08 MED ORDER — PROPOFOL 500 MG/50ML IV EMUL
INTRAVENOUS | Status: DC | PRN
  Administered 2021-01-08: 30 ug/kg/min via INTRAVENOUS

## 2021-01-08 MED ORDER — OXYCODONE HCL 5 MG PO TABS: 5.0000 mg | ORAL_TABLET | Freq: Once | ORAL | Status: DC | PRN

## 2021-01-08 MED ORDER — BUPIVACAINE HCL 0.5 % IJ SOLN
INTRAMUSCULAR | Status: DC | PRN
  Administered 2021-01-08: 10 mL

## 2021-01-08 MED ORDER — ACETAMINOPHEN 160 MG/5ML PO SOLN
325.0000 mg | ORAL | Status: DC | PRN
  Filled 2021-01-08: qty 20.3

## 2021-01-08 MED ORDER — FENTANYL CITRATE (PF) 100 MCG/2ML IJ SOLN
INTRAMUSCULAR | Status: DC | PRN
  Administered 2021-01-08: 25 ug via INTRAVENOUS

## 2021-01-08 MED ORDER — LIDOCAINE HCL 1 % IJ SOLN
INTRAMUSCULAR | Status: DC | PRN
  Administered 2021-01-08: 10 mL

## 2021-01-08 SURGICAL SUPPLY — 68 items
BLADE OSCILLATING/SAGITTAL (BLADE) ×1
BLADE SURG 15 STRL LF DISP TIS (BLADE) ×2 IMPLANT
BLADE SURG 15 STRL SS (BLADE) ×1
BLADE SW THK.38XMED LNG THN (BLADE) ×2 IMPLANT
BNDG COHESIVE 4X5 TAN ST LF (GAUZE/BANDAGES/DRESSINGS) IMPLANT
BNDG COHESIVE 6X5 TAN ST LF (GAUZE/BANDAGES/DRESSINGS) IMPLANT
BNDG CONFORM 2 STRL LF (GAUZE/BANDAGES/DRESSINGS) IMPLANT
BNDG CONFORM 3 STRL LF (GAUZE/BANDAGES/DRESSINGS) IMPLANT
BNDG ELASTIC 4X5.8 VLCR STR LF (GAUZE/BANDAGES/DRESSINGS) ×3 IMPLANT
BNDG ESMARK 4X12 TAN STRL LF (GAUZE/BANDAGES/DRESSINGS) IMPLANT
BNDG GAUZE ELAST 4 BULKY (GAUZE/BANDAGES/DRESSINGS) ×3 IMPLANT
CNTNR SPEC 2.5X3XGRAD LEK (MISCELLANEOUS) ×12
CONT SPEC 4OZ STER OR WHT (MISCELLANEOUS) ×6
CONTAINER SPEC 2.5X3XGRAD LEK (MISCELLANEOUS) ×12 IMPLANT
CUFF TOURN DUAL QUICK 18 (TOURNIQUET CUFF) ×3 IMPLANT
CUFF TOURN SGL QUICK 18X4 (TOURNIQUET CUFF) ×3 IMPLANT
CUFF TOURN SGL QUICK 24 (TOURNIQUET CUFF)
CUFF TRNQT CYL 24X4X16.5-23 (TOURNIQUET CUFF) IMPLANT
DRAIN CHANNEL JP 15F RND 16 (MISCELLANEOUS) ×3 IMPLANT
DRSG EMULSION OIL 3X8 NADH (GAUZE/BANDAGES/DRESSINGS) IMPLANT
DRSG PAD ABDOMINAL 8X10 ST (GAUZE/BANDAGES/DRESSINGS) ×3 IMPLANT
DURAPREP 26ML APPLICATOR (WOUND CARE) ×3 IMPLANT
ELECT REM PT RETURN 9FT ADLT (ELECTROSURGICAL) ×3
ELECTRODE REM PT RTRN 9FT ADLT (ELECTROSURGICAL) ×2 IMPLANT
GAUZE 4X4 16PLY ~~LOC~~+RFID DBL (SPONGE) ×9 IMPLANT
GAUZE PACKING 1/4 X5 YD (GAUZE/BANDAGES/DRESSINGS) IMPLANT
GAUZE PACKING IODOFORM 1X5 (PACKING) IMPLANT
GAUZE SPONGE 4X4 12PLY STRL (GAUZE/BANDAGES/DRESSINGS) ×3 IMPLANT
GAUZE XEROFORM 1X8 LF (GAUZE/BANDAGES/DRESSINGS) ×3 IMPLANT
GLOVE SRG 8 PF TXTR STRL LF DI (GLOVE) ×2 IMPLANT
GLOVE SURG ENC TEXT LTX SZ8 (GLOVE) ×3 IMPLANT
GLOVE SURG UNDER POLY LF SZ8 (GLOVE) ×1
GOWN STRL REUS W/ TWL XL LVL3 (GOWN DISPOSABLE) ×2 IMPLANT
GOWN STRL REUS W/TWL MED LVL3 (GOWN DISPOSABLE) ×3 IMPLANT
GOWN STRL REUS W/TWL XL LVL3 (GOWN DISPOSABLE) ×1
HANDPIECE VERSAJET DEBRIDEMENT (MISCELLANEOUS) IMPLANT
IV NS 1000ML (IV SOLUTION)
IV NS 1000ML BAXH (IV SOLUTION) IMPLANT
IV NS IRRIG 3000ML ARTHROMATIC (IV SOLUTION) IMPLANT
KIT TURNOVER KIT A (KITS) ×3 IMPLANT
LABEL OR SOLS (LABEL) ×3 IMPLANT
MANIFOLD NEPTUNE II (INSTRUMENTS) ×3 IMPLANT
NEEDLE BIOPSY JAMSHIDI 11X6 (NEEDLE) ×6 IMPLANT
NEEDLE FILTER BLUNT 18X 1/2SAF (NEEDLE) ×1
NEEDLE FILTER BLUNT 18X1 1/2 (NEEDLE) ×2 IMPLANT
NEEDLE HYPO 25X1 1.5 SAFETY (NEEDLE) ×3 IMPLANT
NS IRRIG 500ML POUR BTL (IV SOLUTION) ×3 IMPLANT
PACK EXTREMITY ARMC (MISCELLANEOUS) ×3 IMPLANT
PAD ABD DERMACEA PRESS 5X9 (GAUZE/BANDAGES/DRESSINGS) ×3 IMPLANT
PULSAVAC PLUS IRRIG FAN TIP (DISPOSABLE)
SOL PREP PVP 2OZ (MISCELLANEOUS)
SOLUTION PREP PVP 2OZ (MISCELLANEOUS) IMPLANT
SPONGE T-LAP 18X18 ~~LOC~~+RFID (SPONGE) ×3 IMPLANT
STAPLER SKIN PROX 35W (STAPLE) ×3 IMPLANT
STOCKINETTE IMPERVIOUS 9X36 MD (GAUZE/BANDAGES/DRESSINGS) ×3 IMPLANT
SUCT RESERVOIR 100CC (MISCELLANEOUS) ×3 IMPLANT
SUT ETHILON 2 0 FS 18 (SUTURE) ×9 IMPLANT
SUT ETHILON 4-0 (SUTURE)
SUT ETHILON 4-0 FS2 18XMFL BLK (SUTURE)
SUT PROLENE 3 0 PS 2 (SUTURE) IMPLANT
SUT VIC AB 3-0 SH 27 (SUTURE)
SUT VIC AB 3-0 SH 27X BRD (SUTURE) IMPLANT
SUT VIC AB 4-0 FS2 27 (SUTURE) IMPLANT
SUTURE ETHLN 4-0 FS2 18XMF BLK (SUTURE) IMPLANT
SWAB CULTURE AMIES ANAERIB BLU (MISCELLANEOUS) ×12 IMPLANT
SYR 10ML LL (SYRINGE) ×6 IMPLANT
TIP FAN IRRIG PULSAVAC PLUS (DISPOSABLE) IMPLANT
WATER STERILE IRR 500ML POUR (IV SOLUTION) IMPLANT

## 2021-01-08 NOTE — Progress Notes (Signed)
I called Fernando Vega to check on him after surgery. He is having some pain. I let him know there is pain medication ordered and he can ask the RN for it if needed. He feels well otherwise. He denies any fevers, chills, chest pain, SOB, headaches/dizziness or any other systemic symptoms. I will see him in the morning for dressing change. He had no questions or concerns.

## 2021-01-08 NOTE — Progress Notes (Signed)
PROGRESS NOTE    Fernando Vega  OBS:[id number] DOB: 11/19/1970 DOA: 01/06/2021 PCP: Langston Reusing, NP    Brief Narrative:  Fernando Vega is a 50 year old male with past medical history significant for DM2, essential hypertension, OSA on CPAP, with history of diabetic foot wound/osteomyelitis with recent transmetatarsal amputation on 10/3 by podiatry who presents to [facility] ED by discretion of his podiatrist for wound dehiscence, cellulitis and bone exposure.  Patient reported moderate intensity pain and throbbing at his stump site.  Denies fever/chills.  No other complaints at the time.  In the ED, temperature 98.3 F, HR 89, RR 16, BP 136/77, SPO2 98% on room air.  Sodium 137, potassium 3.7, chloride 100, CO2 27, BUN 19, creatinine 0.1, glucose 281.  Lactic acid 1.2, WBC count 8.0, hemoglobin 11.0, platelets 444.  Cova-19 PCR negative.  Influenza A/B PCR negative.  MR right foot with and without contrast with findings suggestive of acute osteomyelitis to residual first/fourth/fifth metatarsals, soft tissue wound ulceration concerning for tissue necrosis, no abscess/fluid collection identified.  EDP consulted hospitalist service for further evaluation and treatment of acute osteomyelitis right foot/stump.   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  MR right foot with and without contrast with findings suggestive of acute osteomyelitis to residual first/fourth/fifth metatarsals associated with tissue necrosis and no abscess.  Patient underwent debridement, Lisfranc amputation and bone biopsy by  podiatry Dr. Jacqualyn Posey on 01/08/2021. --Vancomycin, pharmacy consulted for dosing/monitoring --Cefepime 2 g IV every 8 hours --Norco 5-325 mg 1-2 tablets q4h PRN moderate pain --Morphine 2 mg IV q2h PRN severe pain --Nonweightbearing right lower extremity --Continue monitor drain output --Follow-up biopsy/pathology results  Type 2 diabetes mellitus, with hyperglycemia Hemoglobin A1c 8.0 on 12/11/2020, not optimally controlled. (But improved since 11/01/2020 with A1c 11.8).  Home regimen includes insulin glargine 15 units subcutaneously daily, Humalog sliding scale. --Moderate SSI for coverage --CBGs before every meal/at bedtime  OSA: Continue nocturnal CPAP  Essential hypertension Currently not on any antihypertensives at home. --Continue monitor BP closely --Hydralazine 25 mg PO q6h prn SBP >170 or DBP >110   DVT prophylaxis: SCDs Start: 01/07/21 0026   Code Status: Full Code Family Communication: No family present this morning  Disposition Plan:  Level of care: Med-Surg Status is: Inpatient  Remains inpatient appropriate because: Acute osteomyelitis requiring surgical invention, IV antibiotics    Consultants:  Podiatry, Dr. Jacqualyn Posey  Procedures:  Debridement, Lisfranc amputation, biopsy with drain placement, podiatry, Dr. Jacqualyn Posey 10/29  Antimicrobials:  Vancomycin 10/27>> Cefepime 10/27>> Cipro 10/27 - 10/27   Subjective: Patient seen examined at bedside, resting comfortably.  Returned from postop from debridement, amputation and biopsy this morning.  Pain fairly well controlled.  No other complaints or concerns at this time.  To remain inpatient on IV antibiotics until biopsy results return per podiatry.  Denies headache, no fever/chills/night sweats, no nausea/vomiting/diarrhea, no chest pain, palpitations, no shortness of breath, no abdominal pain, no weakness, no fatigue, no paresthesias.  No acute events overnight per nursing.  Objective: Vitals:   01/08/21 1012  01/08/21 1015 01/08/21 1037 01/08/21 1152  BP: 128/79 130/76 123/78 91/63  Pulse:  81 83 80  Resp:  13  20  Temp: (!) 97 F (36.1 C)   98.5 F (36.9 C)  TempSrc:      SpO2:  96% 96% 97%  Weight:      Height:        Intake/Output Summary (Last 24 hours) at 01/08/2021 1355 Last data filed at 01/08/2021 1010 Gross per 24 hour  Intake 939.02 ml  Output 70 ml  Net 869.02 ml   Filed Weights   01/06/21 1704  Weight: 85.7 kg    Examination:  General exam: Appears calm and comfortable  Respiratory system: Clear to auscultation. Respiratory effort normal.  On room air Cardiovascular system: S1 & S2 heard, RRR. No JVD, murmurs, rubs, gallops or clicks.  1+ pitting edema bilateral lower extremities. Gastrointestinal system: Abdomen is nondistended, soft and nontender. No organomegaly or masses felt. Normal bowel sounds heard. Central nervous system: Alert and oriented. No focal neurological deficits. Extremities: Moves all extremities independently, strength preserved, noted right foot with dressing/Ace wrap in place, JP drain noted, dressing clean/dry/intact Skin: No rashes, lesions or ulcers Psychiatry: Judgement and insight appear normal. Mood & affect appropriate.    Data Reviewed: I have personally reviewed following labs and imaging studies  CBC: Recent Labs  Lab 01/06/21 1735  WBC 8.0  NEUTROABS 4.2  HGB 11.0*  HCT 34.4*  MCV 84.3  PLT 854*   Basic Metabolic Panel: Recent Labs  Lab 01/06/21 1735  NA 137  K 3.7  CL 100  CO2 27  GLUCOSE 281*  BUN 19  CREATININE 0.81  CALCIUM 8.9   GFR: Estimated Creatinine Clearance: 111.5 mL/min (by C-G formula based on SCr of 0.81 mg/dL). Liver Function Tests: No results for input(s): AST, ALT, ALKPHOS, BILITOT, PROT, ALBUMIN in the last 168 hours. No results for input(s): LIPASE, AMYLASE in the last 168 hours. No results for input(s): AMMONIA in the last 168 hours. Coagulation Profile: No results for input(s): INR,  PROTIME in the last 168 hours. Cardiac Enzymes: No results for input(s): CKTOTAL, CKMB, CKMBINDEX, TROPONINI in the last 168 hours. BNP (last 3 results) No results for input(s): PROBNP in the last 8760 hours. HbA1C: No results for input(s): HGBA1C in the last 72 hours. CBG: Recent Labs  Lab 01/07/21 2002 01/07/21 2137 01/08/21 0731 01/08/21 0948 01/08/21 1151  GLUCAP 251* 206* 198* 165* 215*   Lipid Profile: No results for input(s): CHOL, HDL, LDLCALC, TRIG, CHOLHDL, LDLDIRECT in the last 72 hours. Thyroid Function Tests: No results for input(s): TSH, T4TOTAL, FREET4, T3FREE, THYROIDAB in the last 72 hours. Anemia Panel: No results for input(s): VITAMINB12, FOLATE, FERRITIN, TIBC, IRON, RETICCTPCT in the last 72 hours. Sepsis Labs: Recent Labs  Lab 01/06/21 1735 01/07/21 0701  LATICACIDVEN 1.2 0.8    Recent Results (from the past 240 hour(s))  Culture, blood (routine x 2)     Status: None (Preliminary result)   Collection Time: 01/06/21  5:35 PM   Specimen: BLOOD  Result Value Ref Range Status   Specimen Description BLOOD LEFT ANTECUBITAL  Final   Special Requests   Final    BOTTLES DRAWN AEROBIC AND ANAEROBIC Blood Culture adequate volume   Culture   Final    NO GROWTH 2 DAYS Performed at [facility], [address] [place], [place] [address]    Report Status PENDING  Incomplete  Resp Panel by RT-PCR (Flu A&B, Covid) Nasopharyngeal Swab     Status: None  Collection Time: 01/06/21  5:35 PM   Specimen: Nasopharyngeal Swab; Nasopharyngeal(NP) swabs in vial transport medium  Result Value Ref Range Status   SARS Coronavirus 2 by RT PCR NEGATIVE NEGATIVE Final    Comment: (NOTE) SARS-CoV-2 target nucleic acids are NOT DETECTED.  The SARS-CoV-2 RNA is generally detectable in upper respiratory specimens during the acute phase of infection. The lowest concentration of SARS-CoV-2 viral copies this assay can detect is 138 copies/mL. A negative result does not  preclude SARS-Cov-2 infection and should not be used as the sole basis for treatment or other patient management decisions. A negative result may occur with  improper specimen collection/handling, submission of specimen other than nasopharyngeal swab, presence of viral mutation(s) within the areas targeted by this assay, and inadequate number of viral copies(<138 copies/mL). A negative result must be combined with clinical observations, patient history, and epidemiological information. The expected result is Negative.  Fact Sheet for Patients:  EntrepreneurPulse.com.au  Fact Sheet for Healthcare Providers:  IncredibleEmployment.be  This test is no t yet approved or cleared by the [place] FDA and  has been authorized for detection and/or diagnosis of SARS-CoV-2 by FDA under an Emergency Use Authorization (EUA). This EUA will remain  in effect (meaning this test can be used) for the duration of the COVID-19 declaration under Section 564(b)(1) of the Act, 21 U.S.C.section 360bbb-3(b)(1), unless the authorization is terminated  or revoked sooner.       Influenza A by PCR NEGATIVE NEGATIVE Final   Influenza B by PCR NEGATIVE NEGATIVE Final    Comment: (NOTE) The Xpert Xpress SARS-CoV-2/FLU/RSV plus assay is intended as an aid in the diagnosis of influenza from Nasopharyngeal swab specimens and should not be used as a sole basis for treatment. Nasal washings and aspirates are unacceptable for Xpert Xpress SARS-CoV-2/FLU/RSV testing.  Fact Sheet for Patients: EntrepreneurPulse.com.au  Fact Sheet for Healthcare Providers: IncredibleEmployment.be  This test is not yet approved or cleared by the [place] FDA and has been authorized for detection and/or diagnosis of SARS-CoV-2 by FDA under an Emergency Use Authorization (EUA). This EUA will remain in effect (meaning this test can be used) for the  duration of the COVID-19 declaration under Section 564(b)(1) of the Act, 21 U.S.C. section 360bbb-3(b)(1), unless the authorization is terminated or revoked.  Performed at [facility], [address] [place], [place] [address]   Culture, blood (routine x 2)     Status: None (Preliminary result)   Collection Time: 01/06/21  5:41 PM   Specimen: BLOOD  Result Value Ref Range Status   Specimen Description BLOOD LEFT ANTECUBITAL  Final   Special Requests   Final    BOTTLES DRAWN AEROBIC AND ANAEROBIC Blood Culture adequate volume   Culture   Final    NO GROWTH 2 DAYS Performed at [facility], [address] [place], [place] [address]    Report Status PENDING  Incomplete  Surgical PCR screen     Status: None   Collection Time: 01/07/21  9:15 PM   Specimen: Nasal Mucosa; Nasal Swab  Result Value Ref Range Status   MRSA, PCR NEGATIVE NEGATIVE Final   Staphylococcus aureus NEGATIVE NEGATIVE Final    Comment: (NOTE) The Xpert SA Assay (FDA approved for NASAL specimens in patients 45 years of age and older), is one component of a comprehensive surveillance program. It is not intended to diagnose infection nor to guide or monitor treatment. Performed at [facility], [place], [place]  [address]          Radiology Studies: MR FOOT RIGHT W WO CONTRAST  Result Date: 01/06/2021 CLINICAL DATA:  History of transmetatarsal amputation of the right foot. Now with exposed bone and concern for infection EXAM: MRI OF THE RIGHT FOREFOOT WITHOUT AND WITH CONTRAST TECHNIQUE: Multiplanar, multisequence MR imaging of the right foot was performed before and after the administration of intravenous contrast. CONTRAST:  7.27mL GADAVIST GADOBUTROL 1 MMOL/ML IV SOLN COMPARISON:  X-ray 12/14/2020 FINDINGS: Bones/Joint/Cartilage Postsurgical changes from prior right foot amputation including transmetatarsal amputation of the first, fourth, and fifth metatarsals  and complete second and third ray resections to the level of the TMT joints. Bone marrow edema is present throughout the residual first, fourth, and fifth metatarsals with enhancement. Confluent low T1 signal changes involving the base of the first metatarsal. Bone marrow edema and enhancement involving the medial, intermediate, and lateral cuneiform bones with probable erosions involving the intermediate and lateral cuneiform as well as its patchy areas of decreased T1 bone marrow signal. Bone marrow edema is also present within the cuboid and navicular bones of the midfoot with areas of low T1 marrow signal changes including along the lateral margin of the cuboid and medial margin of the navicular. Marrow signal within the visualized calcaneus, talus, distal tibia, and distal fibula are preserved. No acute fracture or dislocation. Ligaments No evidence of acute ligamentous disruption within the midfoot. Muscles and Tendons Post amputation changes of the flexor and extensor tendons of the foot. No tenosynovitis. Diffuse edema-like signal within the foot musculature, which may represent a combination of denervation changes and myositis. Soft tissues Soft tissue wound or ulceration along the dorsal margin of the stump. Irregular areas of soft tissue hypoenhancement along the distal stump concerning for tissue necrosis. No organized or drainable fluid collection. IMPRESSION: 1. Postsurgical changes from prior right foot amputation. 2. Bone marrow signal changes involving portions of the residual first, fourth, and fifth metatarsals as well as each of the cuneiform bones and also the cuboid and navicular are suggestive of acute osteomyelitis. 3. Soft tissue wound or ulceration along the dorsal margin of the stump. Irregular areas of soft tissue hypoenhancement along the distal stump concerning for tissue necrosis. No organized or drainable fluid collection. Electronically Signed   By: Davina Poke D.O.   On:  01/06/2021 19:01   DG Foot Complete Right  Result Date: 01/08/2021 CLINICAL DATA:  Status post right forefoot amputation EXAM: RIGHT FOOT COMPLETE - 3+ VIEW COMPARISON:  None. FINDINGS: Surgical changes of right forefoot amputation. The tarsal bones remain present, but the metatarsals and phalanges have been removed. A surgical drain is present. Staple line also visualized. No significant subcutaneous gas or evidence of osteomyelitis. IMPRESSION: Surgical changes of forefoot amputation without evidence of complication. Electronically Signed   By: Jacqulynn Cadet M.D.   On: 01/08/2021 12:21        Scheduled Meds:  insulin aspart  0-15 Units Subcutaneous TID WC   mupirocin ointment  1 application Nasal BID   Continuous Infusions:  sodium chloride Stopped (01/08/21 0304)   ceFEPime (MAXIPIME) IV Stopped (01/08/21 0258)   vancomycin 166.7 mL/hr at 01/08/21 0354     LOS: 1 day    Time spent: 37 minutes spent on chart review, discussion with nursing staff, consultants, updating family and interview/physical exam; more than 50% of that time was spent in counseling and/or coordination of care.    Destony Prevost J [place] DO [facility] Available via Epic secure  chat 7am-7pm After these hours, please refer to coverage provider listed on [email or web address] 01/08/2021, 1:55 PM

## 2021-01-08 NOTE — Op Note (Signed)
PATIENT:  Fernando Vega  50 y.o. male   PRE-OPERATIVE DIAGNOSIS:  Right foot Osteomyelitis, ulceration   POST-OPERATIVE DIAGNOSIS:  Right foot Osteomyelitis, ulceration   PROCEDURE:  Procedure(s): DEBRIDEMENT WOUND (Right)PATIENT:  Fernando Vega  50 y.o. male   PRE-OPERATIVE DIAGNOSIS:  Right foot Osteomyelitis, ulceration   POST-OPERATIVE DIAGNOSIS:  Right foot Osteomyelitis, ulceration   PROCEDURE:  Procedure(s): DEBRIDEMENT WOUND (Right) BONE BIOPSY (Right) LISFRANC  AMPUTATION (Right)   SURGEON:  Surgeon(s) and Role:    Trula Slade, DPM - Primary   PHYSICIAN ASSISTANT:    ASSISTANTS: none    ANESTHESIA:   general   EBL:  100 ml    BLOOD ADMINISTERED:none   DRAINS: none    LOCAL MEDICATIONS USED:  OTHER 20 cc lidocaine and marcaine plain   SPECIMEN:  Source of Specimen:  pathology of metatarsals 1, 4, 5; intermediate cuneiform, lateral cuneiform, cuboid each for pathology and micro   DISPOSITION OF SPECIMEN:  PATHOLOGY   COUNTS:  YES   TOURNIQUET:   Total Tourniquet Time Documented: Calf (Right) - 24 minutes Total: Calf (Right) - 24 minutes     DICTATION: .Viviann Spare Dictation   PLAN OF CARE: Admit to inpatient    PATIENT DISPOSITION:  PACU - hemodynamically stable.   Delay start of Pharmacological VTE agent (>24hrs) due to surgical blood loss or risk of bleeding: no BONE BIOPSY (Right) LISFRANC  AMPUTATION (Right)   SURGEON:  Surgeon(s) and Role:    Trula Slade, DPM - Primary   PHYSICIAN ASSISTANT:    ASSISTANTS: none    ANESTHESIA:   general   EBL:  100 ml    BLOOD ADMINISTERED:none   DRAINS: none    LOCAL MEDICATIONS USED:  OTHER 20 cc lidocaine and marcaine plain   SPECIMEN:  Source of Specimen:  pathology of metatarsals 1, 4, 5; intermediate cuneiform, lateral cuneiform, cuboid each for pathology and micro   DISPOSITION OF SPECIMEN:  PATHOLOGY   COUNTS:  YES   TOURNIQUET:   Total Tourniquet Time Documented: Calf  (Right) - 24 minutes Total: Calf (Right) - 24 minutes     DICTATION: .Viviann Spare Dictation   PLAN OF CARE: Admit to inpatient    PATIENT DISPOSITION:  PACU - hemodynamically stable.   Delay start of Pharmacological VTE agent (>24hrs) due to surgical blood loss or risk of bleeding: no  Indications for surgery: Patient is admitted to the hospital for concerns of cellulitis, infection to his right foot.  He presented with a transmetatarsal amputation site nonhealing of the wound.  MRI was performed which did reveal likely osteomyelitis of multiple bones in the foot.  Due to the this and trying to salvage the foot I discussed with him wound debridement, bone biopsy.  Discussed the surgery with him as well as alternatives risks and complications.  No promises or guarantees given.  He wishes to proceed.  Procedure in detail: After an adequate plane of anesthesia was obtained a timeout was performed.  20 cc of lidocaine, Marcaine plain was infiltrated in a regional block fashion.  A well-padded pneumatic calf tourniquet was applied making sure to pad all bony prominences.  Once anesthetized the skin was then prepped in sterile fashion.  Secondary timeout was performed.  Instructed the right foot and sutures removed from the prior surgery.  There is found to be necrotic tissue the distal portion of the amputation site.  No significant healing noted along the amputation site.  Upon evaluation of the soft  tissues from the extensive inflammatory tissue was noted on the plantar flap.  The remaining metatarsals were is identified appear to be soft in nature and dark in color consistent with osteomyelitis.  At this time elected to go and proceed with resection of the remaining metatarsals.  Tourniquet was inflated to 250 mmHg.  I was able to resect the first, fourth, fifth metatarsals.  I sent this for pathology.  Upon evaluation remaining cuneiforms, cuboid there was found to be some discoloration present along the  intermediate, lateral cuneiform.  At this time I utilized a Jamshidi needle in order to take bone biopsies of the intermediate, lateral cuneiform as well as the cuboid.  This was sent for both pathology as well as microbiology.  I debrided all nonviable tissue with a 15 blade scalpel to remove any nonviable or infected tissue.  At this time the tourniquet was released.  I copiously irrigated the wound with saline.  Hemostasis was achieved.  I also at this point revise the skin to remove any nonviable tissue and resected the skin to healthy, bleeding tissue.  Once hemostasis was achieved I then closed the wound with 2-0 nylon in a horizontal mattress fashion followed by skin staples.  JP drain was placed.  Xeroform was applied followed by 4 x 4's, Kerlix, ABD, Ace bandage.  He was woken from anesthesia and found to tolerate the procedure well and complications.  He was then transferred PACU vital signs stable vascular status intact.  Postoperative course: Patient remained inpatient IV antibiotics and await culture, pathology.  Will likely need infectious disease consultation.  Bedrest for today and he will be nonweightbearing.  Encouraged elevation.  We will likely remove remove the drain in about 5 days.

## 2021-01-08 NOTE — Brief Op Note (Signed)
01/08/2021  9:33 AM  PATIENT:  Fernando Vega  50 y.o. male  PRE-OPERATIVE DIAGNOSIS:  Right foot Osteomyelitis, ulceration  POST-OPERATIVE DIAGNOSIS:  Right foot Osteomyelitis, ulceration  PROCEDURE:  Procedure(s): DEBRIDEMENT WOUND (Right) BONE BIOPSY (Right) LISFRANC  AMPUTATION (Right)  SURGEON:  Surgeon(s) and Role:    Trula Slade, DPM - Primary  PHYSICIAN ASSISTANT:   ASSISTANTS: none   ANESTHESIA:   general  EBL:  100 ml   BLOOD ADMINISTERED:none  DRAINS: none   LOCAL MEDICATIONS USED:  OTHER 20 cc lidocaine and marcaine plain  SPECIMEN:  Source of Specimen:  pathology of metatarsals 1, 4, 5; intermediate cuneiform, lateral cuneiform, cuboid each for pathology and micro  DISPOSITION OF SPECIMEN:  PATHOLOGY  COUNTS:  YES  TOURNIQUET:   Total Tourniquet Time Documented: Calf (Right) - 24 minutes Total: Calf (Right) - 24 minutes   DICTATION: .Viviann Spare Dictation  PLAN OF CARE: Admit to inpatient   PATIENT DISPOSITION:  PACU - hemodynamically stable.   Delay start of Pharmacological VTE agent (>24hrs) due to surgical blood loss or risk of bleeding: no  Intraoperative findings: Apparently osteomyelitis of residual metatarsals. Resected residual metatarsal, sent for pathology. Cuneiforms/cuboid biopsied for pathology, micro. Likely residual osteomyelitis of the lateral and intermediate cuneiform. No abscess found. Closed the wound without tension after hemostasis achieved. Drain placed. Will be NWB, bedrest until tomorrow. Will need to stay inpatient and await for culture/pathology to determine further plans.   Patient's wife, Sharee Pimple, updated.

## 2021-01-08 NOTE — Anesthesia Postprocedure Evaluation (Signed)
Anesthesia Post Note  Patient: Fernando Vega  Procedure(s) Performed: DEBRIDEMENT WOUND (Right) BONE BIOPSY (Right) LISFRANC  AMPUTATION (Right: Foot)  Patient location during evaluation: PACU Anesthesia Type: MAC and General Level of consciousness: awake and alert Pain management: pain level controlled Vital Signs Assessment: post-procedure vital signs reviewed and stable Respiratory status: spontaneous breathing, nonlabored ventilation, respiratory function stable and patient connected to nasal cannula oxygen Cardiovascular status: stable and blood pressure returned to baseline Postop Assessment: no apparent nausea or vomiting Anesthetic complications: no   No notable events documented.   Last Vitals:  Vitals:   01/08/21 1015 01/08/21 1037  BP: 130/76 123/78  Pulse: 81 83  Resp: 13   Temp:    SpO2: 96% 96%    Last Pain:  Vitals:   01/08/21 1012  TempSrc:   PainSc: 0-No pain                 Larey Dresser

## 2021-01-08 NOTE — Anesthesia Preprocedure Evaluation (Addendum)
Anesthesia Evaluation  Patient identified by MRN, date of birth, ID band Patient awake    Reviewed: Allergy & Precautions, NPO status , Patient's Chart, lab work & pertinent test results  History of Anesthesia Complications Negative for: history of anesthetic complications  Airway Mallampati: II  TM Distance: >3 FB Neck ROM: Full    Dental  (+) Edentulous Upper, Edentulous Lower, Dental Advidsory Given,    Pulmonary neg shortness of breath, sleep apnea (noncompliant with CPAP) , neg COPD, Recent URI  (Covid+ 12/11/20),    breath sounds clear to auscultation- rhonchi (-) wheezing      Cardiovascular (-) hypertension(-) angina(-) CAD, (-) Past MI, (-) Cardiac Stents and (-) CABG negative cardio ROS   Rhythm:Regular Rate:Normal - Systolic murmurs and - Diastolic murmurs    Neuro/Psych neg Seizures  Neuromuscular disease negative psych ROS   GI/Hepatic negative GI ROS, Neg liver ROS,   Endo/Other  diabetes, Poorly Controlled, Type 2, Insulin Dependent  Renal/GU negative Renal ROS     Musculoskeletal negative musculoskeletal ROS (+)   Abdominal (+) - obese,   Peds  Hematology  (+) Blood dyscrasia, anemia ,   Anesthesia Other Findings 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  Past Medical History: No date: Diabetes mellitus without complication (HCC) No date: Neuropathy No date: Sarcoidosis No date: Sleep apnea   Reproductive/Obstetrics                            Anesthesia Physical  Anesthesia Plan  ASA: 3  Anesthesia Plan: General   Post-op Pain Management:    Induction: Intravenous  PONV Risk Score and Plan: 2 and Propofol infusion, TIVA, Midazolam, Ondansetron and Dexamethasone  Airway Management Planned: Natural Airway  Additional  Equipment: None  Intra-op Plan:   Post-operative Plan:   Informed Consent: I have reviewed the patients History and Physical, chart, labs and discussed the procedure including the risks, benefits and alternatives for the proposed anesthesia with the patient or authorized representative who has indicated his/her understanding and acceptance.     Dental advisory given  Plan Discussed with: CRNA and Surgeon  Anesthesia Plan Comments: (Discussed risks of anesthesia with patient, including possibility of difficulty with spontaneous ventilation under anesthesia necessitating airway intervention, PONV, and rare risks such as cardiac or respiratory or neurological events, and allergic reactions. Patient understands.)        Anesthesia Quick Evaluation

## 2021-01-08 NOTE — Transfer of Care (Addendum)
Immediate Anesthesia Transfer of Care Note  Patient: Fernando Vega  Procedure(s) Performed: DEBRIDEMENT WOUND (Right) BONE BIOPSY (Right) LISFRANC  AMPUTATION (Right: Foot)  Patient Location: PACU  Anesthesia Type:MAC  Level of Consciousness: awake, alert  and oriented  Airway & Oxygen Therapy: Patient Spontanous Breathing  Post-op Assessment: Report given to RN, Post -op Vital signs reviewed and stable and Patient moving all extremities X 4  Post vital signs: stable     Last Vitals:  Vitals Value Taken Time  BP 119/76 01/08/21 0942  Temp 36.3 C 01/08/21 0942  Pulse 81 01/08/21 0944  Resp 15 01/08/21 0944  SpO2 97 % 01/08/21 0944  Vitals shown include unvalidated device data.  Last Pain:  Vitals:   01/08/21 0942  TempSrc: Skin  PainSc: 0-No pain         Complications: No notable events documented.

## 2021-01-08 NOTE — Progress Notes (Signed)
Patient seen in pre-op. He is scheduled for surgery today for right foot debridement, bone biopsy. I have reviewed the MRI with him and we have again discussed the surgery, post-op course. We discussed risks of surgery including delayed or nonhealing, spread of infection, proximal amputation, scarring, bleeding as well as general risks of surgery including stroke, heart attack, death.  After discussion he agrees to proceed with surgical plan as scheduled today.  Discussed with him that he is likely can have a wound deficit will likely need to return to the operating room for possible graft and wound VAC application.  He understands that he is at risk of limb loss.  Will update wife postop  Dr. Amalia Hailey saw the patient last night and consult was done.   Celesta Gentile, DPM

## 2021-01-09 ENCOUNTER — Encounter: Payer: Self-pay | Admitting: Podiatry

## 2021-01-09 LAB — GLUCOSE, CAPILLARY
Glucose-Capillary: 186 mg/dL — ABNORMAL HIGH (ref 70–99)
Glucose-Capillary: 217 mg/dL — ABNORMAL HIGH (ref 70–99)
Glucose-Capillary: 107 mg/dL — ABNORMAL HIGH (ref 70–99)
Glucose-Capillary: 136 mg/dL — ABNORMAL HIGH (ref 70–99)

## 2021-01-09 LAB — CBC
HCT: 29.4 % — ABNORMAL LOW (ref 39.0–52.0)
Hemoglobin: 9 g/dL — ABNORMAL LOW (ref 13.0–17.0)
MCH: 25.9 pg — ABNORMAL LOW (ref 26.0–34.0)
MCHC: 30.6 g/dL (ref 30.0–36.0)
MCV: 84.7 fL (ref 80.0–100.0)
Platelets: 356 10*3/uL (ref 150–400)
RBC: 3.47 MIL/uL — ABNORMAL LOW (ref 4.22–5.81)
RDW: 14.5 % (ref 11.5–15.5)
WBC: 10.6 10*3/uL — ABNORMAL HIGH (ref 4.0–10.5)
nRBC: 0 % (ref 0.0–0.2)

## 2021-01-09 LAB — BASIC METABOLIC PANEL
Anion gap: 6 (ref 5–15)
BUN: 26 mg/dL — ABNORMAL HIGH (ref 6–20)
CO2: 27 mmol/L (ref 22–32)
Calcium: 8.3 mg/dL — ABNORMAL LOW (ref 8.9–10.3)
Chloride: 103 mmol/L (ref 98–111)
Creatinine, Ser: 0.91 mg/dL (ref 0.61–1.24)
GFR, Estimated: >60 mL/min (ref >60)
Glucose, Bld: 213 mg/dL — ABNORMAL HIGH (ref 70–99)
Potassium: 4.3 mmol/L (ref 3.5–5.1)
Sodium: 136 mmol/L (ref 135–145)

## 2021-01-09 MED ORDER — INSULIN ASPART 100 UNIT/ML IJ SOLN
0.0000 [IU] | Freq: Every day | INTRAMUSCULAR | Status: DC
  Administered 2021-01-10 – 2021-01-13 (×2): 2 [IU] via SUBCUTANEOUS
  Filled 2021-01-09 (×2): qty 1

## 2021-01-09 MED ORDER — INSULIN GLARGINE-YFGN 100 UNIT/ML ~~LOC~~ SOLN
10.0000 [IU] | Freq: Every day | SUBCUTANEOUS | Status: DC
  Administered 2021-01-09 – 2021-01-10 (×2): 10 [IU] via SUBCUTANEOUS
  Filled 2021-01-09 (×2): qty 0.1

## 2021-01-09 MED ORDER — INSULIN ASPART 100 UNIT/ML IJ SOLN
0.0000 [IU] | Freq: Three times a day (TID) | INTRAMUSCULAR | Status: DC
  Administered 2021-01-09 – 2021-01-10 (×2): 5 [IU] via SUBCUTANEOUS
  Administered 2021-01-10: 2 [IU] via SUBCUTANEOUS
  Administered 2021-01-11: 5 [IU] via SUBCUTANEOUS
  Administered 2021-01-11 – 2021-01-13 (×7): 3 [IU] via SUBCUTANEOUS
  Administered 2021-01-13: 2 [IU] via SUBCUTANEOUS
  Administered 2021-01-14 (×3): 3 [IU] via SUBCUTANEOUS
  Administered 2021-01-15: 2 [IU] via SUBCUTANEOUS
  Administered 2021-01-15: 3 [IU] via SUBCUTANEOUS
  Filled 2021-01-09 (×17): qty 1

## 2021-01-09 NOTE — Progress Notes (Signed)
Pharmacy Antibiotic Note  Fernando Vega is a 50 y.o. male admitted on 01/06/2021 with  osteomyelitis .  Pharmacy has been consulted for Vancomycin and Cefepime dosing.  Antibiotics Day 3  Plan: 1) Continue Cefepime 2 gm IV Q8H    2) Continue Vancomycin 1250 mg Q12 hours  Expected AUC = 518.9  Expected Vanc trough = 14.7 mg/mL  SCR: 0.91, Vd: 0.72  Height: 5\' 7"  (170.2 cm) Weight: 85.7 kg (189 lb) IBW/kg (Calculated) : 66.1  Temp (24hrs), Avg:97.9 F (36.6 C), Min:97 F (36.1 C), Max:98.5 F (36.9 C)  Recent Labs  Lab 01/06/21 1735 01/07/21 0701 01/09/21 0424  WBC 8.0  --  10.6*  CREATININE 0.81  --  0.91  LATICACIDVEN 1.2 0.8  --      Estimated Creatinine Clearance: 99.3 mL/min (by C-G formula based on SCr of 0.91 mg/dL).    No Known Allergies  Antimicrobials this admission:  Vancomycin 10/28 >>  Cefepime 10/28 >>   Dose adjustments this admission:   Microbiology results: 10/27 BCx: NGTD 10/29 Wound cx: pending 10/27 MRSA PCR: negative  Thank you for allowing pharmacy to be a part of this patient's care.  Capers Hagmann A Sabrine Patchen 01/09/2021 7:45 AM

## 2021-01-09 NOTE — Progress Notes (Signed)
Pt refused cpap. States he has not been using at home

## 2021-01-09 NOTE — Progress Notes (Signed)
PROGRESS NOTE    Fernando Vega  EUM:[id number] DOB: 1970/06/14 DOA: 01/06/2021 PCP: Langston Reusing, NP    Brief Narrative:  Fernando Vega is a 50 year old male with past medical history significant for DM2, essential hypertension, OSA on CPAP, with history of diabetic foot wound/osteomyelitis with recent transmetatarsal amputation on 10/3 by podiatry who presents to [facility] ED by discretion of his podiatrist for wound dehiscence, cellulitis and bone exposure.  Patient reported moderate intensity pain and throbbing at his stump site.  Denies fever/chills.  No other complaints at the time.  In the ED, temperature 98.3 F, HR 89, RR 16, BP 136/77, SPO2 98% on room air.  Sodium 137, potassium 3.7, chloride 100, CO2 27, BUN 19, creatinine 0.1, glucose 281.  Lactic acid 1.2, WBC count 8.0, hemoglobin 11.0, platelets 444.  Cova-19 PCR negative.  Influenza A/B PCR negative.  MR right foot with and without contrast with findings suggestive of acute osteomyelitis to residual first/fourth/fifth metatarsals, soft tissue wound ulceration concerning for tissue necrosis, no abscess/fluid collection identified.  EDP consulted hospitalist service for further evaluation and treatment of acute osteomyelitis right foot/stump.   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  MR right foot with and without contrast with findings suggestive of acute osteomyelitis to residual first/fourth/fifth metatarsals associated with tissue necrosis and no abscess.  Patient underwent debridement, Lisfranc amputation and bone biopsy by  podiatry Dr. Jacqualyn Posey on 01/08/2021. --Operative culture, biopsy/pathology results: Pending --Vancomycin, pharmacy consulted for dosing/monitoring --Cefepime 2 g IV q8h --Norco 5-325 mg 1-2 tablets q4h PRN moderate pain --Morphine 2 mg IV q2h PRN severe pain --Nonweightbearing right lower extremity --Continue monitor drain output --Will likely need prolonged IV antibiotics per podiatry, will consult ID on 10/23  Type 2 diabetes mellitus, with hyperglycemia Hemoglobin A1c 8.0 on 12/11/2020, not optimally controlled. (But improved since 11/01/2020 with A1c 11.8).  Home regimen includes insulin glargine 15 units subcutaneously daily, Humalog sliding scale. --Semglee 10u [place] daily --Moderate SSI for coverage --CBGs before every meal/at bedtime  OSA: Continue nocturnal CPAP  Essential hypertension Currently not on any antihypertensives at home. --Continue monitor BP closely --Hydralazine 25 mg PO q6h prn SBP >170 or DBP >110   DVT prophylaxis: SCD's Start: 01/09/21 0101 SCDs Start: 01/07/21 0026   Code Status: Full Code Family Communication: No family present at bedside this morning  Disposition Plan:  Level of care: Med-Surg Status is: Inpatient  Remains inpatient appropriate because: Acute osteomyelitis IV antibiotics    Consultants:  Podiatry, Dr. Jacqualyn Posey  Procedures:  Debridement, Lisfranc amputation, biopsy with drain placement, podiatry, Dr. Jacqualyn Posey 10/29  Antimicrobials:  Vancomycin 10/27>> Cefepime 10/27>> Cipro 10/27 - 10/27   Subjective: Patient seen examined at bedside, resting comfortably.  Pain fairly well controlled.  Remains on IV antibiotics per podiatry recommendations.  Awaiting operative culture results.  Patient concerned about affordability of outpatient IV antibiotics.  Discussed with patient we will have social work discussed with him.  No other complaints or concerns at this time.  Denies headache, no fever/chills/night sweats, no  nausea/vomiting/diarrhea, no chest pain, palpitations, no shortness of breath,  no abdominal pain, no weakness, no fatigue, no paresthesias.  No acute events overnight per nursing.  Objective: Vitals:   01/08/21 1733 01/08/21 2027 01/09/21 0454 01/09/21 0733  BP: 110/69 116/72 115/70 131/73  Pulse: 89 91 87 92  Resp: 20 18 14 16   Temp: 98.2 F (36.8 C) 98.1 F (36.7 C) 98.5 F (36.9 C) 98.1 F (36.7 C)  TempSrc: Oral  Axillary   SpO2: 99% 95% 97% 96%  Weight:      Height:        Intake/Output Summary (Last 24 hours) at 01/09/2021 1153 Last data filed at 01/09/2021 1028 Gross per 24 hour  Intake 1261.99 ml  Output 550 ml  Net 711.99 ml   Filed Weights   01/06/21 1704  Weight: 85.7 kg    Examination:  General exam: Appears calm and comfortable  Respiratory system: Clear to auscultation. Respiratory effort normal.  On room air Cardiovascular system: S1 & S2 heard, RRR. No JVD, murmurs, rubs, gallops or clicks.  1+ pitting edema bilateral lower extremities. Gastrointestinal system: Abdomen is nondistended, soft and nontender. No organomegaly or masses felt. Normal bowel sounds heard. Central nervous system: Alert and oriented. No focal neurological deficits. Extremities: Moves all extremities independently, strength preserved, noted right foot with dressing/Ace wrap in place, JP drain noted, dressing clean/dry/intact Skin: No rashes, lesions or ulcers Psychiatry: Judgement and insight appear normal. Mood & affect appropriate.       Data Reviewed: I have personally reviewed following labs and imaging studies  CBC: Recent Labs  Lab 01/06/21 1735 01/09/21 0424  WBC 8.0 10.6*  NEUTROABS 4.2  --   HGB 11.0* 9.0*  HCT 34.4* 29.4*  MCV 84.3 84.7  PLT 444* 852   Basic Metabolic Panel: Recent Labs  Lab 01/06/21 1735 01/09/21 0424  NA 137 136  K 3.7 4.3  CL 100 103  CO2 27 27  GLUCOSE 281* 213*  BUN 19 26*  CREATININE 0.81 0.91  CALCIUM 8.9 8.3*    GFR: Estimated Creatinine Clearance: 99.3 mL/min (by C-G formula based on SCr of 0.91 mg/dL). Liver Function Tests: No results for input(s): AST, ALT, ALKPHOS, BILITOT, PROT, ALBUMIN in the last 168 hours. No results for input(s): LIPASE, AMYLASE in the last 168 hours. No results for input(s): AMMONIA in the last 168 hours. Coagulation Profile: No results for input(s): INR, PROTIME in the last 168 hours. Cardiac Enzymes: No results for input(s): CKTOTAL, CKMB, CKMBINDEX, TROPONINI in the last 168 hours. BNP (last 3 results) No results for input(s): PROBNP in the last 8760 hours. HbA1C: No results for input(s): HGBA1C in the last 72 hours. CBG: Recent Labs  Lab 01/08/21 1151 01/08/21 1706 01/08/21 2228 01/09/21 0731 01/09/21 1136  GLUCAP 215* 217* 258* 186* 217*   Lipid Profile: No results for input(s): CHOL, HDL, LDLCALC, TRIG, CHOLHDL, LDLDIRECT in the last 72 hours. Thyroid Function Tests: No results for input(s): TSH, T4TOTAL, FREET4, T3FREE, THYROIDAB in the last 72 hours. Anemia Panel: No results for input(s): VITAMINB12, FOLATE, FERRITIN, TIBC, IRON, RETICCTPCT in the last 72 hours. Sepsis Labs: Recent Labs  Lab 01/06/21 1735 01/07/21 0701  LATICACIDVEN 1.2 0.8    Recent Results (from the past 240 hour(s))  Culture, blood (routine x 2)     Status: None (Preliminary result)   Collection Time: 01/06/21  5:35 PM   Specimen: BLOOD  Result Value Ref Range Status   Specimen Description BLOOD LEFT ANTECUBITAL  Final   Special Requests   Final  BOTTLES DRAWN AEROBIC AND ANAEROBIC Blood Culture adequate volume   Culture   Final    NO GROWTH 3 DAYS Performed at [facility], [address] [place], [place] [address]    Report Status PENDING  Incomplete  Resp Panel by RT-PCR (Flu A&B, Covid) Nasopharyngeal Swab     Status: None   Collection Time: 01/06/21  5:35 PM   Specimen: Nasopharyngeal Swab; Nasopharyngeal(NP) swabs in vial transport medium   Result Value Ref Range Status   SARS Coronavirus 2 by RT PCR NEGATIVE NEGATIVE Final    Comment: (NOTE) SARS-CoV-2 target nucleic acids are NOT DETECTED.  The SARS-CoV-2 RNA is generally detectable in upper respiratory specimens during the acute phase of infection. The lowest concentration of SARS-CoV-2 viral copies this assay can detect is 138 copies/mL. A negative result does not preclude SARS-Cov-2 infection and should not be used as the sole basis for treatment or other patient management decisions. A negative result may occur with  improper specimen collection/handling, submission of specimen other than nasopharyngeal swab, presence of viral mutation(s) within the areas targeted by this assay, and inadequate number of viral copies(<138 copies/mL). A negative result must be combined with clinical observations, patient history, and epidemiological information. The expected result is Negative.  Fact Sheet for Patients:  EntrepreneurPulse.com.au  Fact Sheet for Healthcare Providers:  IncredibleEmployment.be  This test is no t yet approved or cleared by the [place] FDA and  has been authorized for detection and/or diagnosis of SARS-CoV-2 by FDA under an Emergency Use Authorization (EUA). This EUA will remain  in effect (meaning this test can be used) for the duration of the COVID-19 declaration under Section 564(b)(1) of the Act, 21 U.S.C.section 360bbb-3(b)(1), unless the authorization is terminated  or revoked sooner.       Influenza A by PCR NEGATIVE NEGATIVE Final   Influenza B by PCR NEGATIVE NEGATIVE Final    Comment: (NOTE) The Xpert Xpress SARS-CoV-2/FLU/RSV plus assay is intended as an aid in the diagnosis of influenza from Nasopharyngeal swab specimens and should not be used as a sole basis for treatment. Nasal washings and aspirates are unacceptable for Xpert Xpress SARS-CoV-2/FLU/RSV testing.  Fact Sheet for  Patients: EntrepreneurPulse.com.au  Fact Sheet for Healthcare Providers: IncredibleEmployment.be  This test is not yet approved or cleared by the [place] FDA and has been authorized for detection and/or diagnosis of SARS-CoV-2 by FDA under an Emergency Use Authorization (EUA). This EUA will remain in effect (meaning this test can be used) for the duration of the COVID-19 declaration under Section 564(b)(1) of the Act, 21 U.S.C. section 360bbb-3(b)(1), unless the authorization is terminated or revoked.  Performed at [facility], [address] [place], [place] [address]   Culture, blood (routine x 2)     Status: None (Preliminary result)   Collection Time: 01/06/21  5:41 PM   Specimen: BLOOD  Result Value Ref Range Status   Specimen Description BLOOD LEFT ANTECUBITAL  Final   Special Requests   Final    BOTTLES DRAWN AEROBIC AND ANAEROBIC Blood Culture adequate volume   Culture   Final    NO GROWTH 3 DAYS Performed at [facility], [address], [place], [place] [address]    Report Status PENDING  Incomplete  Surgical PCR screen     Status: None   Collection Time: 01/07/21  9:15 PM   Specimen: Nasal Mucosa; Nasal Swab  Result Value Ref Range Status   MRSA, PCR NEGATIVE NEGATIVE Final  Staphylococcus aureus NEGATIVE NEGATIVE Final    Comment: (NOTE) The Xpert SA Assay (FDA approved for NASAL specimens in patients 5 years of age and older), is one component of a comprehensive surveillance program. It is not intended to diagnose infection nor to guide or monitor treatment. Performed at [facility], [address], [place], [place] [address]          Radiology Studies: DG Foot Complete Right  Result Date: 01/08/2021 CLINICAL DATA:  Status post right forefoot amputation EXAM: RIGHT FOOT COMPLETE - 3+ VIEW COMPARISON:  None. FINDINGS: Surgical changes of right forefoot amputation. The tarsal  bones remain present, but the metatarsals and phalanges have been removed. A surgical drain is present. Staple line also visualized. No significant subcutaneous gas or evidence of osteomyelitis. IMPRESSION: Surgical changes of forefoot amputation without evidence of complication. Electronically Signed   By: Jacqulynn Cadet M.D.   On: 01/08/2021 12:21        Scheduled Meds:  insulin aspart  0-15 Units Subcutaneous TID WC   insulin aspart  0-5 Units Subcutaneous QHS   insulin glargine-yfgn  10 Units Subcutaneous Daily   Continuous Infusions:  sodium chloride Stopped (01/08/21 0304)   ceFEPime (MAXIPIME) IV Stopped (01/09/21 0835)   vancomycin Stopped (01/09/21 0347)     LOS: 2 days    Time spent: 37 minutes spent on chart review, discussion with nursing staff, consultants, updating family and interview/physical exam; more than 50% of that time was spent in counseling and/or coordination of care.    Jeet Shough J [place] DO [facility] Available via Epic secure chat 7am-7pm After these hours, please refer to coverage provider listed on [email or web address] 01/09/2021, 11:53 AM

## 2021-01-09 NOTE — Progress Notes (Signed)
Subjective: POD #1 s/p right foot Lisfranc disarticulation, bone biopsy, debridement.  He has been experiencing some discomfort.  Currently denies any fevers or chills.  He has no new concerns today.  Objective: AAO x3, NAD DP/PT pulses palpable bilaterally, CRT less than 3 seconds Incision well coapted with sutures, staples intact.  There is no active bleeding noted.  Drain intact with about 10 cc of bloody drainage.  There is no purulence.  Mild edema present in the foot.  There is no purulence. No pain with calf compression, swelling, warmth, erythema       Assessment: POD # 1 s/p right foot Lisfranc disarticulation, bone biopsy, wound debridement  Plan: This point I recommend remain inpatient await culture results.  Likely infectious disease consultation is concern for residual osteomyelitis of the cuneiforms.  Encouraged elevation, nonweightbearing.  Continue IV antibiotics for now. Podiatry will continue to follow.   Celesta Gentile, DPM

## 2021-01-09 NOTE — TOC Initial Note (Signed)
Transition of Care [facility]) - Initial/Assessment Note    Patient Details  Name: Fernando Vega MRN: [id number] Date of Birth: November 18, 1970  [facility]) CM/SW Contact:    Conception Oms, RN Phone Number: 01/09/2021, 10:59 AM  Clinical Narrative:      Met with the patient and discussed needs and DC plan He lives at home with his wife, he was managing his dressing changes at home previously           He has a wheelchair, a rolling walker and a knee scooter at home, He has no insurance and will not be able to go to [facility]  He stated he really does not want to go home with IV ABX, he feels that he [place] way too far out of town to get any [facility] agency to go see him, Cultures are still pending TOC to continue to monitor for needs and assist with DC planning         Patient Goals and CMS Choice        Expected Discharge Plan and Services                                                Prior Living Arrangements/Services                       Activities of Daily Living Home Assistive Devices/Equipment: CBG Meter, Eyeglasses ADL Screening (condition at time of admission) Patient's cognitive ability adequate to safely complete daily activities?: Yes Is the patient deaf or have difficulty hearing?: No Does the patient have difficulty seeing, even when wearing glasses/contacts?: No Does the patient have difficulty concentrating, remembering, or making decisions?: No Patient able to express need for assistance with ADLs?: Yes Does the patient have difficulty dressing or bathing?: No Independently performs ADLs?: Yes (appropriate for developmental age) Does the patient have difficulty walking or climbing stairs?: No Weakness of Legs: None Weakness of Arms/Hands: None  Permission Sought/Granted                  Emotional Assessment              Admission diagnosis:  Acute osteomyelitis of right foot ([place]) [M86.171] Osteomyelitis of right  foot, unspecified type [facility]) [M86.9] Patient Active Problem List   Diagnosis Date Noted   S/P transmetatarsal amputation of foot, right 12/12/2020(HCC) 01/07/2021   Infection of right TMA stump ([place]) 01/07/2021   Acute osteomyelitis of right foot ([place]) 01/07/2021   Essential hypertension 12/30/2020   Sleep apnea 12/11/2020   Anemia 12/11/2020   Osteomyelitis ([place]) 12/11/2020   Encounter to establish care 12/02/2020   Leukocytosis    Puncture wound 11/01/2020   Type 2 diabetes mellitus with peripheral neuropathy ([place]) 11/01/2020   Necrotizing soft tissue infection 11/01/2020   Diabetic foot infection ([place])    PCP:  Langston Reusing, NP Pharmacy:   [facility] [address] [place] [address] Phone: [phone] Fax: [phone]     Social Determinants of Health (SDOH) Interventions    Readmission Risk Interventions No flowsheet data found.

## 2021-01-10 LAB — CBC
HCT: 28.3 % — ABNORMAL LOW (ref 39.0–52.0)
Hemoglobin: 8.6 g/dL — ABNORMAL LOW (ref 13.0–17.0)
MCH: 25.5 pg — ABNORMAL LOW (ref 26.0–34.0)
MCHC: 30.4 g/dL (ref 30.0–36.0)
MCV: 84 fL (ref 80.0–100.0)
Platelets: 329 10*3/uL (ref 150–400)
RBC: 3.37 MIL/uL — ABNORMAL LOW (ref 4.22–5.81)
RDW: 14.4 % (ref 11.5–15.5)
WBC: 9.2 10*3/uL (ref 4.0–10.5)
nRBC: 0 % (ref 0.0–0.2)

## 2021-01-10 LAB — BASIC METABOLIC PANEL
Anion gap: 6 (ref 5–15)
BUN: 16 mg/dL (ref 6–20)
CO2: 28 mmol/L (ref 22–32)
Calcium: 8.3 mg/dL — ABNORMAL LOW (ref 8.9–10.3)
Chloride: 101 mmol/L (ref 98–111)
Creatinine, Ser: 0.65 mg/dL (ref 0.61–1.24)
GFR, Estimated: >60 mL/min (ref >60)
Glucose, Bld: 157 mg/dL — ABNORMAL HIGH (ref 70–99)
Potassium: 3.9 mmol/L (ref 3.5–5.1)
Sodium: 135 mmol/L (ref 135–145)

## 2021-01-10 LAB — GLUCOSE, CAPILLARY
Glucose-Capillary: 221 mg/dL — ABNORMAL HIGH (ref 70–99)
Glucose-Capillary: 142 mg/dL — ABNORMAL HIGH (ref 70–99)
Glucose-Capillary: 130 mg/dL — ABNORMAL HIGH (ref 70–99)
Glucose-Capillary: 215 mg/dL — ABNORMAL HIGH (ref 70–99)

## 2021-01-10 MED ORDER — INSULIN GLARGINE-YFGN 100 UNIT/ML ~~LOC~~ SOLN
14.0000 [IU] | Freq: Every day | SUBCUTANEOUS | Status: DC
  Administered 2021-01-11 – 2021-01-15 (×5): 14 [IU] via SUBCUTANEOUS
  Filled 2021-01-10 (×6): qty 0.14

## 2021-01-10 MED ORDER — SENNOSIDES-DOCUSATE SODIUM 8.6-50 MG PO TABS
1.0000 | ORAL_TABLET | Freq: Two times a day (BID) | ORAL | Status: DC
  Administered 2021-01-12 – 2021-01-15 (×6): 1 via ORAL
  Filled 2021-01-10 (×9): qty 1

## 2021-01-10 NOTE — Consult Note (Signed)
   PODIATRY CONSULTATION  NAME Fernando Vega MRN [id number] DOB [birth year] DOA 01/06/2021   Reason for consult: Osteomyelitis RT foot Chief Complaint  Patient presents with   Foot Pain   Wound Infection    Consulting physician: Judd Gaudier  History of present illness: 50 y.o. male Patient PMHx DM type II s/p RT TMA 12/13/2020 well-known to the practice for recurrent infection and prior amputations presenting to the ED today after being seen in the office on 01/06/2021.  Concern for worsening infection with dehiscence of the transmetatarsal amputation site.  Recommend that the patient comes to the emergency department for IV antibiotics and MRI to assess for additional osteomyelitis.  Understands that he may need return to the OR.  Patient seen today in the ED.  Past Medical History:  Diagnosis Date   Diabetes mellitus without complication ([place])    Neuropathy    Sarcoidosis    Sleep apnea     CBC Latest Ref Rng & Units 01/10/2021 01/09/2021 01/06/2021  WBC 4.0 - 10.5 K/uL 9.2 10.6(H) 8.0  Hemoglobin 13.0 - 17.0 g/dL 8.6(L) 9.0(L) 11.0(L)  Hematocrit 39.0 - 52.0 % 28.3(L) 29.4(L) 34.4(L)  Platelets 150 - 400 K/uL 329 356 444(H)    BMP Latest Ref Rng & Units 01/10/2021 01/09/2021 01/06/2021  Glucose 70 - 99 mg/dL 157(H) 213(H) 281(H)  BUN 6 - 20 mg/dL 16 26(H) 19  Creatinine 0.61 - 1.24 mg/dL 0.65 0.91 0.81  Sodium 135 - 145 mmol/L 135 136 137  Potassium 3.5 - 5.1 mmol/L 3.9 4.3 3.7  Chloride 98 - 111 mmol/L 101 103 100  CO2 22 - 32 mmol/L 28 27 27   Calcium 8.9 - 10.3 mg/dL 8.3(L) 8.3(L) 8.9       Physical Exam: Dressings left intact.  Based on pictures taken in the ED earlier today it is a significant amount of necrosis and eschar around the amputation stump.  Please see attached photo.  Lower extremity edema also noted   ASSESSMENT/PLAN OF CARE S/p TMA RT 10/3 with dehiscence/concern for OM -Patient planned to return to the OR tomorrow AM.  Orders will be placed this  evening.  -N.p.o. after midnight -The plan of care was discussed and explained with the patient.  He is understanding and expecting surgery for tomorrow a.m. with Dr. Jacqualyn Posey within our practice.  Surgery will likely consist of revisional TMA with bone biopsy. -Podiatry will follow     Thank you for the consult.  Please contact me directly with any questions or concerns.  Cell [phone]   Edrick Kins, DPM [facility]  Dr. Edrick Kins, DPM    [address], [place] [address]                Office [phone]  Fax [phone]

## 2021-01-10 NOTE — Progress Notes (Signed)
PROGRESS NOTE    Fernando Vega  HLK:[id number] DOB: [birth year] DOA: 01/06/2021 PCP: Langston Reusing, NP    Brief Narrative:  Fernando Vega is a 50 year old male with past medical history significant for DM2, essential hypertension, OSA on CPAP, with history of diabetic foot wound/osteomyelitis with recent transmetatarsal amputation on 10/3 by podiatry who presents to [facility] ED by discretion of his podiatrist for wound dehiscence, cellulitis and bone exposure.  Patient reported moderate intensity pain and throbbing at his stump site.  Denies fever/chills.  No other complaints at the time.  In the ED, temperature 98.3 F, HR 89, RR 16, BP 136/77, SPO2 98% on room air.  Sodium 137, potassium 3.7, chloride 100, CO2 27, BUN 19, creatinine 0.1, glucose 281.  Lactic acid 1.2, WBC count 8.0, hemoglobin 11.0, platelets 444.  Cova-19 PCR negative.  Influenza A/B PCR negative.  MR right foot with and without contrast with findings suggestive of acute osteomyelitis to residual first/fourth/fifth metatarsals, soft tissue wound ulceration concerning for tissue necrosis, no abscess/fluid collection identified.  EDP consulted hospitalist service for further evaluation and treatment of acute osteomyelitis right foot/stump.   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  MR right foot with and without contrast with findings suggestive of acute osteomyelitis to residual first/fourth/fifth metatarsals associated with tissue necrosis and no abscess.  Patient underwent debridement, Lisfranc amputation and bone biopsy by  podiatry Dr. Jacqualyn Posey on 01/08/2021. --Blood cultures x2: No growth x2 days --Operative culture, biopsy/pathology results: Pending --Vancomycin, pharmacy consulted for dosing/monitoring --Cefepime 2 g IV q8h --Norco 5-325 mg 1-2 tablets q4h PRN moderate pain --Morphine 2 mg IV q2h PRN severe pain --Nonweightbearing right lower extremity --Continue monitor drain output --Will likely need prolonged IV antibiotics per podiatry, will await operative cultures initial results prior to engaging infectious disease  Type 2 diabetes mellitus, with hyperglycemia Hemoglobin A1c 8.0 on 12/11/2020, not optimally controlled. (But improved since 11/01/2020 with A1c 11.8).  Home regimen includes insulin glargine 15 units subcutaneously daily, Humalog sliding scale. --Semglee 14u [place] daily --Moderate SSI for coverage --CBGs before every meal/at bedtime  OSA: Continue nocturnal CPAP  Essential hypertension Currently not on any antihypertensives at home. --Continue monitor BP closely --Hydralazine 25 mg PO q6h prn SBP >170 or DBP >110   DVT prophylaxis: SCD's Start: 01/09/21 0101   Code Status: Full Code Family Communication: No family present at bedside this morning  Disposition Plan:  Level of care: Med-Surg Status is: Inpatient  Remains inpatient appropriate because: Acute osteomyelitis IV antibiotics    Consultants:  Podiatry, Dr. Jacqualyn Posey  Procedures:  Debridement, Lisfranc amputation, biopsy with drain placement, podiatry, Dr. Jacqualyn Posey 10/29  Antimicrobials:  Vancomycin 10/27>> Cefepime 10/27>> Cipro 10/27 - 10/27   Subjective: Patient seen examined at bedside, resting comfortably.  Pain controlled.  No complaints this morning.  Remains on IV antibiotics per podiatry recommendations.  Awaiting operative culture results.  No other complaints or concerns at this time.  Denies headache, no fever/chills/night sweats, no nausea/vomiting/diarrhea, no chest pain, palpitations, no shortness of  breath, no abdominal pain, no weakness, no fatigue, no paresthesias.  No acute events overnight per nursing.  Objective: Vitals:   01/09/21 1602 01/09/21 2021 01/10/21 0330 01/10/21 0803  BP: 121/81 128/76 122/73 127/72  Pulse: 91 94 85 78  Resp: 16 16 20 20   Temp: 98.7 F (37.1 C) 98.5 F (36.9 C) 98.5 F (36.9 C) 97.9 F (36.6 C)  TempSrc:   Oral   SpO2: 97% 95% 96% 96%  Weight:      Height:        Intake/Output Summary (Last 24 hours) at 01/10/2021 1242 Last data filed at 01/10/2021 1023 Gross per 24 hour  Intake 1874.04 ml  Output 1910 ml  Net -35.96 ml   Filed Weights   01/06/21 1704  Weight: 85.7 kg    Examination:  General exam: Appears calm and comfortable  Respiratory system: Clear to auscultation. Respiratory effort normal.  On room air Cardiovascular system: S1 & S2 heard, RRR. No JVD, murmurs, rubs, gallops or clicks.  1+ pitting edema bilateral lower extremities. Gastrointestinal system: Abdomen is nondistended, soft and nontender. No organomegaly or masses felt. Normal bowel sounds heard. Central nervous system: Alert and oriented. No focal neurological deficits. Extremities: Moves all extremities independently, strength preserved, noted right foot with dressing/Ace wrap in place, JP drain noted, dressing clean/dry/intact Skin: No rashes, lesions or ulcers Psychiatry: Judgement and insight appear normal. Mood & affect appropriate.       Data Reviewed: I have personally reviewed following labs and imaging studies  CBC: Recent Labs  Lab 01/06/21 1735 01/09/21 0424 01/10/21 0404  WBC 8.0 10.6* 9.2  NEUTROABS 4.2  --   --   HGB 11.0* 9.0* 8.6*  HCT 34.4* 29.4* 28.3*  MCV 84.3 84.7 84.0  PLT 444* 356 947   Basic Metabolic Panel: Recent Labs  Lab 01/06/21 1735 01/09/21 0424 01/10/21 0404  NA 137 136 135  K 3.7 4.3 3.9  CL 100 103 101  CO2 27 27 28   GLUCOSE 281* 213* 157*  BUN 19 26* 16  CREATININE 0.81 0.91 0.65  CALCIUM 8.9 8.3* 8.3*    GFR: Estimated Creatinine Clearance: 112.9 mL/min (by C-G formula based on SCr of 0.65 mg/dL). Liver Function Tests: No results for input(s): AST, ALT, ALKPHOS, BILITOT, PROT, ALBUMIN in the last 168 hours. No results for input(s): LIPASE, AMYLASE in the last 168 hours. No results for input(s): AMMONIA in the last 168 hours. Coagulation Profile: No results for input(s): INR, PROTIME in the last 168 hours. Cardiac Enzymes: No results for input(s): CKTOTAL, CKMB, CKMBINDEX, TROPONINI in the last 168 hours. BNP (last 3 results) No results for input(s): PROBNP in the last 8760 hours. HbA1C: No results for input(s): HGBA1C in the last 72 hours. CBG: Recent Labs  Lab 01/09/21 1136 01/09/21 1632 01/09/21 2101 01/10/21 0735 01/10/21 1146  GLUCAP 217* 107* 136* 221* 142*   Lipid Profile: No results for input(s): CHOL, HDL, LDLCALC, TRIG, CHOLHDL, LDLDIRECT in the last 72 hours. Thyroid Function Tests: No results for input(s): TSH, T4TOTAL, FREET4, T3FREE, THYROIDAB in the last 72 hours. Anemia Panel: No results for input(s): VITAMINB12, FOLATE, FERRITIN, TIBC, IRON, RETICCTPCT in the last 72 hours. Sepsis Labs: Recent Labs  Lab 01/06/21 1735 01/07/21 0701  LATICACIDVEN 1.2 0.8    Recent Results (from the past 240 hour(s))  Culture, blood (routine x 2)     Status: None (Preliminary result)   Collection Time: 01/06/21  5:35 PM   Specimen: BLOOD  Result Value Ref Range Status   Specimen Description BLOOD LEFT ANTECUBITAL  Final  Special Requests   Final    BOTTLES DRAWN AEROBIC AND ANAEROBIC Blood Culture adequate volume   Culture   Final    NO GROWTH 4 DAYS Performed at [facility], [address] [place], [place] [address]    Report Status PENDING  Incomplete  Resp Panel by RT-PCR (Flu A&B, Covid) Nasopharyngeal Swab     Status: None   Collection Time: 01/06/21  5:35 PM   Specimen: Nasopharyngeal Swab; Nasopharyngeal(NP) swabs in vial transport medium   Result Value Ref Range Status   SARS Coronavirus 2 by RT PCR NEGATIVE NEGATIVE Final    Comment: (NOTE) SARS-CoV-2 target nucleic acids are NOT DETECTED.  The SARS-CoV-2 RNA is generally detectable in upper respiratory specimens during the acute phase of infection. The lowest concentration of SARS-CoV-2 viral copies this assay can detect is 138 copies/mL. A negative result does not preclude SARS-Cov-2 infection and should not be used as the sole basis for treatment or other patient management decisions. A negative result may occur with  improper specimen collection/handling, submission of specimen other than nasopharyngeal swab, presence of viral mutation(s) within the areas targeted by this assay, and inadequate number of viral copies(<138 copies/mL). A negative result must be combined with clinical observations, patient history, and epidemiological information. The expected result is Negative.  Fact Sheet for Patients:  EntrepreneurPulse.com.au  Fact Sheet for Healthcare Providers:  IncredibleEmployment.be  This test is no t yet approved or cleared by the [place] FDA and  has been authorized for detection and/or diagnosis of SARS-CoV-2 by FDA under an Emergency Use Authorization (EUA). This EUA will remain  in effect (meaning this test can be used) for the duration of the COVID-19 declaration under Section 564(b)(1) of the Act, 21 U.S.C.section 360bbb-3(b)(1), unless the authorization is terminated  or revoked sooner.       Influenza A by PCR NEGATIVE NEGATIVE Final   Influenza B by PCR NEGATIVE NEGATIVE Final    Comment: (NOTE) The Xpert Xpress SARS-CoV-2/FLU/RSV plus assay is intended as an aid in the diagnosis of influenza from Nasopharyngeal swab specimens and should not be used as a sole basis for treatment. Nasal washings and aspirates are unacceptable for Xpert Xpress SARS-CoV-2/FLU/RSV testing.  Fact Sheet for  Patients: EntrepreneurPulse.com.au  Fact Sheet for Healthcare Providers: IncredibleEmployment.be  This test is not yet approved or cleared by the [place] FDA and has been authorized for detection and/or diagnosis of SARS-CoV-2 by FDA under an Emergency Use Authorization (EUA). This EUA will remain in effect (meaning this test can be used) for the duration of the COVID-19 declaration under Section 564(b)(1) of the Act, 21 U.S.C. section 360bbb-3(b)(1), unless the authorization is terminated or revoked.  Performed at [facility], [address] [place], [place] [address]   Culture, blood (routine x 2)     Status: None (Preliminary result)   Collection Time: 01/06/21  5:41 PM   Specimen: BLOOD  Result Value Ref Range Status   Specimen Description BLOOD LEFT ANTECUBITAL  Final   Special Requests   Final    BOTTLES DRAWN AEROBIC AND ANAEROBIC Blood Culture adequate volume   Culture   Final    NO GROWTH 4 DAYS Performed at [facility], [address], [place], [place] [address]    Report Status PENDING  Incomplete  Surgical PCR screen     Status: None   Collection Time: 01/07/21  9:15 PM   Specimen: Nasal Mucosa; Nasal Swab  Result Value Ref Range Status  MRSA, PCR NEGATIVE NEGATIVE Final   Staphylococcus aureus NEGATIVE NEGATIVE Final    Comment: (NOTE) The Xpert SA Assay (FDA approved for NASAL specimens in patients 54 years of age and older), is one component of a comprehensive surveillance program. It is not intended to diagnose infection nor to guide or monitor treatment. Performed at [facility], [address] [place],  [address]          Radiology Studies: No results found.      Scheduled Meds:  insulin aspart  0-15 Units Subcutaneous TID WC   insulin aspart  0-5 Units Subcutaneous QHS   insulin glargine-yfgn  10 Units Subcutaneous Daily   Continuous Infusions:  sodium  chloride Stopped (01/08/21 0304)   ceFEPime (MAXIPIME) IV Stopped (01/10/21 0850)   vancomycin Stopped (01/10/21 0339)     LOS: 3 days    Time spent: 37 minutes spent on chart review, discussion with nursing staff, consultants, updating family and interview/physical exam; more than 50% of that time was spent in counseling and/or coordination of care.    Zitlaly Malson J [place] DO [facility] Available via Epic secure chat 7am-7pm After these hours, please refer to coverage provider listed on [email or web address] 01/10/2021, 12:42 PM

## 2021-01-11 ENCOUNTER — Inpatient Hospital Stay: Payer: Self-pay | Admitting: Infectious Diseases

## 2021-01-11 DIAGNOSIS — L089 Local infection of the skin and subcutaneous tissue, unspecified: Secondary | ICD-10-CM

## 2021-01-11 DIAGNOSIS — M86171 Other acute osteomyelitis, right ankle and foot: Secondary | ICD-10-CM

## 2021-01-11 DIAGNOSIS — E11628 Type 2 diabetes mellitus with other skin complications: Secondary | ICD-10-CM

## 2021-01-11 LAB — VANCOMYCIN, PEAK
Vancomycin Pk: 55 ug/mL (ref 30–40)
Vancomycin Pk: 47 ug/mL — ABNORMAL HIGH (ref 30–40)

## 2021-01-11 LAB — CULTURE, BLOOD (ROUTINE X 2)
Culture: NO GROWTH
Culture: NO GROWTH
Special Requests: ADEQUATE
Special Requests: ADEQUATE

## 2021-01-11 LAB — GLUCOSE, CAPILLARY
Glucose-Capillary: 208 mg/dL — ABNORMAL HIGH (ref 70–99)
Glucose-Capillary: 151 mg/dL — ABNORMAL HIGH (ref 70–99)
Glucose-Capillary: 178 mg/dL — ABNORMAL HIGH (ref 70–99)
Glucose-Capillary: 185 mg/dL — ABNORMAL HIGH (ref 70–99)

## 2021-01-11 LAB — CREATININE, SERUM
Creatinine, Ser: 0.64 mg/dL (ref 0.61–1.24)
GFR, Estimated: >60 mL/min (ref >60)

## 2021-01-11 NOTE — Progress Notes (Addendum)
PROGRESS NOTE    Fernando Vega  GGY:[id number] DOB: 01/12/1971 DOA: 01/06/2021 PCP: Langston Reusing, NP    Brief Narrative:  Fernando Vega is a 50 year old male with past medical history significant for DM2, essential hypertension, OSA on CPAP, with history of diabetic foot wound/osteomyelitis with recent transmetatarsal amputation on 10/3 by podiatry who presents to [facility] ED by discretion of his podiatrist for wound dehiscence, cellulitis and bone exposure.  Patient reported moderate intensity pain and throbbing at his stump site.  Denies fever/chills.  No other complaints at the time.  In the ED, temperature 98.3 F, HR 89, RR 16, BP 136/77, SPO2 98% on room air.  Sodium 137, potassium 3.7, chloride 100, CO2 27, BUN 19, creatinine 0.1, glucose 281.  Lactic acid 1.2, WBC count 8.0, hemoglobin 11.0, platelets 444.  Cova-19 PCR negative.  Influenza A/B PCR negative.  MR right foot with and without contrast with findings suggestive of acute osteomyelitis to residual first/fourth/fifth metatarsals, soft tissue wound ulceration concerning for tissue necrosis, no abscess/fluid collection identified.  EDP consulted hospitalist service for further evaluation and treatment of acute osteomyelitis right foot/stump.   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  MR right foot with and without contrast with findings suggestive of acute osteomyelitis to residual first/fourth/fifth metatarsals associated with tissue necrosis and no abscess.  Patient underwent debridement, Lisfranc amputation and bone biopsy by  podiatry Dr. Jacqualyn Posey on 01/08/2021. --Blood cultures x2: No growth x 5 days -- Operative cultures/biopsy ordered but unfortunately does not look like this was sent. --Vancomycin, pharmacy consulted for dosing/monitoring --Cefepime 2 g IV q8h --Norco 5-325 mg 1-2 tablets q4h PRN moderate pain --Morphine 2 mg IV q2h PRN severe pain --Nonweightbearing right lower extremity --Continue monitor drain output --Will likely need prolonged IV antibiotics per podiatry, ID consult  Type 2 diabetes mellitus, with hyperglycemia Hemoglobin A1c 8.0 on 12/11/2020, not optimally controlled. (But improved since 11/01/2020 with A1c 11.8).  Home regimen includes insulin glargine 15 units subcutaneously daily, Humalog sliding scale. --Semglee 14u Accord daily --Moderate SSI for coverage --CBGs before every meal/at bedtime  OSA: Continue nocturnal CPAP  Essential hypertension Currently not on any antihypertensives at home. --Continue monitor BP closely --Hydralazine 25 mg PO q6h prn SBP >170 or DBP >110   DVT prophylaxis: SCD's Start: 01/09/21 0101   Code Status: Full Code Family Communication: No family present at bedside this morning  Disposition Plan:  Level of care: Med-Surg Status is: Inpatient  Remains inpatient appropriate because: Acute osteomyelitis, IV antibiotics    Consultants:  Podiatry, Dr. Jacqualyn Posey  Procedures:  Debridement, Lisfranc amputation, biopsy with drain placement, podiatry, Dr. Jacqualyn Posey 10/29  Antimicrobials:  Vancomycin 10/27>> Cefepime 10/27>> Cipro 10/27 - 10/27   Subjective: Patient seen examined at bedside, resting comfortably.  Patient reports some pain this morning, about to receive pain medication.  Remains on IV antibiotics per podiatry.  Awaiting results of operative cultures.  No other questions or concerns at this time.  Denies headache, no fever/chills/night sweats, no nausea/vomiting/diarrhea, no chest pain, palpitations, no shortness of breath, no abdominal  pain, no weakness,  no fatigue, no paresthesias.  No acute events overnight per nursing.  Objective: Vitals:   01/10/21 2111 01/11/21 0335 01/11/21 0740 01/11/21 1100  BP: 131/79 133/83 132/79 130/77  Pulse: 83 81 85 83  Resp: 19 17 18 19   Temp: 98.3 F (36.8 C) 98.2 F (36.8 C) 98.5 F (36.9 C) 97.6 F (36.4 C)  TempSrc: Oral Oral Oral Oral  SpO2: 98% 98% 99% 92%  Weight:      Height:        Intake/Output Summary (Last 24 hours) at 01/11/2021 1301 Last data filed at 01/11/2021 1020 Gross per 24 hour  Intake 1567.97 ml  Output 2500 ml  Net -932.03 ml   Filed Weights   01/06/21 1704  Weight: 85.7 kg    Examination:  General exam: Appears calm and comfortable  Respiratory system: Clear to auscultation. Respiratory effort normal.  On room air Cardiovascular system: S1 & S2 heard, RRR. No JVD, murmurs, rubs, gallops or clicks.  1+ pitting edema bilateral lower extremities. Gastrointestinal system: Abdomen is nondistended, soft and nontender. No organomegaly or masses felt. Normal bowel sounds heard. Central nervous system: Alert and oriented. No focal neurological deficits. Extremities: Moves all extremities independently, strength preserved, noted right foot with dressing/Ace wrap in place, JP drain noted, dressing clean/dry/intact Skin: No rashes, lesions or ulcers Psychiatry: Judgement and insight appear normal. Mood & affect appropriate.       Data Reviewed: I have personally reviewed following labs and imaging studies  CBC: Recent Labs  Lab 01/06/21 1735 01/09/21 0424 01/10/21 0404  WBC 8.0 10.6* 9.2  NEUTROABS 4.2  --   --   HGB 11.0* 9.0* 8.6*  HCT 34.4* 29.4* 28.3*  MCV 84.3 84.7 84.0  PLT 444* 356 517   Basic Metabolic Panel: Recent Labs  Lab 01/06/21 1735 01/09/21 0424 01/10/21 0404 01/11/21 0434  NA 137 136 135  --   K 3.7 4.3 3.9  --   CL 100 103 101  --   CO2 27 27 28   --   GLUCOSE 281* 213* 157*  --   BUN 19 26* 16  --   CREATININE 0.81  0.91 0.65 0.64  CALCIUM 8.9 8.3* 8.3*  --    GFR: Estimated Creatinine Clearance: 112.9 mL/min (by C-G formula based on SCr of 0.64 mg/dL). Liver Function Tests: No results for input(s): AST, ALT, ALKPHOS, BILITOT, PROT, ALBUMIN in the last 168 hours. No results for input(s): LIPASE, AMYLASE in the last 168 hours. No results for input(s): AMMONIA in the last 168 hours. Coagulation Profile: No results for input(s): INR, PROTIME in the last 168 hours. Cardiac Enzymes: No results for input(s): CKTOTAL, CKMB, CKMBINDEX, TROPONINI in the last 168 hours. BNP (last 3 results) No results for input(s): PROBNP in the last 8760 hours. HbA1C: No results for input(s): HGBA1C in the last 72 hours. CBG: Recent Labs  Lab 01/10/21 1146 01/10/21 1605 01/10/21 2120 01/11/21 0808 01/11/21 1150  GLUCAP 142* 130* 215* 208* 151*   Lipid Profile: No results for input(s): CHOL, HDL, LDLCALC, TRIG, CHOLHDL, LDLDIRECT in the last 72 hours. Thyroid Function Tests: No results for input(s): TSH, T4TOTAL, FREET4, T3FREE, THYROIDAB in the last 72 hours. Anemia Panel: No results for input(s): VITAMINB12, FOLATE, FERRITIN, TIBC, IRON, RETICCTPCT in the last 72 hours. Sepsis Labs: Recent Labs  Lab 01/06/21 1735 01/07/21 0701  LATICACIDVEN 1.2 0.8    Recent Results (from the past 240 hour(s))  Culture, blood (routine x 2)     Status: None  Collection Time: 01/06/21  5:35 PM   Specimen: BLOOD  Result Value Ref Range Status   Specimen Description BLOOD LEFT ANTECUBITAL  Final   Special Requests   Final    BOTTLES DRAWN AEROBIC AND ANAEROBIC Blood Culture adequate volume   Culture   Final    NO GROWTH 5 DAYS Performed at [facility], [address] [place], [place] [address]    Report Status 01/11/2021 FINAL  Final  Resp Panel by RT-PCR (Flu A&B, Covid) Nasopharyngeal Swab     Status: None   Collection Time: 01/06/21  5:35 PM   Specimen: Nasopharyngeal Swab; Nasopharyngeal(NP) swabs in  vial transport medium  Result Value Ref Range Status   SARS Coronavirus 2 by RT PCR NEGATIVE NEGATIVE Final    Comment: (NOTE) SARS-CoV-2 target nucleic acids are NOT DETECTED.  The SARS-CoV-2 RNA is generally detectable in upper respiratory specimens during the acute phase of infection. The lowest concentration of SARS-CoV-2 viral copies this assay can detect is 138 copies/mL. A negative result does not preclude SARS-Cov-2 infection and should not be used as the sole basis for treatment or other patient management decisions. A negative result may occur with  improper specimen collection/handling, submission of specimen other than nasopharyngeal swab, presence of viral mutation(s) within the areas targeted by this assay, and inadequate number of viral copies(<138 copies/mL). A negative result must be combined with clinical observations, patient history, and epidemiological information. The expected result is Negative.  Fact Sheet for Patients:  EntrepreneurPulse.com.au  Fact Sheet for Healthcare Providers:  IncredibleEmployment.be  This test is no t yet approved or cleared by the [place] FDA and  has been authorized for detection and/or diagnosis of SARS-CoV-2 by FDA under an Emergency Use Authorization (EUA). This EUA will remain  in effect (meaning this test can be used) for the duration of the COVID-19 declaration under Section 564(b)(1) of the Act, 21 U.S.C.section 360bbb-3(b)(1), unless the authorization is terminated  or revoked sooner.       Influenza A by PCR NEGATIVE NEGATIVE Final   Influenza B by PCR NEGATIVE NEGATIVE Final    Comment: (NOTE) The Xpert Xpress SARS-CoV-2/FLU/RSV plus assay is intended as an aid in the diagnosis of influenza from Nasopharyngeal swab specimens and should not be used as a sole basis for treatment. Nasal washings and aspirates are unacceptable for Xpert Xpress SARS-CoV-2/FLU/RSV testing.  Fact  Sheet for Patients: EntrepreneurPulse.com.au  Fact Sheet for Healthcare Providers: IncredibleEmployment.be  This test is not yet approved or cleared by the [place] FDA and has been authorized for detection and/or diagnosis of SARS-CoV-2 by FDA under an Emergency Use Authorization (EUA). This EUA will remain in effect (meaning this test can be used) for the duration of the COVID-19 declaration under Section 564(b)(1) of the Act, 21 U.S.C. section 360bbb-3(b)(1), unless the authorization is terminated or revoked.  Performed at [facility], [address] [place], [place] [address]   Culture, blood (routine x 2)     Status: None   Collection Time: 01/06/21  5:41 PM   Specimen: BLOOD  Result Value Ref Range Status   Specimen Description BLOOD LEFT ANTECUBITAL  Final   Special Requests   Final    BOTTLES DRAWN AEROBIC AND ANAEROBIC Blood Culture adequate volume   Culture   Final    NO GROWTH 5 DAYS Performed at [facility], [address] [place], [place] [address]    Report Status 01/11/2021 FINAL  Final  Surgical PCR screen  Status: None   Collection Time: 01/07/21  9:15 PM   Specimen: Nasal Mucosa; Nasal Swab  Result Value Ref Range Status   MRSA, PCR NEGATIVE NEGATIVE Final   Staphylococcus aureus NEGATIVE NEGATIVE Final    Comment: (NOTE) The Xpert SA Assay (FDA approved for NASAL specimens in patients 75 years of age and older), is one component of a comprehensive surveillance program. It is not intended to diagnose infection nor to guide or monitor treatment. Performed at [facility], [address] [place], [place] [address]          Radiology Studies: No results found.      Scheduled Meds:  insulin aspart  0-15 Units Subcutaneous TID WC   insulin aspart  0-5 Units Subcutaneous QHS   insulin glargine-yfgn  14 Units Subcutaneous Daily   senna-docusate  1 tablet Oral BID    Continuous Infusions:  sodium chloride Stopped (01/08/21 0304)   ceFEPime (MAXIPIME) IV 2 g (01/11/21 0846)   vancomycin 1,250 mg (01/11/21 0303)     LOS: 4 days    Time spent: 37 minutes spent on chart review, discussion with nursing staff, consultants, updating family and interview/physical exam; more than 50% of that time was spent in counseling and/or coordination of care.    Corey Laski J [place] DO [facility] Available via Epic secure chat 7am-7pm After these hours, please refer to coverage provider listed on [email or web address] 01/11/2021, 1:01 PM

## 2021-01-11 NOTE — Progress Notes (Signed)
   PODIATRY PROGRESS NOTE  NAME Fernando Vega MRN [id number] DOB 07-05-1970 DOA 01/06/2021   CC: Diabetic foot infection RT foot Chief Complaint  Patient presents with   Foot Pain   Wound Infection    History of present illness: 50 y.o. male s/p revisional amputation of the right foot secondary to osteomyelitis DOS: 01/08/2021.  Disarticulation at the TMT Lisfranc joint.  Bone biopsy.  Wound debridement.  Resting at bedside comfortably.  Patient is doing well.  Dressings have been clean dry and intact since last podiatry visit on 01/09/2021.  Past Medical History:  Diagnosis Date   Diabetes mellitus without complication ([place])    Neuropathy    Sarcoidosis    Sleep apnea     CBC Latest Ref Rng & Units 01/10/2021 01/09/2021 01/06/2021  WBC 4.0 - 10.5 K/uL 9.2 10.6(H) 8.0  Hemoglobin 13.0 - 17.0 g/dL 8.6(L) 9.0(L) 11.0(L)  Hematocrit 39.0 - 52.0 % 28.3(L) 29.4(L) 34.4(L)  Platelets 150 - 400 K/uL 329 356 444(H)    BMP Latest Ref Rng & Units 01/11/2021 01/10/2021 01/09/2021  Glucose 70 - 99 mg/dL - 157(H) 213(H)  BUN 6 - 20 mg/dL - 16 26(H)  Creatinine 0.61 - 1.24 mg/dL 0.64 0.65 0.91  Sodium 135 - 145 mmol/L - 135 136  Potassium 3.5 - 5.1 mmol/L - 3.9 4.3  Chloride 98 - 111 mmol/L - 101 103  CO2 22 - 32 mmol/L - 28 27  Calcium 8.9 - 10.3 mg/dL - 8.3(L) 8.3(L)      Physical Exam: General: The patient is alert and oriented x3 in no acute distress.   Skin incision is well coapted with sutures and staples intact.  JP drain intact.  There appears to be good healing of the amputation site.  At the moment there is no concern for recurrence of infection or dehiscence along the amputation stump.  Surgical pathology 01/08/2021 RT foot: DIAGNOSIS:  A. TOES AND METATARSALS, RIGHT; AMPUTATION:  - ULCERATION AND ACUTE SUPPURATIVE INFLAMMATION.  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  - ACUTE INFLAMMATION EXTENDS TO INKED SOFT TISSUE MARGIN.  - VIABLE BONY MARGINS WITHOUT ACUTE  INFLAMMATION.    ASSESSMENT/PLAN OF CARE 1.  S/P Lisfranc amputation RT foot.  DOS: 01/08/2021 -Overall the amputation site looks healthy with good healing -Order placed for infectious disease consult.  Their recommendation and antibiotic management would be greatly appreciated.  Order placed by Dr. [place] 01/11/2021 -Prior to discharge the JP drain needs to be removed.  Podiatry will do this prior to discharge -Continue nonweightbearing surgical foot -Podiatry will continue to follow   Please contact me directly with any questions or concerns.  Cell [phone]   Edrick Kins, DPM [facility]  Dr. Edrick Kins, DPM    [address], [place] [address]                Office [phone]  Fax [phone]

## 2021-01-11 NOTE — Consult Note (Signed)
NAME: Fernando Vega  DOB: June 17, 1970  MRN: [id number]  Date/Time: 01/11/2021 5:17 PM  REQUESTING PROVIDER: Felecia Shelling Subjective:  REASON FOR CONSULT: rt foot infection ?pt known to me from previous hospitalizations and clinic follow up Fernando Vega is a 50 y.o. with a history of Dm , complicated rt foot infection presents from podiatrist office with worsening infection of the TMA stump Patient is a complicated right foot infection.  And August he was hospitalized between 11/01/2020 until 11/16/2020 for infection of the foot.  He apparently sustained the injury when he was working in [employer] and thought a screw would have gone through his shoe.  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  Wound culture was positive for staph aureus.  On 11/09/2020 patient was taken back to the OR for further debridement and application of wound VAC.  Because of lack of insurance he could not be discharged with a wound VAC at home.  He went home on IV Ancef through PICC line to complete 6 weeks of antibiotics.  I will see him as outpatient on 12/07/2020 and has a foot did not look good as repeated the cultures and that showed Acinetobacter, Enterobacter cloacae.  He was hospitalized on 12/11/2020 with worsening swelling pain and redness.  He was started on meropenem.  He was taken to the OR and underwent TMA on 12/13/2020.  He underwent delayed primary closure of the right TMA site on 12/16/2020.  Patient did not want to go home on IV antibiotic and preferred to take oral antibiotic and hence he was sent home on ciprofloxacin 500 mg twice a day for 28 days on 12/20/2020.  Patient went to see the podiatrist on 01/06/2021 and asked the wound at the TMA site had eschar and infection he was asked to go to the ED.  He presented to the ED on 01/06/2021 Vitals in the ED BP 136/77, temperature 98.3, pulse rate 89 and sats of 98%.  Hb was 11, WBC 8 and platelet 444.  Creatinine was  0.81.  Blood culture was sent and he was started on cefepime, Vanco, Flagyl.  MRI of the right foot showed postsurgical changes from prior right foot amputation. He was taken to the OR on 01/08/21 by Dr.Wagoner and underwent debridement wound and Lisfranc amputation- I am asked to see the patient for infection management Past Medical History:  Diagnosis Date   Diabetes mellitus without complication ([place])    Neuropathy    Sarcoidosis    Sleep apnea     Past Surgical History:  Procedure Laterality Date   AMPUTATION Right 12/13/2020   Procedure: AMPUTATION midFOOT-RIGHT;  Surgeon: Criselda Peaches, DPM;  Location: [facility];  Service: Podiatry;  Laterality: Right;   AMPUTATION Right 11/05/2020   Procedure: AMPUTATION RAY-Partial Ray Amp 2nd & 3rd Digit;  Surgeon: Edrick Kins, DPM;  Location: [facility];  Service: Podiatry;  Laterality: Right;   APPLICATION OF WOUND VAC Right 11/09/2020   Procedure: APPLICATION OF WOUND VAC;  Surgeon: Edrick Kins, DPM;  Location: [facility];  Service: Podiatry;  Laterality: Right;   BONE BIOPSY Right 01/08/2021   Procedure: BONE BIOPSY;  Surgeon: Trula Slade, DPM;  Location: [facility];  Service: Podiatry;  Laterality: Right;   INCISION AND DRAINAGE Right 11/01/2020   Procedure: INCISION AND DRAINAGE-Right Foot;  Surgeon: Criselda Peaches, DPM;  Location: [facility];  Service: Podiatry;  Laterality: Right;  IRRIGATION AND DEBRIDEMENT FOOT Right 11/09/2020   Procedure: IRRIGATION AND DEBRIDEMENT FOOT;  Surgeon: Edrick Kins, DPM;  Location: [facility];  Service: Podiatry;  Laterality: Right;   IRRIGATION AND DEBRIDEMENT FOOT Right 12/13/2020   Procedure: IRRIGATION AND DEBRIDEMENT FOOT;  Surgeon: Criselda Peaches, DPM;  Location: [facility];  Service: Podiatry;  Laterality: Right;   TRANSMETATARSAL AMPUTATION Right 01/08/2021   Procedure: LISFRANC  AMPUTATION;  Surgeon: Trula Slade, DPM;  Location: [facility];  Service: Podiatry;  Laterality: Right;   WOUND  DEBRIDEMENT Right 12/16/2020   Procedure: DEBRIDEMENT WOUND;  Surgeon: Felipa Furnace, DPM;  Location: [facility];  Service: Podiatry;  Laterality: Right;  Transmet amputation washout & closure   WOUND DEBRIDEMENT Right 01/08/2021   Procedure: DEBRIDEMENT WOUND;  Surgeon: Trula Slade, DPM;  Location: [facility];  Service: Podiatry;  Laterality: Right;    Social History   Socioeconomic History   Marital status: Not on file    Spouse name: Not on file   Number of children: Not on file   Years of education: Not on file   Highest education level: Not on file  Occupational History   Not on file  Tobacco Use   Smoking status: Never   Smokeless tobacco: Never  Substance and Sexual Activity   Alcohol use: No   Drug use: No   Sexual activity: Not on file  Other Topics Concern   Not on file  Social History Narrative   Not on file   Social Determinants of Health   Financial Resource Strain: Not on file  Food Insecurity: Not on file  Transportation Needs: Not on file  Physical Activity: Not on file  Stress: Not on file  Social Connections: Not on file  Intimate Partner Violence: Not on file    Family History  Problem Relation Age of Onset   Hypertension Mother    Diabetes type II Father    Parkinson's disease Father    Heart disease Father    No Known Allergies I? Current Facility-Administered Medications  Medication Dose Route Frequency Provider Last Rate Last Admin   0.9 %  sodium chloride infusion   Intravenous PRN Athena Masse, MD   Stopped at 01/08/21 0304   acetaminophen (TYLENOL) tablet 650 mg  650 mg Oral Q6H PRN Athena Masse, MD       Or   acetaminophen (TYLENOL) suppository 650 mg  650 mg Rectal Q6H PRN Athena Masse, MD       ceFEPIme (MAXIPIME) 2 g in sodium chloride 0.9 % 100 mL IVPB  2 g Intravenous Q8H Judd Gaudier V, MD 200 mL/hr at 01/11/21 1712 2 g at 01/11/21 1712   hydrALAZINE (APRESOLINE) tablet 25 mg  25 mg Oral Q6H PRN [place] Eric J, DO        HYDROcodone-acetaminophen (NORCO/VICODIN) 5-325 MG per tablet 1-2 tablet  1-2 tablet Oral Q4H PRN Athena Masse, MD   2 tablet at 01/11/21 0857   insulin aspart (novoLOG) injection 0-15 Units  0-15 Units Subcutaneous TID WC [place] Eric J, DO   3 Units at 01/11/21 1709   insulin aspart (novoLOG) injection 0-5 Units  0-5 Units Subcutaneous QHS [place] Eric J, DO   2 Units at 01/10/21 2210   insulin glargine-yfgn [facility]) injection 14 Units  14 Units Subcutaneous Daily [place] Eric J, [place]   14 Units at 01/11/21 0844   morphine 2 MG/ML injection 2 mg  2 mg Intravenous Q2H PRN Judd Gaudier  V, MD       ondansetron (ZOFRAN) tablet 4 mg  4 mg Oral Q6H PRN Athena Masse, MD       Or   ondansetron [facility]) injection 4 mg  4 mg Intravenous Q6H PRN Athena Masse, MD   4 mg at 01/08/21 0272   senna-docusate (Senokot-S) tablet 1 tablet  1 tablet Oral BID [place] Donnamarie Poag, DO       vancomycin (VANCOREADY) IVPB 1250 mg/250 mL  1,250 mg Intravenous Q12H Athena Masse, MD 166.7 mL/hr at 01/11/21 1503 1,250 mg at 01/11/21 1503     Abtx:  Anti-infectives (From admission, onward)    Start     Dose/Rate Route Frequency Ordered Stop   01/07/21 1400  vancomycin (VANCOREADY) IVPB 1250 mg/250 mL        1,250 mg 166.7 mL/hr over 90 Minutes Intravenous Every 12 hours 01/07/21 0054     01/07/21 0100  vancomycin (VANCOREADY) IVPB 2000 mg/400 mL        2,000 mg 200 mL/hr over 120 Minutes Intravenous  Once 01/07/21 0048 01/07/21 0504   01/07/21 0100  ceFEPIme (MAXIPIME) 2 g in sodium chloride 0.9 % 100 mL IVPB        2 g 200 mL/hr over 30 Minutes Intravenous Every 8 hours 01/07/21 0055     01/07/21 0000  vancomycin (VANCOCIN) IVPB 1000 mg/200 mL premix  Status:  Discontinued        1,000 mg 200 mL/hr over 60 Minutes Intravenous  Once 01/06/21 2357 01/06/21 2357   01/07/21 0000  ceFEPIme (MAXIPIME) 2 g in sodium chloride 0.9 % 100 mL IVPB  Status:  Discontinued        2 g 200 mL/hr over 30 Minutes  Intravenous  Once 01/06/21 2358 01/07/21 0054   01/07/21 0000  ciprofloxacin (CIPRO) IVPB 400 mg        400 mg 200 mL/hr over 60 Minutes Intravenous  Once 01/06/21 2358 01/07/21 0303       REVIEW OF SYSTEMS:  Const: negative fever, negative chills, negative weight loss Eyes: negative diplopia or visual changes, negative eye pain ENT: negative coryza, negative sore throat Resp: negative cough, hemoptysis, dyspnea Cards: negative for chest pain, palpitations, lower extremity edema GU: negative for frequency, dysuria and hematuria GI: Negative for abdominal pain, diarrhea, bleeding, constipation Skin: negative for rash and pruritus Heme: negative for easy bruising and gum/nose bleeding MS: negative for myalgias, arthralgias, back pain and muscle weakness Rt foot - he has been minimally bearing weight- uses a knee scooter Neurolo:negative for headaches, dizziness, vertigo, memory problems  Psych: negative for feelings of anxiety, depression  Endocrine:  diabetes Allergy/Immunology- negative for any medication or food allergies ?  Objective:  VITALS:  BP 124/67 (BP Location: Left Arm)   Pulse 87   Temp 98.9 F (37.2 C)   Resp 15   Ht 5\' 7"  (1.702 m)   Wt 85.7 kg   SpO2 99%   BMI 29.60 kg/m  PHYSICAL EXAM:  General: Alert, cooperative, no distress, appears stated age.  Head: Normocephalic, without obvious abnormality, atraumatic. Eyes: Conjunctivae clear, anicteric sclerae. Pupils are equal ENT Nares normal. No drainage or sinus tenderness. Lips, mucosa, and tongue normal. No Thrush Neck: Supple, symmetrical, no adenopathy, thyroid: non tender no carotid bruit and no JVD. Back: No CVA tenderness. Lungs: Clear to auscultation bilaterally. No Wheezing or Rhonchi. No rales. Heart: Regular rate and rhythm, no murmur, rub or gallop. Abdomen: Soft, non-tender,not distended. Bowel sounds  normal. No masses Extremities: rt foot surgical dressing not removed Pictures  reviewed    Before surgery   Skin: No rashes or lesions. Or bruising Lymph: Cervical, supraclavicular normal. Neurologic: Grossly non-focal Pertinent Labs Lab Results CBC    Component Value Date/Time   WBC 9.2 01/10/2021 0404   RBC 3.37 (L) 01/10/2021 0404   HGB 8.6 (L) 01/10/2021 0404   HGB 13.6 12/03/2012 0520   HCT 28.3 (L) 01/10/2021 0404   HCT 39.8 (L) 12/03/2012 0520   PLT 329 01/10/2021 0404   PLT 171 12/03/2012 0520   MCV 84.0 01/10/2021 0404   MCV 91 12/03/2012 0520   MCH 25.5 (L) 01/10/2021 0404   MCHC 30.4 01/10/2021 0404   RDW 14.4 01/10/2021 0404   RDW 12.4 12/03/2012 0520   LYMPHSABS 2.7 01/06/2021 1735   LYMPHSABS 2.7 12/03/2012 0520   MONOABS 0.8 01/06/2021 1735   MONOABS 1.5 (H) 12/03/2012 0520   EOSABS 0.3 01/06/2021 1735   EOSABS 0.3 12/03/2012 0520   BASOSABS 0.0 01/06/2021 1735   BASOSABS 0.0 12/03/2012 0520    CMP Latest Ref Rng & Units 01/11/2021 01/10/2021 01/09/2021  Glucose 70 - 99 mg/dL - 157(H) 213(H)  BUN 6 - 20 mg/dL - 16 26(H)  Creatinine 0.61 - 1.24 mg/dL 0.64 0.65 0.91  Sodium 135 - 145 mmol/L - 135 136  Potassium 3.5 - 5.1 mmol/L - 3.9 4.3  Chloride 98 - 111 mmol/L - 101 103  CO2 22 - 32 mmol/L - 28 27  Calcium 8.9 - 10.3 mg/dL - 8.3(L) 8.3(L)  Total Protein 6.5 - 8.1 g/dL - - -  Total Bilirubin 0.3 - 1.2 mg/dL - - -  Alkaline Phos 38 - 126 U/L - - -  AST 15 - 41 U/L - - -  ALT 0 - 44 U/L - - -      Microbiology: Recent Results (from the past 240 hour(s))  Culture, blood (routine x 2)     Status: None   Collection Time: 01/06/21  5:35 PM   Specimen: BLOOD  Result Value Ref Range Status   Specimen Description BLOOD LEFT ANTECUBITAL  Final   Special Requests   Final    BOTTLES DRAWN AEROBIC AND ANAEROBIC Blood Culture adequate volume   Culture   Final    NO GROWTH 5 DAYS Performed at [facility], [address] [place], [place] [address]    Report Status 01/11/2021 FINAL  Final  Resp Panel by RT-PCR (Flu  A&B, Covid) Nasopharyngeal Swab     Status: None   Collection Time: 01/06/21  5:35 PM   Specimen: Nasopharyngeal Swab; Nasopharyngeal(NP) swabs in vial transport medium  Result Value Ref Range Status   SARS Coronavirus 2 by RT PCR NEGATIVE NEGATIVE Final    Comment: (NOTE) SARS-CoV-2 target nucleic acids are NOT DETECTED.  The SARS-CoV-2 RNA is generally detectable in upper respiratory specimens during the acute phase of infection. The lowest concentration of SARS-CoV-2 viral copies this assay can detect is 138 copies/mL. A negative result does not preclude SARS-Cov-2 infection and should not be used as the sole basis for treatment or other patient management decisions. A negative result may occur with  improper specimen collection/handling, submission of specimen other than nasopharyngeal swab, presence of viral mutation(s) within the areas targeted by this assay, and inadequate number of viral copies(<138 copies/mL). A negative result must be combined with clinical observations, patient history, and epidemiological information. The expected result is Negative.  Fact Sheet for Patients:  EntrepreneurPulse.com.au  Fact Sheet for Healthcare Providers:  IncredibleEmployment.be  This test is no t yet approved or cleared by the [place] FDA and  has been authorized for detection and/or diagnosis of SARS-CoV-2 by FDA under an Emergency Use Authorization (EUA). This EUA will remain  in effect (meaning this test can be used) for the duration of the COVID-19 declaration under Section 564(b)(1) of the Act, 21 U.S.C.section 360bbb-3(b)(1), unless the authorization is terminated  or revoked sooner.       Influenza A by PCR NEGATIVE NEGATIVE Final   Influenza B by PCR NEGATIVE NEGATIVE Final    Comment: (NOTE) The Xpert Xpress SARS-CoV-2/FLU/RSV plus assay is intended as an aid in the diagnosis of influenza from Nasopharyngeal swab specimens  and should not be used as a sole basis for treatment. Nasal washings and aspirates are unacceptable for Xpert Xpress SARS-CoV-2/FLU/RSV testing.  Fact Sheet for Patients: EntrepreneurPulse.com.au  Fact Sheet for Healthcare Providers: IncredibleEmployment.be  This test is not yet approved or cleared by the [place] FDA and has been authorized for detection and/or diagnosis of SARS-CoV-2 by FDA under an Emergency Use Authorization (EUA). This EUA will remain in effect (meaning this test can be used) for the duration of the COVID-19 declaration under Section 564(b)(1) of the Act, 21 U.S.C. section 360bbb-3(b)(1), unless the authorization is terminated or revoked.  Performed at [facility], [address] [place], [place] [address]   Culture, blood (routine x 2)     Status: None   Collection Time: 01/06/21  5:41 PM   Specimen: BLOOD  Result Value Ref Range Status   Specimen Description BLOOD LEFT ANTECUBITAL  Final   Special Requests   Final    BOTTLES DRAWN AEROBIC AND ANAEROBIC Blood Culture adequate volume   Culture   Final    NO GROWTH 5 DAYS Performed at [facility], [address], [place], [place] [address]    Report Status 01/11/2021 FINAL  Final  Surgical PCR screen     Status: None   Collection Time: 01/07/21  9:15 PM   Specimen: Nasal Mucosa; Nasal Swab  Result Value Ref Range Status   MRSA, PCR NEGATIVE NEGATIVE Final   Staphylococcus aureus NEGATIVE NEGATIVE Final    Comment: (NOTE) The Xpert SA Assay (FDA approved for NASAL specimens in patients 54 years of age and older), is one component of a comprehensive surveillance program. It is not intended to diagnose infection nor to guide or monitor treatment. Performed at [facility], [address] [place], [place] [address]     IMAGING RESULTS: MRI foot reviewed- see above I have personally reviewed the  films ? Impression/Recommendation ? 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 Then he had acinetobacter and enterobacter and got cipro, was on cipro when he got admitted this time Currently on vanco and cefepime- cultures were sent as per podiatrist note but lab has not processed it- will call and find out- will send the bloody fluid from the drain for culture Pt has already expressed his wish of not doing IV antibiotic at home- let him know that it may not be possible   DM on insulin  Anemia  Discussed the management with the patient and care team ? __ Note:  This document was prepared using Dragon voice recognition software and may  include unintentional dictation errors.

## 2021-01-12 LAB — BASIC METABOLIC PANEL
Anion gap: 6 (ref 5–15)
BUN: 15 mg/dL (ref 6–20)
CO2: 29 mmol/L (ref 22–32)
Calcium: 8.7 mg/dL — ABNORMAL LOW (ref 8.9–10.3)
Chloride: 102 mmol/L (ref 98–111)
Creatinine, Ser: 0.7 mg/dL (ref 0.61–1.24)
GFR, Estimated: >60 mL/min (ref >60)
Glucose, Bld: 176 mg/dL — ABNORMAL HIGH (ref 70–99)
Potassium: 4.1 mmol/L (ref 3.5–5.1)
Sodium: 137 mmol/L (ref 135–145)

## 2021-01-12 LAB — GLUCOSE, CAPILLARY
Glucose-Capillary: 163 mg/dL — ABNORMAL HIGH (ref 70–99)
Glucose-Capillary: 162 mg/dL — ABNORMAL HIGH (ref 70–99)
Glucose-Capillary: 184 mg/dL — ABNORMAL HIGH (ref 70–99)
Glucose-Capillary: 187 mg/dL — ABNORMAL HIGH (ref 70–99)

## 2021-01-12 LAB — CBC
HCT: 27.9 % — ABNORMAL LOW (ref 39.0–52.0)
Hemoglobin: 9 g/dL — ABNORMAL LOW (ref 13.0–17.0)
MCH: 26.9 pg (ref 26.0–34.0)
MCHC: 32.3 g/dL (ref 30.0–36.0)
MCV: 83.3 fL (ref 80.0–100.0)
Platelets: 340 10*3/uL (ref 150–400)
RBC: 3.35 MIL/uL — ABNORMAL LOW (ref 4.22–5.81)
RDW: 14.4 % (ref 11.5–15.5)
WBC: 8 10*3/uL (ref 4.0–10.5)
nRBC: 0 % (ref 0.0–0.2)

## 2021-01-12 LAB — SURGICAL PATHOLOGY

## 2021-01-12 LAB — VANCOMYCIN, PEAK: Vancomycin Pk: 31 ug/mL (ref 30–40)

## 2021-01-12 LAB — VANCOMYCIN, TROUGH: Vancomycin Tr: 17 ug/mL (ref 15–20)

## 2021-01-12 NOTE — Progress Notes (Addendum)
Pharmacy Antibiotic Note  Fernando Vega is a 50 y.o. male admitted on 01/06/2021 with  osteomyelitis . PMH includes T2DM, HTN, history of diabetic foot infection, and recent trans-metatarsal amputation. Pharmacy has been consulted for Vancomycin and Cefepime dosing.  Ongoing DFI with 8/22 wound culture growing MSSA, then 10/3 Enterobacter cloacae and Acinetobacter baumannii. Received Lisfranc amputation 10/30. Per podiatry, concern for residual osteomyelitis of the cuneiforms. Unfortunately, surgical cultures were not sent. Continues on day 6 of IV antibiotics.  Vancomycin peak and trough drawn appropriately. Vanc dose 11/3 @0424  (1.5 hr infusion). Peak drawn @0717  resulted 31. Trough drawn @1527  resulted 17. Next vancomycin dose was given 11/3 @1609 .   WBC WNL and afebrile. Renal function has been stable (Scr < 0.8) and c/w baseline.  Plan: Continue vancomycin 1250 mg every 12 hours. Plan to obtain levels in 5 days (~11/7) if renal function continues to be stable. Calculated AUC 577  Cmax 34.3 Cmin 15.9 Goal AUC 400-600, Scr used 0.8    Continue cefepime 2 grams every 8 hours.  Monitor renal function, clinical course, and LOT.  Height: 5\' 7"  (170.2 cm) Weight: 85.7 kg (189 lb) IBW/kg (Calculated) : 66.1  Temp (24hrs), Avg:98.3 F (36.8 C), Min:97.9 F (36.6 C), Max:99 F (37.2 C)  Recent Labs  Lab 01/06/21 1735 01/07/21 0701 01/09/21 0424 01/10/21 0404 01/11/21 0434 01/11/21 0434 01/11/21 1624 01/12/21 0717 01/12/21 1527  WBC 8.0  --  10.6* 9.2  --   --   --  8.0  --   CREATININE 0.81  --  0.91 0.65 0.64  --   --  0.70  --   LATICACIDVEN 1.2 0.8  --   --   --   --   --   --   --   VANCOTROUGH  --   --   --   --   --   --   --   --  17  VANCOPEAK  --   --   --   --  55*   < > 47* 31  --    < > = values in this interval not displayed.     Estimated Creatinine Clearance: 112.9 mL/min (by C-G formula based on SCr of 0.7 mg/dL).    No Known Allergies  Antimicrobials  this admission:  Vancomycin 10/28 >>  Cefepime 10/28 >>   Dose adjustments this admission: None   Microbiology results: 8/22 wound: MSSA 10/3 wound: Acinetobacter baumanii, Enterobater cloacae (S Cefepime) 10/27 BCx: NGTD 10/29 Wound cx: not sent 10/27 MRSA PCR: negative 11/1 wound cx: Ng <12 hours  Thank you for allowing pharmacy to be a part of this patient's care.   Wynelle Cleveland, PharmD Pharmacy Resident  01/12/2021 5:03 PM

## 2021-01-12 NOTE — Progress Notes (Addendum)
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 MRI right foot suggestive of acute osteomyelitis to residual first/fourth/fifth metatarsals associated with tissue necrosis and no abscess. S/p debridement, lisfranc amputation and bone biopsy by podiatry Dr. Jacqualyn Posey on 01/08/2021. Blood cxs NGTD. Operative cultures/biopsy ordered but unfortunately does not look like this was sent. Continue on IV cefepime, vanco. Morphine, norco prn for pain. Nonweightbearing right lower extremity  DM2: poorly controlled, HbA1c 11.8. Continue on glargine, SSI w/ accuchecks    OSA: continue on CPAP qhs   Normocytic anemia: H&H are labile. No need for a transfusion currently     DVT prophylaxis: SCDs. Lovenox when ok w/ podiatry  Code Status: full  Family Communication:  Disposition Plan: depends on PT/OT recs when ok w/ podiatry   Level of care: Med-Surg  Status is: Inpatient  Remains inpatient appropriate because: severity of illness     Consultants:  Podiatry   Procedures:   Antimicrobials: cefepime, vanco    Subjective: Pt c/o fatigue   Objective: Vitals:   01/11/21 1527 01/11/21 1900 01/12/21 0454 01/12/21 0740  BP: 124/67 130/70 (!) 149/86 134/81  Pulse: 87 80 88 84  Resp: 15 20 20 16   Temp: 98.9 F (37.2 C) 98 F (36.7 C) 99 F (37.2 C) 98.2 F (36.8 C)  TempSrc:      SpO2: 99%  97% 98%  Weight:      Height:        Intake/Output Summary (Last 24 hours) at 01/12/2021 0823 Last data filed at 01/12/2021 0748 Gross per 24 hour  Intake 1142.03 ml  Output 2100 ml  Net  -957.97 ml   Filed Weights   01/06/21 1704  Weight: 85.7 kg    Examination:  General exam: Appears calm and comfortable  Respiratory system: Clear to auscultation. Respiratory effort normal. Cardiovascular system: S1 & S2 +. No rubs, gallops or clicks.  Gastrointestinal system: Abdomen is nondistended, soft and nontender. Normal bowel sounds heard. Central nervous system: Alert and oriented. Moves all 3 extremities & right TMA stump  Psychiatry: Judgement and insight appear normal. Mood & affect appropriate.     Data Reviewed: I have personally reviewed following labs and imaging studies  CBC: Recent Labs  Lab 01/06/21 1735 01/09/21 0424 01/10/21 0404 01/12/21 0717  WBC 8.0 10.6* 9.2 8.0  NEUTROABS 4.2  --   --   --   HGB 11.0* 9.0* 8.6* 9.0*  HCT 34.4* 29.4* 28.3* 27.9*  MCV 84.3 84.7 84.0 83.3  PLT 444* 356 329 323   Basic Metabolic Panel: Recent Labs  Lab 01/06/21 1735 01/09/21 0424 01/10/21 0404 01/11/21 0434 01/12/21 0717  NA 137 136 135  --  137  K 3.7 4.3 3.9  --  4.1  CL 100 103 101  --  102  CO2 27 27 28   --  29  GLUCOSE 281* 213* 157*  --  176*  BUN 19 26* 16  --  15  CREATININE 0.81 0.91 0.65 0.64 0.70  CALCIUM 8.9 8.3* 8.3*  --  8.7*   GFR: Estimated Creatinine Clearance:  112.9 mL/min (by C-G formula based on SCr of 0.7 mg/dL). Liver Function Tests: No results for input(s): AST, ALT, ALKPHOS, BILITOT, PROT, ALBUMIN in the last 168 hours. No results for input(s): LIPASE, AMYLASE in the last 168 hours. No results for input(s): AMMONIA in the last 168 hours. Coagulation Profile: No results for input(s): INR, PROTIME in the last 168 hours. Cardiac Enzymes: No results for input(s): CKTOTAL, CKMB, CKMBINDEX, TROPONINI in the last 168 hours. BNP (last 3 results) No results for input(s): PROBNP in the last 8760 hours. HbA1C: No results for input(s): HGBA1C in the last 72 hours. CBG: Recent Labs  Lab 01/11/21 0808 01/11/21 1150 01/11/21 1653  01/11/21 2007 01/12/21 0743  GLUCAP 208* 151* 178* 185* 163*   Lipid Profile: No results for input(s): CHOL, HDL, LDLCALC, TRIG, CHOLHDL, LDLDIRECT in the last 72 hours. Thyroid Function Tests: No results for input(s): TSH, T4TOTAL, FREET4, T3FREE, THYROIDAB in the last 72 hours. Anemia Panel: No results for input(s): VITAMINB12, FOLATE, FERRITIN, TIBC, IRON, RETICCTPCT in the last 72 hours. Sepsis Labs: Recent Labs  Lab 01/06/21 1735 01/07/21 0701  LATICACIDVEN 1.2 0.8    Recent Results (from the past 240 hour(s))  Culture, blood (routine x 2)     Status: None   Collection Time: 01/06/21  5:35 PM   Specimen: BLOOD  Result Value Ref Range Status   Specimen Description BLOOD LEFT ANTECUBITAL  Final   Special Requests   Final    BOTTLES DRAWN AEROBIC AND ANAEROBIC Blood Culture adequate volume   Culture   Final    NO GROWTH 5 DAYS Performed at [facility], [address] [place],  [address]    Report Status 01/11/2021 FINAL  Final  Resp Panel by RT-PCR (Flu A&B, Covid) Nasopharyngeal Swab     Status: None   Collection Time: 01/06/21  5:35 PM   Specimen: Nasopharyngeal Swab; Nasopharyngeal(NP) swabs in vial transport medium  Result Value Ref Range Status   SARS Coronavirus 2 by RT PCR NEGATIVE NEGATIVE Final    Comment: (NOTE) SARS-CoV-2 target nucleic acids are NOT DETECTED.  The SARS-CoV-2 RNA is generally detectable in upper respiratory specimens during the acute phase of infection. The lowest concentration of SARS-CoV-2 viral copies this assay can detect is 138 copies/mL. A negative result does not preclude SARS-Cov-2 infection and should not be used as the sole basis for treatment or other patient management decisions. A negative result may occur with  improper specimen collection/handling, submission of specimen other than nasopharyngeal swab, presence of viral mutation(s) within the areas targeted by this assay, and inadequate number of  viral copies(<138 copies/mL). A negative result must be combined with clinical observations, patient history, and epidemiological information. The expected result is Negative.  Fact Sheet for Patients:  EntrepreneurPulse.com.au  Fact Sheet for Healthcare Providers:  IncredibleEmployment.be  This test is no t yet approved or cleared by the [place] FDA and  has been authorized for detection and/or diagnosis of SARS-CoV-2 by FDA under an Emergency Use Authorization (EUA). This EUA will remain  in effect (meaning this test can be used) for the duration of the COVID-19 declaration under Section 564(b)(1) of the Act, 21 U.S.C.section 360bbb-3(b)(1), unless the authorization is terminated  or revoked sooner.       Influenza A by PCR NEGATIVE NEGATIVE Final   Influenza B by PCR NEGATIVE NEGATIVE Final    Comment: (NOTE) The Xpert Xpress SARS-CoV-2/FLU/RSV plus assay is intended as an aid in the diagnosis of influenza from  Nasopharyngeal swab specimens and should not be used as a sole basis for treatment. Nasal washings and aspirates are unacceptable for Xpert Xpress SARS-CoV-2/FLU/RSV testing.  Fact Sheet for Patients: EntrepreneurPulse.com.au  Fact Sheet for Healthcare Providers: IncredibleEmployment.be  This test is not yet approved or cleared by the [place] FDA and has been authorized for detection and/or diagnosis of SARS-CoV-2 by FDA under an Emergency Use Authorization (EUA). This EUA will remain in effect (meaning this test can be used) for the duration of the COVID-19 declaration under Section 564(b)(1) of the Act, 21 U.S.C. section 360bbb-3(b)(1), unless the authorization is terminated or revoked.  Performed at [facility], [address] [place], [place] [address]   Culture, blood (routine x 2)     Status: None   Collection Time: 01/06/21  5:41 PM   Specimen: BLOOD  Result  Value Ref Range Status   Specimen Description BLOOD LEFT ANTECUBITAL  Final   Special Requests   Final    BOTTLES DRAWN AEROBIC AND ANAEROBIC Blood Culture adequate volume   Culture   Final    NO GROWTH 5 DAYS Performed at [facility], [address], [place], [place] [address]    Report Status 01/11/2021 FINAL  Final  Surgical PCR screen     Status: None   Collection Time: 01/07/21  9:15 PM   Specimen: Nasal Mucosa; Nasal Swab  Result Value Ref Range Status   MRSA, PCR NEGATIVE NEGATIVE Final   Staphylococcus aureus NEGATIVE NEGATIVE Final    Comment: (NOTE) The Xpert SA Assay (FDA approved for NASAL specimens in patients 32 years of age and older), is one component of a comprehensive surveillance program. It is not intended to diagnose infection nor to guide or monitor treatment. Performed at [facility], [address] [place],  [address]          Radiology Studies: No results found.      Scheduled Meds:  insulin aspart  0-15 Units Subcutaneous TID WC   insulin aspart  0-5 Units Subcutaneous QHS   insulin glargine-yfgn  14 Units Subcutaneous Daily   senna-docusate  1 tablet Oral BID   Continuous Infusions:  sodium chloride Stopped (01/08/21 0304)   ceFEPime (MAXIPIME) IV 2 g (01/12/21 0806)   vancomycin 1,250 mg (01/12/21 0424)     LOS: 5 days    Time spent: 32 mins     Wyvonnia Dusky, MD [facility] Pager [phone]  If 7PM-7AM, please contact night-coverage 01/12/2021, 8:23 AM

## 2021-01-12 NOTE — TOC Progression Note (Signed)
Transition of Care [facility]) - Progression Note    Patient Details  Name: Fernando Vega MRN: [id number] Date of Birth: Jun 26, 1970  Transition of Care [facility]) CM/SW [place], RN Phone Number: 01/12/2021, 9:42 AM  Clinical Narrative:    Reached out to [facility] and left a secure Voice mail alerting of the high possibility of the patient needing long term IV ABX, no insurance and living in [place], [place] will continue to follow for needs        Expected Discharge Plan and Services                                                 Social Determinants of Health (SDOH) Interventions    Readmission Risk Interventions No flowsheet data found.

## 2021-01-13 DIAGNOSIS — M869 Osteomyelitis, unspecified: Secondary | ICD-10-CM

## 2021-01-13 DIAGNOSIS — T8743 Infection of amputation stump, right lower extremity: Secondary | ICD-10-CM

## 2021-01-13 LAB — BASIC METABOLIC PANEL
Anion gap: 6 (ref 5–15)
BUN: 16 mg/dL (ref 6–20)
CO2: 28 mmol/L (ref 22–32)
Calcium: 8.6 mg/dL — ABNORMAL LOW (ref 8.9–10.3)
Chloride: 100 mmol/L (ref 98–111)
Creatinine, Ser: 0.56 mg/dL — ABNORMAL LOW (ref 0.61–1.24)
GFR, Estimated: >60 mL/min (ref >60)
Glucose, Bld: 198 mg/dL — ABNORMAL HIGH (ref 70–99)
Potassium: 4.5 mmol/L (ref 3.5–5.1)
Sodium: 134 mmol/L — ABNORMAL LOW (ref 135–145)

## 2021-01-13 LAB — GLUCOSE, CAPILLARY
Glucose-Capillary: 189 mg/dL — ABNORMAL HIGH (ref 70–99)
Glucose-Capillary: 187 mg/dL — ABNORMAL HIGH (ref 70–99)
Glucose-Capillary: 129 mg/dL — ABNORMAL HIGH (ref 70–99)
Glucose-Capillary: 202 mg/dL — ABNORMAL HIGH (ref 70–99)

## 2021-01-13 LAB — CBC
HCT: 28.7 % — ABNORMAL LOW (ref 39.0–52.0)
Hemoglobin: 8.7 g/dL — ABNORMAL LOW (ref 13.0–17.0)
MCH: 25.2 pg — ABNORMAL LOW (ref 26.0–34.0)
MCHC: 30.3 g/dL (ref 30.0–36.0)
MCV: 83.2 fL (ref 80.0–100.0)
Platelets: 376 10*3/uL (ref 150–400)
RBC: 3.45 MIL/uL — ABNORMAL LOW (ref 4.22–5.81)
RDW: 14.2 % (ref 11.5–15.5)
WBC: 9 10*3/uL (ref 4.0–10.5)
nRBC: 0 % (ref 0.0–0.2)

## 2021-01-13 MED ORDER — SULFAMETHOXAZOLE-TRIMETHOPRIM 800-160 MG PO TABS
1.0000 | ORAL_TABLET | Freq: Two times a day (BID) | ORAL | Status: DC
  Administered 2021-01-14: 1 via ORAL
  Filled 2021-01-13: qty 1

## 2021-01-13 MED ORDER — AMOXICILLIN-POT CLAVULANATE 875-125 MG PO TABS
1.0000 | ORAL_TABLET | Freq: Two times a day (BID) | ORAL | Status: DC
  Administered 2021-01-14 – 2021-01-15 (×3): 1 via ORAL
  Filled 2021-01-13 (×3): qty 1

## 2021-01-13 NOTE — Progress Notes (Signed)
PT Cancellation Note  Patient Details Name: Fernando Vega MRN: [id number] DOB: [birth year]   Cancelled Treatment:    Reason Eval/Treat Not Completed: Other (comment) PT orders received and pt chart reviewed. Pt supine in bed with RLE elevated, currently declining participation with PT as he has not gotten his breakfast yet. Requests that PT come back later. Will follow up with PT evaluation as appropriate. RN aware.  Herminio Commons, PT, DPT 8:55 AM,01/13/21

## 2021-01-13 NOTE — Progress Notes (Addendum)
PT Cancellation Note  Patient Details Name: Fernando Vega MRN: [id number] DOB: 07-04-1970   Cancelled Treatment:    Reason Eval/Treat Not Completed: Other (comment) PT orders received and pt chart reviewed. Pt seated in recliner with LLE elevated; pt had just worked with OT. Per conversation with OT, pt demonstrates deficits in safety awareness, but was able to transfer OOB to recliner with RW and maintenance of RLE NWB precautions. PT explained reason for visit, as pt stating that he doesn't need PT and is able to move fine. Pt continued to decline participation today, but was agreeable for PT to attempt evaluation tomorrow. Will follow up with PT evaluation tomorrow as appropriate. RN notified.   Herminio Commons, PT, DPT 10:31 AM,01/13/21

## 2021-01-13 NOTE — Progress Notes (Signed)
ID Pt doing fine No complaints Patient Vitals for the past 24 hrs:  BP Temp Temp src Pulse Resp SpO2  01/13/21 1954 125/73 98 F (36.7 C) -- 88 17 93 %  01/13/21 1640 (!) 147/81 97.9 F (36.6 C) -- 83 19 98 %  01/13/21 0735 116/73 98 F (36.7 C) Oral 80 16 98 %  01/13/21 0408 133/87 98.3 F (36.8 C) Oral 85 16 97 %  01/12/21 2302 127/70 98.9 F (37.2 C) -- 86 16 97 %   Afebrile Awake and alert Chest CTA Hs1s2 Abd soft Rt foot Surgical site well approximated Drain present       Labs CBC Latest Ref Rng & Units 01/13/2021 01/12/2021 01/10/2021  WBC 4.0 - 10.5 K/uL 9.0 8.0 9.2  Hemoglobin 13.0 - 17.0 g/dL 8.7(L) 9.0(L) 8.6(L)  Hematocrit 39.0 - 52.0 % 28.7(L) 27.9(L) 28.3(L)  Platelets 150 - 400 K/uL 376 340 329     CMP Latest Ref Rng & Units 01/13/2021 01/12/2021 01/11/2021  Glucose 70 - 99 mg/dL 198(H) 176(H) -  BUN 6 - 20 mg/dL 16 15 -  Creatinine 0.61 - 1.24 mg/dL 0.56(L) 0.70 0.64  Sodium 135 - 145 mmol/L 134(L) 137 -  Potassium 3.5 - 5.1 mmol/L 4.5 4.1 -  Chloride 98 - 111 mmol/L 100 102 -  CO2 22 - 32 mmol/L 28 29 -  Calcium 8.9 - 10.3 mg/dL 8.6(L) 8.7(L) -  Total Protein 6.5 - 8.1 g/dL - - -  Total Bilirubin 0.3 - 1.2 mg/dL - - -  Alkaline Phos 38 - 126 U/L - - -  AST 15 - 41 U/L - - -  ALT 0 - 44 U/L - - -     Micro Surgical cultures were sent to lab but were not processed as they were all taken for pathology I sent bloody fluid from JP drain- so far culture neg  Impression/recommendation ?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 Then he had acinetobacter and enterobacter and got cipro, was on cipro when he got admitted this time Currently on vanco and cefepime- cultures were sent as per podiatrist note but lab did not process it- there was a communication error and they were all sent to  pathology - sent the bloody fluid from the drain for culture and it is negative so far So ESBL organisms /pseudomonas and MRSA ruled out'  Pathology of 4 bone pieces show clear margin for osteomyelitis  Pt has already expressed his wish of not doing IV antibiotic at home-   We can do bactrim ( which will cover gram neg organisms except pseudomonas which is unlikely) and augmentin for strep and anerobes for 2-4 weeks depending on progress   DM on insulin   Anemia   Discussed the management with the patient and care team.

## 2021-01-13 NOTE — Progress Notes (Signed)
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 S/p debridement, lisfranc amputation and bone biopsy by podiatry Dr. Jacqualyn Posey on 01/08/2021. Blood cxs NGTD. Operative cultures/biopsy ordered but unfortunately does not look like this was sent. Continue on IV vanco, cefepime as per ID. Norco, morphine prn for pain. Nonweightbearing right lower extremity  DM2: HbA1c 11.8, poorly controlled. Continue on glargine, SSI w/ accuchecks    OSA: continue on CPAP qhs   Normocytic anemia: H&H are labile. Will transfuse if Hb < 7.0.     DVT prophylaxis: SCDs. Lovenox when ok w/ podiatry  Code Status: full  Family Communication:  Disposition Plan:  Level of care: Med-Surg  Status is: Inpatient  Remains inpatient appropriate because: severity of illness     Consultants:  Podiatry  ID  Procedures:   Antimicrobials: cefepime, vanco    Subjective: Pt c/o malasie  Objective: Vitals:   01/12/21 1900 01/12/21 2302 01/13/21 0408 01/13/21 0735  BP: (!) 151/83 127/70 133/87 116/73  Pulse: 86 86 85 80  Resp: 16 16 16 16   Temp: 98.1 F (36.7 C) 98.9 F (37.2 C) 98.3 F (36.8 C) 98 F (36.7 C)  TempSrc:   Oral Oral  SpO2: 93% 97% 97% 98%  Weight:      Height:        Intake/Output Summary (Last 24 hours) at 01/13/2021 0758 Last data filed at 01/13/2021 0516 Gross per 24 hour  Intake 1480 ml  Output 3155 ml  Net -1675 ml   Filed Weights   01/06/21 1704  Weight: 85.7 kg    Examination:  General exam: Appears comfortable  Respiratory system: clear breath sounds b/l   Cardiovascular system: S1/S2+. No rubs or clicks   Gastrointestinal system: Abd is soft, NT, ND & normal bowel sounds  Central nervous system: Alert and oriented. Moves all 3 extremities & right TMA stump  Psychiatry: Judgement and insight appear normal. Appropriate mood and affect     Data Reviewed: I have personally reviewed following labs and imaging studies  CBC: Recent Labs  Lab 01/06/21 1735 01/09/21 0424 01/10/21 0404 01/12/21 0717  WBC 8.0 10.6* 9.2 8.0  NEUTROABS 4.2  --   --   --   HGB 11.0* 9.0* 8.6* 9.0*  HCT 34.4* 29.4* 28.3* 27.9*  MCV 84.3 84.7 84.0 83.3  PLT 444* 356 329 778   Basic Metabolic Panel: Recent Labs  Lab 01/06/21 1735 01/09/21 0424 01/10/21 0404 01/11/21 0434 01/12/21 0717 01/13/21 0707  NA 137 136 135  --  137 134*  K 3.7 4.3 3.9  --  4.1 4.5  CL 100 103 101  --  102 100  CO2 27 27 28   --  29 28  GLUCOSE 281* 213* 157*  --  176* 198*  BUN 19 26* 16  --  15 16  CREATININE 0.81 0.91 0.65 0.64 0.70 0.56*  CALCIUM 8.9 8.3* 8.3*  --  8.7* 8.6*   GFR: Estimated Creatinine Clearance: 112.9 mL/min (A) (by C-G formula based on SCr of 0.56 mg/dL (L)). Liver Function Tests: No results for input(s):  AST, ALT, ALKPHOS, BILITOT, PROT, ALBUMIN in the last 168 hours. No results for input(s): LIPASE, AMYLASE in the last 168 hours. No results for input(s): AMMONIA in the last 168 hours. Coagulation Profile: No results for input(s): INR, PROTIME in the last 168 hours. Cardiac Enzymes: No results for input(s): CKTOTAL, CKMB, CKMBINDEX, TROPONINI in the last 168 hours. BNP (last 3 results) No results for input(s): PROBNP in the last 8760 hours. HbA1C: No results for input(s): HGBA1C in the last 72 hours. CBG: Recent Labs  Lab 01/11/21 2007 01/12/21 0743 01/12/21 1143 01/12/21 1614 01/12/21 2111  GLUCAP 185* 163* 162* 184* 187*   Lipid Profile: No results for input(s): CHOL, HDL, LDLCALC, TRIG, CHOLHDL, LDLDIRECT in the last 72  hours. Thyroid Function Tests: No results for input(s): TSH, T4TOTAL, FREET4, T3FREE, THYROIDAB in the last 72 hours. Anemia Panel: No results for input(s): VITAMINB12, FOLATE, FERRITIN, TIBC, IRON, RETICCTPCT in the last 72 hours. Sepsis Labs: Recent Labs  Lab 01/06/21 1735 01/07/21 0701  LATICACIDVEN 1.2 0.8    Recent Results (from the past 240 hour(s))  Culture, blood (routine x 2)     Status: None   Collection Time: 01/06/21  5:35 PM   Specimen: BLOOD  Result Value Ref Range Status   Specimen Description BLOOD LEFT ANTECUBITAL  Final   Special Requests   Final    BOTTLES DRAWN AEROBIC AND ANAEROBIC Blood Culture adequate volume   Culture   Final    NO GROWTH 5 DAYS Performed at [facility], [address] [place], [place] [address]    Report Status 01/11/2021 FINAL  Final  Resp Panel by RT-PCR (Flu A&B, Covid) Nasopharyngeal Swab     Status: None   Collection Time: 01/06/21  5:35 PM   Specimen: Nasopharyngeal Swab; Nasopharyngeal(NP) swabs in vial transport medium  Result Value Ref Range Status   SARS Coronavirus 2 by RT PCR NEGATIVE NEGATIVE Final    Comment: (NOTE) SARS-CoV-2 target nucleic acids are NOT DETECTED.  The SARS-CoV-2 RNA is generally detectable in upper respiratory specimens during the acute phase of infection. The lowest concentration of SARS-CoV-2 viral copies this assay can detect is 138 copies/mL. A negative result does not preclude SARS-Cov-2 infection and should not be used as the sole basis for treatment or other patient management decisions. A negative result may occur with  improper specimen collection/handling, submission of specimen other than nasopharyngeal swab, presence of viral mutation(s) within the areas targeted by this assay, and inadequate number of viral copies(<138 copies/mL). A negative result must be combined with clinical observations, patient history, and epidemiological information. The expected result is  Negative.  Fact Sheet for Patients:  EntrepreneurPulse.com.au  Fact Sheet for Healthcare Providers:  IncredibleEmployment.be  This test is no t yet approved or cleared by the [place] FDA and  has been authorized for detection and/or diagnosis of SARS-CoV-2 by FDA under an Emergency Use Authorization (EUA). This EUA will remain  in effect (meaning this test can be used) for the duration of the COVID-19 declaration under Section 564(b)(1) of the Act, 21 U.S.C.section 360bbb-3(b)(1), unless the authorization is terminated  or revoked sooner.       Influenza A by PCR NEGATIVE NEGATIVE Final   Influenza B by PCR NEGATIVE NEGATIVE Final    Comment: (NOTE) The Xpert Xpress SARS-CoV-2/FLU/RSV plus assay is intended as an aid in the diagnosis of influenza from Nasopharyngeal swab specimens and should not be used as a sole basis for treatment. Nasal washings and aspirates  are unacceptable for Xpert Xpress SARS-CoV-2/FLU/RSV testing.  Fact Sheet for Patients: EntrepreneurPulse.com.au  Fact Sheet for Healthcare Providers: IncredibleEmployment.be  This test is not yet approved or cleared by the [place] FDA and has been authorized for detection and/or diagnosis of SARS-CoV-2 by FDA under an Emergency Use Authorization (EUA). This EUA will remain in effect (meaning this test can be used) for the duration of the COVID-19 declaration under Section 564(b)(1) of the Act, 21 U.S.C. section 360bbb-3(b)(1), unless the authorization is terminated or revoked.  Performed at [facility], [address] [place], [place] [address]   Culture, blood (routine x 2)     Status: None   Collection Time: 01/06/21  5:41 PM   Specimen: BLOOD  Result Value Ref Range Status   Specimen Description BLOOD LEFT ANTECUBITAL  Final   Special Requests   Final    BOTTLES DRAWN AEROBIC AND ANAEROBIC Blood Culture adequate  volume   Culture   Final    NO GROWTH 5 DAYS Performed at [facility], [address], [place], [place] [address]    Report Status 01/11/2021 FINAL  Final  Surgical PCR screen     Status: None   Collection Time: 01/07/21  9:15 PM   Specimen: Nasal Mucosa; Nasal Swab  Result Value Ref Range Status   MRSA, PCR NEGATIVE NEGATIVE Final   Staphylococcus aureus NEGATIVE NEGATIVE Final    Comment: (NOTE) The Xpert SA Assay (FDA approved for NASAL specimens in patients 8 years of age and older), is one component of a comprehensive surveillance program. It is not intended to diagnose infection nor to guide or monitor treatment. Performed at [facility], [place], [place] [address]   Aerobic Culture w Gram Stain (superficial specimen)     Status: None (Preliminary result)   Collection Time: 01/11/21  5:23 PM   Specimen: JP Drain; Body Fluid  Result Value Ref Range Status   Specimen Description   Final    JP DRAINAGE Performed at [facility], [address] [place], [place] [address]    Special Requests   Final    RIGHT FOOT Performed at [facility], [address] [place], [place] [address]    Gram Stain   Final    RARE WBC PRESENT, PREDOMINANTLY MONONUCLEAR NO ORGANISMS SEEN    Culture   Final    NO GROWTH < 12 HOURS Performed at [facility] [address], [place], [place] [address]    Report Status PENDING  Incomplete         Radiology Studies: No results found.      Scheduled Meds:  insulin aspart  0-15 Units Subcutaneous TID WC   insulin aspart  0-5 Units Subcutaneous QHS   insulin glargine-yfgn  14 Units Subcutaneous Daily   senna-docusate  1 tablet Oral BID   Continuous Infusions:  sodium chloride Stopped (01/08/21 0304)   ceFEPime (MAXIPIME) IV 2 g (01/13/21 0046)   vancomycin 1,250 mg (01/13/21 0331)     LOS: 6 days    Time spent: 20 mins     Wyvonnia Dusky, MD [facility] Pager [phone]  If 7PM-7AM, please contact night-coverage 01/13/2021, 7:58 AM

## 2021-01-13 NOTE — Progress Notes (Signed)
   PODIATRY PROGRESS NOTE  NAME Fernando Vega MRN [id number] DOB 1970/06/17 DOA 01/06/2021   CC: Diabetic foot infection RT foot Chief Complaint  Patient presents with   Foot Pain   Wound Infection    History of present illness: 50 y.o. male s/p revisional amputation of the right foot secondary to osteomyelitis DOS: 01/08/2021.  Disarticulation at the TMT Lisfranc joint.  Bone biopsy.  Wound debridement.  Resting at bedside comfortably.  Patient is doing well.  Dressings have been clean dry and intact since last podiatry visit on 01/11/21. JP output minimal   Past Medical History:  Diagnosis Date   Diabetes mellitus without complication ([place])    Neuropathy    Sarcoidosis    Sleep apnea     CBC Latest Ref Rng & Units 01/13/2021 01/12/2021 01/10/2021  WBC 4.0 - 10.5 K/uL 9.0 8.0 9.2  Hemoglobin 13.0 - 17.0 g/dL 8.7(L) 9.0(L) 8.6(L)  Hematocrit 39.0 - 52.0 % 28.7(L) 27.9(L) 28.3(L)  Platelets 150 - 400 K/uL 376 340 329    BMP Latest Ref Rng & Units 01/13/2021 01/12/2021 01/11/2021  Glucose 70 - 99 mg/dL 198(H) 176(H) -  BUN 6 - 20 mg/dL 16 15 -  Creatinine 0.61 - 1.24 mg/dL 0.56(L) 0.70 0.64  Sodium 135 - 145 mmol/L 134(L) 137 -  Potassium 3.5 - 5.1 mmol/L 4.5 4.1 -  Chloride 98 - 111 mmol/L 100 102 -  CO2 22 - 32 mmol/L 28 29 -  Calcium 8.9 - 10.3 mg/dL 8.6(L) 8.7(L) -      Physical Exam: General: The patient is alert and oriented x3 in no acute distress.   Skin incision is well coapted with sutures and staples intact.  JP drain intact.  There appears to be good healing of the amputation site.  At the moment there is no concern for recurrence of infection or dehiscence along the amputation stump.  Surgical pathology 01/08/2021 RT foot: DIAGNOSIS:  A. TOES AND METATARSALS, RIGHT; AMPUTATION:  - ULCERATION AND ACUTE SUPPURATIVE INFLAMMATION.  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  - ACUTE INFLAMMATION EXTENDS TO INKED SOFT TISSUE MARGIN.  - VIABLE BONY MARGINS WITHOUT  ACUTE INFLAMMATION.    ASSESSMENT/PLAN OF CARE 1.  S/P Lisfranc amputation RT foot.  DOS: 01/08/2021 -Overall the amputation site looks healthy with good healing -Pathology is negative for bone infection however culture sent to microbiology were lost and therefore it is our recommendation to treat it as  residual osteomyelitis. -Await infectious disease recommendation -Prior to discharge the JP drain needs to be removed.  Podiatry will do this prior to discharge -Continue nonweightbearing surgical foot -Podiatry will continue to follow  Boneta Lucks, D.P.M.

## 2021-01-14 ENCOUNTER — Other Ambulatory Visit: Payer: Self-pay | Admitting: Infectious Diseases

## 2021-01-14 DIAGNOSIS — E11628 Type 2 diabetes mellitus with other skin complications: Secondary | ICD-10-CM

## 2021-01-14 LAB — BASIC METABOLIC PANEL
Anion gap: 7 (ref 5–15)
BUN: 17 mg/dL (ref 6–20)
CO2: 28 mmol/L (ref 22–32)
Calcium: 8.9 mg/dL (ref 8.9–10.3)
Chloride: 101 mmol/L (ref 98–111)
Creatinine, Ser: 0.59 mg/dL — ABNORMAL LOW (ref 0.61–1.24)
GFR, Estimated: >60 mL/min (ref >60)
Glucose, Bld: 152 mg/dL — ABNORMAL HIGH (ref 70–99)
Potassium: 4.1 mmol/L (ref 3.5–5.1)
Sodium: 136 mmol/L (ref 135–145)

## 2021-01-14 LAB — GLUCOSE, CAPILLARY
Glucose-Capillary: 199 mg/dL — ABNORMAL HIGH (ref 70–99)
Glucose-Capillary: 198 mg/dL — ABNORMAL HIGH (ref 70–99)
Glucose-Capillary: 200 mg/dL — ABNORMAL HIGH (ref 70–99)
Glucose-Capillary: 173 mg/dL — ABNORMAL HIGH (ref 70–99)

## 2021-01-14 LAB — CBC
HCT: 31.1 % — ABNORMAL LOW (ref 39.0–52.0)
Hemoglobin: 9.4 g/dL — ABNORMAL LOW (ref 13.0–17.0)
MCH: 25 pg — ABNORMAL LOW (ref 26.0–34.0)
MCHC: 30.2 g/dL (ref 30.0–36.0)
MCV: 82.7 fL (ref 80.0–100.0)
Platelets: 403 10*3/uL — ABNORMAL HIGH (ref 150–400)
RBC: 3.76 MIL/uL — ABNORMAL LOW (ref 4.22–5.81)
RDW: 14.3 % (ref 11.5–15.5)
WBC: 8 10*3/uL (ref 4.0–10.5)
nRBC: 0 % (ref 0.0–0.2)

## 2021-01-14 MED ORDER — SULFAMETHOXAZOLE-TRIMETHOPRIM 800-160 MG PO TABS
1.0000 | ORAL_TABLET | Freq: Three times a day (TID) | ORAL | Status: DC
  Administered 2021-01-14 – 2021-01-15 (×3): 1 via ORAL
  Filled 2021-01-14 (×3): qty 1

## 2021-01-14 NOTE — Progress Notes (Signed)
ID Pt doing fine No complaints Patient Vitals for the past 24 hrs:  BP Temp Temp src Pulse Resp SpO2  01/14/21 1535 121/77 97.9 F (36.6 C) Oral 85 16 99 %  01/14/21 1150 137/75 97.6 F (36.4 C) Oral 86 16 98 %  01/14/21 0750 130/80 98.2 F (36.8 C) Oral 88 16 97 %  01/14/21 0553 127/76 97.7 F (36.5 C) -- 84 17 97 %  01/13/21 2324 114/63 98.1 F (36.7 C) -- 85 17 94 %  01/13/21 1954 125/73 98 F (36.7 C) -- 88 17 93 %   Afebrile Awake and alert Chest CTA Hs1s2 Abd soft Rt foot Surgical site well approximated Drain present       Labs CBC Latest Ref Rng & Units 01/14/2021 01/13/2021 01/12/2021  WBC 4.0 - 10.5 K/uL 8.0 9.0 8.0  Hemoglobin 13.0 - 17.0 g/dL 9.4(L) 8.7(L) 9.0(L)  Hematocrit 39.0 - 52.0 % 31.1(L) 28.7(L) 27.9(L)  Platelets 150 - 400 K/uL 403(H) 376 340     CMP Latest Ref Rng & Units 01/14/2021 01/13/2021 01/12/2021  Glucose 70 - 99 mg/dL 152(H) 198(H) 176(H)  BUN 6 - 20 mg/dL 17 16 15   Creatinine 0.61 - 1.24 mg/dL 0.59(L) 0.56(L) 0.70  Sodium 135 - 145 mmol/L 136 134(L) 137  Potassium 3.5 - 5.1 mmol/L 4.1 4.5 4.1  Chloride 98 - 111 mmol/L 101 100 102  CO2 22 - 32 mmol/L 28 28 29   Calcium 8.9 - 10.3 mg/dL 8.9 8.6(L) 8.7(L)  Total Protein 6.5 - 8.1 g/dL - - -  Total Bilirubin 0.3 - 1.2 mg/dL - - -  Alkaline Phos 38 - 126 U/L - - -  AST 15 - 41 U/L - - -  ALT 0 - 44 U/L - - -     Micro Surgical cultures were sent to lab but were not processed as they were all taken for pathology I sent bloody fluid from JP drain- so far culture neg  Impression/recommendation ?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 Then he had acinetobacter and enterobacter and got cipro, was on cipro when he got admitted this time Currently on vanco and cefepime- cultures were sent as per podiatrist note but lab did  not process it- there was a communication error and they were all sent to pathology - sent the bloody fluid from the drain for culture and it is negative so far So ESBL organisms /pseudomonas and MRSA ruled out'  Pathology of 4 bone pieces show clear margin for osteomyelitis  Pt has already expressed his wish of not doing IV antibiotic at home-   He is currently on bactrim DS 1 TID and augmentin 875mg  PO BID until 02/01/21 Will need CMP/CBC after a week ( labs ordered- ask him to come to [facility] lab for blood draw on 01/24/21 He will follow up with me as OP in 2 weeks   DM on insulin   Anemia   Discussed the management with the patient and care team.  ID will follow peripherally this weekend

## 2021-01-14 NOTE — Progress Notes (Signed)
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 S/p debridement, lisfranc amputation and bone biopsy by podiatry Dr. Jacqualyn Posey on 01/08/2021. Blood cxs NGTD. Operative cultures ordered & accidentally sent to pathology.  Abxs changed to bactrim, augmentin as per ID.  JP will be removed prior to d/c as per podiatry. Norco, morphine prn for pain. Nonweightbearing right lower extremity  DM2: poorly controlled, HbA1c 11.8. Continue on glargine, SSI w/ accuchecks     OSA: continue on CPAP qhs   Normocytic anemia: H&H are labile. No need for a transfusion currently     DVT prophylaxis: SCDs. Lovenox when ok w/ podiatry  Code Status: full  Family Communication:  Disposition Plan:  Level of care: Med-Surg  Status is: Inpatient  Remains inpatient appropriate because: severity of illness. Stable to d/c home when cleared by podiatry      Consultants:  Podiatry  ID  Procedures:   Antimicrobials: bactrim, augmentin    Subjective: Pt c/o fatigue   Objective: Vitals:   01/13/21 1640 01/13/21 1954 01/13/21 2324 01/14/21 0553  BP: (!) 147/81 125/73 114/63 127/76  Pulse: 83 88 85 84  Resp: 19 17 17 17   Temp: 97.9 F (36.6 C) 98 F (36.7 C) 98.1 F (36.7 C) 97.7 F (36.5 C)  TempSrc:      SpO2: 98% 93% 94% 97%  Weight:      Height:        Intake/Output Summary (Last 24 hours) at 01/14/2021 0741 Last data filed at 01/14/2021 0052 Gross per 24 hour  Intake 720 ml  Output 680 ml  Net 40 ml   Filed Weights   01/06/21 1704  Weight: 85.7 kg    Examination:  General  exam: Appears calm & comfortable  Respiratory system: clear breath sounds b/l. No wheezes, rales Cardiovascular system: S1 & S2+. No clicks or rubs  Gastrointestinal system: Abd is soft, NT, ND & hypoactive bowel sounds  Central nervous system: Alert and oriented. Moves all 3 extremities & right TMA stump  Psychiatry: Judgement and insight appear normal. Flat mood and affect  Skin: right LE is dressed and dressing is bloody and partially saturated, JP drain in place draining sanguinous fluid     Data Reviewed: I have personally reviewed following labs and imaging studies  CBC: Recent Labs  Lab 01/09/21 0424 01/10/21 0404 01/12/21 0717 01/13/21 0707  WBC 10.6* 9.2 8.0 9.0  HGB 9.0* 8.6* 9.0* 8.7*  HCT 29.4* 28.3* 27.9* 28.7*  MCV 84.7 84.0 83.3 83.2  PLT 356 329 340 494   Basic Metabolic Panel: Recent Labs  Lab 01/09/21 0424 01/10/21 0404 01/11/21 0434 01/12/21 0717 01/13/21 0707  NA 136 135  --  137 134*  K 4.3 3.9  --  4.1 4.5  CL 103 101  --  102 100  CO2 27 28  --  29 28  GLUCOSE 213* 157*  --  176* 198*  BUN 26* 16  --  15 16  CREATININE 0.91 0.65 0.64 0.70 0.56*  CALCIUM 8.3* 8.3*  --  8.7* 8.6*   GFR: Estimated  Creatinine Clearance: 112.9 mL/min (A) (by C-G formula based on SCr of 0.56 mg/dL (L)). Liver Function Tests: No results for input(s): AST, ALT, ALKPHOS, BILITOT, PROT, ALBUMIN in the last 168 hours. No results for input(s): LIPASE, AMYLASE in the last 168 hours. No results for input(s): AMMONIA in the last 168 hours. Coagulation Profile: No results for input(s): INR, PROTIME in the last 168 hours. Cardiac Enzymes: No results for input(s): CKTOTAL, CKMB, CKMBINDEX, TROPONINI in the last 168 hours. BNP (last 3 results) No results for input(s): PROBNP in the last 8760 hours. HbA1C: No results for input(s): HGBA1C in the last 72 hours. CBG: Recent Labs  Lab 01/12/21 2111 01/13/21 0815 01/13/21 1159 01/13/21 1639 01/13/21 2039  GLUCAP 187* 189*  187* 129* 202*   Lipid Profile: No results for input(s): CHOL, HDL, LDLCALC, TRIG, CHOLHDL, LDLDIRECT in the last 72 hours. Thyroid Function Tests: No results for input(s): TSH, T4TOTAL, FREET4, T3FREE, THYROIDAB in the last 72 hours. Anemia Panel: No results for input(s): VITAMINB12, FOLATE, FERRITIN, TIBC, IRON, RETICCTPCT in the last 72 hours. Sepsis Labs: No results for input(s): PROCALCITON, LATICACIDVEN in the last 168 hours.   Recent Results (from the past 240 hour(s))  Culture, blood (routine x 2)     Status: None   Collection Time: 01/06/21  5:35 PM   Specimen: BLOOD  Result Value Ref Range Status   Specimen Description BLOOD LEFT ANTECUBITAL  Final   Special Requests   Final    BOTTLES DRAWN AEROBIC AND ANAEROBIC Blood Culture adequate volume   Culture   Final    NO GROWTH 5 DAYS Performed at [facility], [address] [place], [place] [address]    Report Status 01/11/2021 FINAL  Final  Resp Panel by RT-PCR (Flu A&B, Covid) Nasopharyngeal Swab     Status: None   Collection Time: 01/06/21  5:35 PM   Specimen: Nasopharyngeal Swab; Nasopharyngeal(NP) swabs in vial transport medium  Result Value Ref Range Status   SARS Coronavirus 2 by RT PCR NEGATIVE NEGATIVE Final    Comment: (NOTE) SARS-CoV-2 target nucleic acids are NOT DETECTED.  The SARS-CoV-2 RNA is generally detectable in upper respiratory specimens during the acute phase of infection. The lowest concentration of SARS-CoV-2 viral copies this assay can detect is 138 copies/mL. A negative result does not preclude SARS-Cov-2 infection and should not be used as the sole basis for treatment or other patient management decisions. A negative result may occur with  improper specimen collection/handling, submission of specimen other than nasopharyngeal swab, presence of viral mutation(s) within the areas targeted by this assay, and inadequate number of viral copies(<138 copies/mL). A negative result must be  combined with clinical observations, patient history, and epidemiological information. The expected result is Negative.  Fact Sheet for Patients:  EntrepreneurPulse.com.au  Fact Sheet for Healthcare Providers:  IncredibleEmployment.be  This test is no t yet approved or cleared by the [place] FDA and  has been authorized for detection and/or diagnosis of SARS-CoV-2 by FDA under an Emergency Use Authorization (EUA). This EUA will remain  in effect (meaning this test can be used) for the duration of the COVID-19 declaration under Section 564(b)(1) of the Act, 21 U.S.C.section 360bbb-3(b)(1), unless the authorization is terminated  or revoked sooner.       Influenza A by PCR NEGATIVE NEGATIVE Final   Influenza B by PCR NEGATIVE NEGATIVE Final    Comment: (NOTE) The Xpert Xpress SARS-CoV-2/FLU/RSV plus assay is intended as an aid in the diagnosis of  influenza from Nasopharyngeal swab specimens and should not be used as a sole basis for treatment. Nasal washings and aspirates are unacceptable for Xpert Xpress SARS-CoV-2/FLU/RSV testing.  Fact Sheet for Patients: EntrepreneurPulse.com.au  Fact Sheet for Healthcare Providers: IncredibleEmployment.be  This test is not yet approved or cleared by the [place] FDA and has been authorized for detection and/or diagnosis of SARS-CoV-2 by FDA under an Emergency Use Authorization (EUA). This EUA will remain in effect (meaning this test can be used) for the duration of the COVID-19 declaration under Section 564(b)(1) of the Act, 21 U.S.C. section 360bbb-3(b)(1), unless the authorization is terminated or revoked.  Performed at [facility], [address] [place], [place] [address]   Culture, blood (routine x 2)     Status: None   Collection Time: 01/06/21  5:41 PM   Specimen: BLOOD  Result Value Ref Range Status   Specimen Description BLOOD  LEFT ANTECUBITAL  Final   Special Requests   Final    BOTTLES DRAWN AEROBIC AND ANAEROBIC Blood Culture adequate volume   Culture   Final    NO GROWTH 5 DAYS Performed at [facility], [address] [place], [place] [address]    Report Status 01/11/2021 FINAL  Final  Surgical PCR screen     Status: None   Collection Time: 01/07/21  9:15 PM   Specimen: Nasal Mucosa; Nasal Swab  Result Value Ref Range Status   MRSA, PCR NEGATIVE NEGATIVE Final   Staphylococcus aureus NEGATIVE NEGATIVE Final    Comment: (NOTE) The Xpert SA Assay (FDA approved for NASAL specimens in patients 1 years of age and older), is one component of a comprehensive surveillance program. It is not intended to diagnose infection nor to guide or monitor treatment. Performed at [facility], [place], [place] [address]   Aerobic Culture w Gram Stain (superficial specimen)     Status: None (Preliminary result)   Collection Time: 01/11/21  5:23 PM   Specimen: JP Drain; Body Fluid  Result Value Ref Range Status   Specimen Description   Final    JP DRAINAGE Performed at [facility], [address], [place], [place] [address]    Special Requests   Final    RIGHT FOOT Performed at [facility], [address] [place], [place] [address]    Gram Stain   Final    RARE WBC PRESENT, PREDOMINANTLY MONONUCLEAR NO ORGANISMS SEEN    Culture   Final    NO GROWTH 2 DAYS Performed at [facility], [place] [address] [place],  [address]    Report Status PENDING  Incomplete         Radiology Studies: No results found.      Scheduled Meds:  amoxicillin-clavulanate  1 tablet Oral BID WC   insulin aspart  0-15 Units Subcutaneous TID WC   insulin aspart  0-5 Units Subcutaneous QHS   insulin glargine-yfgn  14 Units Subcutaneous Daily   senna-docusate  1 tablet Oral BID   sulfamethoxazole-trimethoprim  1 tablet Oral Q12H   Continuous Infusions:   sodium chloride Stopped (01/08/21 0304)     LOS: 7 days    Time spent: 15 mins     Wyvonnia Dusky, MD [facility] Pager [phone]  If 7PM-7AM, please contact night-coverage 01/14/2021, 7:41 AM

## 2021-01-14 NOTE — Progress Notes (Signed)
Patient's JP drain is hanging halfway out at the insertion site thus not allowing for negative suction. Spoke to Dr. Amalia Hailey with [facility] and he said for me to go ahead and clip the stitch and pull it out.

## 2021-01-14 NOTE — Progress Notes (Signed)
PT Cancellation Note  Patient Details Name: Fernando Vega MRN: [id number] DOB: 08/15/1970   Cancelled Treatment:    Reason Eval/Treat Not Completed: PT screened, no needs identified, will sign off (Patient familiar to author from prior admissions. Met at bedside. Pt endorses no change from recent baseline, AMB modI in room already, fluent in DME use, adherent inweightbearing recommendations. Pt denies any DME needs or rehab needs at DC.) Pt is mobilizing at baseline level with modified independence. PT signing off at this time.   9:32 AM, 01/14/21 Etta Grandchild, PT, DPT Physical Therapist - [facility]  [phone] ([place])     [place] [facility] 01/14/2021, 9:31 AM

## 2021-01-15 DIAGNOSIS — D649 Anemia, unspecified: Secondary | ICD-10-CM

## 2021-01-15 LAB — BASIC METABOLIC PANEL
Anion gap: 6 (ref 5–15)
BUN: 23 mg/dL — ABNORMAL HIGH (ref 6–20)
CO2: 29 mmol/L (ref 22–32)
Calcium: 8.7 mg/dL — ABNORMAL LOW (ref 8.9–10.3)
Chloride: 99 mmol/L (ref 98–111)
Creatinine, Ser: 0.79 mg/dL (ref 0.61–1.24)
GFR, Estimated: >60 mL/min (ref >60)
Glucose, Bld: 209 mg/dL — ABNORMAL HIGH (ref 70–99)
Potassium: 4.3 mmol/L (ref 3.5–5.1)
Sodium: 134 mmol/L — ABNORMAL LOW (ref 135–145)

## 2021-01-15 LAB — CBC
HCT: 31 % — ABNORMAL LOW (ref 39.0–52.0)
Hemoglobin: 9.4 g/dL — ABNORMAL LOW (ref 13.0–17.0)
MCH: 25.1 pg — ABNORMAL LOW (ref 26.0–34.0)
MCHC: 30.3 g/dL (ref 30.0–36.0)
MCV: 82.9 fL (ref 80.0–100.0)
Platelets: 390 10*3/uL (ref 150–400)
RBC: 3.74 MIL/uL — ABNORMAL LOW (ref 4.22–5.81)
RDW: 14.3 % (ref 11.5–15.5)
WBC: 8.9 10*3/uL (ref 4.0–10.5)
nRBC: 0 % (ref 0.0–0.2)

## 2021-01-15 LAB — GLUCOSE, CAPILLARY
Glucose-Capillary: 173 mg/dL — ABNORMAL HIGH (ref 70–99)
Glucose-Capillary: 143 mg/dL — ABNORMAL HIGH (ref 70–99)
Glucose-Capillary: 143 mg/dL — ABNORMAL HIGH (ref 70–99)

## 2021-01-15 LAB — AEROBIC CULTURE W GRAM STAIN (SUPERFICIAL SPECIMEN): Culture: NO GROWTH

## 2021-01-15 MED ORDER — SULFAMETHOXAZOLE-TRIMETHOPRIM 800-160 MG PO TABS: 1.0000 | ORAL_TABLET | Freq: Three times a day (TID) | ORAL | 0 refills | Status: DC

## 2021-01-15 MED ORDER — AMOXICILLIN-POT CLAVULANATE 875-125 MG PO TABS: 1.0000 | ORAL_TABLET | Freq: Two times a day (BID) | ORAL | 0 refills | Status: DC

## 2021-01-15 NOTE — Discharge Summary (Addendum)
Physician Discharge Summary  Fernando Vega UJW:[id number] DOB: Oct 25, 1970 DOA: 01/06/2021  PCP: Langston Reusing, NP  Admit date: 01/06/2021 Discharge date: 01/15/2021  Admitted From: home  Disposition:  home   Recommendations for Outpatient Follow-up:  Follow up with PCP in 1-2 weeks F/u w/ podiatry, Dr. Amalia Hailey, w/in 1 week F/u w/ ID, Dr. Delaine Lame, in 2 weeks Get labs (CBC/CMP) done at [facility] on 01/24/21 as per ID, Dr. Delaine Lame  Home Health: no  Equipment/Devices:  Discharge Condition: stable  CODE STATUS: full  Diet recommendation: Carb Modified  Brief/Interim Summary: HPI was taken from Dr. Damita Dunnings: Fernando Vega is a 50 y.o. male with medical history significant for DM, HTN and OSA on CPAP, who is s/p right TMA on 56/2 complicated by wound dehiscence who was sent in by podiatry with concern for stump infection and osteomyelitis after seeing exposed bone on wound check at the office.  Patient has moderate intensity throbbing pain at the stump . He denies fever or chills. He is otherwise in his usual state of health   ED course: On arrival, afebrile and vitals within normal limits WBC normal at 8000 with lactic acid 1.2.  Hemoglobin 11 which is about his baseline   MRI right foot showing findings suggestive of acute osteomyelitis and soft tissue enhancement concerning for tissue necrosis.  No organized or drainable fluid collection   Patient started on Maxipime and Cipro and hospitalist consulted for admission.  As per Dr. [place] Fernando Vega is a 50 year old male with past medical history significant for DM2, essential hypertension, OSA on CPAP, with history of diabetic foot wound/osteomyelitis with recent transmetatarsal amputation on 10/3 by podiatry who presents to [facility] ED by discretion of his podiatrist for wound dehiscence, cellulitis and bone exposure.  Patient reported moderate intensity pain and throbbing at his stump site.  Denies fever/chills.  No other  complaints at the time.   In the ED, temperature 98.3 F, HR 89, RR 16, BP 136/77, SPO2 98% on room air.  Sodium 137, potassium 3.7, chloride 100, CO2 27, BUN 19, creatinine 0.1, glucose 281.  Lactic acid 1.2, WBC count 8.0, hemoglobin 11.0, platelets 444.  Fernando Vega.  Influenza A/B PCR Vega.  MR right foot with and without contrast with findings suggestive of acute osteomyelitis to residual first/fourth/fifth metatarsals, soft tissue wound ulceration concerning for tissue necrosis, no abscess/fluid collection identified.  EDP consulted hospitalist service for further evaluation and treatment of acute osteomyelitis right foot/stump.  As per Dr. Jimmye Norman 11/2-11/4/22: Fernando Vega was still on IV abxs, vanco and cefepime when I started seeing the Fernando Vega. Abxs were changed to po bactrim & augmentin until 02/01/21 as per ID. Fernando Vega will not need any wound dressing changes at home but will f/u outpatient w/ podiatry and ID. Fernando Vega verbalized his understanding. 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 S/p debridement, lisfranc amputation and bone biopsy by podiatry Dr. Jacqualyn Posey on 01/08/2021. Blood cxs NGTD. Operative cultures ordered & accidentally sent to pathology.  Abxs changed to bactrim, augmentin as per ID. JP drain was removed. Norco prn for pain. Nonweightbearing right lower extremity  DM2: poorly controlled,  HbA1c 11.8. Continue on glargine, SSI w/ accuchecks     OSA: continue on CPAP qhs   Normocytic anemia: H&H are labile. No need for a transfusion currently   Discharge Instructions  Discharge Instructions     Diet - low sodium  heart healthy   Complete by: As directed    Diet Carb Modified   Complete by: As directed    Discharge instructions   Complete by: As directed    F/u w/ podiatry, Dr. Amalia Hailey, w/in 1 week. No dressings changes of right foot, keep present dressing clean, dry & intact. F/u w/ ID, Dr. Delaine Lame, in 2 weeks. Will need to get labs (CBC/CMP) at [facility] on 01/24/21 as per ID, Dr. Delaine Lame. F/u w/ PCP in 1-2 weeks   Increase activity slowly   Complete by: As directed    No wound care   Complete by: As directed       Allergies as of 01/15/2021   No Known Allergies      Medication List     STOP taking these medications    ciprofloxacin 500 MG tablet Commonly known as: CIPRO   doxycycline 100 MG tablet Commonly known as: VIBRA-TABS       TAKE these medications    acetaminophen 500 MG tablet Commonly known as: TYLENOL Take 1 tablet (500 mg total) by mouth every 6 (six) hours as needed for mild pain (or Fever >/= 101).   albuterol 108 (90 Base) MCG/ACT inhaler Commonly known as: VENTOLIN HFA Inhale 2 puffs into the lungs once every 6 (six) hours as needed for wheezing or shortness of breath.   amLODipine 10 MG tablet Commonly known as: NORVASC Take 1 tablet (10 mg total) by mouth once daily.   amoxicillin-clavulanate 875-125 MG tablet Commonly known as: AUGMENTIN Take 1 tablet by mouth 2 (two) times daily with a meal for 17 days.   vitamin C with rose hips 500 MG tablet Take 500 mg by mouth daily.   ascorbic acid 250 MG tablet Commonly known as: VITAMIN C Take 1 tablet (250 mg total) by mouth once daily.   Basaglar KwikPen 100 UNIT/ML Inject 15 Units into the skin at bedtime.   gabapentin 300 MG capsule Commonly known as: NEURONTIN Take 1 capsule (300 mg total) by mouth once daily at bedtime.   insulin lispro 100 UNIT/ML KwikPen Commonly known as: HumaLOG KwikPen Inject 10 Units into the skin 3 (three) times daily before meals. BG 201-250 Add 1 unit =11 units; BG  251-300 Add 2 units =12 units BG 301- 350 Add 3 units =13 units BG 351-400 Add 4 units =14 units If over 400 units add 5 units, recheck in 2 hours if still over 400 go to the hospital.   Insulin Pen Needle 32G X 6 MM Misc USE AS DIRECTED WITH INSULIN PENS.   multivitamin with minerals Tabs tablet Take 1 tablet by mouth once daily.   pantoprazole 40 MG tablet Commonly known as: PROTONIX Take 1 tablet (40 mg total) by mouth once daily.   sulfamethoxazole-trimethoprim 800-160 MG tablet Commonly known as: BACTRIM DS Take 1 tablet by mouth 3 (three) times daily for 17 days.   traMADol 50 MG tablet Commonly known as: ULTRAM Take 1 tablet (50 mg total) by mouth every 6 (six) hours as needed for moderate pain.        No Known Allergies  Consultations: Podiatry  ID    Procedures/Studies: MR FOOT RIGHT W WO CONTRAST  Result Date: 01/06/2021 CLINICAL DATA:  History of transmetatarsal amputation of the right foot. Now with exposed bone and concern for infection EXAM: MRI OF THE RIGHT FOREFOOT WITHOUT AND WITH CONTRAST TECHNIQUE: Multiplanar, multisequence MR imaging of the right foot was performed before and after the administration of intravenous contrast. CONTRAST:  7.18mL GADAVIST GADOBUTROL 1 MMOL/ML IV SOLN COMPARISON:  X-ray 12/14/2020 FINDINGS: Bones/Joint/Cartilage Postsurgical changes from prior right foot amputation including transmetatarsal amputation of the first, fourth, and fifth metatarsals and complete second and third ray resections to the level of the TMT joints. Bone marrow edema is present throughout the residual first, fourth, and fifth metatarsals with enhancement. Confluent low T1 signal changes involving the base of the first metatarsal. Bone marrow edema and enhancement involving the medial, intermediate, and lateral cuneiform bones with probable erosions involving the intermediate and lateral cuneiform as well as its patchy areas of decreased T1 bone marrow signal.  Bone marrow edema is also present within the cuboid and navicular bones of the midfoot with areas of low T1 marrow signal changes including along the lateral margin of the cuboid and medial margin of the navicular. Marrow signal within the visualized calcaneus, talus, distal tibia, and distal fibula are preserved. No acute fracture or dislocation. Ligaments No evidence of acute ligamentous disruption within the midfoot. Muscles and Tendons Post amputation changes of the flexor and extensor tendons of the foot. No tenosynovitis. Diffuse edema-like signal within the foot musculature, which may represent a combination of denervation changes and myositis. Soft tissues Soft tissue wound or ulceration along the dorsal margin of the stump. Irregular areas of soft tissue hypoenhancement along the distal stump concerning for tissue necrosis. No organized or drainable fluid collection. IMPRESSION: 1. Postsurgical changes from prior right foot amputation. 2. Bone marrow signal changes involving portions of the residual first, fourth, and fifth metatarsals as well as each of the cuneiform bones and also the cuboid and navicular are suggestive of acute osteomyelitis. 3. Soft tissue wound or ulceration along the dorsal margin of the stump. Irregular areas of soft tissue hypoenhancement along the distal stump concerning for tissue necrosis. No organized or drainable fluid collection. Electronically Signed   By: Davina Poke D.O.   On: 01/06/2021 19:01   DG Foot Complete Right  Result Date: 01/08/2021 CLINICAL DATA:  Status post right forefoot amputation EXAM: RIGHT FOOT COMPLETE - 3+ VIEW COMPARISON:  None. FINDINGS: Surgical changes of right forefoot amputation. The tarsal bones remain present, but the metatarsals and phalanges have been removed. A surgical drain is present. Staple line also visualized. No significant subcutaneous gas or evidence of osteomyelitis. IMPRESSION: Surgical changes of forefoot amputation  without evidence of complication. Electronically Signed   By: Jacqulynn Cadet M.D.   On: 01/08/2021 12:21   (Echo, Carotid, EGD, Colonoscopy, ERCP)    Subjective: Fernando Vega c/o fatigue    Discharge Exam: Vitals:   01/15/21 0816 01/15/21 1121  BP: 136/85 127/74  Pulse: 86 87  Resp: 16 16  Temp: 98 F (36.7 C) 98 F (36.7 C)  SpO2: 96% 98%   Vitals:   01/14/21 1957 01/15/21 0412 01/15/21 0816 01/15/21 1121  BP: (!) 151/87 126/73 136/85 127/74  Pulse: 86 81 86 87  Resp: 17 17 16 16   Temp: 98.2 F (36.8 C) 98 F (36.7 C) 98 F (36.7 C) 98 F (36.7 C)  TempSrc:      SpO2: 98% 97% 96% 98%  Weight:      Height:        General: Fernando Vega is alert,  awake, not in acute distress Cardiovascular: S1/S2 +, no rubs, no gallops Respiratory: CTA bilaterally, no wheezing, no rhonchi Abdominal: Soft, NT, ND, bowel sounds + Extremities: no cyanosis, RLE is dressed and dressing is C/D/I     The results of significant diagnostics from this hospitalization (including imaging, microbiology, ancillary and laboratory) are listed below for reference.     Microbiology: Recent Results (from the past 240 hour(s))  Culture, blood (routine x 2)     Status: None   Collection Time: 01/06/21  5:35 PM   Specimen: BLOOD  Result Value Ref Range Status   Specimen Description BLOOD LEFT ANTECUBITAL  Final   Special Requests   Final    BOTTLES DRAWN AEROBIC AND ANAEROBIC Blood Culture adequate volume   Culture   Final    NO GROWTH 5 DAYS Performed at [facility], [address] [place], [place] [address]    Report Status 01/11/2021 FINAL  Final  Resp Panel by RT-PCR (Flu A&B, Covid) Nasopharyngeal Swab     Status: None   Collection Time: 01/06/21  5:35 PM   Specimen: Nasopharyngeal Swab; Nasopharyngeal(NP) swabs in vial transport medium  Result Value Ref Range Status   SARS Coronavirus 2 by RT PCR Vega Vega Final    Comment: (NOTE) SARS-CoV-2 target nucleic acids are NOT  DETECTED.  The SARS-CoV-2 RNA is generally detectable in upper respiratory specimens during the acute phase of infection. The lowest concentration of SARS-CoV-2 viral copies this assay can detect is 138 copies/mL. A Vega result does not preclude SARS-Cov-2 infection and should not be used as the sole basis for treatment or other patient management decisions. A Vega result may occur with  improper specimen collection/handling, submission of specimen other than nasopharyngeal swab, presence of viral mutation(s) within the areas targeted by this assay, and inadequate number of viral copies(<138 copies/mL). A Vega result must be combined with clinical observations, patient history, and epidemiological information. The expected result is Vega.  Fact Sheet for Patients:  EntrepreneurPulse.com.au  Fact Sheet for Healthcare Providers:  IncredibleEmployment.be  This test is no t yet approved or cleared by the [place] FDA and  has been authorized for detection and/or diagnosis of SARS-CoV-2 by FDA under an Emergency Use Authorization (EUA). This EUA will remain  in effect (meaning this test can be used) for the duration of the COVID-19 declaration under Section 564(b)(1) of the Act, 21 U.S.C.section 360bbb-3(b)(1), unless the authorization is terminated  or revoked sooner.       Influenza A by PCR Vega Vega Final   Influenza B by PCR Vega Vega Final    Comment: (NOTE) The Xpert Xpress SARS-CoV-2/FLU/RSV plus assay is intended as an aid in the diagnosis of influenza from Nasopharyngeal swab specimens and should not be used as a sole basis for treatment. Nasal washings and aspirates are unacceptable for Xpert Xpress SARS-CoV-2/FLU/RSV testing.  Fact Sheet for Patients: EntrepreneurPulse.com.au  Fact Sheet for Healthcare Providers: IncredibleEmployment.be  This test is not yet  approved or cleared by the [place] FDA and has been authorized for detection and/or diagnosis of SARS-CoV-2 by FDA under an Emergency Use Authorization (EUA). This EUA will remain in effect (meaning this test can be used) for the duration of the COVID-19 declaration under Section 564(b)(1) of the Act, 21 U.S.C. section 360bbb-3(b)(1), unless the authorization is terminated or revoked.  Performed at [facility], [address] [place], [place] [address]   Culture, blood (routine x 2)     Status:  None   Collection Time: 01/06/21  5:41 PM   Specimen: BLOOD  Result Value Ref Range Status   Specimen Description BLOOD LEFT ANTECUBITAL  Final   Special Requests   Final    BOTTLES DRAWN AEROBIC AND ANAEROBIC Blood Culture adequate volume   Culture   Final    NO GROWTH 5 DAYS Performed at [facility], [address] [place], [place] [address]    Report Status 01/11/2021 FINAL  Final  Surgical PCR screen     Status: None   Collection Time: 01/07/21  9:15 PM   Specimen: Nasal Mucosa; Nasal Swab  Result Value Ref Range Status   MRSA, PCR Vega Vega Final   Staphylococcus aureus Vega Vega Final    Comment: (NOTE) The Xpert SA Assay (FDA approved for NASAL specimens in patients 1 years of age and older), is one component of a comprehensive surveillance program. It is not intended to diagnose infection nor to guide or monitor treatment. Performed at [facility], [place], [place] [address]   Aerobic Culture w Gram Stain (superficial specimen)     Status: None   Collection Time: 01/11/21  5:23 PM   Specimen: JP Drain; Body Fluid  Result Value Ref Range Status   Specimen Description   Final    JP DRAINAGE Performed at [facility], [address] [place], [place] [address]    Special Requests   Final    RIGHT FOOT Performed at [facility], [address] [place], [place] [address]    Gram  Stain   Final    RARE WBC PRESENT, PREDOMINANTLY MONONUCLEAR NO ORGANISMS SEEN    Culture   Final    NO GROWTH 2 DAYS Performed at [facility], [place] [address], [place], [place] [address]    Report Status 01/15/2021 FINAL  Final     Labs: BNP (last 3 results) No results for input(s): BNP in the last 8760 hours. Basic Metabolic Panel: Recent Labs  Lab 01/10/21 0404 01/11/21 0434 01/12/21 0717 01/13/21 0707 01/14/21 0652 01/15/21 0355  NA 135  --  137 134* 136 134*  K 3.9  --  4.1 4.5 4.1 4.3  CL 101  --  102 100 101 99  CO2 28  --  29 28 28 29   GLUCOSE 157*  --  176* 198* 152* 209*  BUN 16  --  15 16 17  23*  CREATININE 0.65 0.64 0.70 0.56* 0.59* 0.79  CALCIUM 8.3*  --  8.7* 8.6* 8.9 8.7*   Liver Function Tests: No results for input(s): AST, ALT, ALKPHOS, BILITOT, PROT, ALBUMIN in the last 168 hours. No results for input(s): LIPASE, AMYLASE in the last 168 hours. No results for input(s): AMMONIA in the last 168 hours. CBC: Recent Labs  Lab 01/10/21 0404 01/12/21 0717 01/13/21 0707 01/14/21 0652 01/15/21 0355  WBC 9.2 8.0 9.0 8.0 8.9  HGB 8.6* 9.0* 8.7* 9.4* 9.4*  HCT 28.3* 27.9* 28.7* 31.1* 31.0*  MCV 84.0 83.3 83.2 82.7 82.9  PLT 329 340 376 403* 390   Cardiac Enzymes: No results for input(s): CKTOTAL, CKMB, CKMBINDEX, TROPONINI in the last 168 hours. BNP: Invalid input(s): POCBNP CBG: Recent Labs  Lab 01/14/21 1150 01/14/21 1628 01/14/21 1959 01/15/21 0834 01/15/21 1156  GLUCAP 198* 200* 173* 173* 143*   D-Dimer No results for input(s): DDIMER in the last 72 hours. Hgb A1c No results for input(s): HGBA1C in the last 72 hours. Lipid Profile No  results for input(s): CHOL, HDL, LDLCALC, TRIG, CHOLHDL, LDLDIRECT in the last 72 hours. Thyroid function studies No results for input(s): TSH, T4TOTAL, T3FREE, THYROIDAB in the last 72 hours.  Invalid input(s): FREET3 Anemia work up No results for input(s): VITAMINB12, FOLATE, FERRITIN, TIBC,  IRON, RETICCTPCT in the last 72 hours. Urinalysis    Component Value Date/Time   COLORURINE YELLOW 11/01/2020 1026   APPEARANCEUR CLEAR 11/01/2020 1026   LABSPEC 1.025 11/01/2020 1026   PHURINE 5.5 11/01/2020 1026   GLUCOSEU 500 (A) 11/01/2020 1026   HGBUR TRACE (A) 11/01/2020 1026   BILIRUBINUR Vega 11/01/2020 1026   KETONESUR 80 (A) 11/01/2020 1026   PROTEINUR Vega 11/01/2020 1026   NITRITE Vega 11/01/2020 1026   LEUKOCYTESUR Vega 11/01/2020 1026   Sepsis Labs Invalid input(s): PROCALCITONIN,  WBC,  LACTICIDVEN Microbiology Recent Results (from the past 240 hour(s))  Culture, blood (routine x 2)     Status: None   Collection Time: 01/06/21  5:35 PM   Specimen: BLOOD  Result Value Ref Range Status   Specimen Description BLOOD LEFT ANTECUBITAL  Final   Special Requests   Final    BOTTLES DRAWN AEROBIC AND ANAEROBIC Blood Culture adequate volume   Culture   Final    NO GROWTH 5 DAYS Performed at [facility], [address] [place], [place] [address]    Report Status 01/11/2021 FINAL  Final  Resp Panel by RT-PCR (Flu A&B, Covid) Nasopharyngeal Swab     Status: None   Collection Time: 01/06/21  5:35 PM   Specimen: Nasopharyngeal Swab; Nasopharyngeal(NP) swabs in vial transport medium  Result Value Ref Range Status   SARS Coronavirus 2 by RT PCR Vega Vega Final    Comment: (NOTE) SARS-CoV-2 target nucleic acids are NOT DETECTED.  The SARS-CoV-2 RNA is generally detectable in upper respiratory specimens during the acute phase of infection. The lowest concentration of SARS-CoV-2 viral copies this assay can detect is 138 copies/mL. A Vega result does not preclude SARS-Cov-2 infection and should not be used as the sole basis for treatment or other patient management decisions. A Vega result may occur with  improper specimen collection/handling, submission of specimen other than nasopharyngeal swab, presence of viral mutation(s) within  the areas targeted by this assay, and inadequate number of viral copies(<138 copies/mL). A Vega result must be combined with clinical observations, patient history, and epidemiological information. The expected result is Vega.  Fact Sheet for Patients:  EntrepreneurPulse.com.au  Fact Sheet for Healthcare Providers:  IncredibleEmployment.be  This test is no t yet approved or cleared by the [place] FDA and  has been authorized for detection and/or diagnosis of SARS-CoV-2 by FDA under an Emergency Use Authorization (EUA). This EUA will remain  in effect (meaning this test can be used) for the duration of the COVID-19 declaration under Section 564(b)(1) of the Act, 21 U.S.C.section 360bbb-3(b)(1), unless the authorization is terminated  or revoked sooner.       Influenza A by PCR Vega Vega Final   Influenza B by PCR Vega Vega Final    Comment: (NOTE) The Xpert Xpress SARS-CoV-2/FLU/RSV plus assay is intended as an aid in the diagnosis of influenza from Nasopharyngeal swab specimens and should not be used as a sole basis for treatment. Nasal washings and aspirates are unacceptable for Xpert Xpress SARS-CoV-2/FLU/RSV testing.  Fact Sheet for Patients: EntrepreneurPulse.com.au  Fact Sheet for Healthcare Providers: IncredibleEmployment.be  This test is not yet approved or cleared by the [place] FDA and has been  authorized for detection and/or diagnosis of SARS-CoV-2 by FDA under an Emergency Use Authorization (EUA). This EUA will remain in effect (meaning this test can be used) for the duration of the COVID-19 declaration under Section 564(b)(1) of the Act, 21 U.S.C. section 360bbb-3(b)(1), unless the authorization is terminated or revoked.  Performed at [facility], [address] [place], [place] [address]   Culture, blood (routine x 2)     Status: None    Collection Time: 01/06/21  5:41 PM   Specimen: BLOOD  Result Value Ref Range Status   Specimen Description BLOOD LEFT ANTECUBITAL  Final   Special Requests   Final    BOTTLES DRAWN AEROBIC AND ANAEROBIC Blood Culture adequate volume   Culture   Final    NO GROWTH 5 DAYS Performed at [facility], [address], [place], [place] [address]    Report Status 01/11/2021 FINAL  Final  Surgical PCR screen     Status: None   Collection Time: 01/07/21  9:15 PM   Specimen: Nasal Mucosa; Nasal Swab  Result Value Ref Range Status   MRSA, PCR Vega Vega Final   Staphylococcus aureus Vega Vega Final    Comment: (NOTE) The Xpert SA Assay (FDA approved for NASAL specimens in patients 57 years of age and older), is one component of a comprehensive surveillance program. It is not intended to diagnose infection nor to guide or monitor treatment. Performed at [facility], [place], [place] [address]   Aerobic Culture w Gram Stain (superficial specimen)     Status: None   Collection Time: 01/11/21  5:23 PM   Specimen: JP Drain; Body Fluid  Result Value Ref Range Status   Specimen Description   Final    JP DRAINAGE Performed at [facility], [address] [place], [place] [address]    Special Requests   Final    RIGHT FOOT Performed at [facility], [address] [place], [place] [address]    Gram Stain   Final    RARE WBC PRESENT, PREDOMINANTLY MONONUCLEAR NO ORGANISMS SEEN    Culture   Final    NO GROWTH 2 DAYS Performed at [facility], [place] [address], [place], [place] [address]    Report Status 01/15/2021 FINAL  Final     Time coordinating discharge: Over 30 minutes  SIGNED:   Wyvonnia Dusky, MD  [facility] 01/15/2021, 2:48 PM Pager   If 7PM-7AM, please contact night-coverage

## 2021-01-17 ENCOUNTER — Telehealth: Payer: Self-pay | Admitting: Podiatry

## 2021-01-17 ENCOUNTER — Other Ambulatory Visit: Payer: Self-pay | Admitting: Podiatry

## 2021-01-17 MED ORDER — TRAMADOL HCL 50 MG PO TABS
50.0000 mg | ORAL_TABLET | Freq: Four times a day (QID) | ORAL | 0 refills | Status: DC | PRN
Start: 2021-01-17 — End: 2021-06-21

## 2021-01-17 NOTE — Telephone Encounter (Signed)
Rx Tramadol sent to the pharmacy. Please contact patient and let him know. - Dr. Amalia Hailey

## 2021-01-17 NOTE — Telephone Encounter (Signed)
Patient called and stated he wanted to make an appt for follow up his surgery. Patient staes Dr Amalia Hailey did his surgery. Patient asked for some pain medication because he never g=received any at discharge. Sent message to Dr Amalia Hailey he sent over RX looks like RX was discontinued. After looking in his chart looks like Dr Posey Pronto might have done surgery. Patient would like them sent to [facility].

## 2021-01-17 NOTE — Telephone Encounter (Signed)
Patient called to schedule a follow up from hospital. Scheduled pt next Tuesday. Patient states he is taking Tylenol and its not touching his pain. Patient wanted to see if Dr Amalia Hailey would call something else in.Patients number is [phone].

## 2021-01-17 NOTE — Telephone Encounter (Signed)
Called pt let him know RX was called over. Patient very appreciative!

## 2021-01-18 MED ORDER — OXYCODONE-ACETAMINOPHEN 5-325 MG PO TABS: 1.0000 | ORAL_TABLET | ORAL | 0 refills | Status: DC | PRN

## 2021-01-18 NOTE — Telephone Encounter (Signed)
error 

## 2021-01-21 ENCOUNTER — Telehealth: Payer: Self-pay | Admitting: Pharmacy Technician

## 2021-01-21 NOTE — Telephone Encounter (Signed)
Attempted to return phone call to patient.  Unable to reach.  Received message stating that mailbox is full unable to leave message.  Patient called again and spoke with Sinclair Ship. Told Adam that I did not return call.  I called patient and explained that I had attempted to call and received a message that mailbox was full.  Patient stated that he does not know how to clear messages from his mailbox.  Also, stated that he did not recognize my phone number so he did not answer the call. Patient wanting a copy of [employer] letter.  Coming by [facility] to pick-up.  [facility]

## 2021-01-25 ENCOUNTER — Other Ambulatory Visit: Payer: Self-pay

## 2021-01-25 ENCOUNTER — Encounter (INDEPENDENT_AMBULATORY_CARE_PROVIDER_SITE_OTHER): Payer: Self-pay

## 2021-01-25 ENCOUNTER — Ambulatory Visit (INDEPENDENT_AMBULATORY_CARE_PROVIDER_SITE_OTHER): Payer: Self-pay | Admitting: Podiatry

## 2021-01-25 DIAGNOSIS — Z9889 Other specified postprocedural states: Secondary | ICD-10-CM

## 2021-01-25 NOTE — Progress Notes (Signed)
   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 DOS: 01/08/2021.  Patient states that he feels well.  He is kept the dressings clean and dry and intact.  He has sustained some fall injuries with the knee scooter.  He is left the cam boot on the surgical extremity.  He presents for outpatient follow-up and care  Past Medical History:  Diagnosis Date   Diabetes mellitus without complication ([place])    Neuropathy    Sarcoidosis    Sleep apnea       Objective/Physical Exam Neurovascular status intact.  Skin incisions appear to be well coapted with sutures and staples intact. No sign of infectious process noted. No dehiscence. No active bleeding noted. Moderate edema noted to the surgical extremity.   Assessment: 1. s/p Lisfranc amputation right foot. DOS: 01/08/2021   Plan of Care:  1. Patient was evaluated. 2.  Dressings changed.  Clean dry and intact x1 week 3.  Patient may begin weightbearing in the cam boot as tolerated 4.  Return to clinic in 2 weeks for suture removal and follow-up x-rays 5.  As a courtesy to the patient mechanical debridement of the nails 1-5 of the left foot was performed using a nail nipper without incident or bleeding   Edrick Kins, DPM [facility]  Dr. Edrick Kins, DPM    [address], [place] [address]                Office [phone]  Fax [phone]

## 2021-01-27 ENCOUNTER — Other Ambulatory Visit: Payer: Self-pay

## 2021-01-27 ENCOUNTER — Encounter: Payer: Self-pay | Admitting: Infectious Diseases

## 2021-01-27 ENCOUNTER — Ambulatory Visit: Payer: Self-pay | Admitting: Gerontology

## 2021-01-27 ENCOUNTER — Other Ambulatory Visit
Admission: RE | Admit: 2021-01-27 | Discharge: 2021-01-27 | Disposition: A | Discharge status: Home / Self Care | Payer: Medicaid Other | Source: Ambulatory Visit | Attending: Infectious Diseases | Admitting: Infectious Diseases

## 2021-01-27 ENCOUNTER — Ambulatory Visit: Payer: Medicaid Other | Attending: Infectious Diseases | Admitting: Infectious Diseases

## 2021-01-27 ENCOUNTER — Encounter: Payer: Self-pay | Admitting: Gerontology

## 2021-01-27 VITALS — BP 135/80 | HR 91 | Temp 97.4°F | Resp 16 | Ht 66.0 in | Wt 199.5 lb

## 2021-01-27 VITALS — BP 145/80 | HR 93 | Temp 98.2°F | Resp 16 | Ht 67.0 in | Wt 189.0 lb

## 2021-01-27 DIAGNOSIS — D649 Anemia, unspecified: Secondary | ICD-10-CM | POA: Diagnosis not present

## 2021-01-27 DIAGNOSIS — E1142 Type 2 diabetes mellitus with diabetic polyneuropathy: Secondary | ICD-10-CM

## 2021-01-27 DIAGNOSIS — E1169 Type 2 diabetes mellitus with other specified complication: Secondary | ICD-10-CM | POA: Insufficient documentation

## 2021-01-27 DIAGNOSIS — L7682 Other postprocedural complications of skin and subcutaneous tissue: Secondary | ICD-10-CM | POA: Insufficient documentation

## 2021-01-27 DIAGNOSIS — Z794 Long term (current) use of insulin: Secondary | ICD-10-CM | POA: Diagnosis not present

## 2021-01-27 DIAGNOSIS — Z899 Acquired absence of limb, unspecified: Secondary | ICD-10-CM | POA: Insufficient documentation

## 2021-01-27 DIAGNOSIS — L089 Local infection of the skin and subcutaneous tissue, unspecified: Secondary | ICD-10-CM | POA: Diagnosis present

## 2021-01-27 DIAGNOSIS — Z792 Long term (current) use of antibiotics: Secondary | ICD-10-CM | POA: Diagnosis not present

## 2021-01-27 DIAGNOSIS — R0602 Shortness of breath: Secondary | ICD-10-CM

## 2021-01-27 DIAGNOSIS — T8140XA Infection following a procedure, unspecified, initial encounter: Secondary | ICD-10-CM | POA: Diagnosis present

## 2021-01-27 DIAGNOSIS — Y835 Amputation of limb(s) as the cause of abnormal reaction of the patient, or of later complication, without mention of misadventure at the time of the procedure: Secondary | ICD-10-CM | POA: Insufficient documentation

## 2021-01-27 LAB — COMPREHENSIVE METABOLIC PANEL
ALT: 14 U/L (ref 0–44)
AST: 16 U/L (ref 15–41)
Albumin: 3.8 g/dL (ref 3.5–5.0)
Alkaline Phosphatase: 83 U/L (ref 38–126)
Anion gap: 13 (ref 5–15)
BUN: 23 mg/dL — ABNORMAL HIGH (ref 6–20)
CO2: 24 mmol/L (ref 22–32)
Calcium: 8.6 mg/dL — ABNORMAL LOW (ref 8.9–10.3)
Chloride: 99 mmol/L (ref 98–111)
Creatinine, Ser: 1.15 mg/dL (ref 0.61–1.24)
GFR, Estimated: >60 mL/min (ref >60)
Glucose, Bld: 147 mg/dL — ABNORMAL HIGH (ref 70–99)
Potassium: 4.6 mmol/L (ref 3.5–5.1)
Sodium: 136 mmol/L (ref 135–145)
Total Bilirubin: 0.3 mg/dL (ref 0.3–1.2)
Total Protein: 7.4 g/dL (ref 6.5–8.1)

## 2021-01-27 LAB — C-REACTIVE PROTEIN: CRP: 1.3 mg/dL — ABNORMAL HIGH (ref <1.0)

## 2021-01-27 LAB — CBC WITH DIFFERENTIAL/PLATELET
Abs Immature Granulocytes: 0.02 10*3/uL (ref 0.00–0.07)
Basophils Absolute: 0 10*3/uL (ref 0.0–0.1)
Basophils Relative: 0 %
Eosinophils Absolute: 0.2 10*3/uL (ref 0.0–0.5)
Eosinophils Relative: 3 %
HCT: 33.1 % — ABNORMAL LOW (ref 39.0–52.0)
Hemoglobin: 10.4 g/dL — ABNORMAL LOW (ref 13.0–17.0)
Immature Granulocytes: 0 %
Lymphocytes Relative: 38 %
Lymphs Abs: 2.8 10*3/uL (ref 0.7–4.0)
MCH: 25.9 pg — ABNORMAL LOW (ref 26.0–34.0)
MCHC: 31.4 g/dL (ref 30.0–36.0)
MCV: 82.3 fL (ref 80.0–100.0)
Monocytes Absolute: 0.8 10*3/uL (ref 0.1–1.0)
Monocytes Relative: 11 %
Neutro Abs: 3.5 10*3/uL (ref 1.7–7.7)
Neutrophils Relative %: 48 %
Platelets: 370 10*3/uL (ref 150–400)
RBC: 4.02 MIL/uL — ABNORMAL LOW (ref 4.22–5.81)
RDW: 14.8 % (ref 11.5–15.5)
WBC: 7.4 10*3/uL (ref 4.0–10.5)
nRBC: 0 % (ref 0.0–0.2)

## 2021-01-27 LAB — SEDIMENTATION RATE: Sed Rate: 56 mm/hr — ABNORMAL HIGH (ref 0–20)

## 2021-01-27 LAB — GLUCOSE, POCT (MANUAL RESULT ENTRY): POC Glucose: 126 mg/dl — AB (ref 70–99)

## 2021-01-27 MED ORDER — ALBUTEROL SULFATE HFA 108 (90 BASE) MCG/ACT IN AERS
2.0000 | INHALATION_SPRAY | Freq: Four times a day (QID) | RESPIRATORY_TRACT | 0 refills | Status: DC | PRN
  Filled 2021-01-27: qty 6.7, 25d supply, fill #0

## 2021-01-27 MED ORDER — INSULIN LISPRO (1 UNIT DIAL) 100 UNIT/ML (KWIKPEN)
7.0000 [IU] | PEN_INJECTOR | Freq: Three times a day (TID) | SUBCUTANEOUS | 3 refills | Status: DC
  Filled 2021-01-27: qty 15, 72d supply, fill #0

## 2021-01-27 MED ORDER — SULFAMETHOXAZOLE-TRIMETHOPRIM 800-160 MG PO TABS: 1.0000 | ORAL_TABLET | Freq: Three times a day (TID) | ORAL | 0 refills | Status: AC

## 2021-01-27 MED ORDER — AMOXICILLIN-POT CLAVULANATE 875-125 MG PO TABS
1.0000 | ORAL_TABLET | Freq: Two times a day (BID) | ORAL | 0 refills | Status: AC
Start: 2021-02-02 — End: 2021-02-09

## 2021-01-27 NOTE — Progress Notes (Signed)
NAME: Fernando Vega  DOB: 04-Aug-1970  MRN: [id number]  Date/Time: 01/27/2021 12:09 PM   Subjective:   ?Here for follow up after recent hospitalization  Fernando Vega is a 50 y.o. male with a history of Diabetes mellitus and complicated right foot infection is here for follow up.  He was recently hospitalized between Patient has complicated right foot infection. He was initially hospitalized between 11/01/2020 until 11/16/2020 for infection of the right foot.  He underwent partial second and third ray amputation on 11/05/2020.  The culture was positive for staph aureus and he received 6 weeks of IV cefazolin.  Because he did not have insurance he could not get a wound VAC for foot wound and hence the wound had reinfection and it had Acinetobacter and Enterobacter cloacae on 12/07/2020.  He was then hospitalized on 12/11/2020 with worsening swelling and pain and redness.  He underwent TMA on 12/13/2020 and delayed primary closure of the right TMA site on 12/16/2020.  Patient did not want to go back on IV antibiotics and he was sent home on ciprofloxacin 500 mg twice a day for 28 days on 12/20/2020.  He saw the podiatrist on 01/06/2021 when the wound had an eschar and he was asked to get admitted to the hospital.  He underwent Lisfranc amputation on 01/08/2021 at the proximal margin of all the disarticulated surface was negative for osteomyelitis.  Unfortunately the culture samples were not processed because they are placed in formalin.  But a culture was taken the next day from the JP drain that was placed in the wound.  And that was negative.  Patient was discharged on 01/15/2021 on Bactrim DS 1 tablet 3 times a day and Augmentin 875 mg twice a day.  He is here today for follow-up. He is doing well He saw the podiatrist yesterday. Sutures would not be removed for another 2 weeks. He has no fever or chills or diarrhea. He has been asked to bear weight with a cam boot  Past Medical History:  Diagnosis Date    Diabetes mellitus without complication ([place])    Neuropathy    Sarcoidosis    Sleep apnea     Past Surgical History:  Procedure Laterality Date   AMPUTATION Right 12/13/2020   Procedure: AMPUTATION midFOOT-RIGHT;  Surgeon: Criselda Peaches, DPM;  Location: [facility];  Service: Podiatry;  Laterality: Right;   AMPUTATION Right 11/05/2020   Procedure: AMPUTATION RAY-Partial Ray Amp 2nd & 3rd Digit;  Surgeon: Edrick Kins, DPM;  Location: [facility];  Service: Podiatry;  Laterality: Right;   APPLICATION OF WOUND VAC Right 11/09/2020   Procedure: APPLICATION OF WOUND VAC;  Surgeon: Edrick Kins, DPM;  Location: [facility];  Service: Podiatry;  Laterality: Right;   BONE BIOPSY Right 01/08/2021   Procedure: BONE BIOPSY;  Surgeon: Trula Slade, DPM;  Location: [facility];  Service: Podiatry;  Laterality: Right;   INCISION AND DRAINAGE Right 11/01/2020   Procedure: INCISION AND DRAINAGE-Right Foot;  Surgeon: Criselda Peaches, DPM;  Location: [facility];  Service: Podiatry;  Laterality: Right;   IRRIGATION AND DEBRIDEMENT FOOT Right 11/09/2020   Procedure: IRRIGATION AND DEBRIDEMENT FOOT;  Surgeon: Edrick Kins, DPM;  Location: [facility];  Service: Podiatry;  Laterality: Right;   IRRIGATION AND DEBRIDEMENT FOOT Right 12/13/2020   Procedure: IRRIGATION AND DEBRIDEMENT FOOT;  Surgeon: Criselda Peaches, DPM;  Location: [facility];  Service: Podiatry;  Laterality: Right;   TRANSMETATARSAL AMPUTATION Right 01/08/2021   Procedure: [facility]  AMPUTATION;  Surgeon: Trula Slade, DPM;  Location: [facility];  Service: Podiatry;  Laterality: Right;   WOUND DEBRIDEMENT Right 12/16/2020   Procedure: DEBRIDEMENT WOUND;  Surgeon: Felipa Furnace, DPM;  Location: [facility];  Service: Podiatry;  Laterality: Right;  Transmet amputation washout & closure   WOUND DEBRIDEMENT Right 01/08/2021   Procedure: DEBRIDEMENT WOUND;  Surgeon: Trula Slade, DPM;  Location: [facility];  Service: Podiatry;  Laterality: Right;     Social History   Socioeconomic History   Marital status: Married    Spouse name: Not on file   Number of children: Not on file   Years of education: Not on file   Highest education level: Not on file  Occupational History   Not on file  Tobacco Use   Smoking status: Never   Smokeless tobacco: Never  Substance and Sexual Activity   Alcohol use: No   Drug use: No   Sexual activity: Not on file  Other Topics Concern   Not on file  Social History Narrative   Not on file   Social Determinants of Health   Financial Resource Strain: Not on file  Food Insecurity: Not on file  Transportation Needs: Not on file  Physical Activity: Not on file  Stress: Not on file  Social Connections: Not on file  Intimate Partner Violence: Not on file    Family History  Problem Relation Age of Onset   Hypertension Mother    Diabetes type II Father    Parkinson's disease Father    Heart disease Father    No Known Allergies I? Current Outpatient Medications  Medication Sig Dispense Refill   acetaminophen (TYLENOL) 500 MG tablet Take 1 tablet (500 mg total) by mouth every 6 (six) hours as needed for mild pain (or Fever >/= 101). 30 tablet 0   albuterol (VENTOLIN HFA) 108 (90 Base) MCG/ACT inhaler Inhale 2 puffs into the lungs once every 6 (six) hours as needed for wheezing or shortness of breath. 6.7 g 0   amLODipine (NORVASC) 10 MG tablet Take 1 tablet (10 mg total) by mouth once daily. 30 tablet 2   amoxicillin-clavulanate (AUGMENTIN) 875-125 MG tablet Take 1 tablet by mouth 2 (two) times daily with a meal for 17 days. 34 tablet 0   Ascorbic Acid (VITAMIN C WITH ROSE HIPS) 500 MG tablet Take 500 mg by mouth daily.     ascorbic acid (VITAMIN C) 250 MG tablet Take 1 tablet (250 mg total) by mouth once daily. 30 tablet 0   gabapentin (NEURONTIN) 300 MG capsule Take 1 capsule (300 mg total) by mouth once daily at bedtime. 30 capsule 2   Insulin Glargine (BASAGLAR KWIKPEN) 100 UNIT/ML Inject 15  Units into the skin at bedtime. 15 mL 3   insulin lispro (HUMALOG KWIKPEN) 100 UNIT/ML KwikPen Inject 10 Units into the skin 3 (three) times daily before meals. BG 201-250 Add 1 unit =11 units; BG 251-300 Add 2 units =12 units BG 301- 350 Add 3 units =13 units BG 351-400 Add 4 units =14 units If over 400 units add 5 units, recheck in 2 hours if still over 400 go to the hospital. 15 mL 3   Insulin Pen Needle 32G X 6 MM MISC USE AS DIRECTED WITH INSULIN PENS. 100 each 1   Multiple Vitamin (MULTIVITAMIN WITH MINERALS) TABS tablet Take 1 tablet by mouth once daily. 30 tablet 0   oxyCODONE-acetaminophen (PERCOCET) 5-325 MG tablet Take 1 tablet by mouth every 4 (  four) hours as needed for severe pain. 30 tablet 0   pantoprazole (PROTONIX) 40 MG tablet Take 1 tablet (40 mg total) by mouth once daily. 30 tablet 2   sulfamethoxazole-trimethoprim (BACTRIM DS) 800-160 MG tablet Take 1 tablet by mouth 3 (three) times daily for 17 days. 51 tablet 0   traMADol (ULTRAM) 50 MG tablet Take 1 tablet (50 mg total) by mouth every 6 (six) hours as needed for moderate pain. 30 tablet 0   No current facility-administered medications for this visit.     Abtx:  Anti-infectives (From admission, onward)    None       REVIEW OF SYSTEMS:  Const: negative fever, negative chills, negative weight loss Eyes: negative diplopia or visual changes, negative eye pain ENT: negative coryza, negative sore throat Resp: negative cough, hemoptysis, dyspnea Cards: negative for chest pain, palpitations, lower extremity edema GU: negative for frequency, dysuria and hematuria GI: Negative for abdominal pain, diarrhea, bleeding, constipation Skin: negative for rash and pruritus Heme: negative for easy bruising and gum/nose bleeding MS: Weakness Neurolo:negative for headaches, dizziness, vertigo, memory problems  Psych: negative for feelings of anxiety, depression  Endocrine:  diabetes Allergy/Immunology- negative for any  medication or food allergies  Objective:  VITALS:  BP (!) 145/80   Pulse 93   Temp 98.2 F (36.8 C)   Resp 16   Ht 5\' 7"  (1.702 m)   Wt 189 lb (85.7 kg)   SpO2 95%   BMI 29.60 kg/m  PHYSICAL EXAM:  General: Alert, cooperative, no distress, appears stated age.  Pale Head: Normocephalic, without obvious abnormality, atraumatic. Eyes: Conjunctivae clear, anicteric sclerae. Pupils are equal ENT Nares normal. No drainage or sinus tenderness. Edentulous Neck: Supple, symmetrical, no adenopathy, thyroid: non tender no carotid bruit and no JVD. Back: No CVA tenderness. Lungs: Clear to auscultation bilaterally. No Wheezing or Rhonchi. No rales. Heart: Regular rate and rhythm, no murmur, rub or gallop. Abdomen: Soft, non-tender,not distended. Bowel sounds normal. No masses Extremities: Right foot surgical site examined Staples in place Some edema No wound dehiscence       skin: No rashes or lesions. Or bruising Lymph: Cervical, supraclavicular normal. Neurologic: Grossly non-focal    ? Impression/Recommendation Diabetic foot infection of the right foot.  Status post TMA on 12/13/2020 and revision Lisfranc amputation on 01/08/2021. Bone margins are negative for osteomyelitis during the last surgery Patient is currently on Bactrim DS 1 tablet 3 times a day and Augmentin 875 mg twice a day. Patient was to finish on 02/01/2021.  Will extend it by another week.  Patient still has sutures in place. There is some swelling of the stump Told patient that he has to elevate the leg while he is sitting.    DM on insulin   Anemia  Discussed the management with the patient in great detail. Will follow him in 3 weeks time.   Labs were done today and creatinine is 1.15.  Was 0.79 and he was discharged from the hospital.? Hb 10.4.  Was 9.4 on 01/15/2021. _____________________________________________ Note:  This document was prepared using Dragon voice recognition software and may  include unintentional dictation errors.

## 2021-01-27 NOTE — Patient Instructions (Addendum)
You are here for follow up of rt foot infection-you are on bactrim and Augmentin- the current prescription finishes on 11/22- I have extended that for a week more- I will see you on 12 /1/22 once you have the staples removed by podiatrist

## 2021-01-27 NOTE — Progress Notes (Signed)
Established Patient Office Visit  Subjective:  Patient ID: Fernando Vega, male    DOB: 14-Dec-1970  Age: 50 y.o. MRN: [id number]  CC:  Chief Complaint  Patient presents with   Follow-up   Diabetes    HPI Fernando Vega is a 50 y/o male who has history of Type 2 diabetes mellitus with neuropathy, sarcoidosis, sleep apnea, hypertension, presents for routine follow up and medication refill. His HgbA1c done on 12/11/20 decreased from 11.8% to 8%. He states that hes' compliant with his medication and continues to make healthy dietary changes. He was unable to check his blood glucose because his test stripes did not fit in his glucometer, but he was provided with a meter that matches his stripes. His blood glucose checked during visit was 126 mg/dl. He states that he experienced a hypoglycemic episode 4 days ago and his blood glucose reading was 54 mg/dl and he treated symptoms. He reports that his peripheral neuropathy is under control with taking gabapentin. He was seen by the Podiatrist Dr Amalia Hailey on 01/25/21 for follow up visit after his right foot revisional Lisfranc amputation done on 01/08/21. He was advised to continue clean dry dressing x 1 week, weight bearing with cam boot. He denies erythema, pain nor discharge from stapled surgical site to right foot. Overall, he states that he's doing well and offers no further complaint.  Past Medical History:  Diagnosis Date   Diabetes mellitus without complication ([place])    Neuropathy    Sarcoidosis    Sleep apnea     Past Surgical History:  Procedure Laterality Date   AMPUTATION Right 12/13/2020   Procedure: AMPUTATION midFOOT-RIGHT;  Surgeon: Criselda Peaches, DPM;  Location: [facility];  Service: Podiatry;  Laterality: Right;   AMPUTATION Right 11/05/2020   Procedure: AMPUTATION RAY-Partial Ray Amp 2nd & 3rd Digit;  Surgeon: Edrick Kins, DPM;  Location: [facility];  Service: Podiatry;  Laterality: Right;   APPLICATION OF WOUND VAC Right 11/09/2020    Procedure: APPLICATION OF WOUND VAC;  Surgeon: Edrick Kins, DPM;  Location: [facility];  Service: Podiatry;  Laterality: Right;   BONE BIOPSY Right 01/08/2021   Procedure: BONE BIOPSY;  Surgeon: Trula Slade, DPM;  Location: [facility];  Service: Podiatry;  Laterality: Right;   INCISION AND DRAINAGE Right 11/01/2020   Procedure: INCISION AND DRAINAGE-Right Foot;  Surgeon: Criselda Peaches, DPM;  Location: [facility];  Service: Podiatry;  Laterality: Right;   IRRIGATION AND DEBRIDEMENT FOOT Right 11/09/2020   Procedure: IRRIGATION AND DEBRIDEMENT FOOT;  Surgeon: Edrick Kins, DPM;  Location: [facility];  Service: Podiatry;  Laterality: Right;   IRRIGATION AND DEBRIDEMENT FOOT Right 12/13/2020   Procedure: IRRIGATION AND DEBRIDEMENT FOOT;  Surgeon: Criselda Peaches, DPM;  Location: [facility];  Service: Podiatry;  Laterality: Right;   TRANSMETATARSAL AMPUTATION Right 01/08/2021   Procedure: LISFRANC  AMPUTATION;  Surgeon: Trula Slade, DPM;  Location: [facility];  Service: Podiatry;  Laterality: Right;   WOUND DEBRIDEMENT Right 12/16/2020   Procedure: DEBRIDEMENT WOUND;  Surgeon: Felipa Furnace, DPM;  Location: [facility];  Service: Podiatry;  Laterality: Right;  Transmet amputation washout & closure   WOUND DEBRIDEMENT Right 01/08/2021   Procedure: DEBRIDEMENT WOUND;  Surgeon: Trula Slade, DPM;  Location: [facility];  Service: Podiatry;  Laterality: Right;    Family History  Problem Relation Age of Onset   Hypertension Mother    Diabetes type II Father    Parkinson's disease Father  Heart disease Father     Social History   Socioeconomic History   Marital status: Married    Spouse name: Not on file   Number of children: Not on file   Years of education: Not on file   Highest education level: Not on file  Occupational History   Not on file  Tobacco Use   Smoking status: Never   Smokeless tobacco: Never  Vaping Use   Vaping Use: Never used  Substance and Sexual Activity    Alcohol use: No   Drug use: No   Sexual activity: Not on file  Other Topics Concern   Not on file  Social History Narrative   Not on file   Social Determinants of Health   Financial Resource Strain: Not on file  Food Insecurity: No Food Insecurity   Worried About Running Out of Food in the Last Year: Never true   [place] in the Last Year: Never true  Transportation Needs: No Transportation Needs   Lack of Transportation (Medical): No   Lack of Transportation (Non-Medical): No  Physical Activity: Not on file  Stress: Not on file  Social Connections: Not on file  Intimate Partner Violence: Not on file    Outpatient Medications Prior to Visit  Medication Sig Dispense Refill   acetaminophen (TYLENOL) 500 MG tablet Take 1 tablet (500 mg total) by mouth every 6 (six) hours as needed for mild pain (or Fever >/= 101). 30 tablet 0   amLODipine (NORVASC) 10 MG tablet Take 1 tablet (10 mg total) by mouth once daily. 30 tablet 2   [START ON 02/02/2021] amoxicillin-clavulanate (AUGMENTIN) 875-125 MG tablet Take 1 tablet by mouth 2 (two) times daily with a meal for 7 days. 14 tablet 0   Ascorbic Acid (VITAMIN C WITH ROSE HIPS) 500 MG tablet Take 500 mg by mouth daily.     gabapentin (NEURONTIN) 300 MG capsule Take 1 capsule (300 mg total) by mouth once daily at bedtime. 30 capsule 2   Insulin Glargine (BASAGLAR KWIKPEN) 100 UNIT/ML Inject 15 Units into the skin at bedtime. 15 mL 3   Insulin Pen Needle 32G X 6 MM MISC USE AS DIRECTED WITH INSULIN PENS. 100 each 1   Multiple Vitamin (MULTIVITAMIN WITH MINERALS) TABS tablet Take 1 tablet by mouth once daily. 30 tablet 0   oxyCODONE-acetaminophen (PERCOCET) 5-325 MG tablet Take 1 tablet by mouth every 4 (four) hours as needed for severe pain. 30 tablet 0   [START ON 02/02/2021] sulfamethoxazole-trimethoprim (BACTRIM DS) 800-160 MG tablet Take 1 tablet by mouth 3 (three) times daily for 7 days. 21 tablet 0   albuterol (VENTOLIN HFA) 108  (90 Base) MCG/ACT inhaler Inhale 2 puffs into the lungs once every 6 (six) hours as needed for wheezing or shortness of breath. 6.7 g 0   insulin lispro (HUMALOG KWIKPEN) 100 UNIT/ML KwikPen Inject 10 Units into the skin 3 (three) times daily before meals. BG 201-250 Add 1 unit =11 units; BG 251-300 Add 2 units =12 units BG 301- 350 Add 3 units =13 units BG 351-400 Add 4 units =14 units If over 400 units add 5 units, recheck in 2 hours if still over 400 go to the hospital. 15 mL 3   pantoprazole (PROTONIX) 40 MG tablet Take 1 tablet (40 mg total) by mouth once daily. (Patient not taking: Reported on 01/27/2021) 30 tablet 2   traMADol (ULTRAM) 50 MG tablet Take 1 tablet (50 mg total) by mouth every 6 (  six) hours as needed for moderate pain. (Patient not taking: Reported on 01/27/2021) 30 tablet 0   ascorbic acid (VITAMIN C) 250 MG tablet Take 1 tablet (250 mg total) by mouth once daily. 30 tablet 0   No facility-administered medications prior to visit.    No Known Allergies  ROS Review of Systems  Constitutional: Negative.   Eyes: Negative.   Respiratory: Negative.    Cardiovascular: Negative.   Endocrine: Negative.   Skin:        Staples to right metatarsal amputation, dressing intact  Neurological: Negative.      Objective:    Physical Exam HENT:     Head: Normocephalic and atraumatic.     Mouth/Throat:     Mouth: Mucous membranes are moist.  Eyes:     Extraocular Movements: Extraocular movements intact.     Conjunctiva/sclera: Conjunctivae normal.     Pupils: Pupils are equal, round, and reactive to light.  Cardiovascular:     Rate and Rhythm: Normal rate and regular rhythm.     Pulses: Normal pulses.     Heart sounds: Normal heart sounds.  Pulmonary:     Effort: Pulmonary effort is normal.     Breath sounds: Normal breath sounds.  Skin:    General: Skin is warm.  Neurological:     General: No focal deficit present.     Mental Status: He is alert and oriented to  person, place, and time. Mental status is at baseline.  Psychiatric:        Mood and Affect: Mood normal.        Behavior: Behavior normal.        Thought Content: Thought content normal.        Judgment: Judgment normal.    BP 135/80 (BP Location: Left Arm, Patient Position: Sitting, Cuff Size: Large)   Pulse 91   Temp (!) 97.4 F (36.3 C) (Oral)   Resp 16   Ht 5\' 6"  (1.676 m)   Wt 199 lb 8 oz (90.5 kg)   SpO2 97%   BMI 32.20 kg/m  Wt Readings from Last 3 Encounters:  01/27/21 199 lb 8 oz (90.5 kg)  01/27/21 189 lb (85.7 kg)  01/06/21 189 lb (85.7 kg)     Health Maintenance Due  Topic Date Due   COVID-19 Vaccine (1) Never done   FOOT EXAM  Never done   OPHTHALMOLOGY EXAM  Never done   URINE MICROALBUMIN  Never done   Hepatitis C Screening  Never done   Zoster Vaccines- Shingrix (1 of 2) Never done   COLONOSCOPY (Pts 45-62yrs Insurance coverage will need to be confirmed)  Never done    There are no preventive care reminders to display for this patient.  No results found for: TSH Lab Results  Component Value Date   WBC 7.4 01/27/2021   HGB 10.4 (L) 01/27/2021   HCT 33.1 (L) 01/27/2021   MCV 82.3 01/27/2021   PLT 370 01/27/2021   Lab Results  Component Value Date   NA 136 01/27/2021   K 4.6 01/27/2021   CO2 24 01/27/2021   GLUCOSE 147 (H) 01/27/2021   BUN 23 (H) 01/27/2021   CREATININE 1.15 01/27/2021   BILITOT 0.3 01/27/2021   ALKPHOS 83 01/27/2021   AST 16 01/27/2021   ALT 14 01/27/2021   PROT 7.4 01/27/2021   ALBUMIN 3.8 01/27/2021   CALCIUM 8.6 (L) 01/27/2021   ANIONGAP 13 01/27/2021   Lab Results  Component Value Date  CHOL 140 12/03/2012   Lab Results  Component Value Date   HDL 31 (L) 12/03/2012   Lab Results  Component Value Date   LDLCALC 70 12/03/2012   Lab Results  Component Value Date   TRIG 197 12/03/2012   No results found for: [facility] Lab Results  Component Value Date   HGBA1C 8.0 (H) 12/11/2020      Assessment &  Plan:    1. Type 2 diabetes mellitus with peripheral neuropathy (HCC) - His diabetes is improving, his HgbA1c was 8% and his goal should be less than 7%. He was encouraged to check blood glucose tid, record and bring log to follow up appointment. Due to his hypoglycemic episode, his Insulin Lispro was decreased to 7 units tid with meals and sliding scale. He was advised to continue on low carb/non concentrated sweet diet. - POCT Glucose (CBG); Future - HgB A1c; Future - Urine Microalbumin w/creat. ratio; Future - POCT Glucose (CBG) - insulin lispro (HUMALOG KWIKPEN) 100 UNIT/ML KwikPen; Inject 7 Units into the skin 3 (three) times daily before meals.  Dispense: 15 mL; Refill: 3  2. Shortness of breath on exertion - His breathing is stable, but states that he uses inhaler when experiencing intermittent shortness of breath with exertion. - albuterol (VENTOLIN HFA) 108 (90 Base) MCG/ACT inhaler; Inhale 2 puffs into the lungs once every 6 (six) hours as needed for wheezing or shortness of breath.  Dispense: 6.7 g; Refill: 0     Follow-up: Return in about 8 weeks (around 03/22/2021), or if symptoms worsen or fail to improve.    Tema Alire Jerold Coombe, NP

## 2021-01-27 NOTE — Patient Instructions (Signed)
DASH Eating Plan DASH stands for Dietary Approaches to Stop Hypertension. The DASH eating plan is a healthy eating plan that has been shown to: Reduce high blood pressure (hypertension). Reduce your risk for type 2 diabetes, heart disease, and stroke. Help with weight loss. What are tips for following this plan? Reading food labels Check food labels for the amount of salt (sodium) per serving. Choose foods with less than 5 percent of the Daily Value of sodium. Generally, foods with less than 300 milligrams (mg) of sodium per serving fit into this eating plan. To find whole grains, look for the word "whole" as the first word in the ingredient list. Shopping Buy products labeled as "low-sodium" or "no salt added." Buy fresh foods. Avoid canned foods and pre-made or frozen meals. Cooking Avoid adding salt when cooking. Use salt-free seasonings or herbs instead of table salt or sea salt. Check with your health care provider or pharmacist before using salt substitutes. Do not fry foods. Cook foods using healthy methods such as baking, boiling, grilling, roasting, and broiling instead. Cook with heart-healthy oils, such as olive, canola, avocado, soybean, or sunflower oil. Meal planning  Eat a balanced diet that includes: 4 or more servings of fruits and 4 or more servings of vegetables each day. Try to fill one-half of your plate with fruits and vegetables. 6-8 servings of whole grains each day. Less than 6 oz (170 g) of lean meat, poultry, or fish each day. A 3-oz (85-g) serving of meat is about the same size as a deck of cards. One egg equals 1 oz (28 g). 2-3 servings of low-fat dairy each day. One serving is 1 cup (237 mL). 1 serving of nuts, seeds, or beans 5 times each week. 2-3 servings of heart-healthy fats. Healthy fats called omega-3 fatty acids are found in foods such as walnuts, flaxseeds, fortified milks, and eggs. These fats are also found in cold-water fish, such as sardines, salmon,  and mackerel. Limit how much you eat of: Canned or prepackaged foods. Food that is high in trans fat, such as some fried foods. Food that is high in saturated fat, such as fatty meat. Desserts and other sweets, sugary drinks, and other foods with added sugar. Full-fat dairy products. Do not salt foods before eating. Do not eat more than 4 egg yolks a week. Try to eat at least 2 vegetarian meals a week. Eat more home-cooked food and less restaurant, buffet, and fast food. Lifestyle When eating at a restaurant, ask that your food be prepared with less salt or no salt, if possible. If you drink alcohol: Limit how much you use to: 0-1 drink a day for women who are not pregnant. 0-2 drinks a day for men. Be aware of how much alcohol is in your drink. In the [place] one drink equals one 12 oz bottle of beer (355 mL), one 5 oz glass of wine (148 mL), or one 1 oz glass of hard liquor (44 mL). General information Avoid eating more than 2,300 mg of salt a day. If you have hypertension, you may need to reduce your sodium intake to 1,500 mg a day. Work with your health care provider to maintain a healthy body weight or to lose weight. Ask what an ideal weight is for you. Get at least 30 minutes of exercise that causes your heart to beat faster (aerobic exercise) most days of the week. Activities may include walking, swimming, or biking. Work with your health care provider or dietitian to   adjust your eating plan to your individual calorie needs. What foods should I eat? Fruits All fresh, dried, or frozen fruit. Canned fruit in natural juice (without added sugar). Vegetables Fresh or frozen vegetables (raw, steamed, roasted, or grilled). Low-sodium or reduced-sodium tomato and vegetable juice. Low-sodium or reduced-sodium tomato sauce and tomato paste. Low-sodium or reduced-sodium canned vegetables. Grains Whole-grain or whole-wheat bread. Whole-grain or whole-wheat pasta. Brown rice. Oatmeal. Quinoa.  Bulgur. Whole-grain and low-sodium cereals. Pita bread. Low-fat, low-sodium crackers. Whole-wheat flour tortillas. Meats and other proteins Skinless chicken or turkey. Ground chicken or turkey. Pork with fat trimmed off. Fish and seafood. Egg whites. Dried beans, peas, or lentils. Unsalted nuts, nut butters, and seeds. Unsalted canned beans. Lean cuts of beef with fat trimmed off. Low-sodium, lean precooked or cured meat, such as sausages or meat loaves. Dairy Low-fat (1%) or fat-free (skim) milk. Reduced-fat, low-fat, or fat-free cheeses. Nonfat, low-sodium ricotta or cottage cheese. Low-fat or nonfat yogurt. Low-fat, low-sodium cheese. Fats and oils Soft margarine without trans fats. Vegetable oil. Reduced-fat, low-fat, or light mayonnaise and salad dressings (reduced-sodium). Canola, safflower, olive, avocado, soybean, and sunflower oils. Avocado. Seasonings and condiments Herbs. Spices. Seasoning mixes without salt. Other foods Unsalted popcorn and pretzels. Fat-free sweets. The items listed above may not be a complete list of foods and beverages you can eat. Contact a dietitian for more information. What foods should I avoid? Fruits Canned fruit in a light or heavy syrup. Fried fruit. Fruit in cream or butter sauce. Vegetables Creamed or fried vegetables. Vegetables in a cheese sauce. Regular canned vegetables (not low-sodium or reduced-sodium). Regular canned tomato sauce and paste (not low-sodium or reduced-sodium). Regular tomato and vegetable juice (not low-sodium or reduced-sodium). Pickles. Olives. Grains Baked goods made with fat, such as croissants, muffins, or some breads. Dry pasta or rice meal packs. Meats and other proteins Fatty cuts of meat. Ribs. Fried meat. Bacon. Bologna, salami, and other precooked or cured meats, such as sausages or meat loaves. Fat from the back of a pig (fatback). Bratwurst. Salted nuts and seeds. Canned beans with added salt. Canned or smoked fish.  Whole eggs or egg yolks. Chicken or turkey with skin. Dairy Whole or 2% milk, cream, and half-and-half. Whole or full-fat cream cheese. Whole-fat or sweetened yogurt. Full-fat cheese. Nondairy creamers. Whipped toppings. Processed cheese and cheese spreads. Fats and oils Butter. Stick margarine. Lard. Shortening. Ghee. Bacon fat. Tropical oils, such as coconut, palm kernel, or palm oil. Seasonings and condiments Onion salt, garlic salt, seasoned salt, table salt, and sea salt. Worcestershire sauce. Tartar sauce. Barbecue sauce. Teriyaki sauce. Soy sauce, including reduced-sodium. Steak sauce. Canned and packaged gravies. Fish sauce. Oyster sauce. Cocktail sauce. Store-bought horseradish. Ketchup. Mustard. Meat flavorings and tenderizers. Bouillon cubes. Hot sauces. Pre-made or packaged marinades. Pre-made or packaged taco seasonings. Relishes. Regular salad dressings. Other foods Salted popcorn and pretzels. The items listed above may not be a complete list of foods and beverages you should avoid. Contact a dietitian for more information. Where to find more information National Heart, Lung, and Blood Institute: www.nhlbi.nih.gov American Heart Association: www.heart.org Academy of Nutrition and Dietetics: www.eatright.org National Kidney Foundation: www.kidney.org Summary The DASH eating plan is a healthy eating plan that has been shown to reduce high blood pressure (hypertension). It may also reduce your risk for type 2 diabetes, heart disease, and stroke. When on the DASH eating plan, aim to eat more fresh fruits and vegetables, whole grains, lean proteins, low-fat dairy, and heart-healthy fats. With the DASH   eating plan, you should limit salt (sodium) intake to 2,300 mg a day. If you have hypertension, you may need to reduce your sodium intake to 1,500 mg a day. Work with your health care provider or dietitian to adjust your eating plan to your individual calorie needs. This information is not  intended to replace advice given to you by your health care provider. Make sure you discuss any questions you have with your health care provider. Document Revised: 01/31/2019 Document Reviewed: 01/31/2019 Elsevier Patient Education  2022 [place] for Diabetes Mellitus, Adult Carbohydrate counting is a method of keeping track of how many carbohydrates you eat. Eating carbohydrates increases the amount of sugar (glucose) in the blood. Counting how many carbohydrates you eat improves how well you manage your blood glucose. This, in turn, helps you manage your diabetes. Carbohydrates are measured in grams (g) per serving. It is important to know how many carbohydrates (in grams or by serving size) you can have in each meal. This is different for every person. A dietitian can help you make a meal plan and calculate how many carbohydrates you should have at each meal and snack. What foods contain carbohydrates? Carbohydrates are found in the following foods: Grains, such as breads and cereals. Dried beans and soy products. Starchy vegetables, such as potatoes, peas, and corn. Fruit and fruit juices. Milk and yogurt. Sweets and snack foods, such as cake, cookies, candy, chips, and soft drinks. How do I count carbohydrates in foods? There are two ways to count carbohydrates in food. You can read food labels or learn standard serving sizes of foods. You can use either of these methods or a combination of both. Using the Nutrition Facts label The Nutrition Facts list is included on the labels of almost all packaged foods and beverages in the [place]. It includes: The serving size. Information about nutrients in each serving, including the grams of carbohydrate per serving. To use the Nutrition Facts, decide how many servings you will have. Then, multiply the number of servings by the number of carbohydrates per serving. The resulting number is the total grams of  carbohydrates that you will be having. Learning the standard serving sizes of foods When you eat carbohydrate foods that are not packaged or do not include Nutrition Facts on the label, you need to measure the servings in order to count the grams of carbohydrates. Measure the foods that you will eat with a food scale or measuring cup, if needed. Decide how many standard-size servings you will eat. Multiply the number of servings by 15. For foods that contain carbohydrates, one serving equals 15 g of carbohydrates. For example, if you eat 2 cups or 10 oz (300 g) of strawberries, you will have eaten 2 servings and 30 g of carbohydrates (2 servings x 15 g = 30 g). For foods that have more than one food mixed, such as soups and casseroles, you must count the carbohydrates in each food that is included. The following list contains standard serving sizes of common carbohydrate-rich foods. Each of these servings has about 15 g of carbohydrates: 1 slice of bread. 1 six-inch (15 cm) tortilla. ? cup or 2 oz (53 g) cooked rice or pasta.  cup or 3 oz (85 g) cooked or canned, drained and rinsed beans or lentils.  cup or 3 oz (85 g) starchy vegetable, such as peas, corn, or squash.  cup or 4 oz (120 g) hot cereal.  cup or 3 oz (85  g) boiled or mashed potatoes, or  or 3 oz (85 g) of a large baked potato.  cup or 4 fl oz (118 mL) fruit juice. 1 cup or 8 fl oz (237 mL) milk. 1 small or 4 oz (106 g) apple.  or 2 oz (63 g) of a medium banana. 1 cup or 5 oz (150 g) strawberries. 3 cups or 1 oz (28.3 g) popped popcorn. What is an example of carbohydrate counting? To calculate the grams of carbohydrates in this sample meal, follow the steps shown below. Sample meal 3 oz (85 g) chicken breast. ? cup or 4 oz (106 g) brown rice.  cup or 3 oz (85 g) corn. 1 cup or 8 fl oz (237 mL) milk. 1 cup or 5 oz (150 g) strawberries with sugar-free whipped topping. Carbohydrate calculation Identify the foods that  contain carbohydrates: Rice. Corn. Milk. Strawberries. Calculate how many servings you have of each food: 2 servings rice. 1 serving corn. 1 serving milk. 1 serving strawberries. Multiply each number of servings by 15 g: 2 servings rice x 15 g = 30 g. 1 serving corn x 15 g = 15 g. 1 serving milk x 15 g = 15 g. 1 serving strawberries x 15 g = 15 g. Add together all of the amounts to find the total grams of carbohydrates eaten: 30 g + 15 g + 15 g + 15 g = 75 g of carbohydrates total. What are tips for following this plan? Shopping Develop a meal plan and then make a shopping list. Buy fresh and frozen vegetables, fresh and frozen fruit, dairy, eggs, beans, lentils, and whole grains. Look at food labels. Choose foods that have more fiber and less sugar. Avoid processed foods and foods with added sugars. Meal planning Aim to have the same number of grams of carbohydrates at each meal and for each snack time. Plan to have regular, balanced meals and snacks. Where to find more information American Diabetes Association: diabetes.org Centers for Disease Control and Prevention: Store[employer].com.[place] Academy of Nutrition and Dietetics: eatright.org Association of Diabetes Care & Education Specialists: diabeteseducator.org Summary Carbohydrate counting is a method of keeping track of how many carbohydrates you eat. Eating carbohydrates increases the amount of sugar (glucose) in your blood. Counting how many carbohydrates you eat improves how well you manage your blood glucose. This helps you manage your diabetes. A dietitian can help you make a meal plan and calculate how many carbohydrates you should have at each meal and snack. This information is not intended to replace advice given to you by your health care provider. Make sure you discuss any questions you have with your health care provider. Document Revised: 10/01/2019 Document Reviewed: 10/01/2019 Elsevier Patient Education  [place].

## 2021-01-28 ENCOUNTER — Other Ambulatory Visit: Payer: Self-pay

## 2021-01-28 MED ORDER — SULFAMETHOXAZOLE-TRIMETHOPRIM 800-160 MG PO TABS
ORAL_TABLET | ORAL | 0 refills | Status: DC
  Filled 2021-01-28: qty 21, 7d supply, fill #0

## 2021-01-28 MED ORDER — AMOXICILLIN-POT CLAVULANATE 875-125 MG PO TABS
ORAL_TABLET | ORAL | 0 refills | Status: DC
  Filled 2021-01-28: qty 14, 7d supply, fill #0

## 2021-02-08 ENCOUNTER — Other Ambulatory Visit: Payer: Self-pay

## 2021-02-08 ENCOUNTER — Ambulatory Visit (INDEPENDENT_AMBULATORY_CARE_PROVIDER_SITE_OTHER): Payer: Self-pay

## 2021-02-08 ENCOUNTER — Ambulatory Visit (INDEPENDENT_AMBULATORY_CARE_PROVIDER_SITE_OTHER): Payer: Self-pay | Admitting: Podiatry

## 2021-02-08 DIAGNOSIS — Z9889 Other specified postprocedural states: Secondary | ICD-10-CM

## 2021-02-08 NOTE — Progress Notes (Signed)
   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 DOS: 01/08/2021.  Patient states that he feels well.  He is kept the dressings clean and dry and intact.  Overall the patient is doing very well.  No new complaints at this time  Past Medical History:  Diagnosis Date   Diabetes mellitus without complication ([place])    Neuropathy    Sarcoidosis    Sleep apnea       Objective/Physical Exam Neurovascular status intact.  Skin incisions appear to be well coapted with sutures and staples intact. No sign of infectious process noted. No dehiscence. No active bleeding noted.  Negative for any significant edema noted to the surgical extremity.   Assessment: 1. s/p Lisfranc amputation right foot. DOS: 01/08/2021   Plan of Care:  1. Patient was evaluated. 2.  Sutures and staples removed.  Overall the patient is doing very well 3.  Patient may begin washing and showering and getting the foot wet.  Recommend clean cotton socks daily 4.  Continue weightbearing in the cam boot 5.  Return to clinic in 3 weeks  Edrick Kins, DPM [facility]  Dr. Edrick Kins, DPM    [address],  [address]                Office [phone]  Fax [phone]

## 2021-02-10 ENCOUNTER — Other Ambulatory Visit: Payer: Self-pay

## 2021-02-10 ENCOUNTER — Ambulatory Visit: Payer: Medicaid Other | Attending: Infectious Diseases | Admitting: Infectious Diseases

## 2021-02-10 VITALS — BP 149/80 | HR 89 | Resp 16 | Ht 66.0 in | Wt 199.5 lb

## 2021-02-10 DIAGNOSIS — Z89431 Acquired absence of right foot: Secondary | ICD-10-CM | POA: Diagnosis not present

## 2021-02-10 DIAGNOSIS — D649 Anemia, unspecified: Secondary | ICD-10-CM | POA: Diagnosis not present

## 2021-02-10 DIAGNOSIS — L089 Local infection of the skin and subcutaneous tissue, unspecified: Secondary | ICD-10-CM | POA: Insufficient documentation

## 2021-02-10 DIAGNOSIS — Z794 Long term (current) use of insulin: Secondary | ICD-10-CM | POA: Diagnosis not present

## 2021-02-10 DIAGNOSIS — E11628 Type 2 diabetes mellitus with other skin complications: Secondary | ICD-10-CM | POA: Diagnosis present

## 2021-02-10 NOTE — Progress Notes (Signed)
NAME: Fernando Vega  DOB: 09-Apr-1970  MRN: [id number]  Date/Time: 02/10/2021 10:41 AM   Subjective:  Patient here for follow-up for the right foot infection He is status post TMA.  He has completed antibiotics and the surgical site is healed. He has  seen the podiatrist. The following is taken from the Fernando Vega is a 50 y.o. male with a history of Diabetes mellitus and complicated right foot infection is here for follow up.  Patient has complicated right foot infection. He was initially hospitalized between 11/01/2020 until 11/16/2020 for infection of the right foot.  He underwent partial second and third ray amputation on 11/05/2020.  The culture was positive for staph aureus and he received 6 weeks of IV cefazolin.  Because he did not have insurance he could not get a wound VAC for foot wound and hence the wound had reinfection and it had Acinetobacter and Enterobacter cloacae on 12/07/2020.  He was then hospitalized on 12/11/2020 with worsening swelling and pain and redness.  He underwent TMA on 12/13/2020 and delayed primary closure of the right TMA site on 12/16/2020.  Patient did not want to go back on IV antibiotics and he was sent home on ciprofloxacin 500 mg twice a day for 28 days on 12/20/2020.  He saw the podiatrist on 01/06/2021 when the wound had an eschar and he was asked to get admitted to the hospital.  He underwent Lisfranc amputation on 01/08/2021 at the proximal margin of all the disarticulated surface was negative for osteomyelitis.  Unfortunately the culture samples were not processed because they are placed in formalin.  But a culture was taken the next day from the JP drain that was placed in the wound.  And that was negative.  Patient was discharged on 01/15/2021 on Bactrim DS 1 tablet 3 times a day and Augmentin 875 mg twice a day.  I saw him on 01/27/2021 and At that antibiotics to completed on 02/08/2021 instead of 02/01/2021.  He had the staples removed yesterday by the podiatrist.   He is here today for follow-up. He is doing well He has no fever or chills or diarrhea. He has been asked to bear weight with a cam boot  Past Medical History:  Diagnosis Date   Diabetes mellitus without complication ([place])    Neuropathy    Sarcoidosis    Sleep apnea     Past Surgical History:  Procedure Laterality Date   AMPUTATION Right 12/13/2020   Procedure: AMPUTATION midFOOT-RIGHT;  Surgeon: Criselda Peaches, DPM;  Location: [facility];  Service: Podiatry;  Laterality: Right;   AMPUTATION Right 11/05/2020   Procedure: AMPUTATION RAY-Partial Ray Amp 2nd & 3rd Digit;  Surgeon: Edrick Kins, DPM;  Location: [facility];  Service: Podiatry;  Laterality: Right;   APPLICATION OF WOUND VAC Right 11/09/2020   Procedure: APPLICATION OF WOUND VAC;  Surgeon: Edrick Kins, DPM;  Location: [facility];  Service: Podiatry;  Laterality: Right;   BONE BIOPSY Right 01/08/2021   Procedure: BONE BIOPSY;  Surgeon: Trula Slade, DPM;  Location: [facility];  Service: Podiatry;  Laterality: Right;   INCISION AND DRAINAGE Right 11/01/2020   Procedure: INCISION AND DRAINAGE-Right Foot;  Surgeon: Criselda Peaches, DPM;  Location: [facility];  Service: Podiatry;  Laterality: Right;   IRRIGATION AND DEBRIDEMENT FOOT Right 11/09/2020   Procedure: IRRIGATION AND DEBRIDEMENT FOOT;  Surgeon: Edrick Kins, DPM;  Location: [facility];  Service: Podiatry;  Laterality: Right;   IRRIGATION AND DEBRIDEMENT  FOOT Right 12/13/2020   Procedure: IRRIGATION AND DEBRIDEMENT FOOT;  Surgeon: Criselda Peaches, DPM;  Location: [facility];  Service: Podiatry;  Laterality: Right;   TRANSMETATARSAL AMPUTATION Right 01/08/2021   Procedure: LISFRANC  AMPUTATION;  Surgeon: Trula Slade, DPM;  Location: [facility];  Service: Podiatry;  Laterality: Right;   WOUND DEBRIDEMENT Right 12/16/2020   Procedure: DEBRIDEMENT WOUND;  Surgeon: Felipa Furnace, DPM;  Location: [facility];  Service: Podiatry;  Laterality: Right;  Transmet amputation washout &  closure   WOUND DEBRIDEMENT Right 01/08/2021   Procedure: DEBRIDEMENT WOUND;  Surgeon: Trula Slade, DPM;  Location: [facility];  Service: Podiatry;  Laterality: Right;    Social History   Socioeconomic History   Marital status: Married    Spouse name: Not on file   Number of children: Not on file   Years of education: Not on file   Highest education level: Not on file  Occupational History   Not on file  Tobacco Use   Smoking status: Never   Smokeless tobacco: Never  Vaping Use   Vaping Use: Never used  Substance and Sexual Activity   Alcohol use: No   Drug use: No   Sexual activity: Not on file  Other Topics Concern   Not on file  Social History Narrative   Not on file   Social Determinants of Health   Financial Resource Strain: Not on file  Food Insecurity: No Food Insecurity   Worried About Running Out of Food in the Last Year: Never true   [place] in the Last Year: Never true  Transportation Needs: No Transportation Needs   Lack of Transportation (Medical): No   Lack of Transportation (Non-Medical): No  Physical Activity: Not on file  Stress: Not on file  Social Connections: Not on file  Intimate Partner Violence: Not on file    Family History  Problem Relation Age of Onset   Hypertension Mother    Diabetes type II Father    Parkinson's disease Father    Heart disease Father    No Known Allergies I? Current Outpatient Medications  Medication Sig Dispense Refill   acetaminophen (TYLENOL) 500 MG tablet Take 1 tablet (500 mg total) by mouth every 6 (six) hours as needed for mild pain (or Fever >/= 101). 30 tablet 0   albuterol (VENTOLIN HFA) 108 (90 Base) MCG/ACT inhaler Inhale 2 puffs into the lungs once every 6 (six) hours as needed for wheezing or shortness of breath. 6.7 g 0   amLODipine (NORVASC) 10 MG tablet Take 1 tablet (10 mg total) by mouth once daily. 30 tablet 2   amoxicillin-clavulanate (AUGMENTIN) 875-125 MG tablet TAKE ONE TABLET BY  MOUTH TWICE DAILY WITH A MEAL FOR 7 DAYS. 14 tablet 0   Ascorbic Acid (VITAMIN C WITH ROSE HIPS) 500 MG tablet Take 500 mg by mouth daily.     gabapentin (NEURONTIN) 300 MG capsule Take 1 capsule (300 mg total) by mouth once daily at bedtime. 30 capsule 2   Insulin Glargine (BASAGLAR KWIKPEN) 100 UNIT/ML Inject 15 Units into the skin at bedtime. 15 mL 3   insulin lispro (HUMALOG KWIKPEN) 100 UNIT/ML KwikPen Inject 7 Units into the skin 3 (three) times daily before meals. 15 mL 3   Insulin Pen Needle 32G X 6 MM MISC USE AS DIRECTED WITH INSULIN PENS. 100 each 1   Multiple Vitamin (MULTIVITAMIN WITH MINERALS) TABS tablet Take 1 tablet by mouth once daily. 30 tablet 0  oxyCODONE-acetaminophen (PERCOCET) 5-325 MG tablet Take 1 tablet by mouth every 4 (four) hours as needed for severe pain. 30 tablet 0   pantoprazole (PROTONIX) 40 MG tablet Take 1 tablet (40 mg total) by mouth once daily. 30 tablet 2   sulfamethoxazole-trimethoprim (BACTRIM DS) 800-160 MG tablet TAKE ONE TABLET BY MOUTH 3 TIMES DAILY FOR 7 DAYS. 21 tablet 0   traMADol (ULTRAM) 50 MG tablet Take 1 tablet (50 mg total) by mouth every 6 (six) hours as needed for moderate pain. 30 tablet 0   No current facility-administered medications for this visit.     Abtx:  Anti-infectives (From admission, onward)    None       REVIEW OF SYSTEMS:  Const: negative fever, negative chills, negative weight loss Eyes: negative diplopia or visual changes, negative eye pain ENT: negative coryza, negative sore throat Resp: negative cough, hemoptysis, dyspnea Cards: negative for chest pain, palpitations, lower extremity edema GU: negative for frequency, dysuria and hematuria GI: Negative for abdominal pain, diarrhea, bleeding, constipation Skin: negative for rash and pruritus Heme: negative for easy bruising and gum/nose bleeding MS: Weakness Neurolo:negative for headaches, dizziness, vertigo, memory problems  Psych: negative for feelings of  anxiety, depression  Endocrine:  diabetes Allergy/Immunology- negative for any medication or food allergies  Objective:  VITALS:  BP (!) 149/80   Pulse 89   Resp 16   Ht 5\' 6"  (1.676 m)   Wt 199 lb 8 oz (90.5 kg)   SpO2 96%   BMI 32.20 kg/m  PHYSICAL EXAM:  General: Alert, cooperative, no distress, appears stated age.  Pale Head: Normocephalic, without obvious abnormality, atraumatic. Eyes: Conjunctivae clear, anicteric sclerae. Pupils are equal ENT Nares normal. No drainage or sinus tenderness. Edentulous Neck: Supple, symmetrical, no adenopathy, thyroid: non tender no carotid bruit and no JVD. Back: No CVA tenderness. Lungs: Clear to auscultation bilaterally. No Wheezing or Rhonchi. No rales. Heart: Regular rate and rhythm, no murmur, rub or gallop. Abdomen: Soft, non-tender,not distended. Bowel sounds normal. No masses Extremities: Right foot surgical site examined Sutures and staples have been removed.  The wound has healed well  02/10/2021     01/27/2021     skin: No rashes or lesions. Or bruising Lymph: Cervical, supraclavicular normal. Neurologic: Grossly non-focal  ? Impression/Recommendation Diabetic foot infection of the right foot.  Status post TMA on 12/13/2020 and revision Lisfranc amputation on 01/08/2021. Bone margins are negative for osteomyelitis during the last surgery Patient patient completed 4 weeks of p.o. Bactrim and Augmentin. Staples have been removed.  And the wound has healed well. He does not need any further antibiotics He will follow-up with the podiatrist as scheduled.   DM on insulin   Anemia  Discussed the management with the patient in great detail.  I will see him as needed.    ____________________ Note:  This document was prepared using Systems analyst and may include unintentional dictation errors.

## 2021-02-10 NOTE — Patient Instructions (Signed)
Rt foot TMA site has healed well- you dont need any furtehr antibiotics- you are discharged from my clinic- please follow up with podiatrist

## 2021-03-01 ENCOUNTER — Ambulatory Visit (INDEPENDENT_AMBULATORY_CARE_PROVIDER_SITE_OTHER): Payer: Medicaid Other | Admitting: Podiatry

## 2021-03-01 ENCOUNTER — Other Ambulatory Visit: Payer: Self-pay

## 2021-03-01 DIAGNOSIS — Z9889 Other specified postprocedural states: Secondary | ICD-10-CM

## 2021-03-01 NOTE — Progress Notes (Signed)
° °  Subjective:  Patient presents today status post right foot revisional Lisfranc amputation. DOS: 01/08/2021.  Overall the patient is doing very well.  He has been washing his foot as instructed.  He is also been weightbearing in the cam boot as instructed.  No new complaints at this time Past Medical History:  Diagnosis Date   Diabetes mellitus without complication ([place])    Neuropathy    Sarcoidosis    Sleep apnea       Objective/Physical Exam Neurovascular status intact.  Skin incisions appear to be well coapted with and completely healed with exception of the ends of the amputation stump.  On the medial and lateral end of the incision sites there is a small area of dehiscence about 1 cm in length.  Very similar in appearance.  Granular wound base which should heal without complication.  No sign of infectious process noted. No active bleeding noted.  Negative for any significant edema noted to the surgical extremity.  Overall the amputation stump looks like it is doing well and healing nicely   Assessment: 1. s/p Lisfranc amputation right foot. DOS: 01/08/2021 2.  Small area of dehiscence medial and lateral ends of the amputation stump both measuring about 1 cm in length   Plan of Care:  1. Patient was evaluated.  Light debridement of the superficial eschar along the amputation site was performed using a tissue nipper.  After debridement of the eschar to the ends of the amputation stump incision site there was some areas that have not quite healed as noted above 2.  Betadine ointment was provided for the patient to apply Betadine ointment to the small areas of dehiscence with a light Band-Aid daily.  The patient does have Kerlex wraps at home.  He may use these as well 3.  Continue weightbearing in the cam boot as tolerated 4.  Return to clinic in 3 weeks  Edrick Kins, DPM [facility]  Dr. Edrick Kins, DPM    [address], [place] [address]                Office [phone]  Fax [phone]

## 2021-03-16 ENCOUNTER — Other Ambulatory Visit: Payer: Medicaid Other

## 2021-03-16 ENCOUNTER — Other Ambulatory Visit: Payer: Self-pay

## 2021-03-16 ENCOUNTER — Emergency Department
Admission: EM | Admit: 2021-03-16 | Discharge: 2021-03-16 | Disposition: A | Discharge status: Home / Self Care | Payer: Medicaid Other | Attending: Emergency Medicine | Admitting: Emergency Medicine

## 2021-03-16 ENCOUNTER — Encounter: Payer: Self-pay | Admitting: Emergency Medicine

## 2021-03-16 DIAGNOSIS — L03312 Cellulitis of back [any part except buttock]: Secondary | ICD-10-CM | POA: Diagnosis not present

## 2021-03-16 DIAGNOSIS — T63301A Toxic effect of unspecified spider venom, accidental (unintentional), initial encounter: Secondary | ICD-10-CM | POA: Insufficient documentation

## 2021-03-16 DIAGNOSIS — W57XXXA Bitten or stung by nonvenomous insect and other nonvenomous arthropods, initial encounter: Secondary | ICD-10-CM | POA: Insufficient documentation

## 2021-03-16 MED ORDER — CEPHALEXIN 500 MG PO CAPS: 500.0000 mg | ORAL_CAPSULE | Freq: Three times a day (TID) | ORAL | 0 refills | Status: DC

## 2021-03-16 NOTE — ED Provider Notes (Signed)
[facility] Provider Note    Event Date/Time   First MD Initiated Contact with Patient 03/16/21 0732     (approximate)   History   Insect Bite   HPI  Fernando Vega is a 51 y.o. male   resents to the ED with complaint of a possible spider bite to his back.  Patient states it started a couple of days and he has been using over-the-counter anti-itch cream.  Patient denies any other symptoms.  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  He has a history of diabetes type 2 with peripheral neuropathy and hypertension.  Currently rates pain as 10/10.      Physical Exam   Triage Vital Signs: ED Triage Vitals [03/16/21 0615]  Enc Vitals Group     BP      Pulse      Resp      Temp      Temp src      SpO2      Weight 189 lb (85.7 kg)     Height 5\' 6"  (1.676 m)     Head Circumference      Peak Flow      Pain Score 10     Pain Loc      Pain Edu?      Excl. in [place]?     Most recent vital signs: Vitals:   03/16/21 0739  BP: 138/82  Pulse: 83  Resp: 18  Temp: 98.1 F (36.7 C)  SpO2: 96%     General: Awake, no distress.  Patient is ambulatory without any assistance wearing a cam walker CV:  Good peripheral perfusion.  Heart regular rate and rhythm without murmur. Resp:  Normal effort.  Lungs are clear and patient is able to talk in complete sentences without any difficulty. Abd:  No distention.  Other:  Left upper posterior shoulder has a single papule that appears to have been opened either by scratching or on its own.  There is 1 cm diameter erythema surrounding this area.  No point tenderness on palpation.  Area is slightly warm.  No active drainage.   ED Results / Procedures / Treatments   Labs (all labs ordered are listed, but only abnormal results are displayed) Labs Reviewed - No data to display   Procedures   MEDICATIONS ORDERED IN ED: Medications - No data to  display   IMPRESSION / MDM / [place] / ED COURSE  I reviewed the triage vital signs and the nursing notes.                              Differential diagnosis includes, but is not limited to, abscess, cellulitis, insect bite, allergic reaction localized.  51 year old male presents to the ED with complaint of a area causing him itching to his left posterior shoulder.  Patient has been using cream to the area that he purchased over-the-counter.  He states this has not helped.  Patient does have a history of diabetes type 2.  He reports that he "thinks a spider bit him" but did not actually see it.  Patient denies any fever, chills, nausea or vomiting.  Area appears to be a single papule that has been unroofed most likely from scratching.  There is a 0.5 cm area of erythema surrounding it.  Area is warm but nontender.  Patient was  started on Keflex 500 mg 3 times daily for 7 days.  He is encouraged to use warm moist compresses to the area frequently and he may continue with his over-the-counter cream if needed for itching.  He is encouraged to follow-up with his PCP in [facility].   FINAL CLINICAL IMPRESSION(S) / ED DIAGNOSES   Final diagnoses:  Cellulitis of back except buttock     Rx / DC Orders   ED Discharge Orders          Ordered    cephALEXin (KEFLEX) 500 MG capsule  3 times daily        03/16/21 0740             Note:  This document was prepared using Dragon voice recognition software and may include unintentional dictation errors.   [name], PA-C 03/16/21 0818    Lavonia Drafts, MD 03/16/21 [phone]

## 2021-03-16 NOTE — Discharge Instructions (Signed)
Discontinue using the itch cream that she bought at [employer].  Begin taking the antibiotic that was sent to the pharmacy.  You may also put warm moist compresses to the area frequently.  Follow-up with your primary care provider if any continued problems or not improving.

## 2021-03-16 NOTE — ED Triage Notes (Signed)
Patient ambulatory to triage with steady gait, without difficulty or distress noted; pt reports 2 days ago felt a "bite or sting"; small area of redness to left scapula

## 2021-03-22 ENCOUNTER — Ambulatory Visit (INDEPENDENT_AMBULATORY_CARE_PROVIDER_SITE_OTHER): Payer: Medicaid Other

## 2021-03-22 ENCOUNTER — Other Ambulatory Visit: Payer: Self-pay

## 2021-03-22 ENCOUNTER — Encounter: Payer: Self-pay | Admitting: Podiatry

## 2021-03-22 ENCOUNTER — Ambulatory Visit (INDEPENDENT_AMBULATORY_CARE_PROVIDER_SITE_OTHER): Payer: Medicaid Other | Admitting: Podiatry

## 2021-03-22 DIAGNOSIS — Z9889 Other specified postprocedural states: Secondary | ICD-10-CM | POA: Diagnosis not present

## 2021-03-22 DIAGNOSIS — Z89431 Acquired absence of right foot: Secondary | ICD-10-CM

## 2021-03-23 ENCOUNTER — Ambulatory Visit: Payer: Medicaid Other | Admitting: Gerontology

## 2021-03-30 NOTE — Progress Notes (Signed)
° °  Subjective:  Patient presents today status post right foot revisional Lisfranc amputation. DOS: 01/08/2021.  Overall the patient is doing very well.  He has been washing his foot as instructed.  He is also been weightbearing in the cam boot as instructed.  No new complaints at this time Past Medical History:  Diagnosis Date   Diabetes mellitus without complication ([place])    Neuropathy    Sarcoidosis    Sleep apnea          Objective/Physical Exam Neurovascular status intact.  Skin incisions appear to be well coapted and healed.  There is no erythema or edema around the amputation stump.  Well-healing surgical foot  Radiographic exam Disarticulation of the tarsometatarsal joint.  Absence of all metatarsals noted.  No osseous erosion or cortical irregularities that would be concerning for osteomyelitis.  Assessment: 1. s/p Lisfranc amputation right foot. DOS: 01/08/2021   Plan of Care:  1. Patient was evaluated.  The skin incision has healed nicely.  There is currently no open wound or clinical evidence of infection 3.  The patient may now resume full activity no restrictions.  Recommend good supportive shoes and sneakers. 4.  Unfortunately Medicaid does not cover diabetic shoes or toe fillers.  Recommend going to the shoe market on [address] in [place] for shoes. 5.  Return to clinic 6 months  *Goes by Lenise Herald, DPM [facility]  Dr. Edrick Kins, DPM    [address], [place] [address]                Office [phone]  Fax [phone]

## 2021-04-20 ENCOUNTER — Other Ambulatory Visit: Payer: Self-pay

## 2021-04-20 ENCOUNTER — Ambulatory Visit (INDEPENDENT_AMBULATORY_CARE_PROVIDER_SITE_OTHER): Payer: Medicaid Other | Admitting: Podiatry

## 2021-04-20 DIAGNOSIS — S90852A Superficial foreign body, left foot, initial encounter: Secondary | ICD-10-CM | POA: Diagnosis not present

## 2021-04-21 NOTE — Progress Notes (Signed)
°  Subjective:  Patient ID: Fernando Vega, male    DOB: [birth year],  MRN: [id number]  Chief Complaint  Patient presents with   Foot Pain    foreign object in L foot, belives it's a splinter     51 y.o. male presents with the above complaint. History confirmed with patient.  Returns today for a new issue his right foot is doing well and is well-healed.  His left foot he thinks he may have stubbed it on the hardwood floors and caught a splinter.  Objective:  Physical Exam: warm, good capillary refill, no trophic changes or ulcerative lesions, normal DP and PT pulses, and small puncture wound with small piece of wood in the superficial portions of the epidermis submetatarsal 1 there is no signs of infection purulence or abscess formation. Assessment:   1. Foreign body in left foot, initial encounter      Plan:  Patient was evaluated and treated and all questions answered.  Inspected the small puncture wound.  There was a small piece of splinter that I was able to easily removed without anesthesia after debridement with a chisel blade.  Did not require anesthesia.  There were no signs of infection or deep wound or retained foreign body.  Advised on importance of daily foot inspection which she is good about doing.  I will see him back as needed for other issues  Return if symptoms worsen or fail to improve.

## 2021-04-24 ENCOUNTER — Emergency Department
Admission: EM | Admit: 2021-04-24 | Discharge: 2021-04-24 | Disposition: A | Discharge status: Home / Self Care | Payer: Medicaid Other | Attending: Emergency Medicine | Admitting: Emergency Medicine

## 2021-04-24 ENCOUNTER — Other Ambulatory Visit: Payer: Self-pay

## 2021-04-24 DIAGNOSIS — L03312 Cellulitis of back [any part except buttock]: Secondary | ICD-10-CM

## 2021-04-24 DIAGNOSIS — Z794 Long term (current) use of insulin: Secondary | ICD-10-CM | POA: Insufficient documentation

## 2021-04-24 DIAGNOSIS — E119 Type 2 diabetes mellitus without complications: Secondary | ICD-10-CM | POA: Insufficient documentation

## 2021-04-24 DIAGNOSIS — I10 Essential (primary) hypertension: Secondary | ICD-10-CM | POA: Diagnosis not present

## 2021-04-24 DIAGNOSIS — L539 Erythematous condition, unspecified: Secondary | ICD-10-CM | POA: Diagnosis present

## 2021-04-24 LAB — BASIC METABOLIC PANEL
Anion gap: 9 (ref 5–15)
BUN: 26 mg/dL — ABNORMAL HIGH (ref 6–20)
CO2: 20 mmol/L — ABNORMAL LOW (ref 22–32)
Calcium: 8.7 mg/dL — ABNORMAL LOW (ref 8.9–10.3)
Chloride: 105 mmol/L (ref 98–111)
Creatinine, Ser: 0.85 mg/dL (ref 0.61–1.24)
GFR, Estimated: >60 mL/min (ref >60)
Glucose, Bld: 222 mg/dL — ABNORMAL HIGH (ref 70–99)
Potassium: 4.4 mmol/L (ref 3.5–5.1)
Sodium: 134 mmol/L — ABNORMAL LOW (ref 135–145)

## 2021-04-24 LAB — CBC
HCT: 47.3 % (ref 39.0–52.0)
Hemoglobin: 14.9 g/dL (ref 13.0–17.0)
MCH: 27.4 pg (ref 26.0–34.0)
MCHC: 31.5 g/dL (ref 30.0–36.0)
MCV: 86.9 fL (ref 80.0–100.0)
Platelets: 296 10*3/uL (ref 150–400)
RBC: 5.44 MIL/uL (ref 4.22–5.81)
RDW: 14.6 % (ref 11.5–15.5)
WBC: 10.9 10*3/uL — ABNORMAL HIGH (ref 4.0–10.5)
nRBC: 0 % (ref 0.0–0.2)

## 2021-04-24 MED ORDER — SULFAMETHOXAZOLE-TRIMETHOPRIM 800-160 MG PO TABS: 1.0000 | ORAL_TABLET | Freq: Two times a day (BID) | ORAL | 0 refills | Status: AC

## 2021-04-24 MED ORDER — CEPHALEXIN 500 MG PO CAPS: 500.0000 mg | ORAL_CAPSULE | Freq: Three times a day (TID) | ORAL | 0 refills | Status: AC

## 2021-04-24 NOTE — ED Provider Notes (Signed)
[facility] Provider Note    Event Date/Time   First MD Initiated Contact with Patient 04/24/21 [phone]     (approximate)   History   Spider bite/back wound  HPI  Fernando Vega is a 51 y.o. male with a history of hypertension, insulin-dependent diabetes who comes the ED complaining of a painful wound on the left upper back that has been there for a week.  He has noticed a lot of spiders around the house and suspects that this is from a spider bite.  Denies any fever chills abdominal pain chest pain shortness of breath cough vomiting or neurologic symptoms.  Pain is constant nonradiating, worse with pressure on the area, no alleviating factors.     Physical Exam   Triage Vital Signs: ED Triage Vitals  Enc Vitals Group     BP 04/24/21 0749 111/78     Pulse Rate 04/24/21 0749 95     Resp 04/24/21 0749 18     Temp 04/24/21 0749 97.8 F (36.6 C)     Temp src --      SpO2 04/24/21 0749 97 %     Weight 04/24/21 0750 189 lb (85.7 kg)     Height 04/24/21 0750 5\' 7"  (1.702 m)     Head Circumference --      Peak Flow --      Pain Score 04/24/21 0750 7     Pain Loc --      Pain Edu? --      Excl. in [place]? --     Most recent vital signs: Vitals:   04/24/21 0749  BP: 111/78  Pulse: 95  Resp: 18  Temp: 97.8 F (36.6 C)  SpO2: 97%     General: Awake, no distress.  CV:  Good peripheral perfusion.  Regular rate and rhythm Resp:  Normal effort.  Clear to auscultation bilaterally Abd:  No distention.  Soft nontender Other:  Back shows a 4 cm area of induration with a small focus of central necrosis.  No crepitus, no fluctuance, no purulent drainage.  The area is erythematous and tender to the touch.   ED Results / Procedures / Treatments   Labs (all labs ordered are listed, but only abnormal results are displayed) Labs Reviewed  CBC - Abnormal; Notable for the following components:      Result Value   WBC 10.9 (*)    All other components within  normal limits  BASIC METABOLIC PANEL - Abnormal; Notable for the following components:   Sodium 134 (*)    CO2 20 (*)    Glucose, Bld 222 (*)    BUN 26 (*)    Calcium 8.7 (*)    All other components within normal limits     EKG     RADIOLOGY     PROCEDURES:  Critical Care performed: No  Procedures   MEDICATIONS ORDERED IN ED: Medications - No data to display   IMPRESSION / MDM / [place] / ED COURSE  I reviewed the triage vital signs and the nursing notes.                                  Patient presents with an area of cellulitis on the left upper back.  No abscess, no necrotizing fasciitis or evidence of deeper space infection.  Patient is nontoxic with normal vital signs.  Wound is consistent with  a spider bite or possibly MRSA.  Will cover with Keflex and Bactrim.  Not require edition due to reassuring labs with no leukocytosis, normal creatinine, no severe features on exam.  Stable for discharge.     FINAL CLINICAL IMPRESSION(S) / ED DIAGNOSES   Final diagnoses:  Cellulitis of back except buttock  Type 2 diabetes mellitus without complication, with long-term current use of insulin (HCC)     Rx / DC Orders   ED Discharge Orders          Ordered    sulfamethoxazole-trimethoprim (BACTRIM DS) 800-160 MG tablet  2 times daily        04/24/21 0835    cephALEXin (KEFLEX) 500 MG capsule  3 times daily        04/24/21 8592             Note:  This document was prepared using Dragon voice recognition software and may include unintentional dictation errors.   Carrie Mew, MD 04/24/21 [phone]

## 2021-04-24 NOTE — ED Triage Notes (Signed)
Pt reports insect bite to back for 1 week to upper left back. Reports pain and itching. "Bad case of spiders around the house". Redness and swelling noted to upper left back

## 2021-04-24 NOTE — Discharge Instructions (Signed)
Continue taking all of your home medications as normally prescribed and monitoring your blood sugar.  Take Keflex and Bactrim as prescribed to resolve the skin infection on your back.

## 2021-05-25 ENCOUNTER — Other Ambulatory Visit: Payer: Self-pay

## 2021-05-25 ENCOUNTER — Emergency Department
Admission: EM | Admit: 2021-05-25 | Discharge: 2021-05-25 | Disposition: A | Discharge status: Home / Self Care | Payer: Medicaid Other | Attending: Emergency Medicine | Admitting: Emergency Medicine

## 2021-05-25 DIAGNOSIS — Z794 Long term (current) use of insulin: Secondary | ICD-10-CM | POA: Insufficient documentation

## 2021-05-25 DIAGNOSIS — W57XXXA Bitten or stung by nonvenomous insect and other nonvenomous arthropods, initial encounter: Secondary | ICD-10-CM | POA: Diagnosis not present

## 2021-05-25 DIAGNOSIS — E119 Type 2 diabetes mellitus without complications: Secondary | ICD-10-CM | POA: Insufficient documentation

## 2021-05-25 DIAGNOSIS — S31050A Open bite of lower back and pelvis without penetration into retroperitoneum, initial encounter: Secondary | ICD-10-CM | POA: Diagnosis not present

## 2021-05-25 DIAGNOSIS — S3992XA Unspecified injury of lower back, initial encounter: Secondary | ICD-10-CM | POA: Diagnosis present

## 2021-05-25 DIAGNOSIS — L03818 Cellulitis of other sites: Secondary | ICD-10-CM | POA: Insufficient documentation

## 2021-05-25 DIAGNOSIS — I10 Essential (primary) hypertension: Secondary | ICD-10-CM | POA: Diagnosis not present

## 2021-05-25 MED ORDER — SULFAMETHOXAZOLE-TRIMETHOPRIM 800-160 MG PO TABS
1.0000 | ORAL_TABLET | Freq: Two times a day (BID) | ORAL | 0 refills | Status: DC
  Filled 2021-05-25: qty 14, 7d supply, fill #0

## 2021-05-25 MED ORDER — HYDROXYZINE PAMOATE 25 MG PO CAPS
25.0000 mg | ORAL_CAPSULE | Freq: Three times a day (TID) | ORAL | 0 refills | Status: AC | PRN
Start: 2021-05-25 — End: 2021-05-30

## 2021-05-25 MED ORDER — HYDROXYZINE PAMOATE 25 MG PO CAPS
25.0000 mg | ORAL_CAPSULE | Freq: Three times a day (TID) | ORAL | 0 refills | Status: DC | PRN
  Filled 2021-05-25: qty 15, 5d supply, fill #0

## 2021-05-25 MED ORDER — SULFAMETHOXAZOLE-TRIMETHOPRIM 800-160 MG PO TABS
1.0000 | ORAL_TABLET | Freq: Two times a day (BID) | ORAL | 0 refills | Status: AC
Start: 2021-05-25 — End: 2021-06-01

## 2021-05-25 MED ORDER — CEPHALEXIN 500 MG PO CAPS
500.0000 mg | ORAL_CAPSULE | Freq: Two times a day (BID) | ORAL | 0 refills | Status: DC
  Filled 2021-05-25: qty 14, 7d supply, fill #0

## 2021-05-25 MED ORDER — CEPHALEXIN 500 MG PO CAPS: 500.0000 mg | ORAL_CAPSULE | Freq: Two times a day (BID) | ORAL | 0 refills | Status: AC

## 2021-05-25 NOTE — Discharge Instructions (Addendum)
take antibiotics for concerns for infection use the hydroxyzine to help with itching but do not drive or work while on this because it can make you sleepy and return to the ER if you develop fevers, worsening redness or any other concerns' ? ?Call an insect inspector to evaluate your home.  ?

## 2021-05-25 NOTE — ED Triage Notes (Addendum)
Pt comes with c/o possible insect spider bites to back. Pt states this started 3 days ago and it is big bites. ? ?Pt has several large wounds noted to back with redness and yellow discoloration to site. ?

## 2021-05-25 NOTE — ED Provider Notes (Addendum)
? ?[facility] ?Provider Note ? ? ? Event Date/Time  ? First MD Initiated Contact with Patient 05/25/21 1742   ?  (approximate) ? ? ?History  ? ?Insect Bite ? ? ?HPI ? ?Fernando Vega is a 51 y.o. male with hypertension, insulin-dependent diabetes who comes in for upper back wounds.  Patient reports that he checks his sugars multiple times a day and his sugar just prior to eating was in the 150 range.  He denies any issues with this.  He reports that he has been here previously for these bug bites and that they get better with the antibiotics but that he gets but again and then they come back.  He reports recently cleaning out all of his sheets.  He denies any issues with his left foot wound.  Denies any fevers.  Reports otherwise feeling well.  Does report some itching of the wounds. ? ?I reviewed the records where patient was seen on 2/12 for similar.  Patient was placed on Bactrim and Keflex.  He had a creatinine done at that time that was normal. ? ?I reviewed a note from infectious disease where patient was being seen for a diabetic foot infection on 02/10/2021 at that time patient had undergone amputation and the cultures had grown Staph aureus so he was on 6 weeks of IV cefazolin and then later had reinfection and had to have further amputation and was discharged on 4 weeks of Bactrim, Augmentin. ? ? ?Triage Vital Signs: ?ED Triage Vitals  ?Enc Vitals Group  ?   BP 05/25/21 1615 106/68  ?   Pulse Rate 05/25/21 1615 93  ?   Resp 05/25/21 1615 18  ?   Temp 05/25/21 1615 98 ?F (36.7 ?C)  ?   Temp src --   ?   SpO2 05/25/21 1615 97 %  ?   Weight --   ?   Height --   ?   Head Circumference --   ?   Peak Flow --   ?   Pain Score 05/25/21 1614 4  ?   Pain Loc --   ?   Pain Edu? --   ?   Excl. in [place]? --   ? ? ?Most recent vital signs: ?Vitals:  ? 05/25/21 1615  ?BP: 106/68  ?Pulse: 93  ?Resp: 18  ?Temp: 98 ?F (36.7 ?C)  ?SpO2: 97%  ? ? ? ?General: Awake, no distress.  ?CV:  Good peripheral  perfusion.  ?Resp:  Normal effort. ?Abd:  No distention.  ?Other:  Multiple scattered lesions with surrounding cellulitis none that are greater than the size of a golf ball with a little bit of crusting noted on top of them but no fluctuation noted ?Right foot was inspected without any erythema or issues.  No cellulitis that is not associated with a bite mark ? ? ?ED Results / Procedures / Treatments  ? ? ? ?IMPRESSION / MDM / ASSESSMENT AND PLAN / ED COURSE  ?I reviewed the triage vital signs and the nursing notes. ?             ?               ? ?Differential diagnosis includes, but is not limited to, suspect insect bite, spider bite with concurrent cellulitis.  No fluctuation that would be amenable to I&D.  Patient reports getting better with antibiotics previously.  Recent BMP shows normal creatinine so we will do another trial of Keflex  and Bactrim.  We will give some hydroxyzine to help with itching. Recommended not to drive on this.  Recommended getting a Engineer, petroleum to house to spray and figure out why he keeps getting there.  Patient is well-appearing without evidence of sepsis.  He understands return if he develops fevers or worsening symptoms ? ? ?FINAL CLINICAL IMPRESSION(S) / ED DIAGNOSES  ? ?Final diagnoses:  ?Insect bite of lower back, initial encounter  ?Cellulitis of other specified site  ? ? ? ?Rx / DC Orders  ? ?ED Discharge Orders   ? ?      Ordered  ?  sulfamethoxazole-trimethoprim (BACTRIM DS) 800-160 MG tablet  2 times daily       ? 05/25/21 1809  ?  cephALEXin (KEFLEX) 500 MG capsule  2 times daily       ? 05/25/21 1809  ?  hydrOXYzine (VISTARIL) 25 MG capsule  3 times daily PRN       ? 05/25/21 1809  ? ?  ?  ? ?  ? ? ? ?Note:  This document was prepared using Dragon voice recognition software and may include unintentional dictation errors. ?  ?Vanessa [place], MD ?05/25/21 1810 ? ?  ?Vanessa Coates, MD ?05/25/21 1810 ? ?

## 2021-05-26 ENCOUNTER — Other Ambulatory Visit: Payer: Self-pay

## 2021-06-02 ENCOUNTER — Telehealth: Payer: Self-pay

## 2021-06-02 NOTE — Telephone Encounter (Signed)
Called no answer no voicemail

## 2021-06-03 ENCOUNTER — Telehealth: Payer: Self-pay

## 2021-06-03 NOTE — Telephone Encounter (Signed)
CALLED NO ANSWER NO VOICEMAIL LETTER SENT ?

## 2021-06-10 NOTE — Telephone Encounter (Signed)
Pt received letter about procedure setting an appointment. Left message with another no# he can be reached [phone] or [phone] ?

## 2021-06-20 ENCOUNTER — Other Ambulatory Visit: Payer: Self-pay

## 2021-06-20 ENCOUNTER — Observation Stay
Admission: EM | Admit: 2021-06-20 | Discharge: 2021-06-21 | Disposition: A | Discharge status: Home / Self Care | Payer: Medicaid Other | Attending: Internal Medicine | Admitting: Internal Medicine

## 2021-06-20 DIAGNOSIS — I10 Essential (primary) hypertension: Secondary | ICD-10-CM | POA: Diagnosis not present

## 2021-06-20 DIAGNOSIS — R739 Hyperglycemia, unspecified: Secondary | ICD-10-CM

## 2021-06-20 DIAGNOSIS — I739 Peripheral vascular disease, unspecified: Secondary | ICD-10-CM | POA: Diagnosis not present

## 2021-06-20 DIAGNOSIS — Z7901 Long term (current) use of anticoagulants: Secondary | ICD-10-CM | POA: Diagnosis not present

## 2021-06-20 DIAGNOSIS — L03312 Cellulitis of back [any part except buttock]: Principal | ICD-10-CM | POA: Insufficient documentation

## 2021-06-20 DIAGNOSIS — E1142 Type 2 diabetes mellitus with diabetic polyneuropathy: Secondary | ICD-10-CM | POA: Diagnosis present

## 2021-06-20 DIAGNOSIS — E1165 Type 2 diabetes mellitus with hyperglycemia: Secondary | ICD-10-CM | POA: Insufficient documentation

## 2021-06-20 DIAGNOSIS — Z794 Long term (current) use of insulin: Secondary | ICD-10-CM | POA: Insufficient documentation

## 2021-06-20 DIAGNOSIS — L0390 Cellulitis, unspecified: Secondary | ICD-10-CM | POA: Diagnosis present

## 2021-06-20 DIAGNOSIS — L02212 Cutaneous abscess of back [any part, except buttock]: Secondary | ICD-10-CM | POA: Diagnosis not present

## 2021-06-20 DIAGNOSIS — L03818 Cellulitis of other sites: Secondary | ICD-10-CM

## 2021-06-20 DIAGNOSIS — Z79899 Other long term (current) drug therapy: Secondary | ICD-10-CM | POA: Diagnosis not present

## 2021-06-20 LAB — CBC WITH DIFFERENTIAL/PLATELET
Abs Immature Granulocytes: 0.04 10*3/uL (ref 0.00–0.07)
Basophils Absolute: 0 10*3/uL (ref 0.0–0.1)
Basophils Relative: 0 %
Eosinophils Absolute: 0.1 10*3/uL (ref 0.0–0.5)
Eosinophils Relative: 1 %
HCT: 43.6 % (ref 39.0–52.0)
Hemoglobin: 14.2 g/dL (ref 13.0–17.0)
Immature Granulocytes: 0 %
Lymphocytes Relative: 19 %
Lymphs Abs: 2.4 10*3/uL (ref 0.7–4.0)
MCH: 29 pg (ref 26.0–34.0)
MCHC: 32.6 g/dL (ref 30.0–36.0)
MCV: 89.2 fL (ref 80.0–100.0)
Monocytes Absolute: 1.4 10*3/uL — ABNORMAL HIGH (ref 0.1–1.0)
Monocytes Relative: 11 %
Neutro Abs: 8.8 10*3/uL — ABNORMAL HIGH (ref 1.7–7.7)
Neutrophils Relative %: 69 %
Platelets: 258 10*3/uL (ref 150–400)
RBC: 4.89 MIL/uL (ref 4.22–5.81)
RDW: 12.8 % (ref 11.5–15.5)
WBC: 12.7 10*3/uL — ABNORMAL HIGH (ref 4.0–10.5)
nRBC: 0 % (ref 0.0–0.2)

## 2021-06-20 LAB — HEMOGLOBIN A1C
Hgb A1c MFr Bld: 7.5 % — ABNORMAL HIGH (ref 4.8–5.6)
Mean Plasma Glucose: 168.55 mg/dL

## 2021-06-20 LAB — COMPREHENSIVE METABOLIC PANEL
ALT: 18 U/L (ref 0–44)
AST: 16 U/L (ref 15–41)
Albumin: 3.5 g/dL (ref 3.5–5.0)
Alkaline Phosphatase: 88 U/L (ref 38–126)
Anion gap: 8 (ref 5–15)
BUN: 23 mg/dL — ABNORMAL HIGH (ref 6–20)
CO2: 28 mmol/L (ref 22–32)
Calcium: 8.4 mg/dL — ABNORMAL LOW (ref 8.9–10.3)
Chloride: 100 mmol/L (ref 98–111)
Creatinine, Ser: 1.01 mg/dL (ref 0.61–1.24)
GFR, Estimated: >60 mL/min (ref >60)
Glucose, Bld: 276 mg/dL — ABNORMAL HIGH (ref 70–99)
Potassium: 4.1 mmol/L (ref 3.5–5.1)
Sodium: 136 mmol/L (ref 135–145)
Total Bilirubin: 1.2 mg/dL (ref 0.3–1.2)
Total Protein: 6.8 g/dL (ref 6.5–8.1)

## 2021-06-20 LAB — GLUCOSE, CAPILLARY
Glucose-Capillary: 112 mg/dL — ABNORMAL HIGH (ref 70–99)
Glucose-Capillary: 197 mg/dL — ABNORMAL HIGH (ref 70–99)

## 2021-06-20 LAB — CBG MONITORING, ED: Glucose-Capillary: 164 mg/dL — ABNORMAL HIGH (ref 70–99)

## 2021-06-20 MED ORDER — CEFTRIAXONE SODIUM 1 G IJ SOLR
1.0000 g | Freq: Once | INTRAMUSCULAR | Status: AC
  Administered 2021-06-20: 1 g via INTRAVENOUS
  Filled 2021-06-20: qty 10

## 2021-06-20 MED ORDER — ENOXAPARIN SODIUM 40 MG/0.4ML IJ SOSY
40.0000 mg | PREFILLED_SYRINGE | Freq: Every day | INTRAMUSCULAR | Status: DC
  Administered 2021-06-20: 40 mg via SUBCUTANEOUS
  Filled 2021-06-20: qty 0.4

## 2021-06-20 MED ORDER — GABAPENTIN 300 MG PO CAPS
300.0000 mg | ORAL_CAPSULE | Freq: Every day | ORAL | Status: DC
  Administered 2021-06-20: 300 mg via ORAL
  Filled 2021-06-20: qty 1

## 2021-06-20 MED ORDER — PANTOPRAZOLE SODIUM 40 MG PO TBEC
40.0000 mg | DELAYED_RELEASE_TABLET | Freq: Every day | ORAL | Status: DC
  Administered 2021-06-20 – 2021-06-21 (×2): 40 mg via ORAL
  Filled 2021-06-20 (×2): qty 1

## 2021-06-20 MED ORDER — LIDOCAINE-PRILOCAINE 2.5-2.5 % EX CREA
TOPICAL_CREAM | Freq: Once | CUTANEOUS | Status: AC
  Filled 2021-06-20: qty 5

## 2021-06-20 MED ORDER — SODIUM CHLORIDE 0.9 % IV SOLN
1.0000 g | INTRAVENOUS | Status: DC
  Administered 2021-06-21: 1 g via INTRAVENOUS
  Filled 2021-06-20: qty 1

## 2021-06-20 MED ORDER — OXYCODONE-ACETAMINOPHEN 5-325 MG PO TABS
1.0000 | ORAL_TABLET | Freq: Once | ORAL | Status: AC
  Administered 2021-06-20: 1 via ORAL
  Filled 2021-06-20: qty 1

## 2021-06-20 MED ORDER — ONDANSETRON HCL 4 MG/2ML IJ SOLN
4.0000 mg | Freq: Four times a day (QID) | INTRAMUSCULAR | Status: DC | PRN
Start: 2021-06-20 — End: 2021-06-21

## 2021-06-20 MED ORDER — LIDOCAINE HCL (PF) 1 % IJ SOLN
5.0000 mL | Freq: Once | INTRAMUSCULAR | Status: AC
  Administered 2021-06-20: 5 mL via INTRADERMAL
  Filled 2021-06-20: qty 5

## 2021-06-20 MED ORDER — INSULIN ASPART 100 UNIT/ML IJ SOLN
0.0000 [IU] | Freq: Three times a day (TID) | INTRAMUSCULAR | Status: DC
  Administered 2021-06-21 (×2): 5 [IU] via SUBCUTANEOUS
  Filled 2021-06-20 (×2): qty 1

## 2021-06-20 MED ORDER — AMLODIPINE BESYLATE 10 MG PO TABS
10.0000 mg | ORAL_TABLET | Freq: Every day | ORAL | Status: DC
  Administered 2021-06-20 – 2021-06-21 (×2): 10 mg via ORAL
  Filled 2021-06-20: qty 2
  Filled 2021-06-20: qty 1

## 2021-06-20 MED ORDER — ONDANSETRON HCL 4 MG PO TABS: 4.0000 mg | ORAL_TABLET | Freq: Four times a day (QID) | ORAL | Status: DC | PRN

## 2021-06-20 MED ORDER — INSULIN ASPART 100 UNIT/ML IJ SOLN
8.0000 [IU] | Freq: Once | INTRAMUSCULAR | Status: AC
  Administered 2021-06-20: 8 [IU] via SUBCUTANEOUS
  Filled 2021-06-20: qty 1

## 2021-06-20 MED ORDER — VANCOMYCIN HCL 2000 MG/400ML IV SOLN
2000.0000 mg | Freq: Once | INTRAVENOUS | Status: AC
  Administered 2021-06-20: 2000 mg via INTRAVENOUS
  Filled 2021-06-20: qty 400

## 2021-06-20 MED ORDER — SODIUM CHLORIDE 0.9 % IV BOLUS
500.0000 mL | Freq: Once | INTRAVENOUS | Status: AC
  Administered 2021-06-20: 500 mL via INTRAVENOUS

## 2021-06-20 MED ORDER — INSULIN GLARGINE-YFGN 100 UNIT/ML ~~LOC~~ SOLN
15.0000 [IU] | Freq: Every day | SUBCUTANEOUS | Status: DC
  Administered 2021-06-20: 15 [IU] via SUBCUTANEOUS
  Filled 2021-06-20 (×2): qty 0.15

## 2021-06-20 MED ORDER — ACETAMINOPHEN 500 MG PO TABS
500.0000 mg | ORAL_TABLET | Freq: Four times a day (QID) | ORAL | Status: DC | PRN
  Administered 2021-06-21: 500 mg via ORAL
  Filled 2021-06-20: qty 1

## 2021-06-20 MED ORDER — HYDROCODONE-ACETAMINOPHEN 5-325 MG PO TABS
1.0000 | ORAL_TABLET | Freq: Four times a day (QID) | ORAL | Status: DC | PRN
  Administered 2021-06-20 – 2021-06-21 (×3): 1 via ORAL
  Filled 2021-06-20 (×3): qty 1

## 2021-06-20 MED ORDER — ATORVASTATIN CALCIUM 20 MG PO TABS
20.0000 mg | ORAL_TABLET | Freq: Every day | ORAL | Status: DC
Start: 2021-06-20 — End: 2021-06-21
  Administered 2021-06-20 – 2021-06-21 (×2): 20 mg via ORAL
  Filled 2021-06-20 (×2): qty 1

## 2021-06-20 NOTE — ED Provider Notes (Signed)
? ?[facility] ?Provider Note ? ? ? Event Date/Time  ? First MD Initiated Contact with Patient 06/20/21 [phone]   ?  (approximate) ? ? ?History  ? ?Abscess ? ? ?HPI ? ?Fernando Vega is a 51 y.o. male history of diabetes recurrent cellulitis abscess presents to the ER for evaluation of recurrent cellulitis of his left posterior back where he had a tattoo performed.  A tattoo was placed long time ago but states has had recurrent issues with infection at that area.  Thinks that they are spider bites.  Was seen in the last month for similar symptoms was put on antibiotics with no significant improvement.    Thinks that his blood sugars have been running "high."  But not sure how high. ?  ? ? ?Physical Exam  ? ?Triage Vital Signs: ?ED Triage Vitals  ?Enc Vitals Group  ?   BP 06/20/21 0831 112/72  ?   Pulse Rate 06/20/21 0831 94  ?   Resp 06/20/21 0831 17  ?   Temp 06/20/21 0831 97.8 ?F (36.6 ?C)  ?   Temp Source 06/20/21 0831 Oral  ?   SpO2 06/20/21 0831 99 %  ?   Weight --   ?   Height --   ?   Head Circumference --   ?   Peak Flow --   ?   Pain Score 06/20/21 0830 10  ?   Pain Loc --   ?   Pain Edu? --   ?   Excl. in [place]? --   ? ? ?Most recent vital signs: ?Vitals:  ? 06/20/21 1000 06/20/21 1030  ?BP: 138/76 (!) 143/94  ?Pulse: 92 90  ?Resp: 18 20  ?Temp:    ?SpO2: 98% 97%  ? ? ? ?Constitutional: Alert  ?Eyes: Conjunctivae are normal.  ?Head: Atraumatic. ?Nose: No congestion/rhinnorhea. ?Mouth/Throat: Mucous membranes are moist.   ?Neck: Painless ROM.  ?Cardiovascular:   Good peripheral circulation. ?Respiratory: Normal respiratory effort.  No retractions.  ?Gastrointestinal: Soft and nontender.  ?Musculoskeletal:  no deformity ?Neurologic:  MAE spontaneously. No gross focal neurologic deficits are appreciated.  ?Skin:  Skin is warm, dry and intact.,  Area of cellulitis and induration of the left posterior upper back roughly the size of the patient's palm, no crepitus.  Overlying warmth and  tenderness. ?Psychiatric: Mood and affect are normal. Speech and behavior are normal. ? ? ? ?ED Results / Procedures / Treatments  ? ?Labs ?(all labs ordered are listed, but only abnormal results are displayed) ?Labs Reviewed  ?CBC WITH DIFFERENTIAL/PLATELET - Abnormal; Notable for the following components:  ?    Result Value  ? WBC 12.7 (*)   ? Neutro Abs 8.8 (*)   ? Monocytes Absolute 1.4 (*)   ? All other components within normal limits  ?COMPREHENSIVE METABOLIC PANEL - Abnormal; Notable for the following components:  ? Glucose, Bld 276 (*)   ? BUN 23 (*)   ? Calcium 8.4 (*)   ? All other components within normal limits  ?CBG MONITORING, ED - Abnormal; Notable for the following components:  ? Glucose-Capillary 164 (*)   ? All other components within normal limits  ? ? ? ?EKG ? ? ? ? ?RADIOLOGY ?EMERGENCY DEPARTMENT US SOFT TISSUE INTERPRETATION ?"Study: Limited Soft Tissue Ultrasound" ? ?INDICATIONS: Soft tissue infection ?Multiple views of the body part were obtained in real-time with a multi-frequency linear probe ? ?PERFORMED BY: Myself ?IMAGES ARCHIVED?: No ?SIDE:Left ?BODY PART:Upper back ?INTERPRETATION:  Abcess present and Cellulitis present ? ? ? ? ?PROCEDURES: ? ?Critical Care performed: No ? ?[name].Incision and Drainage ? ?Date/Time: 06/20/2021 10:00 AM ?Performed by: Merlyn Lot, MD ?Authorized by: Merlyn Lot, MD  ? ?Consent:  ?  Consent obtained:  Verbal ?  Consent given by:  Patient ?  Risks discussed:  Bleeding, infection, incomplete drainage and pain ?  Alternatives discussed:  Alternative treatment, delayed treatment and observation ?Location:  ?  Type:  Abscess ?  Location:  Trunk ?  Trunk location:  Back ?Pre-procedure details:  ?  Skin preparation:  Povidone-iodine ?Anesthesia:  ?  Anesthesia method:  Local infiltration ?  Local anesthetic:  Lidocaine 1% w/o epi ?Procedure type:  ?  Complexity:  Simple ?Procedure details:  ?  Ultrasound guidance: yes   ?  Incision types:  Stab  incision ?  Incision depth:  Subcutaneous ?  Drainage:  Bloody and purulent ?  Drainage amount:  Scant ?  Wound treatment:  Wound left open ?  Packing materials:  None ?Post-procedure details:  ?  Procedure completion:  Tolerated well, no immediate complications ? ? ?MEDICATIONS ORDERED IN ED: ?Medications  ?vancomycin (VANCOREADY) IVPB 2000 mg/400 mL (2,000 mg Intravenous New Bag/Given 06/20/21 1059)  ?oxyCODONE-acetaminophen (PERCOCET/ROXICET) 5-325 MG per tablet 1 tablet (1 tablet Oral Given 06/20/21 0931)  ?lidocaine-prilocaine (EMLA) cream ( Topical Given 06/20/21 0931)  ?lidocaine (PF) (XYLOCAINE) 1 % injection 5 mL (5 mLs Intradermal Given by Other 06/20/21 0949)  ?insulin aspart (novoLOG) injection 8 Units (8 Units Subcutaneous Given 06/20/21 0931)  ?sodium chloride 0.9 % bolus 500 mL (0 mLs Intravenous Stopped 06/20/21 1025)  ?cefTRIAXone (ROCEPHIN) 1 g in sodium chloride 0.9 % 100 mL IVPB (0 g Intravenous Stopped 06/20/21 1059)  ? ? ? ?IMPRESSION / MDM / ASSESSMENT AND PLAN / ED COURSE  ?I reviewed the triage vital signs and the nursing notes. ?             ?               ? ?Differential diagnosis includes, but is not limited to, abscess, cellulitis, folliculitis, dermatitis, allergic reaction, ? ?Patient presenting to the ER with evidence of recurrent cellulitis probable abscess to the left upper back.  Not febrile mild leukocytosis.  Patient has been treated 4 times for similar area of cellulitis in the past 4 months.  Does appear that he is dealing with some poorly controlled diabetes likely contributing to his failure of outpatient management.  We will plan I&D for small area of fluid collection we will give IV antibiotics will order IV fluids as well as insulin. ? ? ?Clinical Course as of 06/20/21 1214  ?Mon Jun 20, 2021  ?0940 Patient's blood glucose is elevated.  We will give IV fluids as well as insulin. [PR]  ?1000 I&D performed to the area on ultrasound concerning for abscess with only scant purulent  drainage mostly bloody and serous fluid more consistent with cellulitis.  Given his hyperglycemia discussed option for hospitalization for IV antibiotics and blood pressure control with patient states that he feels well and would prefer deceiving get his blood sugar under control and have him follow-up as an outpatient as he is not septic at this time I think that is a reasonable option. [PR]  ?1214 Discussed case in consultation with hospitalist who agreed admit patient for further management and observation. [PR]  ?  ?Clinical Course User Index ?[PR] Merlyn Lot, MD  ? ? ? ?FINAL CLINICAL IMPRESSION(S) /  ED DIAGNOSES  ? ?Final diagnoses:  ?Cellulitis of back except buttock  ?Hyperglycemia  ? ? ? ?Rx / DC Orders  ? ?ED Discharge Orders   ? ? None  ? ?  ? ? ? ?Note:  This document was prepared using Dragon voice recognition software and may include unintentional dictation errors. ? ?  ?Merlyn Lot, MD ?06/20/21 1214 ? ?

## 2021-06-20 NOTE — Assessment & Plan Note (Signed)
Blood pressure stable ?Continue amlodipine ?

## 2021-06-20 NOTE — ED Notes (Signed)
Pt presents to the ED for recurrent spider bites, states that it has been going on for the past 4 months. Pt has been seen here each month for the same, pt states the bites are getting worse. Multiple insect bites noted on patient's back. Pt states it is painful and itchy. Pt has tried OTC medications and creams with no relief. Pt has a hx of diabetes. Noted to be multiple insect bites on pt's back with redness and swelling noted. Pt is A&OX4 and NAD ?

## 2021-06-20 NOTE — Assessment & Plan Note (Signed)
Patient with a history of recurrent cellulitis and abscess involving his left upper back. ?Has been treated on several occasions with Bactrim and Keflex without any significant improvement ?He is status post I&D which was done in the ER ?We will place patient on IV Rocephin ?

## 2021-06-20 NOTE — H&P (Addendum)
?History and Physical  ? ? ?Patient: Fernando Vega XIP:[id number] DOB: 1971/02/28 ?DOA: 06/20/2021 ?DOS: the patient was seen and examined on 06/20/2021 ?PCP: [place]  ?Patient coming from: Home ? ?Chief Complaint:  ?Chief Complaint  ?Patient presents with  ? Abscess  ? ?HPI: Fernando Vega is a 51 y.o. male with medical history significant for diabetes mellitus, hypertension, peripheral arterial disease status post right midfoot amputation who presents to the ER for the third time in 3 months for evaluation of nonhealing wounds on his left upper back. ?Patient has been seen twice in the emergency room and discharged on Keflex and Bactrim for cellulitis/abscess involving his left upper back over a tattoo area without any significant improvement.  He admits to being compliant with prescribed medications. ?He has an umbilical hernia and complains of intermittent pain in the periumbilical area associated with nausea but no vomiting and no changes in his bowel habits. ?He denies having any fever or chills, no chest pain, no shortness of breath, no dizziness, no lightheadedness, no changes in his bowel habits, no urinary symptoms, no blurred vision no focal deficit. ?His sugars have been poorly controlled because he states that his primary care provider recently stopped his Premeal insulin and he is only on long-acting insulin because of improvement in his hemoglobin A1c. ?He had an I&D done in the emergency room. ?Review of Systems: As mentioned in the history of present illness. All other systems reviewed and are negative. ?Past Medical History:  ?Diagnosis Date  ? Diabetes mellitus without complication ([place])   ? Neuropathy   ? Sarcoidosis   ? Sleep apnea   ? ?Past Surgical History:  ?Procedure Laterality Date  ? AMPUTATION Right 12/13/2020  ? Procedure: AMPUTATION midFOOT-RIGHT;  Surgeon: Criselda Peaches, DPM;  Location: [facility];  Service: Podiatry;  Laterality: Right;  ? AMPUTATION Right 11/05/2020   ? Procedure: AMPUTATION RAY-Partial Ray Amp 2nd & 3rd Digit;  Surgeon: Edrick Kins, DPM;  Location: [facility];  Service: Podiatry;  Laterality: Right;  ? APPLICATION OF WOUND VAC Right 11/09/2020  ? Procedure: APPLICATION OF WOUND VAC;  Surgeon: Edrick Kins, DPM;  Location: [facility];  Service: Podiatry;  Laterality: Right;  ? BONE BIOPSY Right 01/08/2021  ? Procedure: BONE BIOPSY;  Surgeon: Trula Slade, DPM;  Location: [facility];  Service: Podiatry;  Laterality: Right;  ? INCISION AND DRAINAGE Right 11/01/2020  ? Procedure: INCISION AND DRAINAGE-Right Foot;  Surgeon: Criselda Peaches, DPM;  Location: [facility];  Service: Podiatry;  Laterality: Right;  ? IRRIGATION AND DEBRIDEMENT FOOT Right 11/09/2020  ? Procedure: IRRIGATION AND DEBRIDEMENT FOOT;  Surgeon: Edrick Kins, DPM;  Location: [facility];  Service: Podiatry;  Laterality: Right;  ? IRRIGATION AND DEBRIDEMENT FOOT Right 12/13/2020  ? Procedure: IRRIGATION AND DEBRIDEMENT FOOT;  Surgeon: Criselda Peaches, DPM;  Location: [facility];  Service: Podiatry;  Laterality: Right;  ? TRANSMETATARSAL AMPUTATION Right 01/08/2021  ? Procedure: LISFRANC  AMPUTATION;  Surgeon: Trula Slade, DPM;  Location: [facility];  Service: Podiatry;  Laterality: Right;  ? WOUND DEBRIDEMENT Right 12/16/2020  ? Procedure: DEBRIDEMENT WOUND;  Surgeon: Felipa Furnace, DPM;  Location: [facility];  Service: Podiatry;  Laterality: Right;  Transmet amputation washout & closure  ? WOUND DEBRIDEMENT Right 01/08/2021  ? Procedure: DEBRIDEMENT WOUND;  Surgeon: Trula Slade, DPM;  Location: [facility];  Service: Podiatry;  Laterality: Right;  ? ?Social History:  reports that he has never smoked. He  has never used smokeless tobacco. He reports that he does not drink alcohol and does not use drugs. ? ?No Known Allergies ? ?Family History  ?Problem Relation Age of Onset  ? Hypertension Mother   ? Diabetes type II Father   ? Parkinson's disease Father   ? Heart disease Father   ? ? ?Prior to  Admission medications   ?Medication Sig Start Date End Date Taking? Authorizing Provider  ?acetaminophen (TYLENOL) 500 MG tablet Take 1 tablet (500 mg total) by mouth every 6 (six) hours as needed for mild pain (or Fever >/= 101). 12/19/20  Yes Cherene Altes, MD  ?albuterol (VENTOLIN HFA) 108 (90 Base) MCG/ACT inhaler Inhale 2 puffs into the lungs once every 6 (six) hours as needed for wheezing or shortness of breath. 01/27/21  Yes Iloabachie, Chioma E, NP  ?amLODipine (NORVASC) 10 MG tablet Take 1 tablet (10 mg total) by mouth once daily. 12/30/20  Yes Iloabachie, Chioma E, NP  ?Ascorbic Acid (VITAMIN C WITH ROSE HIPS) 500 MG tablet Take 500 mg by mouth daily.   Yes [provider]  ?atorvastatin (LIPITOR) 20 MG tablet Take 20 mg by mouth daily. 05/27/21  Yes [provider]  ?gabapentin (NEURONTIN) 300 MG capsule Take 1 capsule (300 mg total) by mouth once daily at bedtime. 12/30/20  Yes Iloabachie, Chioma E, NP  ?Insulin Glargine (BASAGLAR KWIKPEN) 100 UNIT/ML Inject 15 Units into the skin at bedtime. 12/30/20  Yes Iloabachie, Chioma E, NP  ?pantoprazole (PROTONIX) 40 MG tablet Take 1 tablet (40 mg total) by mouth once daily. 12/30/20  Yes Iloabachie, Chioma E, NP  ?insulin lispro (HUMALOG KWIKPEN) 100 UNIT/ML KwikPen Inject 7 Units into the skin 3 (three) times daily before meals. ?Patient not taking: Reported on 06/20/2021 01/27/21   Langston Reusing, NP  ?Insulin Pen Needle 32G X 6 MM MISC USE AS DIRECTED WITH INSULIN PENS. 12/30/20   Iloabachie, Chioma E, NP  ?LEVEMIR FLEXPEN 100 UNIT/ML FlexPen Inject into the skin. ?Patient not taking: Reported on 06/20/2021 05/27/21   [provider]  ?Multiple Vitamin (MULTIVITAMIN WITH MINERALS) TABS tablet Take 1 tablet by mouth once daily. ?Patient not taking: Reported on 06/20/2021 11/13/20   Loletha Grayer, MD  ?traMADol (ULTRAM) 50 MG tablet Take 1 tablet (50 mg total) by mouth every 6 (six) hours as needed for moderate pain. ?Patient  not taking: Reported on 06/20/2021 01/17/21   Edrick Kins, DPM  ? ? ?Physical Exam: ?Vitals:  ? 06/20/21 0938 06/20/21 0945 06/20/21 1000 06/20/21 1030  ?BP:  (!) 141/75 138/76 (!) 143/94  ?Pulse:  92 92 90  ?Resp:  '20 18 20  '$ ?Temp:      ?TempSrc:      ?SpO2:  94% 98% 97%  ?Weight: 85.7 kg     ?Height: '5\' 7"'$  (1.702 m)     ? ?Physical Exam ?Vitals and nursing note reviewed.  ?Constitutional:   ?   Appearance: Normal appearance. He is normal weight.  ?HENT:  ?   Head: Normocephalic and atraumatic.  ?   Nose: Nose normal.  ?   Mouth/Throat:  ?   Mouth: Mucous membranes are moist.  ?Eyes:  ?   Pupils: Pupils are equal, round, and reactive to light.  ?Cardiovascular:  ?   Rate and Rhythm: Normal rate and regular rhythm.  ?Pulmonary:  ?   Effort: Pulmonary effort is normal.  ?   Breath sounds: Normal breath sounds.  ?Abdominal:  ?   General: Bowel sounds  are normal.  ?   Palpations: Abdomen is soft.  ?   Hernia: A hernia is present.  ?   Comments: Umbilical hernia present  ?Musculoskeletal:     ?   General: Normal range of motion.  ?   Cervical back: Normal range of motion and neck supple.  ?Skin: ?   Comments: Redness, differential warmth over the left upper back around a tattooe area  ?Neurological:  ?   General: No focal deficit present.  ?   Mental Status: He is alert and oriented to person, place, and time.  ?Psychiatric:     ?   Mood and Affect: Mood normal.  ? ? ?Data Reviewed: ?Relevant notes from primary care and specialist visits, past discharge summaries as available in EHR, including Care Everywhere. ?Prior diagnostic testing as pertinent to current admission diagnoses ?Updated medications and problem lists for reconciliation ?ED course, including vitals, labs, imaging, treatment and response to treatment ?Triage notes, nursing and pharmacy notes and ED provider's notes ?Notable results as noted in HPI ?Labs reviewed.  Sodium 136, potassium 1, chloride 100, bicarb 28, glucose 276, BUN 23, creatinine 1.01,  white count 12.7 ?There are no new results to review at this time. ? 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 ?Has bee

## 2021-06-20 NOTE — Assessment & Plan Note (Signed)
Place patient on consistent carbohydrate diet ?Continue long-acting insulin and will place patient on sliding scale coverage during this hospitalization. ?He will most likely need to resume his Premeal insulin upon discharge ?Continue gabapentin for neuropathic pain ?

## 2021-06-20 NOTE — ED Triage Notes (Signed)
Pt c/o having multiple spider bites on his back that dont want to seem to heal, pt has been here multiple times for the same. ?

## 2021-06-20 NOTE — Consult Note (Signed)
PHARMACY -  BRIEF ANTIBIOTIC NOTE  ? ?Pharmacy has received consult(s) for vancomycin from an ED provider.  The patient's profile has been reviewed for ht/wt/allergies/indication/available labs.   ? ?One time order(s) placed for vancomycin 2 g IV  ? ?Further antibiotics/pharmacy consults should be ordered by admitting physician if indicated.       ?                ?Thank you, ?Darnelle Bos, PharmD  ?06/20/2021  10:17 AM ? ?

## 2021-06-20 NOTE — Progress Notes (Addendum)
Inpatient Diabetes Program Recommendations ? ?AACE/ADA: New Consensus Statement on Inpatient Glycemic Control (2015) ? ?Target Ranges:  Prepandial:   less than 140 mg/dL ?     Peak postprandial:   less than 180 mg/dL (1-2 hours) ?     Critically ill patients:  140 - 180 mg/dL  ? ? Latest Reference Range & Units 06/20/21 08:48  ?Glucose 70 - 99 mg/dL 276 (H) ? ?8 units Novolog ?'@0931'  + ? ?500 ml NS  ?(H): Data is abnormally high ? ? ? ?To ED with Recurrent cellulitis of his left posterior back Multiple Spider bites ? ?History: DM ? ?Home DM Meds: Basaglar 15 units QHS ?      Humalog 7 units TID with meals ? ? ? ?MD- Note CBG 276 this AM.  Given IVF and 8 units Novolog X 1 dose. ? ?If plan is to admit, please make sure to order: ? ?Basal Insulin: Semglee 15 units QHS ? ?Novolog SSI: Novolog Moderate Correction Scale/ SSI (0-15 units) TID AC + HS ? ? ?Addendum 12:20pm--Met w/ pt in the ED.  Pt told me his CBGs have been running higher at home over the last few weeks.  Told me he got established with new PCP (Dr. Purcell Nails with [facility]) back in early March.  Told me his A1c was 6.8% at that visit and that his PCP told him to stop taking the Humalog.  Pt told me he wants to restart the Humalog at home b/c his CBGs were better controlled when he was taking the Humalog.  Discussed with pt that I will ask the Hospital MD to review his meds and give further advice on whether he should resume the Humalog at home--Asked pt to also address this question with the MD.  Pt stated he takes Lantus 15 units QHs at home and was taking Humalog 7 units TID with meals.  Hasn't checked CBGs in several weeks b/c he ran out of strips for his meter.  Stated his new PCP gave him a new meter with 3 bottle of strips, however, pt has a hard time reading fine print and has not been able to set up his new meter.  Asked pt if he could have his wife read the instructions and assist him with set up of the new meter--pt stated he will  ask her.  I reminded pt that he and his wife can call the 1-800# of the company and also get assistance setting up the meter by phone.  Reviewed healthy CBG goals with pt.  Pt very appreciative of visit and did not have any further questions for me. ? ? ? ? ?--Will follow patient during hospitalization-- ? ?Wyn Quaker RN, MSN, CDE ?Diabetes Coordinator ?Inpatient Glycemic Control Team ?Team Pager: [phone] (8a-5p) ? ? ?

## 2021-06-21 ENCOUNTER — Other Ambulatory Visit: Payer: Self-pay

## 2021-06-21 DIAGNOSIS — L03312 Cellulitis of back [any part except buttock]: Secondary | ICD-10-CM | POA: Diagnosis not present

## 2021-06-21 DIAGNOSIS — L03818 Cellulitis of other sites: Secondary | ICD-10-CM | POA: Diagnosis not present

## 2021-06-21 DIAGNOSIS — I10 Essential (primary) hypertension: Secondary | ICD-10-CM | POA: Diagnosis not present

## 2021-06-21 DIAGNOSIS — E1142 Type 2 diabetes mellitus with diabetic polyneuropathy: Secondary | ICD-10-CM | POA: Diagnosis not present

## 2021-06-21 LAB — CBC
HCT: 40 % (ref 39.0–52.0)
Hemoglobin: 12.9 g/dL — ABNORMAL LOW (ref 13.0–17.0)
MCH: 29.6 pg (ref 26.0–34.0)
MCHC: 32.3 g/dL (ref 30.0–36.0)
MCV: 91.7 fL (ref 80.0–100.0)
Platelets: 229 10*3/uL (ref 150–400)
RBC: 4.36 MIL/uL (ref 4.22–5.81)
RDW: 12.7 % (ref 11.5–15.5)
WBC: 12.1 10*3/uL — ABNORMAL HIGH (ref 4.0–10.5)
nRBC: 0 % (ref 0.0–0.2)

## 2021-06-21 LAB — BASIC METABOLIC PANEL
Anion gap: 6 (ref 5–15)
BUN: 21 mg/dL — ABNORMAL HIGH (ref 6–20)
CO2: 29 mmol/L (ref 22–32)
Calcium: 8.3 mg/dL — ABNORMAL LOW (ref 8.9–10.3)
Chloride: 102 mmol/L (ref 98–111)
Creatinine, Ser: 0.87 mg/dL (ref 0.61–1.24)
GFR, Estimated: >60 mL/min (ref >60)
Glucose, Bld: 199 mg/dL — ABNORMAL HIGH (ref 70–99)
Potassium: 4 mmol/L (ref 3.5–5.1)
Sodium: 137 mmol/L (ref 135–145)

## 2021-06-21 LAB — GLUCOSE, CAPILLARY
Glucose-Capillary: 212 mg/dL — ABNORMAL HIGH (ref 70–99)
Glucose-Capillary: 243 mg/dL — ABNORMAL HIGH (ref 70–99)

## 2021-06-21 MED ORDER — DOXYCYCLINE HYCLATE 100 MG PO TABS
100.0000 mg | ORAL_TABLET | Freq: Two times a day (BID) | ORAL | 0 refills | Status: DC
  Filled 2021-06-21: qty 14, 7d supply, fill #0

## 2021-06-21 MED ORDER — DOXYCYCLINE HYCLATE 100 MG PO TABS: 100.0000 mg | ORAL_TABLET | Freq: Two times a day (BID) | ORAL | 0 refills | Status: AC

## 2021-06-21 NOTE — Care Management (Signed)
?  Transition of Care (TOC) Screening Note ? ? ?Patient Details  ?Name: Fernando Vega ?Date of Birth: 12/29/1970 ? ? ?Transition of Care (TOC) CM/SW Contact:    ?Pete Pelt, RN ?Phone Number: ?06/21/2021, 1:12 PM ? ?No needs per care team ? ? ?Transition of Care Department [facility]) has reviewed patient and no TOC needs have been identified at this time. We will continue to monitor patient advancement through interdisciplinary progression rounds. If new patient transition needs arise, please place a TOC consult. ?  ?

## 2021-06-21 NOTE — Discharge Summary (Signed)
?Physician Discharge Summary ?  ?Patient: Fernando Vega MRN: [id number] DOB: [birth year]  ?Admit date:     06/20/2021  ?Discharge date: 06/21/21  ?Discharge Physician: Lorella Nimrod  ? ?PCP: [place]  ? ?Recommendations at discharge:  ?Please obtain CBC and BMP in 1 week ?Follow-up with primary care provider in 1 week ? ?Discharge Diagnoses: ?Principal Problem: ?  Cellulitis ?Active Problems: ?  Type 2 diabetes mellitus with peripheral neuropathy ([place]) ?  Essential hypertension ? ?Hospital Course: ?Fernando Vega is a 51 y.o. male with medical history significant for diabetes mellitus, hypertension, peripheral arterial disease status post right midfoot amputation who presents to the ER for the third time in 3 months for evaluation of nonhealing wounds on his left upper back. ?Patient has been seen twice in the emergency room and discharged on Keflex and Bactrim for cellulitis/abscess involving his left upper back over a tattoo area without any significant improvement.  He admits to being compliant with prescribed medications. ?He has an umbilical hernia and complains of intermittent pain in the periumbilical area associated with nausea but no vomiting and no changes in his bowel habits. ?He denies having any fever or chills, no chest pain, no shortness of breath, no dizziness, no lightheadedness, no changes in his bowel habits, no urinary symptoms, no blurred vision no focal deficit. ?His sugars have been poorly controlled because he states that his primary care provider recently stopped his Premeal insulin and he is only on long-acting insulin because of improvement in his hemoglobin A1c. ?He had an I&D done in the emergency room. ? ?He remained hemodynamically stable.  Labs with mild leukocytosis which started improving.  He was initially placed on ceftriaxone.  Remained stable and recent anaerobic cultures stays negative.  He was discharged on a course of doxycycline and need to follow-up with his  primary care provider within a week. ? ?He was also instructed to restart mealtime coverage due to hyperglycemia. ? ?He will continue rest of his home medications and follow-up with his providers. ? 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 ?Has been treated on several occasions with Bactrim and Keflex without any significant improvement ?He is status post I&D which was done in the ER ?We will place patient on IV Rocephin ? ?Essential hypertension ?Blood pressure stable ?Continue amlodipine ? ?Type 2 diabetes mellitus with peripheral neuropathy (HCC) ?Place patient on consistent carbohydrate diet ?Continue long-acting insulin and will place patient on sliding scale coverage during this hospitalization. ?He will most likely need to resume his Premeal insulin upon discharge ?Continue gabapentin for neuropathic pain ? ? ?Consultants: None ?Procedures performed: Incision and drainage of a small abscess on his upper back ?Disposition: Home ?Diet recommendation:  ?Discharge Diet Orders (From admission, onward)  ? ?  Start     Ordered  ? 06/21/21 0000  Diet - low sodium heart healthy       ? 06/21/21 1055  ? ?  ?  ? ?  ? ?Cardiac and Carb modified diet ?DISCHARGE MEDICATION: ?Allergies as of 06/21/2021   ?No Known Allergies ?  ? ?  ?Medication List  ?  ? ?STOP taking these medications   ? ?Levemir FlexPen 100 UNIT/ML FlexPen ?Generic drug: insulin detemir ?  ?traMADol 50 MG tablet ?Commonly known as: ULTRAM ?  ? ?  ? ?TAKE these medications   ? ?acetaminophen 500 MG tablet ?Commonly known as: TYLENOL ?Take 1 tablet (  500 mg total) by mouth every 6 (six) hours as needed for mild pain (or Fever >/= 101). ?  ?albuterol 108 (90 Base) MCG/ACT inhaler ?Commonly known as: VENTOLIN HFA ?Inhale 2 puffs into the lungs once every 6 (six) hours as needed for wheezing or shortness of breath. ?  ?amLODipine 10 MG tablet ?Commonly known as: NORVASC ?Take 1  tablet (10 mg total) by mouth once daily. ?  ?atorvastatin 20 MG tablet ?Commonly known as: LIPITOR ?Take 20 mg by mouth daily. ?  ?Basaglar KwikPen 100 UNIT/ML ?Inject 15 Units into the skin at bedtime. ?  ?doxycycline 100 MG tablet ?Commonly known as: VIBRA-TABS ?Take 1 tablet (100 mg total) by mouth 2 (two) times daily for 7 days. ?  ?gabapentin 300 MG capsule ?Commonly known as: NEURONTIN ?Take 1 capsule (300 mg total) by mouth once daily at bedtime. ?  ?insulin lispro 100 UNIT/ML KwikPen ?Commonly known as: HumaLOG KwikPen ?Inject 7 Units into the skin 3 (three) times daily before meals. ?  ?Insulin Pen Needle 32G X 6 MM Misc ?USE AS DIRECTED WITH INSULIN PENS. ?  ?multivitamin with minerals Tabs tablet ?Take 1 tablet by mouth once daily. ?  ?pantoprazole 40 MG tablet ?Commonly known as: PROTONIX ?Take 1 tablet (40 mg total) by mouth once daily. ?  ?vitamin C with rose hips 500 MG tablet ?Take 500 mg by mouth daily. ?  ? ?  ? ?  ?  ? ? ?  ?Discharge Care Instructions  ?(From admission, onward)  ?  ? ? ?  ? ?  Start     Ordered  ? 06/21/21 0000  Discharge wound care:       ?Comments: Change your dressing daily.  ? 06/21/21 1055  ? ?  ?  ? ?  ? ? Follow-up Information   ? ? [place] Schedule an appointment as soon as possible for a visit in 1 week(s).   ?Contact information: ?[address], [address] ?[place] [address] ?[phone] ? ? ?  ?  ? ?  ?  ? ?  ? ?Discharge Exam: ?Filed Weights  ? 06/20/21 0938  ?Weight: 85.7 kg  ? ?General.     In no acute distress. ?Pulmonary.  Lungs clear bilaterally, normal respiratory effort. ?CV.  Regular rate and rhythm, no JVD, rub or murmur. ?Abdomen.  Soft, nontender, nondistended, BS positive. ?CNS.  Alert and oriented x3.  No focal neurologic deficit. ?Extremities.  No edema, no cyanosis, pulses intact and symmetrical. ?Psychiatry.  Judgment and insight appears normal.  ? ?Condition at discharge: stable ? ?The results of significant diagnostics from  this hospitalization (including imaging, microbiology, ancillary and laboratory) are listed below for reference.  ? ?Imaging Studies: ?No results found. ? ?Microbiology: ?Results for orders placed or performed during the hospital encounter of 01/06/21  ?Culture, blood (routine x 2)     Status: None  ? Collection Time: 01/06/21  5:35 PM  ? Specimen: BLOOD  ?Result Value Ref Range Status  ? Specimen Description BLOOD LEFT ANTECUBITAL  Final  ? Special Requests   Final  ?  BOTTLES DRAWN AEROBIC AND ANAEROBIC Blood Culture adequate volume  ? Culture   Final  ?  NO GROWTH 5 DAYS ?Performed at [facility], [address] [place], [place] [address] ?  ? Report Status 01/11/2021 FINAL  Final  ?Resp Panel by RT-PCR (Flu A&B, Covid) Nasopharyngeal Swab     Status: None  ? Collection Time: 01/06/21  5:35 PM  ? Specimen: Nasopharyngeal Swab; Nasopharyngeal(NP) swabs in vial transport medium  ?Result Value Ref Range Status  ? SARS Coronavirus 2 by RT PCR NEGATIVE NEGATIVE Final  ?  Comment: (NOTE) ?SARS-CoV-2 target nucleic acids are NOT DETECTED. ? ?The SARS-CoV-2 RNA is generally detectable in upper respiratory ?specimens during the acute phase of infection. The lowest ?concentration of SARS-CoV-2 viral copies this assay can detect is ?138 copies/mL. A negative result does not preclude SARS-Cov-2 ?infection and should not be used as the sole basis for treatment or ?Fernando patient management decisions. A negative result may occur with  ?improper specimen collection/handling, submission of specimen Fernando ?than nasopharyngeal swab, presence of viral mutation(s) within the ?areas targeted by this assay, and inadequate number of viral ?copies(<138 copies/mL). A negative result must be combined with ?clinical observations, patient history, and epidemiological ?information. The expected result is Negative. ? ?Fact Sheet for Patients:  ?EntrepreneurPulse.com.au ? ?Fact Sheet for Healthcare Providers:   ?IncredibleEmployment.be ? ?This test is no t yet approved or cleared by the [place] FDA and  ?has been authorized for detection and/or diagnosis of SARS-CoV-2 by ?FDA under an Eme

## 2021-06-27 ENCOUNTER — Telehealth: Payer: Self-pay | Admitting: Gastroenterology

## 2021-06-27 NOTE — Telephone Encounter (Signed)
Pt received letter regarding setting up colonoscopy, would like to schedule pls call back at [phone] ?

## 2021-06-28 ENCOUNTER — Telehealth: Payer: Self-pay

## 2021-06-28 NOTE — Telephone Encounter (Signed)
CALLED PATIENT NO ANSWER LEFT VOICEMAIL FOR A CALL BACK ? ?

## 2021-06-28 NOTE — Telephone Encounter (Signed)
Pt is returning your call to schedule colonoscopy. ?

## 2021-06-29 ENCOUNTER — Other Ambulatory Visit: Payer: Self-pay

## 2021-06-29 DIAGNOSIS — Z86010 Personal history of colonic polyps: Secondary | ICD-10-CM

## 2021-06-29 MED ORDER — PEG 3350-KCL-NA BICARB-NACL 420 G PO SOLR: 4000.0000 mL | Freq: Once | ORAL | 0 refills | Status: AC

## 2021-06-29 NOTE — Progress Notes (Signed)
Gastroenterology Pre-Procedure Review ? ?Request Date: 07/19/2021 ?Requesting Physician: Dr. Vicente Males ? ? ?PATIENT REVIEW QUESTIONS: The patient responded to the following health history questions as indicated:   ? ?1. Are you having any GI issues? no ?2. Do you have a personal history of Polyps? no ?3. Do you have a family history of Colon Cancer or Polyps? yes (colon cancer) ?4. Diabetes Mellitus? yes (type 1) ?5. Joint replacements in the past 12 months?yes (last year half of foot removed ) ?6. Major health problems in the past 3 months?no ?7. Any artificial heart valves, MVP, or defibrillator?no ?   ?MEDICATIONS & ALLERGIES:    ?Patient reports the following regarding taking any anticoagulation/antiplatelet therapy:   ?Plavix, Coumadin, Eliquis, Xarelto, Lovenox, Pradaxa, Brilinta, or Effient? no ?Aspirin? no ? ?Patient confirms/reports the following medications:  ?Current Outpatient Medications  ?Medication Sig Dispense Refill  ? acetaminophen (TYLENOL) 500 MG tablet Take 1 tablet (500 mg total) by mouth every 6 (six) hours as needed for mild pain (or Fever >/= 101). 30 tablet 0  ? albuterol (VENTOLIN HFA) 108 (90 Base) MCG/ACT inhaler Inhale 2 puffs into the lungs once every 6 (six) hours as needed for wheezing or shortness of breath. 6.7 g 0  ? amLODipine (NORVASC) 10 MG tablet Take 1 tablet (10 mg total) by mouth once daily. 30 tablet 2  ? Ascorbic Acid (VITAMIN C WITH ROSE HIPS) 500 MG tablet Take 500 mg by mouth daily.    ? atorvastatin (LIPITOR) 20 MG tablet Take 20 mg by mouth daily.    ? gabapentin (NEURONTIN) 300 MG capsule Take 1 capsule (300 mg total) by mouth once daily at bedtime. 30 capsule 2  ? Insulin Glargine (BASAGLAR KWIKPEN) 100 UNIT/ML Inject 15 Units into the skin at bedtime. 15 mL 3  ? insulin lispro (HUMALOG KWIKPEN) 100 UNIT/ML KwikPen Inject 7 Units into the skin 3 (three) times daily before meals. (Patient not taking: Reported on 06/20/2021) 15 mL 3  ? Insulin Pen Needle 32G X 6 MM MISC  USE AS DIRECTED WITH INSULIN PENS. 100 each 1  ? Multiple Vitamin (MULTIVITAMIN WITH MINERALS) TABS tablet Take 1 tablet by mouth once daily. (Patient not taking: Reported on 06/20/2021) 30 tablet 0  ? pantoprazole (PROTONIX) 40 MG tablet Take 1 tablet (40 mg total) by mouth once daily. 30 tablet 2  ? ?No current facility-administered medications for this visit.  ? ? ?Patient confirms/reports the following allergies:  ?No Known Allergies ? ?No orders of the defined types were placed in this encounter. ? ? ?AUTHORIZATION INFORMATION ?Primary Insurance: ?1D#: ?Group #: ? ?Secondary Insurance: ?1D#: ?Group #: ? ?SCHEDULE INFORMATION: ?Date: 07/19/2021 ?Time: ?Location:[facility] ? ? ?

## 2021-07-14 ENCOUNTER — Other Ambulatory Visit: Payer: Self-pay

## 2021-07-14 ENCOUNTER — Encounter: Payer: Self-pay | Admitting: Gastroenterology

## 2021-07-15 ENCOUNTER — Other Ambulatory Visit: Payer: Self-pay

## 2021-07-19 ENCOUNTER — Ambulatory Visit: Payer: Medicaid Other | Admitting: Certified Registered Nurse Anesthetist

## 2021-07-19 ENCOUNTER — Encounter: Payer: Self-pay | Admitting: Gastroenterology

## 2021-07-19 ENCOUNTER — Ambulatory Visit
Admission: RE | Admit: 2021-07-19 | Discharge: 2021-07-19 | Disposition: A | Discharge status: Home / Self Care | Payer: Medicaid Other | Source: Ambulatory Visit | Attending: Gastroenterology | Admitting: Gastroenterology

## 2021-07-19 ENCOUNTER — Encounter
Admission: RE | Disposition: A | Discharge status: Home / Self Care | Payer: Self-pay | Source: Ambulatory Visit | Attending: Gastroenterology

## 2021-07-19 DIAGNOSIS — D125 Benign neoplasm of sigmoid colon: Secondary | ICD-10-CM | POA: Insufficient documentation

## 2021-07-19 DIAGNOSIS — D122 Benign neoplasm of ascending colon: Secondary | ICD-10-CM | POA: Insufficient documentation

## 2021-07-19 DIAGNOSIS — E1140 Type 2 diabetes mellitus with diabetic neuropathy, unspecified: Secondary | ICD-10-CM | POA: Diagnosis not present

## 2021-07-19 DIAGNOSIS — Z1211 Encounter for screening for malignant neoplasm of colon: Secondary | ICD-10-CM | POA: Insufficient documentation

## 2021-07-19 DIAGNOSIS — Z86010 Personal history of colonic polyps: Secondary | ICD-10-CM

## 2021-07-19 DIAGNOSIS — I10 Essential (primary) hypertension: Secondary | ICD-10-CM | POA: Insufficient documentation

## 2021-07-19 DIAGNOSIS — K635 Polyp of colon: Secondary | ICD-10-CM | POA: Diagnosis not present

## 2021-07-19 DIAGNOSIS — G4730 Sleep apnea, unspecified: Secondary | ICD-10-CM | POA: Diagnosis not present

## 2021-07-19 DIAGNOSIS — D128 Benign neoplasm of rectum: Secondary | ICD-10-CM | POA: Diagnosis not present

## 2021-07-19 DIAGNOSIS — D123 Benign neoplasm of transverse colon: Secondary | ICD-10-CM | POA: Insufficient documentation

## 2021-07-19 HISTORY — DX: Essential (primary) hypertension: I10

## 2021-07-19 HISTORY — PX: COLONOSCOPY WITH PROPOFOL: SHX5780

## 2021-07-19 LAB — GLUCOSE, CAPILLARY: Glucose-Capillary: 130 mg/dL — ABNORMAL HIGH (ref 70–99)

## 2021-07-19 SURGERY — COLONOSCOPY WITH PROPOFOL
Anesthesia: General

## 2021-07-19 MED ORDER — PROPOFOL 10 MG/ML IV BOLUS
INTRAVENOUS | Status: DC | PRN
  Administered 2021-07-19: 70 mg via INTRAVENOUS

## 2021-07-19 MED ORDER — PROPOFOL 500 MG/50ML IV EMUL
INTRAVENOUS | Status: DC | PRN
Start: 2021-07-19 — End: 2021-07-19
  Administered 2021-07-19: 160 ug/kg/min via INTRAVENOUS

## 2021-07-19 MED ORDER — EPHEDRINE SULFATE (PRESSORS) 50 MG/ML IJ SOLN
INTRAMUSCULAR | Status: DC | PRN
Start: 2021-07-19 — End: 2021-07-19
  Administered 2021-07-19 (×2): 5 mg via INTRAVENOUS

## 2021-07-19 MED ORDER — EPHEDRINE 5 MG/ML INJ
INTRAVENOUS | Status: AC
  Filled 2021-07-19: qty 5

## 2021-07-19 MED ORDER — SODIUM CHLORIDE 0.9 % IV SOLN: INTRAVENOUS | Status: DC

## 2021-07-19 NOTE — Anesthesia Procedure Notes (Signed)
Date/Time: 07/19/2021 9:52 AM ?Performed by: Demetrius Charity, CRNA ?Pre-anesthesia Checklist: Patient identified, Emergency Drugs available, Suction available, Patient being monitored and Timeout performed ?Patient Re-evaluated:Patient Re-evaluated prior to induction ?Oxygen Delivery Method: Nasal cannula ?Induction Type: IV induction ?Placement Confirmation: CO2 detector and positive ETCO2 ? ? ? ? ?

## 2021-07-19 NOTE — H&P (Signed)
? ? ? ?Jonathon Bellows, MD ?[address], [place], [place], [place], [address] ?[address] [place], [place], [place], [address] ?Phone: [phone]  ?Fax: [phone] ? ?Primary Care Physician:  [place] ? ? ?Pre-Procedure History & Physical: ?HPI:  Fernando Vega is a 51 y.o. male is here for an colonoscopy. ?  ?Past Medical History:  ?Diagnosis Date  ? Diabetes mellitus without complication ([place])   ? Hypertension   ? Neuropathy   ? Sarcoidosis   ? Sleep apnea   ? ? ?Past Surgical History:  ?Procedure Laterality Date  ? AMPUTATION Right 12/13/2020  ? Procedure: AMPUTATION midFOOT-RIGHT;  Surgeon: Criselda Peaches, DPM;  Location: [facility];  Service: Podiatry;  Laterality: Right;  ? AMPUTATION Right 11/05/2020  ? Procedure: AMPUTATION RAY-Partial Ray Amp 2nd & 3rd Digit;  Surgeon: Edrick Kins, DPM;  Location: [facility];  Service: Podiatry;  Laterality: Right;  ? APPLICATION OF WOUND VAC Right 11/09/2020  ? Procedure: APPLICATION OF WOUND VAC;  Surgeon: Edrick Kins, DPM;  Location: [facility];  Service: Podiatry;  Laterality: Right;  ? BONE BIOPSY Right 01/08/2021  ? Procedure: BONE BIOPSY;  Surgeon: Trula Slade, DPM;  Location: [facility];  Service: Podiatry;  Laterality: Right;  ? INCISION AND DRAINAGE Right 11/01/2020  ? Procedure: INCISION AND DRAINAGE-Right Foot;  Surgeon: Criselda Peaches, DPM;  Location: [facility];  Service: Podiatry;  Laterality: Right;  ? IRRIGATION AND DEBRIDEMENT FOOT Right 11/09/2020  ? Procedure: IRRIGATION AND DEBRIDEMENT FOOT;  Surgeon: Edrick Kins, DPM;  Location: [facility];  Service: Podiatry;  Laterality: Right;  ? IRRIGATION AND DEBRIDEMENT FOOT Right 12/13/2020  ? Procedure: IRRIGATION AND DEBRIDEMENT FOOT;  Surgeon: Criselda Peaches, DPM;  Location: [facility];  Service: Podiatry;  Laterality: Right;  ? TRANSMETATARSAL AMPUTATION Right 01/08/2021  ? Procedure: LISFRANC  AMPUTATION;  Surgeon: Trula Slade, DPM;  Location: [facility];  Service:  Podiatry;  Laterality: Right;  ? WOUND DEBRIDEMENT Right 12/16/2020  ? Procedure: DEBRIDEMENT WOUND;  Surgeon: Felipa Furnace, DPM;  Location: [facility];  Service: Podiatry;  Laterality: Right;  Transmet amputation washout & closure  ? WOUND DEBRIDEMENT Right 01/08/2021  ? Procedure: DEBRIDEMENT WOUND;  Surgeon: Trula Slade, DPM;  Location: [facility];  Service: Podiatry;  Laterality: Right;  ? ? ?Prior to Admission medications   ?Medication Sig Start Date End Date Taking? Authorizing Provider  ?albuterol (VENTOLIN HFA) 108 (90 Base) MCG/ACT inhaler Inhale 2 puffs into the lungs once every 6 (six) hours as needed for wheezing or shortness of breath. 01/27/21  Yes Iloabachie, Chioma E, NP  ?amLODipine (NORVASC) 10 MG tablet Take 1 tablet (10 mg total) by mouth once daily. 12/30/20  Yes Iloabachie, Chioma E, NP  ?gabapentin (NEURONTIN) 300 MG capsule Take 1 capsule (300 mg total) by mouth once daily at bedtime. 12/30/20  Yes Iloabachie, Chioma E, NP  ?insulin lispro (HUMALOG KWIKPEN) 100 UNIT/ML KwikPen Inject 7 Units into the skin 3 (three) times daily before meals. 01/27/21  Yes Iloabachie, Chioma E, NP  ?Multiple Vitamin (MULTIVITAMIN WITH MINERALS) TABS tablet Take 1 tablet by mouth once daily. 11/13/20  Yes Wieting, Richard, MD  ?pantoprazole (PROTONIX) 40 MG tablet Take 1 tablet (40 mg total) by mouth once daily. 12/30/20  Yes Iloabachie, Chioma E, NP  ?acetaminophen (TYLENOL) 500 MG tablet Take 1 tablet (500 mg total) by mouth every 6 (six) hours as needed for mild pain (or Fever >/= 101). 12/19/20  Cherene Altes, MD  ?Ascorbic Acid (VITAMIN C WITH ROSE HIPS) 500 MG tablet Take 500 mg by mouth daily.    [provider]  ?atorvastatin (LIPITOR) 20 MG tablet Take 20 mg by mouth daily. 05/27/21   [provider]  ?Insulin Glargine (BASAGLAR KWIKPEN) 100 UNIT/ML Inject 15 Units into the skin at bedtime. 12/30/20   Iloabachie, Chioma E, NP  ?Insulin Pen Needle 32G X 6 MM MISC USE AS DIRECTED  WITH INSULIN PENS. 12/30/20   Iloabachie, Chioma E, NP  ? ? ?Allergies as of 06/29/2021  ? (No Known Allergies)  ? ? ?Family History  ?Problem Relation Age of Onset  ? Hypertension Mother   ? Diabetes type II Father   ? Parkinson's disease Father   ? Heart disease Father   ? ? ?Social History  ? ?Socioeconomic History  ? Marital status: Married  ?  Spouse name: Not on file  ? Number of children: Not on file  ? Years of education: Not on file  ? Highest education level: Not on file  ?Occupational History  ? Not on file  ?Tobacco Use  ? Smoking status: Never  ? Smokeless tobacco: Never  ?Vaping Use  ? Vaping Use: Never used  ?Substance and Sexual Activity  ? Alcohol use: No  ? Drug use: No  ? Sexual activity: Not on file  ?Other Topics Concern  ? Not on file  ?Social History Narrative  ? Not on file  ? ?Social Determinants of Health  ? ?Financial Resource Strain: Not on file  ?Food Insecurity: No Food Insecurity  ? Worried About Charity fundraiser in the Last Year: Never true  ? Ran Out of Food in the Last Year: Never true  ?Transportation Needs: No Transportation Needs  ? Lack of Transportation (Medical): No  ? Lack of Transportation (Non-Medical): No  ?Physical Activity: Not on file  ?Stress: Not on file  ?Social Connections: Not on file  ?Intimate Partner Violence: Not on file  ? ? ?Review of Systems: ?See HPI, otherwise negative ROS ? ?Physical Exam: ?BP 103/68   Pulse 88   Temp (!) 96.7 ?F (35.9 ?C) (Temporal)   Resp 18   Ht '5\' 6"'$  (1.676 m)   Wt 85.3 kg   SpO2 97%   BMI 30.34 kg/m?  ?General:   Alert,  pleasant and cooperative in NAD ?Head:  Normocephalic and atraumatic. ?Neck:  Supple; no masses or thyromegaly. ?Lungs:  Clear throughout to auscultation, normal respiratory effort.    ?Heart:  +S1, +S2, Regular rate and rhythm, No edema. ?Abdomen:  Soft, nontender and nondistended. Normal bowel sounds, without guarding, and without rebound.   ?Neurologic:  Alert and  oriented x4;  grossly normal  neurologically. ? ?Impression/Plan: ?Fernando Vega is here for an colonoscopy to be performed for Screening colonoscopy average risk   ?Risks, benefits, limitations, and alternatives regarding  colonoscopy have been reviewed with the patient.  Questions have been answered.  All parties agreeable. ? ? ?Jonathon Bellows, MD  07/19/2021, 9:51 AM ? ?

## 2021-07-19 NOTE — Op Note (Signed)
[facility] ?Gastroenterology ?Patient Name: Fernando Vega ?Procedure Date: 07/19/2021 9:46 AM ?MRN: [id number] ?Account #: [id number] ?Date of Birth: May 13, 1970 ?Admit Type: Outpatient ?Age: 51 ?Room: [facility] ENDO ROOM 2 ?Gender: Male ?Note Status: Finalized ?Instrument Name: Colonscope [id number] ?Procedure:             Colonoscopy ?Indications:           Screening for colorectal malignant neoplasm ?Providers:             Jonathon Bellows MD, MD ?Referring MD:          Forest Gleason Md, MD (Referring MD) ?Medicines:             Monitored Anesthesia Care ?Complications:         No immediate complications. ?Procedure:             Pre-Anesthesia Assessment: ?                       - Prior to the procedure, a History and Physical was  ?                       performed, and patient medications, allergies and  ?                       sensitivities were reviewed. The patient's tolerance  ?                       of previous anesthesia was reviewed. ?                       - The risks and benefits of the procedure and the  ?                       sedation options and risks were discussed with the  ?                       patient. All questions were answered and informed  ?                       consent was obtained. ?                       - ASA Grade Assessment: II - A patient with mild  ?                       systemic disease. ?                       After obtaining informed consent, the colonoscope was  ?                       passed under direct vision. Throughout the procedure,  ?                       the patient's blood pressure, pulse, and oxygen  ?                       saturations were monitored continuously. The  ?                       Colonoscope was  introduced through the anus and  ?                       advanced to the the cecum, identified by the  ?                       appendiceal orifice. The colonoscopy was performed  ?                       with ease. The patient tolerated the procedure well.  ?                        The quality of the bowel preparation was excellent. ?Findings: ?     The perianal and digital rectal examinations were normal. ?     A 30 mm polyp was found in the distal ascending colon. The polyp was  ?     sessile. Preparations were made for mucosal resection. [place] was  ?     injected to raise the lesion. Piecemeal mucosal resection using a snare  ?     was performed. Resection and retrieval were complete. To prevent  ?     bleeding after mucosal resection, four hemostatic clips were  ?     successfully placed. There was no bleeding during, or at the end, of the  ?     procedure. Borders of polyp marked using blue or nbi light Fulguration  ?     to ablate the remaining base of the lesion by snare was successful. ?     Six sessile polyps were found in the ascending colon. The polyps were 5  ?     to 7 mm in size. These polyps were removed with a cold snare. Resection  ?     and retrieval were complete. ?     Three sessile polyps were found in the rectum, sigmoid colon and  ?     transverse colon. The polyps were 4 to 6 mm in size. These polyps were  ?     removed with a cold snare. Resection and retrieval were complete. ?     The exam was otherwise without abnormality on direct and retroflexion  ?     views. ?Impression:            - One 30 mm polyp in the distal ascending colon,  ?                       removed with mucosal resection. Resected and  ?                       retrieved. Clips were placed. ?                       - Six 5 to 7 mm polyps in the ascending colon, removed  ?                       with a cold snare. Resected and retrieved. ?                       - Three 4 to 6 mm polyps in the rectum, in the sigmoid  ?  colon and in the transverse colon, removed with a cold  ?                       snare. Resected and retrieved. ?                       - The examination was otherwise normal on direct and  ?                       retroflexion views. ?                        - Mucosal resection was performed. Resection and  ?                       retrieval were complete. ?Recommendation:        - Discharge patient to home (with escort). ?                       - Resume previous diet. ?                       - Continue present medications. ?                       - Await pathology results. ?                       - Repeat colonoscopy in 6 months for surveillance  ?                       after piecemeal polypectomy. ?Procedure Code(s):     --- Professional --- ?                       [phone], Colonoscopy, flexible; with endoscopic mucosal  ?                       resection ?                       07371, 59, Colonoscopy, flexible; with removal of  ?                       tumor(s), polyp(s), or other lesion(s) by snare  ?                       technique ?Diagnosis Code(s):     --- Professional --- ?                       Z12.11, Encounter for screening for malignant neoplasm  ?                       of colon ?                       K63.5, Polyp of colon ?                       K62.1, Rectal polyp ?CPT copyright 2019 [employer] Medical [employer]. All rights reserved. ?The codes documented in this report are preliminary and upon coder review may  ?be revised  to meet current compliance requirements. ?Jonathon Bellows, MD ?Jonathon Bellows MD, MD ?07/19/2021 10:33:55 AM ?This report has been signed electronically. ?Number of Addenda: 0 ?Note Initiated On: 07/19/2021 9:46 AM ?Scope Withdrawal Time: 0 hours 24 minutes 8 seconds  ?Total Procedure Duration: 0 hours 30 minutes 44 seconds  ?Estimated Blood Loss:  Estimated blood loss: none. ?     [facility] ?

## 2021-07-19 NOTE — Anesthesia Postprocedure Evaluation (Signed)
Anesthesia Post Note ? ?Patient: Fernando Vega ? ?Procedure(s) Performed: COLONOSCOPY WITH PROPOFOL ? ?Patient location during evaluation: PACU ?Anesthesia Type: General ?Level of consciousness: awake and alert ?Pain management: pain level controlled ?Vital Signs Assessment: post-procedure vital signs reviewed and stable ?Respiratory status: spontaneous breathing, nonlabored ventilation, respiratory function stable and patient connected to nasal cannula oxygen ?Cardiovascular status: blood pressure returned to baseline and stable ?Postop Assessment: no apparent nausea or vomiting ?Anesthetic complications: no ? ? ?No notable events documented. ? ? ?Last Vitals:  ?Vitals:  ? 07/19/21 1038 07/19/21 1039  ?BP:  125/76  ?Pulse:    ?Resp: 18   ?Temp:  (!) 35.8 ?C  ?SpO2:    ?  ?Last Pain:  ?Vitals:  ? 07/19/21 1039  ?TempSrc: Temporal  ?PainSc: 0-No pain  ? ? ?  ?  ?  ?  ?  ?  ? ?Molli Barrows ? ? ? ? ?

## 2021-07-19 NOTE — Transfer of Care (Signed)
Immediate Anesthesia Transfer of Care Note ? ?Patient: Fernando Vega ? ?Procedure(s) Performed: COLONOSCOPY WITH PROPOFOL ? ?Patient Location: PACU ? ?Anesthesia Type:General ? ?Level of Consciousness: drowsy ? ?Airway & Oxygen Therapy: Patient Spontanous Breathing ? ?Post-op Assessment: Report given to RN and Post -op Vital signs reviewed and stable ? ?Post vital signs: Reviewed and stable ? ?Last Vitals:  ?Vitals Value Taken Time  ?BP    ?Temp    ?Pulse 86 07/19/21 1034  ?Resp 0 07/19/21 1034  ?SpO2 97 % 07/19/21 1034  ?Vitals shown include unvalidated device data. ? ?Last Pain:  ?Vitals:  ? 07/19/21 0924  ?TempSrc: Temporal  ?   ? ?  ? ?Complications: No notable events documented. ?

## 2021-07-19 NOTE — Anesthesia Preprocedure Evaluation (Signed)
Anesthesia Evaluation  ?Patient identified by MRN, date of birth, ID band ?Patient awake ? ? ? ?Reviewed: ?Allergy & Precautions, H&P , NPO status , Patient's Chart, lab work & pertinent test results, reviewed documented beta blocker date and time  ? ?Airway ?Mallampati: II ? ? ?Neck ROM: full ? ? ? Dental ? ?(+) Poor Dentition ?  ?Pulmonary ?sleep apnea and Continuous Positive Airway Pressure Ventilation ,  ?  ?Pulmonary exam normal ? ? ? ? ? ? ? Cardiovascular ?Exercise Tolerance: Good ?hypertension, On Medications ?negative cardio ROS ?Normal cardiovascular exam ?Rhythm:regular Rate:Normal ? ? ?  ?Neuro/Psych ? Neuromuscular disease negative psych ROS  ? GI/Hepatic ?negative GI ROS, Neg liver ROS,   ?Endo/Other  ?negative endocrine ROSdiabetes, Well Controlled ? Renal/GU ?negative Renal ROS  ?negative genitourinary ?  ?Musculoskeletal ? ? Abdominal ?  ?Peds ? Hematology ? ?(+) Blood dyscrasia, anemia ,   ?Anesthesia Other Findings ?Past Medical History: ?No date: Diabetes mellitus without complication ([place]) ?No date: Hypertension ?No date: Neuropathy ?No date: Sarcoidosis ?No date: Sleep apnea ?Past Surgical History: ?12/13/2020: AMPUTATION; Right ?    Comment:  Procedure: AMPUTATION midFOOT-RIGHT;  Surgeon: Sherryle Lis, ?             Stephan Minister, DPM;  Location: [facility] ORS;  Service: Podiatry;   ?             Laterality: Right; ?11/05/2020: AMPUTATION; Right ?    Comment:  Procedure: AMPUTATION RAY-Partial Ray Amp 2nd & 3rd  ?             Digit;  Surgeon: Edrick Kins, DPM;  Location: [facility]  ?             ORS;  Service: Podiatry;  Laterality: Right; ?08/24/4313: APPLICATION OF WOUND VAC; Right ?    Comment:  Procedure: APPLICATION OF WOUND VAC;  Surgeon: Amalia Hailey,  ?             Dorathy Daft, DPM;  Location: [facility]S;  Service: Podiatry;   ?             Laterality: Right; ?01/08/2021: BONE BIOPSY; Right ?    Comment:  Procedure: BONE BIOPSY;  Surgeon: Trula Slade,  ?             DPM;   Location: [facility] ORS;  Service: Podiatry;   ?             Laterality: Right; ?11/01/2020: INCISION AND DRAINAGE; Right ?    Comment:  Procedure: INCISION AND DRAINAGE-Right Foot;  Surgeon:  ?             Criselda Peaches, DPM;  Location: [facility];  Service:  ?             Podiatry;  Laterality: Right; ?11/09/2020: IRRIGATION AND DEBRIDEMENT FOOT; Right ?    Comment:  Procedure: IRRIGATION AND DEBRIDEMENT FOOT;  Surgeon:  ?             Edrick Kins, DPM;  Location: [facility];  Service:  ?             Podiatry;  Laterality: Right; ?12/13/2020: IRRIGATION AND DEBRIDEMENT FOOT; Right ?    Comment:  Procedure: IRRIGATION AND DEBRIDEMENT FOOT;  Surgeon:  ?             Criselda Peaches, DPM;  Location: [facility];  Service:  ?             Podiatry;  Laterality: Right; ?01/08/2021: TRANSMETATARSAL AMPUTATION; Right ?    Comment:  Procedure: LISFRANC  AMPUTATION;  Surgeon: Jacqualyn Posey,  ?             Bonna Gains, DPM;  Location: [facility];  Service: Podiatry;  ?             Laterality: Right; ?12/16/2020: WOUND DEBRIDEMENT; Right ?    Comment:  Procedure: DEBRIDEMENT WOUND;  Surgeon: Felipa Furnace,  ?             DPM;  Location: [facility];  Service: Podiatry;   ?             Laterality: Right;  Transmet amputation washout & closure ?01/08/2021: WOUND DEBRIDEMENT; Right ?    Comment:  Procedure: DEBRIDEMENT WOUND;  Surgeon: Celesta Gentile ?             Alfonso Patten, DPM;  Location: [facility];  Service: Podiatry;   ?             Laterality: Right; ?BMI   ? Body Mass Index: 29.29 kg/m?  ?  ? Reproductive/Obstetrics ?negative OB ROS ? ?  ? ? ? ? ? ? ? ? ? ? ? ? ? ?  ?  ? ? ? ? ? ? ? ? ?Anesthesia Physical ?Anesthesia Plan ? ?ASA: 3 ? ?Anesthesia Plan: General  ? ?Post-op Pain Management:   ? ?Induction:  ? ?PONV Risk Score and Plan:  ? ?Airway Management Planned:  ? ?Additional Equipment:  ? ?Intra-op Plan:  ? ?Post-operative Plan:  ? ?Informed Consent: I have reviewed the patients History and Physical, chart, labs and discussed the procedure including  the risks, benefits and alternatives for the proposed anesthesia with the patient or authorized representative who has indicated his/her understanding and acceptance.  ? ? ? ?Dental Advisory Given ? ?Plan Discussed with: CRNA ? ?Anesthesia Plan Comments:   ? ? ? ? ? ? ?Anesthesia Quick Evaluation ? ?

## 2021-07-20 ENCOUNTER — Encounter: Payer: Self-pay | Admitting: Gastroenterology

## 2021-07-20 LAB — SURGICAL PATHOLOGY

## 2021-07-25 ENCOUNTER — Encounter: Payer: Self-pay | Admitting: Gastroenterology

## 2021-07-28 ENCOUNTER — Telehealth: Payer: Self-pay | Admitting: Gastroenterology

## 2021-07-28 NOTE — Telephone Encounter (Signed)
Pt left message to get results of colonscopy

## 2021-07-29 NOTE — Telephone Encounter (Signed)
Called patient back and was not able to leave him a voicemail. However, a letter with his results and recommendations are in the letter. Patient is also placed in the recall list to call him in 6 months to repeat his colonoscopy.

## 2021-08-01 NOTE — Telephone Encounter (Signed)
Patient calling requesting results for colonoscopy. Requesting call back.

## 2021-08-02 NOTE — Telephone Encounter (Signed)
Called patient back and was able to speak with him and told him his pathology results and Dr. Georgeann Oppenheim recommendations. Patient agreed. He was put in our recall list for a 6 months repeat colonoscopy.

## 2021-08-02 NOTE — Telephone Encounter (Signed)
Patient left vm requesting a call back for colonoscopy results.

## 2021-08-15 ENCOUNTER — Emergency Department
Admission: EM | Admit: 2021-08-15 | Discharge: 2021-08-15 | Disposition: A | Discharge status: Home / Self Care | Payer: Medicaid Other | Attending: Emergency Medicine | Admitting: Emergency Medicine

## 2021-08-15 ENCOUNTER — Other Ambulatory Visit: Payer: Self-pay

## 2021-08-15 ENCOUNTER — Emergency Department: Payer: Medicaid Other

## 2021-08-15 DIAGNOSIS — J069 Acute upper respiratory infection, unspecified: Secondary | ICD-10-CM | POA: Insufficient documentation

## 2021-08-15 DIAGNOSIS — R0981 Nasal congestion: Secondary | ICD-10-CM | POA: Diagnosis present

## 2021-08-15 MED ORDER — CETIRIZINE HCL 10 MG PO TABS: 10.0000 mg | ORAL_TABLET | Freq: Every day | ORAL | 0 refills | Status: DC

## 2021-08-15 MED ORDER — BENZONATATE 100 MG PO CAPS: ORAL_CAPSULE | ORAL | 0 refills | Status: DC

## 2021-08-15 NOTE — ED Notes (Signed)
See triage note  presents with nasal congestion,runny nose and cough  denies any fever

## 2021-08-15 NOTE — ED Provider Notes (Signed)
[facility] Provider Note    Event Date/Time   First MD Initiated Contact with Patient 08/15/21 [phone]     (approximate)   History   URI   HPI  Fernando Vega is a 51 y.o. male   presents to the ED with complaint of sneezing, congestion, headache and itchy eyes for the past week.  Patient denies any fever, chills, nausea or vomiting.  He denies any known exposure to anyone with COVID or influenza.  Patient has not taken any over-the-counter medication as he was afraid of them due to his diabetes.  In addition to diabetes patient has a history of hypertension, neuropathy, sarcoidosis and sleep apnea.      Physical Exam   Triage Vital Signs: ED Triage Vitals  Enc Vitals Group     BP 08/15/21 0757 (!) 144/100     Pulse Rate 08/15/21 0756 89     Resp 08/15/21 0756 17     Temp 08/15/21 0756 97.6 F (36.4 C)     Temp Source 08/15/21 0756 Oral     SpO2 08/15/21 0756 99 %     Weight 08/15/21 0755 190 lb (86.2 kg)     Height 08/15/21 0755 '5\' 7"'$  (1.702 m)     Head Circumference --      Peak Flow --      Pain Score 08/15/21 0754 10     Pain Loc --      Pain Edu? --      Excl. in [place]? --     Most recent vital signs: Vitals:   08/15/21 0757 08/15/21 0842  BP: (!) 144/100 (!) 138/98  Pulse:  88  Resp:  16  Temp:    SpO2:  99%     General: Awake, no distress.  Able to talk in complete sentences without any difficulty. CV:  Good peripheral perfusion.  Regular rate and rhythm. Resp:  Normal effort.  Lungs are clear bilaterally. Abd:  No distention.  Other:  Nasal congestion present.  Posterior pharynx without erythema.  No cervical lymphadenopathy present.   ED Results / Procedures / Treatments   Labs (all labs ordered are listed, but only abnormal results are displayed) Labs Reviewed - No data to display     RADIOLOGY Chest x-ray 2 view images were reviewed and interpreted by myself prior to radiology report with no infiltrate noted.   Radiology report confirms no acute cardiopulmonary disease but does mention a mild wedge compression at T11.    PROCEDURES:  Critical Care performed:   Procedures   MEDICATIONS ORDERED IN ED: Medications - No data to display   IMPRESSION / MDM / [place] / ED COURSE  I reviewed the triage vital signs and the nursing notes.   Differential diagnosis includes, but is not limited to, upper respiratory infection, allergic rhinitis, influenza, bronchitis, pneumonia.  51 year old male presents to the ED with complaint of upper respiratory symptoms x1 week.  Patient states he has not taken any over-the-counter medication due to his diabetes he was uncertain what he could take.  Patient was noted to have nasal congestion with an occasional cough but otherwise unremarkable.  Chest x-ray was reassuring and patient was made aware that he does not have any signs of pneumonia.  A prescription for Zyrtec 10 mg 1 daily for allergy symptoms and also Tessalon Perles 1 or 2 every 8 hours as needed for cough was sent to his pharmacy.  He is encouraged to follow-up  with his PCP if any continued problems.      Patient's presentation is most consistent with acute, uncomplicated illness.  FINAL CLINICAL IMPRESSION(S) / ED DIAGNOSES   Final diagnoses:  Upper respiratory tract infection, unspecified type     Rx / DC Orders   ED Discharge Orders          Ordered    benzonatate (TESSALON PERLES) 100 MG capsule        08/15/21 0953    cetirizine (ZYRTEC ALLERGY) 10 MG tablet  Daily        08/15/21 0953             Note:  This document was prepared using Dragon voice recognition software and may include unintentional dictation errors.   [name], PA-C 08/15/21 1332    Lavonia Drafts, MD 08/15/21 1341

## 2021-08-15 NOTE — ED Triage Notes (Signed)
Pt c/o sneezing, itching, HA for the past week. Pt is in NAD on arrival.

## 2021-08-15 NOTE — Discharge Instructions (Signed)
Follow-up with your primary care provider if any continued problems or concerns.  Also have them recheck your blood pressure as it was elevated in the emergency department.  Continue taking your regular medications as prescribed by your doctor.  2 prescriptions were sent to the pharmacy 1 is for cough the other is for allergies including itching of your eyes.  Neither of these medications will cause drowsiness and increase your risk for falling.  Increase fluids to stay hydrated.

## 2021-09-04 ENCOUNTER — Other Ambulatory Visit: Payer: Self-pay

## 2021-09-04 ENCOUNTER — Emergency Department
Admission: EM | Admit: 2021-09-04 | Discharge: 2021-09-04 | Disposition: A | Discharge status: Home / Self Care | Payer: Medicaid Other | Attending: Emergency Medicine | Admitting: Emergency Medicine

## 2021-09-04 ENCOUNTER — Encounter: Payer: Self-pay | Admitting: Emergency Medicine

## 2021-09-04 DIAGNOSIS — Z794 Long term (current) use of insulin: Secondary | ICD-10-CM | POA: Insufficient documentation

## 2021-09-04 DIAGNOSIS — S20469A Insect bite (nonvenomous) of unspecified back wall of thorax, initial encounter: Secondary | ICD-10-CM | POA: Insufficient documentation

## 2021-09-04 DIAGNOSIS — W57XXXA Bitten or stung by nonvenomous insect and other nonvenomous arthropods, initial encounter: Secondary | ICD-10-CM

## 2021-09-04 DIAGNOSIS — I10 Essential (primary) hypertension: Secondary | ICD-10-CM | POA: Insufficient documentation

## 2021-09-04 DIAGNOSIS — E1140 Type 2 diabetes mellitus with diabetic neuropathy, unspecified: Secondary | ICD-10-CM | POA: Diagnosis not present

## 2021-09-04 LAB — CBG MONITORING, ED: Glucose-Capillary: 243 mg/dL — ABNORMAL HIGH (ref 70–99)

## 2021-09-04 MED ORDER — HYDROXYZINE HCL 25 MG PO TABS: 25.0000 mg | ORAL_TABLET | Freq: Four times a day (QID) | ORAL | 0 refills | Status: DC | PRN

## 2021-09-04 MED ORDER — TRIAMCINOLONE ACETONIDE 0.1 % EX CREA: 1.0000 | TOPICAL_CREAM | Freq: Two times a day (BID) | CUTANEOUS | 0 refills | Status: DC

## 2021-09-04 MED ORDER — CEPHALEXIN 500 MG PO CAPS: 500.0000 mg | ORAL_CAPSULE | Freq: Three times a day (TID) | ORAL | 0 refills | Status: DC

## 2021-09-04 MED ORDER — DEXAMETHASONE SODIUM PHOSPHATE 10 MG/ML IJ SOLN
10.0000 mg | Freq: Once | INTRAMUSCULAR | Status: AC
  Administered 2021-09-04: 10 mg via INTRAMUSCULAR
  Filled 2021-09-04: qty 1

## 2021-09-04 NOTE — ED Provider Notes (Signed)
[facility] Provider Note    Event Date/Time   First MD Initiated Contact with Patient 09/04/21 [phone]     (approximate)   History   Insect Bite   HPI  Fernando Vega is a 50 y.o. male   presents to the ED with complaint of an insect bite to his back that occurred approximately 5 days ago.  Patient is on aware of what type of insect bit him but he has developed some redness and is itching almost constantly.  Patient states that it is painful at night to where he cannot lie on his back.  He denies any fever or chills.  Patient does have type 2 diabetes and currently using insulin which she has not taken today.  He also does not adhere to a diabetic diet.  Patient has history of hypertension, neuropathy, sarcoidosis and sleep apnea.      Physical Exam   Triage Vital Signs: ED Triage Vitals  Enc Vitals Group     BP 09/04/21 0731 101/70     Pulse Rate 09/04/21 0731 90     Resp 09/04/21 0731 18     Temp 09/04/21 0731 97.7 F (36.5 C)     Temp Source 09/04/21 0731 Oral     SpO2 09/04/21 0731 97 %     Weight 09/04/21 0733 192 lb (87.1 kg)     Height 09/04/21 0733 5\' 7"  (1.702 m)     Head Circumference --      Peak Flow --      Pain Score 09/04/21 0733 10     Pain Loc --      Pain Edu? --      Excl. in [place]? --     Most recent vital signs: Vitals:   09/04/21 0731  BP: 101/70  Pulse: 90  Resp: 18  Temp: 97.7 F (36.5 C)  SpO2: 97%     General: Awake, no distress.  CV:  Good peripheral perfusion.  Resp:  Normal effort.  Abd:  No distention.  Other:  On examination of the back there is a single papule with an erythematous area surrounding approximately 2-1/2 cm in diameter.  No purulent drainage is noted.  No other areas are affected and no vesicles are seen.   ED Results / Procedures / Treatments   Labs (all labs ordered are listed, but only abnormal results are displayed) Labs Reviewed  CBG MONITORING, ED - Abnormal; Notable for the  following components:      Result Value   Glucose-Capillary 243 (*)    All other components within normal limits       PROCEDURES:  Critical Care performed:   Procedures   MEDICATIONS ORDERED IN ED: Medications  dexamethasone (DECADRON) injection 10 mg (10 mg Intramuscular Given 09/04/21 1017)     IMPRESSION / MDM / ASSESSMENT AND PLAN / ED COURSE  I reviewed the triage vital signs and the nursing notes.   Differential diagnosis includes, but is not limited to, insect bite, infection, cellulitis, localized allergic reaction.  51 year old male presents to the ED with complaint of an insect bite that occurred approximately 5 days ago.  She does have type 2 diabetes and is using insulin however he does not have great control of his diabetes.  Patient complains of a great deal of itching which is keeping him up at night and is unrelieved with topical creams.  Area appears to be an insect bite of some description that is now getting  infected.  I spoke with him about his diabetic control and the need for him to adhere to his diabetic diet which he has not done in the last several weeks.  Glucose fingerstick in the ED was 243.  Patient was made aware.  He is also to make an appointment with his PCP to discuss better control of his diabetes.  He was given Decadron 10 mg IM after discussing his diabetes.  He is to monitor his blood sugars and also adhere to his diabetic diet as he was made aware that the Decadron could increase his blood sugars temporarily.  He was also given a prescription for Keflex 500 mg and Atarax 25 mg as needed for itching.  A prescription for triamcinolone cream to apply to the area directly to also help with itching.      Patient's presentation is most consistent with acute, uncomplicated illness.  FINAL CLINICAL IMPRESSION(S) / ED DIAGNOSES   Final diagnoses:  Bug bite with infection, initial encounter     Rx / DC Orders   ED Discharge Orders           Ordered    hydrOXYzine (ATARAX) 25 MG tablet  Every 6 hours PRN        09/04/21 1004    triamcinolone cream (KENALOG) 0.1 %  2 times daily        09/04/21 1004    cephALEXin (KEFLEX) 500 MG capsule  3 times daily        09/04/21 1004             Note:  This document was prepared using Dragon voice recognition software and may include unintentional dictation errors.   Tommi Rumps, PA-C 09/04/21 1212    Chesley Noon, MD 09/05/21 [phone]

## 2021-09-12 ENCOUNTER — Encounter: Payer: Self-pay | Admitting: Emergency Medicine

## 2021-09-12 ENCOUNTER — Other Ambulatory Visit: Payer: Self-pay

## 2021-09-12 ENCOUNTER — Emergency Department
Admission: EM | Admit: 2021-09-12 | Discharge: 2021-09-12 | Disposition: A | Discharge status: Home / Self Care | Payer: Medicaid Other | Attending: Emergency Medicine | Admitting: Emergency Medicine

## 2021-09-12 DIAGNOSIS — I10 Essential (primary) hypertension: Secondary | ICD-10-CM | POA: Insufficient documentation

## 2021-09-12 DIAGNOSIS — R222 Localized swelling, mass and lump, trunk: Secondary | ICD-10-CM | POA: Diagnosis present

## 2021-09-12 DIAGNOSIS — E1140 Type 2 diabetes mellitus with diabetic neuropathy, unspecified: Secondary | ICD-10-CM | POA: Diagnosis not present

## 2021-09-12 DIAGNOSIS — L02212 Cutaneous abscess of back [any part, except buttock]: Secondary | ICD-10-CM | POA: Insufficient documentation

## 2021-09-12 DIAGNOSIS — L0291 Cutaneous abscess, unspecified: Secondary | ICD-10-CM

## 2021-09-12 MED ORDER — LIDOCAINE-EPINEPHRINE (PF) 2 %-1:200000 IJ SOLN
INTRAMUSCULAR | Status: AC
  Filled 2021-09-12: qty 20

## 2021-09-12 MED ORDER — LIDOCAINE-EPINEPHRINE 2 %-1:100000 IJ SOLN
20.0000 mL | Freq: Once | INTRAMUSCULAR | Status: AC
  Administered 2021-09-12: 20 mL via INTRADERMAL
  Filled 2021-09-12: qty 1

## 2021-09-12 MED ORDER — SULFAMETHOXAZOLE-TRIMETHOPRIM 800-160 MG PO TABS: 1.0000 | ORAL_TABLET | Freq: Two times a day (BID) | ORAL | 0 refills | Status: DC

## 2021-09-12 MED ORDER — HYDROCODONE-ACETAMINOPHEN 5-325 MG PO TABS: 1.0000 | ORAL_TABLET | Freq: Four times a day (QID) | ORAL | 0 refills | Status: AC | PRN

## 2021-09-12 NOTE — ED Provider Notes (Signed)
[facility] Provider Note    Event Date/Time   First MD Initiated Contact with Patient 09/12/21 [phone]     (approximate)   History   Insect Bite   HPI  Fernando Vega is a 51 y.o. male with significant past medical history of diabetes and as listed below, presents to the emergency department for treatment and evaluation of sore area on his back thought to have been from an insect bite. He was evaluated here on 09/04/21 for the same and treated with Keflex. Area continues to get larger in size and more painful. He has taken his medication as prescribed. No fever.    Past Medical History:  Diagnosis Date   Diabetes mellitus without complication (HCC)    Hypertension    Neuropathy    Sarcoidosis    Sleep apnea      Physical Exam   Triage Vital Signs: ED Triage Vitals  Enc Vitals Group     BP 09/12/21 0630 125/77     Pulse Rate 09/12/21 0630 92     Resp 09/12/21 0630 17     Temp 09/12/21 0630 98 F (36.7 C)     Temp Source 09/12/21 0630 Oral     SpO2 09/12/21 0630 97 %     Weight 09/12/21 0729 191 lb 12.8 oz (87 kg)     Height 09/12/21 0729 '5\' 7"'$  (1.702 m)     Head Circumference --      Peak Flow --      Pain Score 09/12/21 0747 0     Pain Loc --      Pain Edu? --      Excl. in [place]? --     Most recent vital signs: Vitals:   09/12/21 0630  BP: 125/77  Pulse: 92  Resp: 17  Temp: 98 F (36.7 C)  SpO2: 97%    General: Awake, no distress.  CV:  Good peripheral perfusion.  Resp:  Normal effort.  Abd:  No distention.  Other:  Approximately 4 cm area of erythema with central pustular scab. Central area of fluctuance with surrounding induration.   ED Results / Procedures / Treatments   Labs (all labs ordered are listed, but only abnormal results are displayed) Labs Reviewed - No data to display   EKG  Not indicated.   RADIOLOGY  Not indicated.  I have independently reviewed and interpreted imaging as well as reviewed report  from radiology.  PROCEDURES:  Critical Care performed: No  ..Incision and Drainage  Date/Time: 09/12/2021 8:08 AM  Performed by: Victorino Dike, FNP Authorized by: Victorino Dike, FNP   Consent:    Consent obtained:  Verbal   Consent given by:  Patient   Risks discussed:  Incomplete drainage and pain Location:    Type:  Abscess   Location:  Trunk   Trunk location:  Back Pre-procedure details:    Skin preparation:  Povidone-iodine Anesthesia:    Anesthesia method:  Local infiltration   Local anesthetic:  Lidocaine 2% WITH epi Procedure type:    Complexity:  Complex Procedure details:    Incision types:  Single straight   Incision depth:  Dermal   Wound management:  Probed and deloculated   Drainage:  Bloody and purulent   Drainage amount:  Moderate   Wound treatment:  Wound left open   Packing materials:  1/4 in iodoform gauze Post-procedure details:    Procedure completion:  Tolerated well, no immediate complications  MEDICATIONS ORDERED IN ED:  Medications  lidocaine-EPINEPHrine (XYLOCAINE W/EPI) 2 %-1:200000 (PF) injection (  Not Given 09/12/21 0740)  lidocaine-EPINEPHrine (XYLOCAINE W/EPI) 2 %-1:100000 (with pres) injection 20 mL (20 mLs Intradermal Given 09/12/21 0739)     IMPRESSION / MDM / ASSESSMENT AND PLAN / ED COURSE   I reviewed the triage vital signs and the nursing notes.  Differential diagnosis includes, but is not limited to: abscess;   Patient's presentation is most consistent with acute, uncomplicated illness.  51 year old male presenting to the emergency department for treatment and evaluation of insect bite to his back.  See HPI for further details.  Area appears to me like an infected sebaceous cyst, however the patient states that he felt something sting or bite his back.  Incision and drainage performed as described above.  He had been on Keflex but he will be switched to Bactrim so it will have more MRSA coverage.  A short supply of Norco  given as well.  Patient encouraged to only take it if needed and not to drive or use any type of machinery while taking it.  Patient was encouraged to see primary care if not improving over the next 2 to 3 days.  If unable to be evaluated by primary care he is to return to the emergency department.      FINAL CLINICAL IMPRESSION(S) / ED DIAGNOSES   Final diagnoses:  Abscess     Rx / DC Orders   ED Discharge Orders          Ordered    sulfamethoxazole-trimethoprim (BACTRIM DS) 800-160 MG tablet  2 times daily        09/12/21 0741    HYDROcodone-acetaminophen (NORCO/VICODIN) 5-325 MG tablet  Every 6 hours PRN        09/12/21 0741             Note:  This document was prepared using Dragon voice recognition software and may include unintentional dictation errors.   Victorino Dike, FNP 09/12/21 2778    Carrie Mew, MD 09/12/21 [phone]

## 2021-09-12 NOTE — Discharge Instructions (Signed)
Change the bandage daily or if it is saturated.  Follow up with primary care if not improving over the next 2-3 days. If unable to get an appointment and you are not improving, return to the ER.

## 2021-09-12 NOTE — ED Triage Notes (Addendum)
Pt to ED to have insect bite from 6/25 reassessed due to continued pain and concern due to his hx/o DM and wound healing. Pt has raised, reddened area to posterior mid back with no streaking noted. Pt denies drainage. Pt seen in ED on 6/25 for same.

## 2021-09-12 NOTE — ED Notes (Signed)
Area on back red sore itching. Pt states that he is taking meds as prescribed.

## 2021-10-05 ENCOUNTER — Telehealth: Payer: Self-pay | Admitting: Podiatry

## 2021-10-05 ENCOUNTER — Ambulatory Visit (INDEPENDENT_AMBULATORY_CARE_PROVIDER_SITE_OTHER): Payer: Medicaid Other | Admitting: Podiatry

## 2021-10-05 DIAGNOSIS — Z89431 Acquired absence of right foot: Secondary | ICD-10-CM | POA: Diagnosis not present

## 2021-10-05 DIAGNOSIS — M21961 Unspecified acquired deformity of right lower leg: Secondary | ICD-10-CM | POA: Diagnosis not present

## 2021-10-05 DIAGNOSIS — B351 Tinea unguium: Secondary | ICD-10-CM

## 2021-10-05 NOTE — Telephone Encounter (Signed)
Patient called back, he cant afford the orthotics at [facility] they are $300 is there somewhere cheaper he can go or some other substitute he can use.  Please advise

## 2021-10-09 NOTE — Progress Notes (Signed)
  Subjective:  Patient ID: Fernando Vega, male    DOB: 1970-12-17,  MRN: [id number]  Chief Complaint  Patient presents with   Foot Pain     right foot hx of amputation/ sore to walk on bottom, burning and painful   Nail Problem    Nail trim left foot    51 y.o. male presents with the above complaint. History confirmed with patient.  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 Left Foot: dystrophic yellowed discolored nail plates with subungual debris    Assessment:   1. Acquired deformity of right foot   2. Pain due to onychomycosis of toenail of left foot      Plan:  Patient was evaluated and treated and all questions answered.  For his amputation of the right foot I think he would benefit greatly from a multidensity insole with an amputation filler.  I wrote a prescription for this for [facility] for him.  Currently has varus deformity and equinus is mild.  We also discussed surgical correction with muscular tendon balancing of his amputation site if this does not improve.  Discussed the etiology and treatment options for the condition in detail with the patient. Educated patient on the topical and oral treatment options for mycotic nails. Recommended debridement of the nails today. Sharp and mechanical debridement performed of all painful and mycotic nails today. Nails debrided in length and thickness using a nail nipper to level of comfort. Discussed treatment options including appropriate shoe gear. Follow up as needed for painful nails.    Return if symptoms worsen or fail to improve.

## 2021-10-20 ENCOUNTER — Other Ambulatory Visit: Payer: Self-pay

## 2021-10-20 ENCOUNTER — Telehealth: Payer: Self-pay

## 2021-10-20 DIAGNOSIS — Z86010 Personal history of colonic polyps: Secondary | ICD-10-CM

## 2021-10-20 MED ORDER — NA SULFATE-K SULFATE-MG SULF 17.5-3.13-1.6 GM/177ML PO SOLN: 1.0000 | Freq: Once | ORAL | 0 refills | Status: AC

## 2021-10-20 NOTE — Telephone Encounter (Signed)
Gastroenterology Pre-Procedure Review  Request Date: 11/23/21 Requesting Physician: Dr. Vicente Males  PATIENT REVIEW QUESTIONS: The patient responded to the following health history questions as indicated:    1. Are you having any GI issues? no 2. Do you have a personal history of Polyps? no 3. Do you have a family history of Colon Cancer or Polyps? yes (dad colon cancer) 4. Diabetes Mellitus?(yes patient takes insulin) 5. Joint replacements in the past 12 months?no 6. Major health problems in the past 3 months?no 7. Any artificial heart valves, MVP, or defibrillator?no    MEDICATIONS & ALLERGIES:    Patient reports the following regarding taking any anticoagulation/antiplatelet therapy:   Plavix, Coumadin, Eliquis, Xarelto, Lovenox, Pradaxa, Brilinta, or Effient? no Aspirin? no  Patient confirms/reports the following medications:  Current Outpatient Medications  Medication Sig Dispense Refill   acetaminophen (TYLENOL) 500 MG tablet Take 1 tablet (500 mg total) by mouth every 6 (six) hours as needed for mild pain (or Fever >/= 101). 30 tablet 0   albuterol (VENTOLIN HFA) 108 (90 Base) MCG/ACT inhaler Inhale 2 puffs into the lungs once every 6 (six) hours as needed for wheezing or shortness of breath. 6.7 g 0   amLODipine (NORVASC) 10 MG tablet Take 1 tablet (10 mg total) by mouth once daily. 30 tablet 2   Ascorbic Acid (VITAMIN C WITH ROSE HIPS) 500 MG tablet Take 500 mg by mouth daily.     atorvastatin (LIPITOR) 20 MG tablet Take 20 mg by mouth daily.     benzonatate (TESSALON PERLES) 100 MG capsule 1-2 every 8 hours if needed cough 30 capsule 0   cetirizine (ZYRTEC ALLERGY) 10 MG tablet Take 1 tablet (10 mg total) by mouth daily. For allergy systems 14 tablet 0   gabapentin (NEURONTIN) 300 MG capsule Take 1 capsule (300 mg total) by mouth once daily at bedtime. 30 capsule 2   hydrOXYzine (ATARAX) 25 MG tablet Take 1 tablet (25 mg total) by mouth every 6 (six) hours as needed for itching. 30  tablet 0   Insulin Glargine (BASAGLAR KWIKPEN) 100 UNIT/ML Inject 15 Units into the skin at bedtime. 15 mL 3   insulin lispro (HUMALOG KWIKPEN) 100 UNIT/ML KwikPen Inject 7 Units into the skin 3 (three) times daily before meals. 15 mL 3   Insulin Pen Needle 32G X 6 MM MISC USE AS DIRECTED WITH INSULIN PENS. 100 each 1   Multiple Vitamin (MULTIVITAMIN WITH MINERALS) TABS tablet Take 1 tablet by mouth once daily. 30 tablet 0   pantoprazole (PROTONIX) 40 MG tablet Take 1 tablet (40 mg total) by mouth once daily. 30 tablet 2   sulfamethoxazole-trimethoprim (BACTRIM DS) 800-160 MG tablet Take 1 tablet by mouth 2 (two) times daily. 20 tablet 0   triamcinolone cream (KENALOG) 0.1 % Apply 1 Application topically 2 (two) times daily. 30 g 0   No current facility-administered medications for this visit.    Patient confirms/reports the following allergies:  No Known Allergies  No orders of the defined types were placed in this encounter.   AUTHORIZATION INFORMATION Primary Insurance: 1D#: Group #:  Secondary Insurance: 1D#: Group #:  SCHEDULE INFORMATION: Date: 11/23/21 Time: Location: [facility]

## 2021-11-20 ENCOUNTER — Other Ambulatory Visit: Payer: Self-pay

## 2021-11-20 ENCOUNTER — Emergency Department
Admission: EM | Admit: 2021-11-20 | Discharge: 2021-11-20 | Disposition: A | Discharge status: Home / Self Care | Payer: Medicaid Other | Attending: Emergency Medicine | Admitting: Emergency Medicine

## 2021-11-20 ENCOUNTER — Encounter: Payer: Self-pay | Admitting: Emergency Medicine

## 2021-11-20 DIAGNOSIS — I10 Essential (primary) hypertension: Secondary | ICD-10-CM | POA: Diagnosis not present

## 2021-11-20 DIAGNOSIS — E119 Type 2 diabetes mellitus without complications: Secondary | ICD-10-CM | POA: Diagnosis not present

## 2021-11-20 DIAGNOSIS — L03221 Cellulitis of neck: Secondary | ICD-10-CM | POA: Insufficient documentation

## 2021-11-20 DIAGNOSIS — L0211 Cutaneous abscess of neck: Secondary | ICD-10-CM | POA: Diagnosis present

## 2021-11-20 MED ORDER — CEPHALEXIN 500 MG PO CAPS: 500.0000 mg | ORAL_CAPSULE | Freq: Four times a day (QID) | ORAL | 0 refills | Status: AC

## 2021-11-20 MED ORDER — DOXYCYCLINE MONOHYDRATE 100 MG PO TABS: 100.0000 mg | ORAL_TABLET | Freq: Two times a day (BID) | ORAL | 0 refills | Status: AC

## 2021-11-20 NOTE — ED Triage Notes (Signed)
Abscess under chin x 4 days.  Area reddened with black center.

## 2021-11-20 NOTE — ED Provider Notes (Signed)
[facility] Provider Note    None    (approximate)   History   Abscess   HPI  Fernando Vega is a 51 y.o. male who presents today for evaluation of possible abscess.  Patient reports that he was shaving his neck a couple of days ago and thinks that he got an ingrown hair.  He reports that he has developed small amount of redness to the area.  He reports that he is diabetic and is concerned that this might get worse.  He denies any pain with moving his neck, swallowing, or chewing.  He has not noticed any swelling inside his mouth.  He has not had any fevers or chills.  He denies any drainage from the area.  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[place]) 01/07/2021   Acute osteomyelitis of right foot ([place]) 01/07/2021   Essential hypertension 12/30/2020   Sleep apnea 12/11/2020   Anemia 12/11/2020   Osteomyelitis ([place]) 12/11/2020   Encounter to establish care 12/02/2020   Leukocytosis    Puncture wound 11/01/2020   Type 2 diabetes mellitus with peripheral neuropathy ([place]) 11/01/2020   Necrotizing soft tissue infection 11/01/2020   Diabetic foot infection [facility])           Physical Exam   Triage Vital Signs: ED Triage Vitals [11/20/21 0724]  Enc Vitals Group     BP (!) 157/96     Pulse Rate 93     Resp 16     Temp 97.6 F (36.4 C)     Temp Source Oral     SpO2 96 %     Weight 191 lb 12.8 oz (87 kg)     Height '5\' 7"'$  (1.702 m)     Head Circumference      Peak Flow      Pain Score 9     Pain Loc      Pain Edu?      Excl. in [place]?     Most recent vital signs: Vitals:   11/20/21 0724  BP: (!) 157/96  Pulse: 93  Resp: 16  Temp: 97.6 F (36.4 C)  SpO2: 96%    Physical Exam Vitals and nursing note reviewed.  Constitutional:      General: Awake and  alert. No acute distress.    Appearance: Normal appearance. The patient is normal weight.  HENT:     Head: Normocephalic and atraumatic.     Mouth: Mucous membranes are moist.  Eyes:     General: PERRL. Normal EOMs        Right eye: No discharge.        Left eye: No discharge.     Conjunctiva/sclera: Conjunctivae normal.  Cardiovascular:     Rate and Rhythm: Normal rate and regular rhythm.     Pulses: Normal pulses.     Heart sounds: Normal heart sounds Pulmonary:     Effort: Pulmonary effort is normal. No respiratory distress.     Breath sounds: Normal breath sounds.  Abdominal:     Abdomen is soft. There is no abdominal tenderness. No rebound or guarding. No distention. Musculoskeletal:        General: No swelling. Normal range of motion.     Cervical back: Normal range of motion and neck supple.  Skin:  General: Skin is warm and dry.     Capillary Refill: Capillary refill takes less than 2 seconds.     Findings: 2 x 2 centimeter area of erythema with induration to the anterior neck.  There are 2 areas of clearing at the hair follicle site.  There is no fluctuance.  There is no swelling.  He has full and normal range of motion of his neck.  There is no intraoral swelling.  No sublingual swelling.  No tenderness to the floor of the mouth.  No crepitus.  Patient is able to fully flex and extend his neck without pain Neurological:     Mental Status: The patient is awake and alert.      ED Results / Procedures / Treatments   Labs (all labs ordered are listed, but only abnormal results are displayed) Labs Reviewed - No data to display   EKG     RADIOLOGY     PROCEDURES:  Critical Care performed:   Ultrasound ED Soft Tissue  Date/Time: 11/20/2021 7:55 AM  Performed by: Marquette Old, PA-C Authorized by: Marquette Old, PA-C   Procedure details:    Indications: localization of abscess and evaluate for cellulitis     Transverse view:  Visualized   Longitudinal  view:  Visualized   Images: not archived   Location:    Location: neck     Side:  Midline Findings:     no abscess present    cellulitis present    no foreign body present Comments:     No fluid collection, superficial, cobblestoning present    MEDICATIONS ORDERED IN ED: Medications - No data to display   IMPRESSION / MDM / [place] / ED COURSE  I reviewed the triage vital signs and the nursing notes.   Differential diagnosis includes, but is not limited to, cellulitis, abscess, folliculitis.  Patient is awake and alert, hemodynamically stable and afebrile.  He has no constitutional symptoms.  He has full and normal range of motion of his neck, no rigidity, no pain with movement.  No intraoral swelling or sublingual woodiness or tenderness.  No dental abnormalities.  Location is where he was shaving, I suspect cellulitis from shaving.  Do not suspect dental or intraoral etiology.  He has no difficulty opening and closing his mouth, swallowing, and breathing.  I ultrasounded the area and it demonstrates superficial cobblestoning without focal fluid collection.  Ultrasound and physical exam consistent with cellulitis likely from shaving.  I discussed with patient that he is high risk given his history of diabetes.  We discussed the option of labs, dose of IV antibiotics, and imaging to ensure not deeper space, however patient feels reassured by the ultrasound and would like to trial antibiotics only.  I have a low suspicion for deeper space infection given his lack of pain with moving his neck, no pain with swallowing, no intraoral abnormalities, and the superficial appearance of the cellulitis on ultrasound.  I also offered to outline the area so he can be able to tell if the area of redness is getting worse but he declined.  He was started on doxycycline and Keflex, and advised of the importance of starting these today.  We discussed very strict return precautions and I advised that  he have his wound rechecked tomorrow to ensure that it is improving and not worsening.  Patient understands that he is high risk, and understand strict return precautions.  He was discharged in stable condition.  Patient's presentation is most consistent with acute illness / injury with system symptoms.      FINAL CLINICAL IMPRESSION(S) / ED DIAGNOSES   Final diagnoses:  Cellulitis of neck     Rx / DC Orders   ED Discharge Orders          Ordered    doxycycline (ADOXA) 100 MG tablet  2 times daily        11/20/21 0748    cephALEXin (KEFLEX) 500 MG capsule  4 times daily        11/20/21 0748             Note:  This document was prepared using Dragon voice recognition software and may include unintentional dictation errors.   Marquette Old, PA-C 11/20/21 0759    Blake Divine, MD 11/20/21 [phone]

## 2021-11-20 NOTE — ED Notes (Signed)
See triage note  Presents with possible abscess area under chin    States he noticed this area about 3-4 days ago  Afebrile on arrival

## 2021-11-20 NOTE — Discharge Instructions (Signed)
It is very important that you take the antibiotics as prescribed.  Please return if the area of redness gets larger, more painful, if you develop pus, pain with moving your neck, pain with swallowing, swelling inside your mouth, fevers, or any other concerns.  You did not wish for me to outline this area so that you can pay closer attention to it.  Please have your wound rechecked in 1 day to ensure that it is not getting worse.  Take the antibiotics beginning today.  It was a pleasure caring for you today.

## 2021-11-23 ENCOUNTER — Encounter: Payer: Self-pay | Admitting: Gastroenterology

## 2021-11-23 ENCOUNTER — Ambulatory Visit
Admission: RE | Admit: 2021-11-23 | Discharge: 2021-11-23 | Disposition: A | Discharge status: Home / Self Care | Payer: Medicaid Other | Source: Ambulatory Visit | Attending: Gastroenterology | Admitting: Gastroenterology

## 2021-11-23 ENCOUNTER — Ambulatory Visit: Payer: Medicaid Other | Admitting: Certified Registered"

## 2021-11-23 ENCOUNTER — Other Ambulatory Visit: Payer: Self-pay

## 2021-11-23 ENCOUNTER — Encounter
Admission: RE | Disposition: A | Discharge status: Home / Self Care | Payer: Self-pay | Source: Ambulatory Visit | Attending: Gastroenterology

## 2021-11-23 DIAGNOSIS — Z1211 Encounter for screening for malignant neoplasm of colon: Secondary | ICD-10-CM | POA: Insufficient documentation

## 2021-11-23 DIAGNOSIS — J45909 Unspecified asthma, uncomplicated: Secondary | ICD-10-CM | POA: Diagnosis not present

## 2021-11-23 DIAGNOSIS — E1142 Type 2 diabetes mellitus with diabetic polyneuropathy: Secondary | ICD-10-CM | POA: Diagnosis not present

## 2021-11-23 DIAGNOSIS — Z860100 Personal history of colon polyps, unspecified: Secondary | ICD-10-CM

## 2021-11-23 DIAGNOSIS — Z833 Family history of diabetes mellitus: Secondary | ICD-10-CM | POA: Diagnosis not present

## 2021-11-23 DIAGNOSIS — I10 Essential (primary) hypertension: Secondary | ICD-10-CM | POA: Insufficient documentation

## 2021-11-23 DIAGNOSIS — Z794 Long term (current) use of insulin: Secondary | ICD-10-CM | POA: Insufficient documentation

## 2021-11-23 DIAGNOSIS — G4730 Sleep apnea, unspecified: Secondary | ICD-10-CM | POA: Insufficient documentation

## 2021-11-23 DIAGNOSIS — D869 Sarcoidosis, unspecified: Secondary | ICD-10-CM | POA: Insufficient documentation

## 2021-11-23 DIAGNOSIS — D126 Benign neoplasm of colon, unspecified: Secondary | ICD-10-CM

## 2021-11-23 DIAGNOSIS — Z89431 Acquired absence of right foot: Secondary | ICD-10-CM | POA: Diagnosis not present

## 2021-11-23 DIAGNOSIS — D122 Benign neoplasm of ascending colon: Secondary | ICD-10-CM | POA: Insufficient documentation

## 2021-11-23 DIAGNOSIS — Z8249 Family history of ischemic heart disease and other diseases of the circulatory system: Secondary | ICD-10-CM | POA: Insufficient documentation

## 2021-11-23 HISTORY — PX: COLONOSCOPY WITH PROPOFOL: SHX5780

## 2021-11-23 LAB — GLUCOSE, CAPILLARY: Glucose-Capillary: 210 mg/dL — ABNORMAL HIGH (ref 70–99)

## 2021-11-23 SURGERY — COLONOSCOPY WITH PROPOFOL
Anesthesia: General

## 2021-11-23 MED ORDER — LIDOCAINE HCL (CARDIAC) PF 100 MG/5ML IV SOSY
PREFILLED_SYRINGE | INTRAVENOUS | Status: DC | PRN
  Administered 2021-11-23: 100 mg via INTRAVENOUS

## 2021-11-23 MED ORDER — SODIUM CHLORIDE 0.9 % IV SOLN: INTRAVENOUS | Status: DC

## 2021-11-23 MED ORDER — PROPOFOL 10 MG/ML IV BOLUS
INTRAVENOUS | Status: DC | PRN
  Administered 2021-11-23: 70 mg via INTRAVENOUS

## 2021-11-23 MED ORDER — PROPOFOL 500 MG/50ML IV EMUL
INTRAVENOUS | Status: DC | PRN
  Administered 2021-11-23: 165 ug/kg/min via INTRAVENOUS

## 2021-11-23 NOTE — Anesthesia Procedure Notes (Signed)
Procedure Name: General with mask airway Date/Time: 11/23/2021 10:16 AM  Performed by: Kelton Pillar, CRNAPre-anesthesia Checklist: Patient identified and Emergency Drugs available Patient Re-evaluated:Patient Re-evaluated prior to induction Oxygen Delivery Method: Simple face mask Induction Type: IV induction Placement Confirmation: positive ETCO2, CO2 detector and breath sounds checked- equal and bilateral Dental Injury: Teeth and Oropharynx as per pre-operative assessment

## 2021-11-23 NOTE — Anesthesia Postprocedure Evaluation (Signed)
Anesthesia Post Note  Patient: Fernando Vega  Procedure(s) Performed: COLONOSCOPY WITH PROPOFOL  Patient location during evaluation: Endoscopy Anesthesia Type: General Level of consciousness: awake and alert Pain management: pain level controlled Vital Signs Assessment: post-procedure vital signs reviewed and stable Respiratory status: spontaneous breathing, nonlabored ventilation, respiratory function stable and patient connected to nasal cannula oxygen Cardiovascular status: blood pressure returned to baseline and stable Postop Assessment: no apparent nausea or vomiting Anesthetic complications: no   No notable events documented.   Last Vitals:  Vitals:   11/23/21 1033 11/23/21 1043  BP: 102/67 122/83  Pulse: 76 81  Resp: 12 15  Temp:    SpO2: 98% 93%    Last Pain:  Vitals:   11/23/21 1043  TempSrc:   PainSc: 0-No pain                 Ilene Qua

## 2021-11-23 NOTE — Op Note (Signed)
[facility] Gastroenterology Patient Name: Fernando Vega Procedure Date: 11/23/2021 10:02 AM MRN: [id number] Account #: [id number] Date of Birth: 1970-09-01 Admit Type: Outpatient Age: 51 Room: [facility] ENDO ROOM 2 Gender: Male Note Status: Finalized Instrument Name: Jasper Riling [id number] Procedure:             Colonoscopy Indications:           Surveillance: History of numerous (> 10) adenomas on                         last colonoscopy (< 3 yrs), Surveillance: History of                         piecemeal removal adenoma on last colonoscopy (< 3 yrs) Providers:             Jonathon Bellows MD, MD Referring MD:          [place]:             Monitored Anesthesia Care Complications:         No immediate complications. Procedure:             Pre-Anesthesia Assessment:                        - Prior to the procedure, a History and Physical was                         performed, and patient medications, allergies and                         sensitivities were reviewed. The patient's tolerance                         of previous anesthesia was reviewed.                        - The risks and benefits of the procedure and the                         sedation options and risks were discussed with the                         patient. All questions were answered and informed                         consent was obtained.                        - ASA Grade Assessment: II - A patient with mild                         systemic disease.                        After obtaining informed consent, the colonoscope was                         passed under direct vision. Throughout the procedure,  the patient's blood pressure, pulse, and oxygen                         saturations were monitored continuously. The                         Colonoscope was introduced through the anus and                         advanced to the the cecum, identified by the                          appendiceal orifice. The colonoscopy was performed                         with ease. The patient tolerated the procedure well.                         The quality of the bowel preparation was inadequate. Findings:      The perianal and digital rectal examinations were normal.      Two sessile polyps were found in the ascending colon. The polyps were 4       to 5 mm in size. These polyps were removed with a cold snare. Resection       and retrieval were complete.      A large amount of semi-liquid stool was found in the entire colon,       interfering with visualization.      recommend 2 days prep Impression:            - Preparation of the colon was inadequate.                        - Two 4 to 5 mm polyps in the ascending colon, removed                         with a cold snare. Resected and retrieved.                        - Stool in the entire examined colon. Recommendation:        - Discharge patient to home (with escort).                        - Resume previous diet.                        - Continue present medications.                        - Await pathology results.                        - Repeat colonoscopy in 4 months because the bowel                         preparation was suboptimal. Procedure Code(s):     --- Professional ---                        [phone], Colonoscopy, flexible; with removal of  tumor(s), polyp(s), or other lesion(s) by snare                         technique Diagnosis Code(s):     --- Professional ---                        K63.5, Polyp of colon                        Z86.010, Personal history of colonic polyps CPT copyright 2019 American [employer] Association. All rights reserved. The codes documented in this report are preliminary and upon coder review may  be revised to meet current compliance requirements. Jonathon Bellows, MD Jonathon Bellows MD, MD 11/23/2021 10:22:46 AM This report has been signed  electronically. Number of Addenda: 0 Note Initiated On: 11/23/2021 10:02 AM Scope Withdrawal Time: 0 hours 6 minutes 1 second  Total Procedure Duration: 0 hours 7 minutes 55 seconds  Estimated Blood Loss:  Estimated blood loss: none.      [facility]

## 2021-11-23 NOTE — H&P (Signed)
Jonathon Bellows, MD [address], [place], [place], [place], [address] [address], [place], [place], [place], [address] Phone: [phone]  Fax: [phone]  Primary Care Physician:  [place]   Pre-Procedure History & Physical: HPI:  Fernando Vega is a 51 y.o. male is here for an colonoscopy.   Past Medical History:  Diagnosis Date   Diabetes mellitus without complication ([place])    Hypertension    Neuropathy    Sarcoidosis    Sleep apnea     Past Surgical History:  Procedure Laterality Date   AMPUTATION Right 12/13/2020   Procedure: AMPUTATION midFOOT-RIGHT;  Surgeon: Criselda Peaches, DPM;  Location: [facility] ORS;  Service: Podiatry;  Laterality: Right;   AMPUTATION Right 11/05/2020   Procedure: AMPUTATION RAY-Partial Ray Amp 2nd & 3rd Digit;  Surgeon: Edrick Kins, DPM;  Location: [facility] ORS;  Service: Podiatry;  Laterality: Right;   APPLICATION OF WOUND VAC Right 11/09/2020   Procedure: APPLICATION OF WOUND VAC;  Surgeon: Edrick Kins, DPM;  Location: [facility] ORS;  Service: Podiatry;  Laterality: Right;   BONE BIOPSY Right 01/08/2021   Procedure: BONE BIOPSY;  Surgeon: Trula Slade, DPM;  Location: [facility] ORS;  Service: Podiatry;  Laterality: Right;   COLONOSCOPY WITH PROPOFOL N/A 07/19/2021   Procedure: COLONOSCOPY WITH PROPOFOL;  Surgeon: Jonathon Bellows, MD;  Location: [facility] ENDOSCOPY;  Service: Gastroenterology;  Laterality: N/A;   INCISION AND DRAINAGE Right 11/01/2020   Procedure: INCISION AND DRAINAGE-Right Foot;  Surgeon: Criselda Peaches, DPM;  Location: [facility] ORS;  Service: Podiatry;  Laterality: Right;   IRRIGATION AND DEBRIDEMENT FOOT Right 11/09/2020   Procedure: IRRIGATION AND DEBRIDEMENT FOOT;  Surgeon: Edrick Kins, DPM;  Location: [facility] ORS;  Service: Podiatry;  Laterality: Right;   IRRIGATION AND DEBRIDEMENT FOOT Right 12/13/2020   Procedure: IRRIGATION AND DEBRIDEMENT FOOT;  Surgeon: Criselda Peaches, DPM;  Location: [facility] ORS;  Service: Podiatry;   Laterality: Right;   TRANSMETATARSAL AMPUTATION Right 01/08/2021   Procedure: LISFRANC  AMPUTATION;  Surgeon: Trula Slade, DPM;  Location: [facility] ORS;  Service: Podiatry;  Laterality: Right;   WOUND DEBRIDEMENT Right 12/16/2020   Procedure: DEBRIDEMENT WOUND;  Surgeon: Felipa Furnace, DPM;  Location: [facility] ORS;  Service: Podiatry;  Laterality: Right;  Transmet amputation washout & closure   WOUND DEBRIDEMENT Right 01/08/2021   Procedure: DEBRIDEMENT WOUND;  Surgeon: Trula Slade, DPM;  Location: [facility] ORS;  Service: Podiatry;  Laterality: Right;    Prior to Admission medications   Medication Sig Start Date End Date Taking? Authorizing Provider  albuterol (VENTOLIN HFA) 108 (90 Base) MCG/ACT inhaler Inhale 2 puffs into the lungs once every 6 (six) hours as needed for wheezing or shortness of breath. 01/27/21  Yes Iloabachie, Chioma E, NP  amLODipine (NORVASC) 10 MG tablet Take 1 tablet (10 mg total) by mouth once daily. 12/30/20  Yes Iloabachie, Chioma E, NP  atorvastatin (LIPITOR) 20 MG tablet Take 20 mg by mouth daily. 05/27/21  Yes [provider]  cephALEXin (KEFLEX) 500 MG capsule Take 1 capsule (500 mg total) by mouth 4 (four) times daily for 10 days. 11/20/21 11/30/21 Yes Poggi, Clarnce Flock, PA-C  doxycycline (ADOXA) 100 MG tablet Take 1 tablet (100 mg total) by mouth 2 (two) times daily for 10 days. 11/20/21 11/30/21 Yes Poggi, Clarnce Flock, PA-C  Insulin Glargine (BASAGLAR KWIKPEN) 100 UNIT/ML Inject 15 Units into the skin at bedtime. 12/30/20  Yes Iloabachie, Chioma E, NP  insulin lispro (HUMALOG KWIKPEN)  100 UNIT/ML KwikPen Inject 7 Units into the skin 3 (three) times daily before meals. 01/27/21  Yes Iloabachie, Chioma E, NP  Multiple Vitamin (MULTIVITAMIN WITH MINERALS) TABS tablet Take 1 tablet by mouth once daily. 11/13/20  Yes Wieting, Richard, MD  pantoprazole (PROTONIX) 40 MG tablet Take 1 tablet (40 mg total) by mouth once daily. 12/30/20  Yes Iloabachie, Chioma E, NP   acetaminophen (TYLENOL) 500 MG tablet Take 1 tablet (500 mg total) by mouth every 6 (six) hours as needed for mild pain (or Fever >/= 101). 12/19/20   Cherene Altes, MD  Ascorbic Acid (VITAMIN C WITH ROSE HIPS) 500 MG tablet Take 500 mg by mouth daily.    [provider]  benzonatate (TESSALON PERLES) 100 MG capsule 1-2 every 8 hours if needed cough Patient not taking: Reported on 11/23/2021 08/15/21   [name], PA-C  cetirizine (ZYRTEC ALLERGY) 10 MG tablet Take 1 tablet (10 mg total) by mouth daily. For allergy systems 08/15/21 08/15/22  [name], PA-C  gabapentin (NEURONTIN) 300 MG capsule Take 1 capsule (300 mg total) by mouth once daily at bedtime. 12/30/20   Iloabachie, Chioma E, NP  hydrOXYzine (ATARAX) 25 MG tablet Take 1 tablet (25 mg total) by mouth every 6 (six) hours as needed for itching. 09/04/21   [name], PA-C  Insulin Pen Needle 32G X 6 MM MISC USE AS DIRECTED WITH INSULIN PENS. 12/30/20   Iloabachie, Chioma E, NP  sulfamethoxazole-trimethoprim (BACTRIM DS) 800-160 MG tablet Take 1 tablet by mouth 2 (two) times daily. Patient not taking: Reported on 11/23/2021 09/12/21   Sherrie George B, FNP  triamcinolone cream (KENALOG) 0.1 % Apply 1 Application topically 2 (two) times daily. 09/04/21   [name], PA-C    Allergies as of 10/20/2021   (No Known Allergies)    Family History  Problem Relation Age of Onset   Hypertension Mother    Diabetes type II Father    Parkinson's disease Father    Heart disease Father     Social History   Socioeconomic History   Marital status: Married    Spouse name: Not on file   Number of children: Not on file   Years of education: Not on file   Highest education level: Not on file  Occupational History   Not on file  Tobacco Use   Smoking status: Never   Smokeless tobacco: Never  Vaping Use   Vaping Use: Never used  Substance and Sexual Activity   Alcohol use: No   Drug use: No   Sexual  activity: Not on file  Other Topics Concern   Not on file  Social History Narrative   Not on file   Social Determinants of Health   Financial Resource Strain: Not on file  Food Insecurity: No Food Insecurity (01/27/2021)   Hunger Vital Sign    Worried About Running Out of Food in the Last Year: Never true    Ran Out of Food in the Last Year: Never true  Transportation Needs: No Transportation Needs (01/27/2021)   PRAPARE - Hydrologist (Medical): No    Lack of Transportation (Non-Medical): No  Physical Activity: Not on file  Stress: Not on file  Social Connections: Not on file  Intimate Partner Violence: Not on file    Review of Systems: See HPI, otherwise negative ROS  Physical Exam: There were no vitals taken for this visit. General:   Alert,  pleasant and cooperative in NAD Head:  Normocephalic and atraumatic. Neck:  Supple; no masses or thyromegaly. Lungs:  Clear throughout to auscultation, normal respiratory effort.    Heart:  +S1, +S2, Regular rate and rhythm, No edema. Abdomen:  Soft, nontender and nondistended. Normal bowel sounds, without guarding, and without rebound.   Neurologic:  Alert and  oriented x4;  grossly normal neurologically.  Impression/Plan: [name] is here for an colonoscopy to be performed for surveillance due to prior history of colon polyps (piecemeal resection in 07/2021)  Risks, benefits, limitations, and alternatives regarding  colonoscopy have been reviewed with the patient.  Questions have been answered.  All parties agreeable.   Jonathon Bellows, MD  11/23/2021, 9:54 AM

## 2021-11-23 NOTE — Anesthesia Preprocedure Evaluation (Addendum)
Anesthesia Evaluation  Patient identified by MRN, date of birth, ID band Patient awake    Reviewed: Allergy & Precautions, NPO status , Patient's Chart, lab work & pertinent test results  History of Anesthesia Complications Negative for: history of anesthetic complications  Airway Mallampati: I  TM Distance: >3 FB Neck ROM: Full    Dental  (+) Edentulous Upper, Edentulous Lower   Pulmonary asthma , sleep apnea (not on CPAP ) ,    Pulmonary exam normal breath sounds clear to auscultation       Cardiovascular Exercise Tolerance: Poor hypertension, negative cardio ROS Normal cardiovascular exam Rhythm:Regular Rate:Normal     Neuro/Psych Sarcoidosis, peripheral neuropathy    Neuromuscular disease negative psych ROS   GI/Hepatic negative GI ROS, Neg liver ROS,   Endo/Other  negative endocrine ROSdiabetes  Renal/GU negative Renal ROS  negative genitourinary   Musculoskeletal negative musculoskeletal ROS (+) Previous amputation     Abdominal   Peds negative pediatric ROS (+)  Hematology negative hematology ROS (+)   Anesthesia Other Findings Patient not taking multiple medications as prescribed due to lack of access   Reproductive/Obstetrics negative OB ROS                            Anesthesia Physical Anesthesia Plan  ASA: 3  Anesthesia Plan: General   Post-op Pain Management: Minimal or no pain anticipated   Induction: Intravenous  PONV Risk Score and Plan: 1 and Propofol infusion  Airway Management Planned: Natural Airway and Nasal Cannula  Additional Equipment:   Intra-op Plan:   Post-operative Plan:   Informed Consent: I have reviewed the patients History and Physical, chart, labs and discussed the procedure including the risks, benefits and alternatives for the proposed anesthesia with the patient or authorized representative who has indicated his/her understanding and  acceptance.     Dental Advisory Given  Plan Discussed with: Anesthesiologist, CRNA and Surgeon  Anesthesia Plan Comments: (Patient consented for risks of anesthesia including but not limited to:  - adverse reactions to medications - risk of airway placement if required - damage to eyes, teeth, lips or other oral mucosa - nerve damage due to positioning  - sore throat or hoarseness - Damage to heart, brain, nerves, lungs, other parts of body or loss of life  Patient voiced understanding.)        Anesthesia Quick Evaluation

## 2021-11-23 NOTE — Transfer of Care (Signed)
Immediate Anesthesia Transfer of Care Note  Patient: Fernando Vega  Procedure(s) Performed: COLONOSCOPY WITH PROPOFOL  Patient Location: Endoscopy Unit  Anesthesia Type:General  Level of Consciousness: drowsy and patient cooperative  Airway & Oxygen Therapy: Patient Spontanous Breathing and Patient connected to face mask oxygen  Post-op Assessment: Report given to RN and Post -op Vital signs reviewed and stable  Post vital signs: Reviewed and stable  Last Vitals:  Vitals Value Taken Time  BP 92/62 11/23/21 1023  Temp 35.8 C 11/23/21 1023  Pulse 74 11/23/21 1025  Resp 12 11/23/21 1025  SpO2 100 % 11/23/21 1025  Vitals shown include unvalidated device data.  Last Pain:  Vitals:   11/23/21 1023  TempSrc: Temporal  PainSc: Asleep         Complications: No notable events documented.

## 2021-11-24 ENCOUNTER — Encounter: Payer: Self-pay | Admitting: Gastroenterology

## 2021-11-24 LAB — SURGICAL PATHOLOGY

## 2021-11-25 ENCOUNTER — Ambulatory Visit (INDEPENDENT_AMBULATORY_CARE_PROVIDER_SITE_OTHER): Payer: Medicaid Other | Admitting: Urology

## 2021-11-25 ENCOUNTER — Encounter: Payer: Self-pay | Admitting: Urology

## 2021-11-25 VITALS — BP 109/68 | HR 97 | Ht 67.0 in | Wt 200.0 lb

## 2021-11-25 DIAGNOSIS — R3912 Poor urinary stream: Secondary | ICD-10-CM

## 2021-11-25 DIAGNOSIS — R350 Frequency of micturition: Secondary | ICD-10-CM | POA: Diagnosis not present

## 2021-11-25 DIAGNOSIS — R3911 Hesitancy of micturition: Secondary | ICD-10-CM | POA: Diagnosis not present

## 2021-11-25 DIAGNOSIS — R399 Unspecified symptoms and signs involving the genitourinary system: Secondary | ICD-10-CM

## 2021-11-25 LAB — MICROSCOPIC EXAMINATION: Bacteria, UA: NONE SEEN

## 2021-11-25 LAB — BLADDER SCAN AMB NON-IMAGING: Scan Result: 11

## 2021-11-25 LAB — URINALYSIS, COMPLETE
Bilirubin, UA: NEGATIVE
Ketones, UA: NEGATIVE
Leukocytes,UA: NEGATIVE
Nitrite, UA: NEGATIVE
Protein,UA: NEGATIVE
Specific Gravity, UA: 1.01 (ref 1.005–1.030)
Urobilinogen, Ur: 0.2 mg/dL (ref 0.2–1.0)
pH, UA: 5 (ref 5.0–7.5)

## 2021-11-25 NOTE — Patient Instructions (Signed)

## 2021-11-25 NOTE — Progress Notes (Signed)
11/25/2021 8:38 AM   Fernando Vega [birth year] [id number]  Referring provider: Howard Pouch, NP [address] [place],  [place] [address]  Chief Complaint  Patient presents with   Urinary Retention    HPI: Fernando Vega is a 51 y.o. male referred for evaluation of lower urinary tract symptoms.  Seen [facility] July 2023 for bothersome lower urinary tract symptoms.  Given a trial of tamsulosin which did not improve his symptoms.  Voiding symptoms were not described in forwarded notes PSA 09/20/2021 was 0.5 States symptoms started after a colonoscopy in early May 2023 Current voiding complaints include urinary frequency, intermittent urinary stream, weak urinary stream and urinary hesitancy. IPSS 18/35 No dysuria, gross hematuria Denies flank, abdominal or pelvic pain  PMH: Past Medical History:  Diagnosis Date   Diabetes mellitus without complication ([place])    Hypertension    Neuropathy    Sarcoidosis    Sleep apnea     Surgical History: Past Surgical History:  Procedure Laterality Date   AMPUTATION Right 12/13/2020   Procedure: AMPUTATION midFOOT-RIGHT;  Surgeon: Criselda Peaches, DPM;  Location: [facility] ORS;  Service: Podiatry;  Laterality: Right;   AMPUTATION Right 11/05/2020   Procedure: AMPUTATION RAY-Partial Ray Amp 2nd & 3rd Digit;  Surgeon: Edrick Kins, DPM;  Location: [facility];  Service: Podiatry;  Laterality: Right;   APPLICATION OF WOUND VAC Right 11/09/2020   Procedure: APPLICATION OF WOUND VAC;  Surgeon: Edrick Kins, DPM;  Location: [facility];  Service: Podiatry;  Laterality: Right;   BONE BIOPSY Right 01/08/2021   Procedure: BONE BIOPSY;  Surgeon: Trula Slade, DPM;  Location: [facility];  Service: Podiatry;  Laterality: Right;   COLONOSCOPY WITH PROPOFOL N/A 07/19/2021   Procedure: COLONOSCOPY WITH PROPOFOL;  Surgeon: Jonathon Bellows, MD;  Location: [facility] ENDOSCOPY;  Service: Gastroenterology;  Laterality: N/A;   COLONOSCOPY WITH  PROPOFOL N/A 11/23/2021   Procedure: COLONOSCOPY WITH PROPOFOL;  Surgeon: Jonathon Bellows, MD;  Location: [facility] ENDOSCOPY;  Service: Gastroenterology;  Laterality: N/A;   INCISION AND DRAINAGE Right 11/01/2020   Procedure: INCISION AND DRAINAGE-Right Foot;  Surgeon: Criselda Peaches, DPM;  Location: [facility];  Service: Podiatry;  Laterality: Right;   IRRIGATION AND DEBRIDEMENT FOOT Right 11/09/2020   Procedure: IRRIGATION AND DEBRIDEMENT FOOT;  Surgeon: Edrick Kins, DPM;  Location: [facility];  Service: Podiatry;  Laterality: Right;   IRRIGATION AND DEBRIDEMENT FOOT Right 12/13/2020   Procedure: IRRIGATION AND DEBRIDEMENT FOOT;  Surgeon: Criselda Peaches, DPM;  Location: [facility];  Service: Podiatry;  Laterality: Right;   TRANSMETATARSAL AMPUTATION Right 01/08/2021   Procedure: LISFRANC  AMPUTATION;  Surgeon: Trula Slade, DPM;  Location: [facility];  Service: Podiatry;  Laterality: Right;   WOUND DEBRIDEMENT Right 12/16/2020   Procedure: DEBRIDEMENT WOUND;  Surgeon: Felipa Furnace, DPM;  Location: [facility];  Service: Podiatry;  Laterality: Right;  Transmet amputation washout & closure   WOUND DEBRIDEMENT Right 01/08/2021   Procedure: DEBRIDEMENT WOUND;  Surgeon: Trula Slade, DPM;  Location: [facility];  Service: Podiatry;  Laterality: Right;    Home Medications:  Allergies as of 11/25/2021   No Known Allergies      Medication List        Accurate as of November 25, 2021  8:38 AM. If you have any questions, ask your nurse or doctor.          acetaminophen 500 MG tablet Commonly known as: TYLENOL Take 1 tablet (500 mg total)  by mouth every 6 (six) hours as needed for mild pain (or Fever >/= 101).   albuterol 108 (90 Base) MCG/ACT inhaler Commonly known as: VENTOLIN HFA Inhale 2 puffs into the lungs once every 6 (six) hours as needed for wheezing or shortness of breath.   amLODipine 10 MG tablet Commonly known as: NORVASC Take 1 tablet (10 mg total) by mouth once daily.    atorvastatin 20 MG tablet Commonly known as: LIPITOR Take 20 mg by mouth daily.   Basaglar KwikPen 100 UNIT/ML Inject 15 Units into the skin at bedtime.   benzonatate 100 MG capsule Commonly known as: Tessalon Perles 1-2 every 8 hours if needed cough   cephALEXin 500 MG capsule Commonly known as: KEFLEX Take 1 capsule (500 mg total) by mouth 4 (four) times daily for 10 days.   cetirizine 10 MG tablet Commonly known as: ZyrTEC Allergy Take 1 tablet (10 mg total) by mouth daily. For allergy systems   doxycycline 100 MG tablet Commonly known as: ADOXA Take 1 tablet (100 mg total) by mouth 2 (two) times daily for 10 days.   gabapentin 300 MG capsule Commonly known as: NEURONTIN Take 1 capsule (300 mg total) by mouth once daily at bedtime.   hydrOXYzine 25 MG tablet Commonly known as: ATARAX Take 1 tablet (25 mg total) by mouth every 6 (six) hours as needed for itching.   insulin lispro 100 UNIT/ML KwikPen Commonly known as: HumaLOG KwikPen Inject 7 Units into the skin 3 (three) times daily before meals.   Insulin Pen Needle 32G X 6 MM Misc USE AS DIRECTED WITH INSULIN PENS.   multivitamin with minerals Tabs tablet Take 1 tablet by mouth once daily.   pantoprazole 40 MG tablet Commonly known as: PROTONIX Take 1 tablet (40 mg total) by mouth once daily.   sulfamethoxazole-trimethoprim 800-160 MG tablet Commonly known as: BACTRIM DS Take 1 tablet by mouth 2 (two) times daily.   triamcinolone cream 0.1 % Commonly known as: KENALOG Apply 1 Application topically 2 (two) times daily.   vitamin C with rose hips 500 MG tablet Take 500 mg by mouth daily.        Allergies: No Known Allergies  Family History: Family History  Problem Relation Age of Onset   Hypertension Mother    Diabetes type II Father    Parkinson's disease Father    Heart disease Father     Social History:  reports that he has never smoked. He has never used smokeless tobacco. He reports  that he does not drink alcohol and does not use drugs.   Physical Exam: BP 109/68   Pulse 97   Ht '5\' 7"'$  (1.702 m)   Wt 200 lb (90.7 kg)   BMI 31.32 kg/m   Constitutional:  Alert and oriented, No acute distress. HEENT: [place] AT Respiratory: Normal respiratory effort, no increased work of breathing. GU: Prostate 40 g, smooth without nodules Skin: No rashes, bruises or suspicious lesions. Psychiatric: Normal mood and affect.   Assessment & Plan:    1.  Lower urinary tract symptoms Most bothersome symptoms are obstructive and not improved with tamsulosin We discussed possibility of urethral stricture Schedule cystoscopy for further evaluation of his outlet.   Abbie Sons, [place] [address] [place], [place] [address] [phone]

## 2021-12-09 ENCOUNTER — Other Ambulatory Visit: Payer: Medicaid Other | Admitting: Urology

## 2021-12-19 ENCOUNTER — Other Ambulatory Visit: Payer: Self-pay

## 2021-12-19 ENCOUNTER — Ambulatory Visit (INDEPENDENT_AMBULATORY_CARE_PROVIDER_SITE_OTHER): Payer: Medicaid Other | Admitting: Urology

## 2021-12-19 ENCOUNTER — Encounter: Payer: Self-pay | Admitting: Urology

## 2021-12-19 ENCOUNTER — Telehealth: Payer: Self-pay | Admitting: Urology

## 2021-12-19 VITALS — BP 153/93 | HR 89 | Ht 67.0 in | Wt 205.0 lb

## 2021-12-19 DIAGNOSIS — R399 Unspecified symptoms and signs involving the genitourinary system: Secondary | ICD-10-CM | POA: Diagnosis not present

## 2021-12-19 MED ORDER — SILODOSIN 8 MG PO CAPS
8.0000 mg | ORAL_CAPSULE | Freq: Every day | ORAL | 1 refills | Status: DC
  Filled 2021-12-19: qty 30, 30d supply, fill #0

## 2021-12-19 MED ORDER — SILODOSIN 8 MG PO CAPS: 8.0000 mg | ORAL_CAPSULE | Freq: Every day | ORAL | 1 refills | Status: DC

## 2021-12-19 NOTE — Progress Notes (Signed)
   12/19/21  CC:  Chief Complaint  Patient presents with   Cysto    HPI: Refer to previous office note 11/25/2021.  No significant change in his voiding pattern.  Blood pressure (!) 153/93, pulse 89, height '5\' 7"'$  (1.702 m), weight 205 lb (93 kg).   Cystoscopy Procedure Note  Patient identification was confirmed, informed consent was obtained, and patient was prepped using Betadine solution.  Lidocaine jelly was administered per urethral meatus.     Pre-Procedure: - Inspection reveals a normal caliber urethral meatus.  Procedure: The flexible cystoscope was introduced without difficulty - No urethral strictures/lesions are present. -  Mild lateral lobe enlargement  prostate  -Mild elevation bladder neck - Bilateral ureteral orifices identified - Bladder mucosa  reveals no ulcers, tumors, or lesions - No bladder stones - No trabeculation  Retroflexion shows no abnormalities or intravesical median lobe   Post-Procedure: - Patient tolerated the procedure well  Assessment/ Plan: Mild prostatic enlargement Trial silodosin to see if any better efficacy than tamsulosin PA follow-up 1 month symptom recheck UDS if no improvement    Abbie Sons, MD

## 2021-12-19 NOTE — Telephone Encounter (Signed)
Patient went to the [facility] to pick up his prescription for Rapaflow.  Pharmacy informed him that the medication requires prior authorization.

## 2021-12-20 LAB — URINALYSIS, COMPLETE
Bilirubin, UA: NEGATIVE
Ketones, UA: NEGATIVE
Leukocytes,UA: NEGATIVE
Nitrite, UA: NEGATIVE
Protein,UA: NEGATIVE
Specific Gravity, UA: 1.02 (ref 1.005–1.030)
Urobilinogen, Ur: 0.2 mg/dL (ref 0.2–1.0)
pH, UA: 5 (ref 5.0–7.5)

## 2021-12-20 LAB — MICROSCOPIC EXAMINATION
Bacteria, UA: NONE SEEN
Epithelial Cells (non renal): NONE SEEN /hpf (ref 0–10)

## 2021-12-26 NOTE — Telephone Encounter (Signed)
He has already tried tamsulosin- send in rx alfusosin '10mg'$  #30, no refills

## 2021-12-26 NOTE — Telephone Encounter (Signed)
Rapaflow was denised. Needs to try TAMSULOSIN CAP 0.'4MG'$  or ALFUZOSIN TAB '10MG'$  ER

## 2021-12-27 ENCOUNTER — Other Ambulatory Visit: Payer: Self-pay | Admitting: *Deleted

## 2021-12-27 MED ORDER — ALFUZOSIN HCL ER 10 MG PO TB24: 10.0000 mg | ORAL_TABLET | Freq: Every day | ORAL | 0 refills | Status: DC

## 2021-12-27 NOTE — Telephone Encounter (Signed)
Notified patient as instructed, patient pleased °

## 2021-12-29 ENCOUNTER — Emergency Department
Admission: EM | Admit: 2021-12-29 | Discharge: 2021-12-29 | Disposition: A | Discharge status: Home / Self Care | Payer: Medicaid Other | Attending: Student in an Organized Health Care Education/Training Program | Admitting: Student in an Organized Health Care Education/Training Program

## 2021-12-29 ENCOUNTER — Encounter: Payer: Self-pay | Admitting: Emergency Medicine

## 2021-12-29 ENCOUNTER — Other Ambulatory Visit: Payer: Self-pay

## 2021-12-29 DIAGNOSIS — I10 Essential (primary) hypertension: Secondary | ICD-10-CM | POA: Diagnosis not present

## 2021-12-29 DIAGNOSIS — L738 Other specified follicular disorders: Secondary | ICD-10-CM | POA: Insufficient documentation

## 2021-12-29 DIAGNOSIS — E119 Type 2 diabetes mellitus without complications: Secondary | ICD-10-CM | POA: Insufficient documentation

## 2021-12-29 DIAGNOSIS — L739 Follicular disorder, unspecified: Secondary | ICD-10-CM

## 2021-12-29 MED ORDER — CLINDAMYCIN HCL 300 MG PO CAPS: 300.0000 mg | ORAL_CAPSULE | Freq: Three times a day (TID) | ORAL | 0 refills | Status: AC

## 2021-12-29 NOTE — Discharge Instructions (Signed)
Follow-up with your primary care provider if any continued problems or concerns.  You may take Tylenol or ibuprofen if needed for pain.  Begin taking the antibiotic until completely finished.  You may want to take a probiotic while taking this medication to protect your stomach.  Use warm moist compresses to your face frequently and discontinue shaving at this time which is making your infection worse.  Also when the infection has cleared and you do begin using a razor use a new razor as the one you are using now may have bacteria on it.

## 2021-12-29 NOTE — ED Provider Notes (Signed)
[facility] Provider Note    Event Date/Time   First MD Initiated Contact with Patient 12/29/21 [phone]     (approximate)   History   Abscess   HPI  Fernando Vega is a 51 y.o. male   presents to the ED with complaint of continued breakouts on his face.  He was seen in the ED on 11/20/2021 at which time he was covered with 2 antibiotics that he took completely.  He now has 2 new areas that have become red and tender.  Patient denies any fever or chills.  Patient has history of diabetes, hypertension, neuropathy, soft tissue infections, cellulitis.      Physical Exam   Triage Vital Signs: ED Triage Vitals  Enc Vitals Group     BP 12/29/21 0801 133/76     Pulse Rate 12/29/21 0801 94     Resp 12/29/21 0801 17     Temp 12/29/21 0807 97.7 F (36.5 C)     Temp src --      SpO2 12/29/21 0807 97 %     Weight 12/29/21 0750 204 lb 12.9 oz (92.9 kg)     Height 12/29/21 0750 '5\' 7"'$  (1.702 m)     Head Circumference --      Peak Flow --      Pain Score 12/29/21 0750 0     Pain Loc --      Pain Edu? --      Excl. in [place]? --     Most recent vital signs: Vitals:   12/29/21 0801 12/29/21 0807  BP: 133/76   Pulse: 94   Resp: 17   Temp:  97.7 F (36.5 C)  SpO2:  97%     General: Awake, no distress.  CV:  Good peripheral perfusion.  Resp:  Normal effort.  Abd:  No distention.  Other:  There are 2 older erythematous nodules noted anterior neck with minimal tenderness and no active drainage.  There 3 new smaller areas that are papules within the beard area without active drainage.  No surrounding cellulitis is present.   ED Results / Procedures / Treatments   Labs (all labs ordered are listed, but only abnormal results are displayed) Labs Reviewed - No data to display     PROCEDURES:  Critical Care performed:   Procedures   MEDICATIONS ORDERED IN ED: Medications - No data to display   IMPRESSION / MDM / [place] / ED COURSE  I  reviewed the triage vital signs and the nursing notes.   Differential diagnosis includes, but is not limited to, folliculitis barbae, infected ingrown hair, skin abscess.  51 year old male presents to the ED with complaint of red papular areas to his face.  Patient states that it is difficult for him to shave with these areas.  He was recently seen in the emergency department for similar symptoms and was treated with cephalexin and doxycycline.  Patient states that area is improved but did not completely go away.  Patient is afebrile in the ED today.  No fluctuant area that is drainable.  Patient was made aware that this antibiotic would be different and to continue taking it until completely finished.  A prescription for clindamycin was sent to the pharmacy and he is encouraged to take a probiotic while taking this.  Warm moist compresses to the areas 3-4 times a day and also discard his razor as it may contain bacteria with it as well.  He is  to follow-up with his PCP if any continued problems or concerns.      Patient's presentation is most consistent with acute, uncomplicated illness.  FINAL CLINICAL IMPRESSION(S) / ED DIAGNOSES   Final diagnoses:  Hair follicle infection     Rx / DC Orders   ED Discharge Orders          Ordered    clindamycin (CLEOCIN) 300 MG capsule  3 times daily        12/29/21 0851             Note:  This document was prepared using Dragon voice recognition software and may include unintentional dictation errors.   [name], PA-C 12/29/21 1030    Merlyn Lot, MD 12/29/21 1313

## 2021-12-29 NOTE — ED Triage Notes (Signed)
Two raised areas to neck and jaw x 1 week.  States areas itch.  AAOx3.  Skin warm and dry. NAD

## 2022-01-20 ENCOUNTER — Ambulatory Visit (INDEPENDENT_AMBULATORY_CARE_PROVIDER_SITE_OTHER): Payer: Medicaid Other | Admitting: Physician Assistant

## 2022-01-20 ENCOUNTER — Encounter: Payer: Self-pay | Admitting: Physician Assistant

## 2022-01-20 VITALS — BP 113/74 | HR 94 | Ht 67.0 in | Wt 204.0 lb

## 2022-01-20 DIAGNOSIS — N529 Male erectile dysfunction, unspecified: Secondary | ICD-10-CM

## 2022-01-20 DIAGNOSIS — R3912 Poor urinary stream: Secondary | ICD-10-CM | POA: Diagnosis not present

## 2022-01-20 DIAGNOSIS — N401 Enlarged prostate with lower urinary tract symptoms: Secondary | ICD-10-CM

## 2022-01-20 MED ORDER — TADALAFIL 5 MG PO TABS: 5.0000 mg | ORAL_TABLET | Freq: Every day | ORAL | 0 refills | Status: DC | PRN

## 2022-01-20 MED ORDER — ALFUZOSIN HCL ER 10 MG PO TB24: 10.0000 mg | ORAL_TABLET | Freq: Every day | ORAL | 11 refills | Status: AC

## 2022-01-20 NOTE — Progress Notes (Signed)
01/20/2022 4:45 PM   Fernando Vega December 01, 1970 [id number]  CC: Chief Complaint  Patient presents with   Follow-up   HPI: Fernando Vega is a 51 y.o. male with PMH DM 2 with peripheral neuropathy, hypertension, and BPH with LUTS who presents today for symptom recheck on alfuzosin.   Today he reports symptomatic improvement on alfuzosin.  He describes less straining and a stronger urinary stream.  He denies orthostasis.  He also reports a years long history of erectile dysfunction.  He is unable to obtain any erection at all.  He has not tried medication for this.  He reports that he has gotten his diabetes under better control with his A1c lowered from 11.8-7.5.  He denies a history of heart failure or chest pain and he is not taking nitrates.  PMH: Past Medical History:  Diagnosis Date   Diabetes mellitus without complication ([place])    Hypertension    Neuropathy    Sarcoidosis    Sleep apnea     Surgical History: Past Surgical History:  Procedure Laterality Date   AMPUTATION Right 12/13/2020   Procedure: AMPUTATION midFOOT-RIGHT;  Surgeon: Criselda Peaches, DPM;  Location: [facility];  Service: Podiatry;  Laterality: Right;   AMPUTATION Right 11/05/2020   Procedure: AMPUTATION RAY-Partial Ray Amp 2nd & 3rd Digit;  Surgeon: Edrick Kins, DPM;  Location: [facility];  Service: Podiatry;  Laterality: Right;   APPLICATION OF WOUND VAC Right 11/09/2020   Procedure: APPLICATION OF WOUND VAC;  Surgeon: Edrick Kins, DPM;  Location: [facility];  Service: Podiatry;  Laterality: Right;   BONE BIOPSY Right 01/08/2021   Procedure: BONE BIOPSY;  Surgeon: Trula Slade, DPM;  Location: [facility];  Service: Podiatry;  Laterality: Right;   COLONOSCOPY WITH PROPOFOL N/A 07/19/2021   Procedure: COLONOSCOPY WITH PROPOFOL;  Surgeon: Jonathon Bellows, MD;  Location: [facility] ENDOSCOPY;  Service: Gastroenterology;  Laterality: N/A;   COLONOSCOPY WITH PROPOFOL N/A 11/23/2021   Procedure: COLONOSCOPY WITH  PROPOFOL;  Surgeon: Jonathon Bellows, MD;  Location: [facility] ENDOSCOPY;  Service: Gastroenterology;  Laterality: N/A;   INCISION AND DRAINAGE Right 11/01/2020   Procedure: INCISION AND DRAINAGE-Right Foot;  Surgeon: Criselda Peaches, DPM;  Location: [facility];  Service: Podiatry;  Laterality: Right;   IRRIGATION AND DEBRIDEMENT FOOT Right 11/09/2020   Procedure: IRRIGATION AND DEBRIDEMENT FOOT;  Surgeon: Edrick Kins, DPM;  Location: [facility];  Service: Podiatry;  Laterality: Right;   IRRIGATION AND DEBRIDEMENT FOOT Right 12/13/2020   Procedure: IRRIGATION AND DEBRIDEMENT FOOT;  Surgeon: Criselda Peaches, DPM;  Location: [facility];  Service: Podiatry;  Laterality: Right;   TRANSMETATARSAL AMPUTATION Right 01/08/2021   Procedure: LISFRANC  AMPUTATION;  Surgeon: Trula Slade, DPM;  Location: [facility];  Service: Podiatry;  Laterality: Right;   WOUND DEBRIDEMENT Right 12/16/2020   Procedure: DEBRIDEMENT WOUND;  Surgeon: Felipa Furnace, DPM;  Location: [facility];  Service: Podiatry;  Laterality: Right;  Transmet amputation washout & closure   WOUND DEBRIDEMENT Right 01/08/2021   Procedure: DEBRIDEMENT WOUND;  Surgeon: Trula Slade, DPM;  Location: [facility];  Service: Podiatry;  Laterality: Right;    Home Medications:  Allergies as of 01/20/2022   No Known Allergies      Medication List        Accurate as of January 20, 2022  4:45 PM. If you have any questions, ask your nurse or doctor.          acetaminophen 500 MG tablet Commonly  known as: TYLENOL Take 1 tablet (500 mg total) by mouth every 6 (six) hours as needed for mild pain (or Fever >/= 101).   albuterol 108 (90 Base) MCG/ACT inhaler Commonly known as: VENTOLIN HFA Inhale 2 puffs into the lungs once every 6 (six) hours as needed for wheezing or shortness of breath.   alfuzosin 10 MG 24 hr tablet Commonly known as: UROXATRAL Take 1 tablet (10 mg total) by mouth daily with breakfast.   amLODipine 10 MG tablet Commonly known  as: NORVASC Take 1 tablet (10 mg total) by mouth once daily.   atorvastatin 20 MG tablet Commonly known as: LIPITOR Take 20 mg by mouth daily.   Basaglar KwikPen 100 UNIT/ML Inject 15 Units into the skin at bedtime.   benzonatate 100 MG capsule Commonly known as: Tessalon Perles 1-2 every 8 hours if needed cough   cetirizine 10 MG tablet Commonly known as: ZyrTEC Allergy Take 1 tablet (10 mg total) by mouth daily. For allergy systems   gabapentin 300 MG capsule Commonly known as: NEURONTIN Take 1 capsule (300 mg total) by mouth once daily at bedtime.   hydrOXYzine 25 MG tablet Commonly known as: ATARAX Take 1 tablet (25 mg total) by mouth every 6 (six) hours as needed for itching.   insulin lispro 100 UNIT/ML KwikPen Commonly known as: HumaLOG KwikPen Inject 7 Units into the skin 3 (three) times daily before meals.   Insulin Pen Needle 32G X 6 MM Misc USE AS DIRECTED WITH INSULIN PENS.   losartan 25 MG tablet Commonly known as: COZAAR Take 25 mg by mouth daily.   multivitamin with minerals Tabs tablet Take 1 tablet by mouth once daily.   Ozempic (0.25 or 0.5 MG/DOSE) 2 MG/3ML Sopn Generic drug: Semaglutide(0.25 or 0.'5MG'$ /DOS) Inject into the skin.   pantoprazole 40 MG tablet Commonly known as: PROTONIX Take 1 tablet (40 mg total) by mouth once daily.   tadalafil 5 MG tablet Commonly known as: CIALIS Take 1 tablet (5 mg total) by mouth daily as needed for erectile dysfunction. Started by: Debroah Loop, PA-C   vitamin C with rose hips 500 MG tablet Take 500 mg by mouth daily.        Allergies:  No Known Allergies  Family History: Family History  Problem Relation Age of Onset   Hypertension Mother    Diabetes type II Father    Parkinson's disease Father    Heart disease Father     Social History:   reports that he has never smoked. He has never used smokeless tobacco. He reports that he does not drink alcohol and does not use  drugs.  Physical Exam: BP 113/74   Pulse 94   Ht '5\' 7"'$  (1.702 m)   Wt 204 lb (92.5 kg)   BMI 31.95 kg/m   Constitutional:  Alert and oriented, no acute distress, nontoxic appearing HEENT: [place], AT Cardiovascular: No clubbing, cyanosis, or edema Respiratory: Normal respiratory effort, no increased work of breathing Skin: No rashes, bruises or suspicious lesions Neurologic: Grossly intact, no focal deficits, moving all 4 extremities Psychiatric: Normal mood and affect  Assessment & Plan:   1. Benign prostatic hyperplasia with weak urinary stream Symptomatic improvement on alfuzosin, will plan to continue this.  I think he will also benefit from tadalafil as below. - tadalafil (CIALIS) 5 MG tablet; Take 1 tablet (5 mg total) by mouth daily as needed for erectile dysfunction.  Dispense: 30 tablet; Refill: 0 - alfuzosin (UROXATRAL) 10 MG 24 hr  tablet; Take 1 tablet (10 mg total) by mouth daily with breakfast.  Dispense: 30 tablet; Refill: 11  2. Erectile dysfunction, unspecified erectile dysfunction type We discussed that his hypertension and diabetes are likely contributory to his ED and that even though his diabetes is under better control, he may never have a regain of spontaneous erections.  I offered him pharmacotherapy and he accepted.  I recommend starting with 5 mg daily tadalafil for cotreatment of ED and BPH as above.  We will plan to see him back in clinic in 1 month with a shim, IPSS, and PVR.  If he is not having sufficient erections at that time, we will add demand dose tadalafil on top of daily dosing. - tadalafil (CIALIS) 5 MG tablet; Take 1 tablet (5 mg total) by mouth daily as needed for erectile dysfunction.  Dispense: 30 tablet; Refill: 0  Return in about 4 weeks (around 02/17/2022) for Symptom recheck with IPSS/SHIM/PVR.  Debroah Loop, PA-C  [facility] [address], [place], [place] [address] [phone]

## 2022-02-17 ENCOUNTER — Encounter: Payer: Self-pay | Admitting: Physician Assistant

## 2022-02-17 ENCOUNTER — Ambulatory Visit (INDEPENDENT_AMBULATORY_CARE_PROVIDER_SITE_OTHER): Payer: Medicaid Other | Admitting: Physician Assistant

## 2022-02-17 VITALS — BP 83/48 | HR 88 | Ht 66.0 in | Wt 204.0 lb

## 2022-02-17 DIAGNOSIS — R3912 Poor urinary stream: Secondary | ICD-10-CM

## 2022-02-17 DIAGNOSIS — N529 Male erectile dysfunction, unspecified: Secondary | ICD-10-CM

## 2022-02-17 DIAGNOSIS — N401 Enlarged prostate with lower urinary tract symptoms: Secondary | ICD-10-CM

## 2022-02-17 LAB — BLADDER SCAN AMB NON-IMAGING

## 2022-02-17 MED ORDER — TADALAFIL 20 MG PO TABS: ORAL_TABLET | ORAL | 0 refills | Status: DC

## 2022-02-17 NOTE — Patient Instructions (Addendum)
Continue alfuzosin daily. Continue 38m tadalafil (Cialis) daily. Start '20mg'$  tadalafil (Cialis) 60 minutes prior to sexual activity.

## 2022-02-17 NOTE — Progress Notes (Signed)
02/17/2022 11:01 AM   Fernando Vega Dec 29, 1970 [id number]  CC: Chief Complaint  Patient presents with   Follow-up   Benign Prostatic Hypertrophy   HPI: Fernando Vega is a 51 y.o. male with PMH DM2 with peripheral neuropathy, hypertension, BPH with LUTS on alfuzosin and tadalafil 5 mg daily, and ED who presents today for symptom recheck after starting daily tadalafil.   Today he reports his weak stream has continued to improve on combination alfuzosin and tadalafil over the past month, however his erectile dysfunction remains unchanged.  He has had no spontaneous or stimulated erections since starting daily tadalafil.  IPSS 16/mostly satisfied as below.  SHIM 5.  PVR 15 mL.   IPSS     Row Name 02/17/22 1000         International Prostate Symptom Score   How often have you had the sensation of not emptying your bladder? About half the time     How often have you had to urinate less than every two hours? Less than half the time     How often have you found you stopped and started again several times when you urinated? Less than 1 in 5 times     How often have you found it difficult to postpone urination? Not at All     How often have you had a weak urinary stream? Almost always     How often have you had to strain to start urination? About half the time     How many times did you typically get up at night to urinate? 2 Times     Total IPSS Score 16       Quality of Life due to urinary symptoms   If you were to spend the rest of your life with your urinary condition just the way it is now how would you feel about that? Mostly Satisfied                Allen Name 02/17/22 1059         SHIM: Over the last 6 months:   How do you rate your confidence that you could get and keep an erection? Very Low     When you had erections with sexual stimulation, how often were your erections hard enough for penetration (entering your partner)? Almost Never or Never     During  sexual intercourse, how often were you able to maintain your erection after you had penetrated (entered) your partner? Almost Never or Never     During sexual intercourse, how difficult was it to maintain your erection to completion of intercourse? Extremely Difficult     When you attempted sexual intercourse, how often was it satisfactory for you? Almost Never or Never       SHIM Total Score   SHIM 5              PMH: Past Medical History:  Diagnosis Date   Diabetes mellitus without complication ([place])    Hypertension    Neuropathy    Sarcoidosis    Sleep apnea    Surgical History: Past Surgical History:  Procedure Laterality Date   AMPUTATION Right 12/13/2020   Procedure: AMPUTATION midFOOT-RIGHT;  Surgeon: Criselda Peaches, DPM;  Location: [facility];  Service: Podiatry;  Laterality: Right;   AMPUTATION Right 11/05/2020   Procedure: AMPUTATION RAY-Partial Ray Amp 2nd & 3rd Digit;  Surgeon: Edrick Kins, DPM;  Location: [facility];  Service: Podiatry;  Laterality: Right;   APPLICATION OF WOUND VAC Right 11/09/2020   Procedure: APPLICATION OF WOUND VAC;  Surgeon: Edrick Kins, DPM;  Location: [facility];  Service: Podiatry;  Laterality: Right;   BONE BIOPSY Right 01/08/2021   Procedure: BONE BIOPSY;  Surgeon: Trula Slade, DPM;  Location: [facility];  Service: Podiatry;  Laterality: Right;   COLONOSCOPY WITH PROPOFOL N/A 07/19/2021   Procedure: COLONOSCOPY WITH PROPOFOL;  Surgeon: Jonathon Bellows, MD;  Location: [facility] ENDOSCOPY;  Service: Gastroenterology;  Laterality: N/A;   COLONOSCOPY WITH PROPOFOL N/A 11/23/2021   Procedure: COLONOSCOPY WITH PROPOFOL;  Surgeon: Jonathon Bellows, MD;  Location: [facility] ENDOSCOPY;  Service: Gastroenterology;  Laterality: N/A;   INCISION AND DRAINAGE Right 11/01/2020   Procedure: INCISION AND DRAINAGE-Right Foot;  Surgeon: Criselda Peaches, DPM;  Location: [facility];  Service: Podiatry;  Laterality: Right;   IRRIGATION AND DEBRIDEMENT FOOT Right 11/09/2020    Procedure: IRRIGATION AND DEBRIDEMENT FOOT;  Surgeon: Edrick Kins, DPM;  Location: [facility];  Service: Podiatry;  Laterality: Right;   IRRIGATION AND DEBRIDEMENT FOOT Right 12/13/2020   Procedure: IRRIGATION AND DEBRIDEMENT FOOT;  Surgeon: Criselda Peaches, DPM;  Location: [facility];  Service: Podiatry;  Laterality: Right;   TRANSMETATARSAL AMPUTATION Right 01/08/2021   Procedure: LISFRANC  AMPUTATION;  Surgeon: Trula Slade, DPM;  Location: [facility];  Service: Podiatry;  Laterality: Right;   WOUND DEBRIDEMENT Right 12/16/2020   Procedure: DEBRIDEMENT WOUND;  Surgeon: Felipa Furnace, DPM;  Location: [facility];  Service: Podiatry;  Laterality: Right;  Transmet amputation washout & closure   WOUND DEBRIDEMENT Right 01/08/2021   Procedure: DEBRIDEMENT WOUND;  Surgeon: Trula Slade, DPM;  Location: [facility];  Service: Podiatry;  Laterality: Right;    Home Medications:  Allergies as of 02/17/2022   No Known Allergies      Medication List        Accurate as of February 17, 2022 11:01 AM. If you have any questions, ask your nurse or doctor.          acetaminophen 500 MG tablet Commonly known as: TYLENOL Take 1 tablet (500 mg total) by mouth every 6 (six) hours as needed for mild pain (or Fever >/= 101).   albuterol 108 (90 Base) MCG/ACT inhaler Commonly known as: VENTOLIN HFA Inhale 2 puffs into the lungs once every 6 (six) hours as needed for wheezing or shortness of breath.   alfuzosin 10 MG 24 hr tablet Commonly known as: UROXATRAL Take 1 tablet (10 mg total) by mouth daily with breakfast.   amLODipine 10 MG tablet Commonly known as: NORVASC Take 1 tablet (10 mg total) by mouth once daily.   atorvastatin 20 MG tablet Commonly known as: LIPITOR Take 20 mg by mouth daily.   Basaglar KwikPen 100 UNIT/ML Inject 15 Units into the skin at bedtime.   benzonatate 100 MG capsule Commonly known as: Tessalon Perles 1-2 every 8 hours if needed cough   cetirizine 10 MG  tablet Commonly known as: ZyrTEC Allergy Take 1 tablet (10 mg total) by mouth daily. For allergy systems   gabapentin 300 MG capsule Commonly known as: NEURONTIN Take 1 capsule (300 mg total) by mouth once daily at bedtime.   hydrOXYzine 25 MG tablet Commonly known as: ATARAX Take 1 tablet (25 mg total) by mouth every 6 (six) hours as needed for itching.   insulin lispro 100 UNIT/ML KwikPen Commonly known as: HumaLOG KwikPen Inject 7 Units into the skin 3 (three)  times daily before meals.   Insulin Pen Needle 32G X 6 MM Misc USE AS DIRECTED WITH INSULIN PENS.   losartan 25 MG tablet Commonly known as: COZAAR Take 25 mg by mouth daily.   multivitamin with minerals Tabs tablet Take 1 tablet by mouth once daily.   Ozempic (0.25 or 0.5 MG/DOSE) 2 MG/3ML Sopn Generic drug: Semaglutide(0.25 or 0.'5MG'$ /DOS) Inject into the skin.   pantoprazole 40 MG tablet Commonly known as: PROTONIX Take 1 tablet (40 mg total) by mouth once daily.   tadalafil 5 MG tablet Commonly known as: CIALIS Take 1 tablet (5 mg total) by mouth daily as needed for erectile dysfunction.   vitamin C with rose hips 500 MG tablet Take 500 mg by mouth daily.        Allergies:  No Known Allergies  Family History: Family History  Problem Relation Age of Onset   Hypertension Mother    Diabetes type II Father    Parkinson's disease Father    Heart disease Father     Social History:   reports that he has never smoked. He has never been exposed to tobacco smoke. He has never used smokeless tobacco. He reports that he does not drink alcohol and does not use drugs.  Physical Exam: BP (!) 83/48   Pulse 88   Ht '5\' 6"'$  (1.676 m)   Wt 204 lb (92.5 kg)   BMI 32.93 kg/m   Constitutional:  Alert and oriented, no acute distress, nontoxic appearing HEENT: [place], AT Cardiovascular: No clubbing, cyanosis, or edema Respiratory: Normal respiratory effort, no increased work of breathing Skin: No rashes, bruises or  suspicious lesions Neurologic: Grossly intact, no focal deficits, moving all 4 extremities Psychiatric: Normal mood and affect  Laboratory Data: Results for orders placed or performed in visit on 02/17/22  BLADDER SCAN AMB NON-IMAGING  Result Value Ref Range   Scan Result 61m    Assessment & Plan:   1. Benign prostatic hyperplasia with weak urinary stream He is emptying appropriately and notes improved stream on alfuzosin and daily tadalafil, will plan to continue these. - BLADDER SCAN AMB NON-IMAGING  2. Erectile dysfunction, unspecified erectile dysfunction type No improvement on low-dose daily tadalafil, will add demand dose 20 mg tadalafil to his regimen.  We discussed that if he fails this, his next step will be intracavernosal injections. - tadalafil (CIALIS) 20 MG tablet; Take one tablet at least 60 minutes prior to sexual activity.  Dispense: 30 tablet; Refill: 0   Return in 6 weeks (on 03/31/2022) for ED follow-up with SHIM.  SDebroah Loop PA-C  [facility] [address] [place] [phone]

## 2022-03-09 ENCOUNTER — Telehealth: Payer: Self-pay

## 2022-03-09 ENCOUNTER — Other Ambulatory Visit: Payer: Self-pay

## 2022-03-09 DIAGNOSIS — Z86010 Personal history of colonic polyps: Secondary | ICD-10-CM

## 2022-03-09 MED ORDER — NA SULFATE-K SULFATE-MG SULF 17.5-3.13-1.6 GM/177ML PO SOLN: 2.0000 | Freq: Once | ORAL | 0 refills | Status: AC

## 2022-03-09 NOTE — Telephone Encounter (Signed)
Patients call has been returned to schedule his repeat colonoscopy.  Dr. Vicente Males recommended a 2 day bowel prep for him.  Colonoscopy scheduled for Monday 03/27/22.  Thanks,  [place], [place]

## 2022-03-14 ENCOUNTER — Ambulatory Visit (INDEPENDENT_AMBULATORY_CARE_PROVIDER_SITE_OTHER): Payer: Medicaid Other | Admitting: Podiatry

## 2022-03-14 ENCOUNTER — Ambulatory Visit (INDEPENDENT_AMBULATORY_CARE_PROVIDER_SITE_OTHER): Payer: Medicaid Other

## 2022-03-14 DIAGNOSIS — S93491A Sprain of other ligament of right ankle, initial encounter: Secondary | ICD-10-CM

## 2022-03-14 NOTE — Progress Notes (Signed)
Subjective:  Patient ID: Fernando Vega, male    DOB: 1971-01-21,  MRN: [id number]  Chief Complaint  Patient presents with   Foot Injury    Right ankle pain from injury started 2 weeks ago, X-Rays take today     52 y.o. male presents with the above complaint.  Patient presents with mild sprain to the right ankle with a history of [place] amputation.  Patient states getting better but just wanted to make sure there is nothing broken.  He denies any other acute complaints.  His pain scale is 1 out of 10 very mild in nature he is a diabetic.   Review of Systems: Negative except as noted in the HPI. Denies N/V/F/Ch.  Past Medical History:  Diagnosis Date   Diabetes mellitus without complication (H[facility])    Hypertension    Neuropathy    Sarcoidosis    Sleep apnea     Current Outpatient Medications:    acetaminophen (TYLENOL) 500 MG tablet, Take 1 tablet (500 mg total) by mouth every 6 (six) hours as needed for mild pain (or Fever >/= 101)., Disp: 30 tablet, Rfl: 0   albuterol (VENTOLIN HFA) 108 (90 Base) MCG/ACT inhaler, Inhale 2 puffs into the lungs once every 6 (six) hours as needed for wheezing or shortness of breath., Disp: 6.7 g, Rfl: 0   alfuzosin (UROXATRAL) 10 MG 24 hr tablet, Take 1 tablet (10 mg total) by mouth daily with breakfast., Disp: 30 tablet, Rfl: 11   amLODipine (NORVASC) 10 MG tablet, Take 1 tablet (10 mg total) by mouth once daily., Disp: 30 tablet, Rfl: 2   Ascorbic Acid (VITAMIN C WITH ROSE HIPS) 500 MG tablet, Take 500 mg by mouth daily., Disp: , Rfl:    atorvastatin (LIPITOR) 20 MG tablet, Take 20 mg by mouth daily., Disp: , Rfl:    benzonatate (TESSALON PERLES) 100 MG capsule, 1-2 every 8 hours if needed cough, Disp: 30 capsule, Rfl: 0   cetirizine (ZYRTEC ALLERGY) 10 MG tablet, Take 1 tablet (10 mg total) by mouth daily. For allergy systems, Disp: 14 tablet, Rfl: 0   gabapentin (NEURONTIN) 300 MG capsule, Take 1 capsule (300 mg total) by mouth once daily at bedtime.,  Disp: 30 capsule, Rfl: 2   hydrOXYzine (ATARAX) 25 MG tablet, Take 1 tablet (25 mg total) by mouth every 6 (six) hours as needed for itching., Disp: 30 tablet, Rfl: 0   Insulin Glargine (BASAGLAR KWIKPEN) 100 UNIT/ML, Inject 15 Units into the skin at bedtime., Disp: 15 mL, Rfl: 3   insulin lispro (HUMALOG KWIKPEN) 100 UNIT/ML KwikPen, Inject 7 Units into the skin 3 (three) times daily before meals., Disp: 15 mL, Rfl: 3   Insulin Pen Needle 32G X 6 MM MISC, USE AS DIRECTED WITH INSULIN PENS., Disp: 100 each, Rfl: 1   losartan (COZAAR) 25 MG tablet, Take 25 mg by mouth daily., Disp: , Rfl:    Multiple Vitamin (MULTIVITAMIN WITH MINERALS) TABS tablet, Take 1 tablet by mouth once daily., Disp: 30 tablet, Rfl: 0   OZEMPIC, 0.25 OR 0.5 MG/DOSE, 2 MG/3ML SOPN, Inject into the skin., Disp: , Rfl:    pantoprazole (PROTONIX) 40 MG tablet, Take 1 tablet (40 mg total) by mouth once daily., Disp: 30 tablet, Rfl: 2   tadalafil (CIALIS) 20 MG tablet, Take one tablet at least 60 minutes prior to sexual activity., Disp: 30 tablet, Rfl: 0   tadalafil (CIALIS) 5 MG tablet, Take 1 tablet (5 mg total) by mouth daily as needed  for erectile dysfunction., Disp: 30 tablet, Rfl: 0  Social History   Tobacco Use  Smoking Status Never   Passive exposure: Never  Smokeless Tobacco Never    No Known Allergies Objective:  There were no vitals filed for this visit. There is no height or weight on file to calculate BMI. Constitutional Well developed. Well nourished.  Vascular Dorsalis pedis pulses palpable left side Posterior tibial pulses palpable bilaterally. Capillary refill normal to all remaining digits No cyanosis or clubbing noted. Pedal hair growth normal.  Neurologic Normal speech. Oriented to person, place, and time. Epicritic sensation to light touch grossly present bilaterally.  Dermatologic Nails well groomed and normal in appearance. No open wounds. No skin lesions.  Orthopedic: Right mild ATFL  ligament sprain.  Pain on palpation to the ATFL ligament no pain at the peroneal tendon Achilles tendon posterior tibial tendon.  No open wounds or lesion noted amputation site has healed considerably well   Radiographs: 3 views of skeletally mature the right ankle: No fractures noted.  No bony abnormalities noted some arthritic pain noted. Assessment:   1. Sprain of anterior talofibular ligament of right ankle, initial encounter    Plan:  Patient was evaluated and treated and all questions answered.  Right ATFL ligament sprain -All questions and concerns were discussed with the patient in extensive detail.  Given that this is very mild in nature patient would like to hold off on any current immobilization or injection.  He states that it is getting better and he is managing the pain.  At this time I discussed with the patient if any foot and ankle issues on future he will come back and see me.  No follow-ups on file.

## 2022-03-16 ENCOUNTER — Telehealth: Payer: Self-pay | Admitting: *Deleted

## 2022-03-16 NOTE — Telephone Encounter (Signed)
Voicemail message left for patient to return my call. Need to reschedule since Dr Vicente Males will not be here on 03/27/2022.

## 2022-03-17 NOTE — Telephone Encounter (Signed)
Attempted to contact patient to reschedule his 03/27/22 colonoscopy with Dr. Vicente Males.  Unable to reach him.  Voicemail currently full.  Thanks,  [place], [place]

## 2022-03-17 NOTE — Telephone Encounter (Signed)
Attempted to contact patient to reschedule his 03/27/22 colonoscopy with Dr. Vicente Males.  Unable to lvm for him as his voice mail is currently full.  Thanks,  [place], [place]

## 2022-03-20 NOTE — Telephone Encounter (Signed)
Patient has been informed that there has been a schedule change for 03/27/22 and we will need to reschedule his procedure.  He has asked to call back at a later time to reschedule.    Procedure has been canceled and will await callback to reschedule colonoscopy.  Thanks,  [place], [place]

## 2022-03-20 NOTE — Telephone Encounter (Signed)
Left message with patients wife asking him to call me back in regard to his procedure we have scheduled for him at his soonest convenience.  Thanks,  [place], [place]

## 2022-03-27 ENCOUNTER — Ambulatory Visit: Admit: 2022-03-27 | Payer: Medicaid Other | Admitting: Gastroenterology

## 2022-03-27 SURGERY — COLONOSCOPY WITH PROPOFOL
Anesthesia: General

## 2022-03-31 ENCOUNTER — Ambulatory Visit (INDEPENDENT_AMBULATORY_CARE_PROVIDER_SITE_OTHER): Payer: Medicaid Other | Admitting: Physician Assistant

## 2022-03-31 DIAGNOSIS — N529 Male erectile dysfunction, unspecified: Secondary | ICD-10-CM | POA: Diagnosis not present

## 2022-03-31 DIAGNOSIS — N401 Enlarged prostate with lower urinary tract symptoms: Secondary | ICD-10-CM

## 2022-03-31 DIAGNOSIS — R3912 Poor urinary stream: Secondary | ICD-10-CM

## 2022-03-31 MED ORDER — SILDENAFIL CITRATE 20 MG PO TABS: ORAL_TABLET | ORAL | 0 refills | Status: AC

## 2022-03-31 MED ORDER — TADALAFIL 5 MG PO TABS: 5.0000 mg | ORAL_TABLET | Freq: Every day | ORAL | 11 refills | Status: DC | PRN

## 2022-03-31 NOTE — Progress Notes (Signed)
03/31/2022 12:19 PM   Fernando Vega [birth year] [id number]  CC: Chief Complaint  Patient presents with   Erectile Dysfunction    Follow up   HPI: Fernando Vega is a 52 y.o. male with PMH DM2 with peripheral neuropathy, hypertension, BPH with LUTS on alfuzosin, and ED who presents today for symptom recheck after starting demand dose tadalafil.   Today he reports he was able to start demand dose tadalafil, however he ran out of low-dose daily tadalafil so he is no longer taking that.  He did notice some improved urinary symptoms on low-dose daily tadalafil.  Overall, he has had no improvement in his erectile dysfunction.  He states he may have some increased penile sensation when he takes demand dose tadalafil, but he is still not able to achieve erection.  Additionally, he has cost concerns with therapy.  Shim 5, previously 5.  PMH: Past Medical History:  Diagnosis Date   Diabetes mellitus without complication ([place])    Hypertension    Neuropathy    Sarcoidosis    Sleep apnea     Surgical History: Past Surgical History:  Procedure Laterality Date   AMPUTATION Right 12/13/2020   Procedure: AMPUTATION midFOOT-RIGHT;  Surgeon: Criselda Peaches, DPM;  Location: [facility];  Service: Podiatry;  Laterality: Right;   AMPUTATION Right 11/05/2020   Procedure: AMPUTATION RAY-Partial Ray Amp 2nd & 3rd Digit;  Surgeon: Edrick Kins, DPM;  Location: [facility];  Service: Podiatry;  Laterality: Right;   APPLICATION OF WOUND VAC Right 11/09/2020   Procedure: APPLICATION OF WOUND VAC;  Surgeon: Edrick Kins, DPM;  Location: [facility];  Service: Podiatry;  Laterality: Right;   BONE BIOPSY Right 01/08/2021   Procedure: BONE BIOPSY;  Surgeon: Trula Slade, DPM;  Location: [facility];  Service: Podiatry;  Laterality: Right;   COLONOSCOPY WITH PROPOFOL N/A 07/19/2021   Procedure: COLONOSCOPY WITH PROPOFOL;  Surgeon: Jonathon Bellows, MD;  Location: [facility] ENDOSCOPY;  Service: Gastroenterology;  Laterality:  N/A;   COLONOSCOPY WITH PROPOFOL N/A 11/23/2021   Procedure: COLONOSCOPY WITH PROPOFOL;  Surgeon: Jonathon Bellows, MD;  Location: [facility] END[facility]COPY;  Service: Gastroenterology;  Laterality: N/A;   INCISION AND DRAINAGE Right 11/01/2020   Procedure: INCISION AND DRAINAGE-Right Foot;  Surgeon: Criselda Peaches, DPM;  Location: [facility];  Service: Podiatry;  Laterality: Right;   IRRIGATION AND DEBRIDEMENT FOOT Right 11/09/2020   Procedure: IRRIGATION AND DEBRIDEMENT FOOT;  Surgeon: Edrick Kins, DPM;  Location: [facility];  Service: Podiatry;  Laterality: Right;   IRRIGATION AND DEBRIDEMENT FOOT Right 12/13/2020   Procedure: IRRIGATION AND DEBRIDEMENT FOOT;  Surgeon: Criselda Peaches, DPM;  Location: [facility];  Service: Podiatry;  Laterality: Right;   TRANSMETATARSAL AMPUTATION Right 01/08/2021   Procedure: LISFRANC  AMPUTATION;  Surgeon: Trula Slade, DPM;  Location: [facility];  Service: Podiatry;  Laterality: Right;   WOUND DEBRIDEMENT Right 12/16/2020   Procedure: DEBRIDEMENT WOUND;  Surgeon: Felipa Furnace, DPM;  Location: [facility];  Service: Podiatry;  Laterality: Right;  Transmet amputation washout & closure   WOUND DEBRIDEMENT Right 01/08/2021   Procedure: DEBRIDEMENT WOUND;  Surgeon: Trula Slade, DPM;  Location: [facility];  Service: Podiatry;  Laterality: Right;    Home Medications:  Allergies as of 03/31/2022   No Known Allergies      Medication List        Accurate as of March 31, 2022 12:19 PM. If you have any questions, ask your nurse or doctor.  acetaminophen 500 MG tablet Commonly known as: TYLENOL Take 1 tablet (500 mg total) by mouth every 6 (six) hours as needed for mild pain (or Fever >/= 101).   albuterol 108 (90 Base) MCG/ACT inhaler Commonly known as: VENTOLIN HFA Inhale 2 puffs into the lungs once every 6 (six) hours as needed for wheezing or shortness of breath.   alfuzosin 10 MG 24 hr tablet Commonly known as: UROXATRAL Take 1 tablet (10 mg  total) by mouth daily with breakfast.   amLODipine 10 MG tablet Commonly known as: NORVASC Take 1 tablet (10 mg total) by mouth once daily.   atorvastatin 20 MG tablet Commonly known as: LIPITOR Take 20 mg by mouth daily.   Basaglar KwikPen 100 UNIT/ML Inject 15 Units into the skin at bedtime.   benzonatate 100 MG capsule Commonly known as: Tessalon Perles 1-2 every 8 hours if needed cough   cetirizine 10 MG tablet Commonly known as: ZyrTEC Allergy Take 1 tablet (10 mg total) by mouth daily. For allergy systems   gabapentin 300 MG capsule Commonly known as: NEURONTIN Take 1 capsule (300 mg total) by mouth once daily at bedtime.   hydrOXYzine 25 MG tablet Commonly known as: ATARAX Take 1 tablet (25 mg total) by mouth every 6 (six) hours as needed for itching.   insulin lispro 100 UNIT/ML KwikPen Commonly known as: HumaLOG KwikPen Inject 7 Units into the skin 3 (three) times daily before meals.   Insulin Pen Needle 32G X 6 MM Misc USE AS DIRECTED WITH INSULIN PENS.   losartan 25 MG tablet Commonly known as: COZAAR Take 25 mg by mouth daily.   multivitamin with minerals Tabs tablet Take 1 tablet by mouth once daily.   Ozempic (0.25 or 0.5 MG/DOSE) 2 MG/3ML Sopn Generic drug: Semaglutide(0.25 or 0.'5MG'$ /DOS) Inject into the skin.   pantoprazole 40 MG tablet Commonly known as: PROTONIX Take 1 tablet (40 mg total) by mouth once daily.   sildenafil 20 MG tablet Commonly known as: REVATIO Take 3-5 tablets about one hour prior to intercourse. Do not exceed 5 tablets per dose.   tadalafil 5 MG tablet Commonly known as: CIALIS Take 1 tablet (5 mg total) by mouth daily as needed for erectile dysfunction. What changed: Another medication with the same name was removed. Continue taking this medication, and follow the directions you see here.   vitamin C with rose hips 500 MG tablet Take 500 mg by mouth daily.        Allergies:  No Known Allergies  Family  History: Family History  Problem Relation Age of Onset   Hypertension Mother    Diabetes type II Father    Parkinson's disease Father    Heart disease Father     Social History:   reports that he has never smoked. He has never been exposed to tobacco smoke. He has never used smokeless tobacco. He reports that he does not drink alcohol and does not use drugs.  Physical Exam: BP 106/63   Pulse 90   Ht '5\' 7"'$  (1.702 m) Comment: per patient  Wt 214 lb (97.1 kg)   BMI 33.52 kg/m   Constitutional:  Alert and oriented, no acute distress, nontoxic appearing HEENT: [place], AT Cardiovascular: No clubbing, cyanosis, or edema Respiratory: Normal respiratory effort, no increased work of breathing Skin: No rashes, bruises or suspicious lesions Neurologic: Grossly intact, no focal deficits, moving all 4 extremities Psychiatric: Normal mood and affect  Assessment & Plan:   1. Erectile dysfunction, unspecified  erectile dysfunction type No improvement with demand dose tadalafil, though he is off daily low-dose tadalafil.  We discussed various options at this point including a trial of sildenafil as an alternative, intracavernosal injections, and referral to tertiary care center for consideration of IPP placement.  He is hesitant to pursue intracavernosal injections and has concerns about the cost of this.  Ultimately, he would like to try sildenafil. - tadalafil (CIALIS) 5 MG tablet; Take 1 tablet (5 mg total) by mouth daily as needed for erectile dysfunction.  Dispense: 30 tablet; Refill: 11 - sildenafil (REVATIO) 20 MG tablet; Take 3-5 tablets about one hour prior to intercourse. Do not exceed 5 tablets per dose.  Dispense: 30 tablet; Refill: 0  2. Benign prostatic hyperplasia with weak urinary stream Improved LUTS on low-dose daily tadalafil, will refill this. - tadalafil (CIALIS) 5 MG tablet; Take 1 tablet (5 mg total) by mouth daily as needed for erectile dysfunction.  Dispense: 30 tablet; Refill:  11  Return if symptoms worsen or fail to improve.  Debroah Loop, PA-C  [facility] [address] [place], [place] [address] [phone]

## 2022-03-31 NOTE — Patient Instructions (Signed)
I've sent in a prescription for the generic version of Viagra today, called sildenafil. You will receive 20mg tablets of this medication, but you may take up to 5 tablets at a time for a total dose of 100mg in a single dose. I recommend starting with 3 tablets and then increasing the dose if that is not sufficient to produce a satisfactory erection. ? ?You should plan to take the medication without food between 30 and 60 minutes prior to sexual activity. These medications work best to generate an erection when you attempt to manually stimulate one, so don't just wait for an erection to occur on its own.  ?

## 2022-04-04 ENCOUNTER — Other Ambulatory Visit: Payer: Self-pay | Admitting: Podiatry

## 2022-04-04 DIAGNOSIS — S93491A Sprain of other ligament of right ankle, initial encounter: Secondary | ICD-10-CM

## 2022-07-27 ENCOUNTER — Telehealth: Payer: Self-pay | Admitting: Podiatry

## 2022-07-27 NOTE — Telephone Encounter (Signed)
They received a fax from somewhere as there was no  fax number on the rx.   The rx was dated for 7.2023 and 5.14.2023 and they are asking if we could send over a new rx with the current date for insurance purposes please.

## 2022-07-27 NOTE — Telephone Encounter (Signed)
Got the signature needed and faxed to [facility] in [place].

## 2022-08-02 ENCOUNTER — Other Ambulatory Visit: Payer: Self-pay

## 2022-08-29 ENCOUNTER — Emergency Department: Payer: Medicaid Other

## 2022-08-29 ENCOUNTER — Emergency Department
Admission: EM | Admit: 2022-08-29 | Discharge: 2022-08-29 | Disposition: A | Discharge status: Home / Self Care | Payer: Medicaid Other | Attending: Emergency Medicine | Admitting: Emergency Medicine

## 2022-08-29 ENCOUNTER — Other Ambulatory Visit: Payer: Self-pay

## 2022-08-29 DIAGNOSIS — Y9389 Activity, other specified: Secondary | ICD-10-CM | POA: Diagnosis not present

## 2022-08-29 DIAGNOSIS — E119 Type 2 diabetes mellitus without complications: Secondary | ICD-10-CM | POA: Diagnosis not present

## 2022-08-29 DIAGNOSIS — I10 Essential (primary) hypertension: Secondary | ICD-10-CM | POA: Diagnosis not present

## 2022-08-29 DIAGNOSIS — W228XXA Striking against or struck by other objects, initial encounter: Secondary | ICD-10-CM | POA: Diagnosis not present

## 2022-08-29 DIAGNOSIS — M79605 Pain in left leg: Secondary | ICD-10-CM | POA: Diagnosis present

## 2022-08-29 DIAGNOSIS — L03116 Cellulitis of left lower limb: Secondary | ICD-10-CM

## 2022-08-29 DIAGNOSIS — S81802A Unspecified open wound, left lower leg, initial encounter: Secondary | ICD-10-CM | POA: Diagnosis not present

## 2022-08-29 LAB — COMPREHENSIVE METABOLIC PANEL
ALT: 17 U/L (ref 0–44)
AST: 15 U/L (ref 15–41)
Albumin: 4.2 g/dL (ref 3.5–5.0)
Alkaline Phosphatase: 77 U/L (ref 38–126)
Anion gap: 10 (ref 5–15)
BUN: 21 mg/dL — ABNORMAL HIGH (ref 6–20)
CO2: 24 mmol/L (ref 22–32)
Calcium: 8.6 mg/dL — ABNORMAL LOW (ref 8.9–10.3)
Chloride: 101 mmol/L (ref 98–111)
Creatinine, Ser: 1.15 mg/dL (ref 0.61–1.24)
GFR, Estimated: >60 mL/min (ref >60)
Glucose, Bld: 192 mg/dL — ABNORMAL HIGH (ref 70–99)
Potassium: 3.9 mmol/L (ref 3.5–5.1)
Sodium: 135 mmol/L (ref 135–145)
Total Bilirubin: 0.9 mg/dL (ref 0.3–1.2)
Total Protein: 7.3 g/dL (ref 6.5–8.1)

## 2022-08-29 LAB — CBC WITH DIFFERENTIAL/PLATELET
Abs Immature Granulocytes: 0.04 10*3/uL (ref 0.00–0.07)
Basophils Absolute: 0 10*3/uL (ref 0.0–0.1)
Basophils Relative: 0 %
Eosinophils Absolute: 0.2 10*3/uL (ref 0.0–0.5)
Eosinophils Relative: 2 %
HCT: 36.7 % — ABNORMAL LOW (ref 39.0–52.0)
Hemoglobin: 11.8 g/dL — ABNORMAL LOW (ref 13.0–17.0)
Immature Granulocytes: 1 %
Lymphocytes Relative: 22 %
Lymphs Abs: 1.8 10*3/uL (ref 0.7–4.0)
MCH: 31.1 pg (ref 26.0–34.0)
MCHC: 32.2 g/dL (ref 30.0–36.0)
MCV: 96.6 fL (ref 80.0–100.0)
Monocytes Absolute: 1 10*3/uL (ref 0.1–1.0)
Monocytes Relative: 12 %
Neutro Abs: 5.2 10*3/uL (ref 1.7–7.7)
Neutrophils Relative %: 63 %
Platelets: 222 10*3/uL (ref 150–400)
RBC: 3.8 MIL/uL — ABNORMAL LOW (ref 4.22–5.81)
RDW: 12.4 % (ref 11.5–15.5)
WBC: 8.3 10*3/uL (ref 4.0–10.5)
nRBC: 0 % (ref 0.0–0.2)

## 2022-08-29 LAB — URINALYSIS, ROUTINE W REFLEX MICROSCOPIC
Bacteria, UA: NONE SEEN
Bilirubin Urine: NEGATIVE
Glucose, UA: >=500 mg/dL — AB
Ketones, ur: NEGATIVE mg/dL
Leukocytes,Ua: NEGATIVE
Nitrite: NEGATIVE
Protein, ur: NEGATIVE mg/dL
Specific Gravity, Urine: 1.028 (ref 1.005–1.030)
Squamous Epithelial / HPF: NONE SEEN /HPF (ref 0–5)
pH: 5 (ref 5.0–8.0)

## 2022-08-29 LAB — LACTIC ACID, PLASMA: Lactic Acid, Venous: 0.8 mmol/L (ref 0.5–1.9)

## 2022-08-29 LAB — PROTIME-INR
INR: 1 (ref 0.8–1.2)
Prothrombin Time: 13.8 seconds (ref 11.4–15.2)

## 2022-08-29 MED ORDER — CEPHALEXIN 500 MG PO CAPS: 500.0000 mg | ORAL_CAPSULE | Freq: Four times a day (QID) | ORAL | 0 refills | Status: DC

## 2022-08-29 MED ORDER — VANCOMYCIN HCL 2000 MG/400ML IV SOLN
2000.0000 mg | Freq: Once | INTRAVENOUS | Status: AC
  Administered 2022-08-29: 2000 mg via INTRAVENOUS
  Filled 2022-08-29: qty 400

## 2022-08-29 MED ORDER — SODIUM CHLORIDE 0.9 % IV SOLN
2.0000 g | Freq: Once | INTRAVENOUS | Status: AC
  Administered 2022-08-29: 2 g via INTRAVENOUS
  Filled 2022-08-29: qty 20

## 2022-08-29 NOTE — ED Triage Notes (Signed)
Pt here with left leg pain since last Sun. Pt has a wound on the left leg with redness around it. Pt states he has been on abx but they have not helped. Pt does not have feeling in that leg due to his neuropathy and hx of DM. Pt sent here by his primary.

## 2022-08-29 NOTE — ED Provider Notes (Signed)
[facility] Provider Note    Event Date/Time   First MD Initiated Contact with Patient 08/29/22 1452     (approximate)   History   Leg Pain   HPI  Fernando Vega is a 52 y.o. male  with pmh DM and HTN, peripheral neuropathy who p/w LLE cellulitis.  Patient tells me a little over a week ago he bumped his left calf on a brick when he was moving an object.  Next day he developed some redness around the scrape.  He saw his primary doctor and was prescribed a course of Keflex which he finished yesterday.  He has also had Bactrim added on today which he took for the second time.  He has received 3 doses of IM Rocephin in the office as well.  His primary doctor sent him here to rule out osteomyelitis.  Patient tells me he thinks it is actually getting better.  He has minimal pain but does have neuropathy.  Denies fevers chills or other systemic symptoms.    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   Acute osteomyelitis of right foot (HCC) 01/07/2021   Essential hypertension 12/30/2020   Sleep apnea 12/11/2020   Anemia 12/11/2020   Osteomyelitis (HCC) 12/11/2020   Encounter to establish care 12/02/2020   Leukocytosis    Puncture wound 11/01/2020   Type 2 diabetes mellitus with peripheral neuropathy (HCC) 11/01/2020   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      Excl. in GC?     Most recent vital signs: Vitals:   08/29/22 1845 08/29/22 1859  BP:  127/83  Pulse: 86 87  Resp:  19  Temp:  97.8 F (36.6 C)  SpO2: 92% 94%     General: Awake, no distress.  CV:  Good peripheral perfusion.  Resp:  Normal effort.  Abd:  No distention.  Neuro:             Awake, Alert, Oriented x 3  Other:  Pitting edema in the bilateral lower extremities, left greater than right, there is a wound on the left mid calf with some exudate no active drainage with some surrounding erythema and warmth, no crepitus, nontender 2+ DP pulses   ED Results / Procedures / Treatments  Labs (all labs ordered are listed,  but only abnormal results are displayed) Labs Reviewed  COMPREHENSIVE METABOLIC PANEL - Abnormal; Notable for the following components:      Result Value   Glucose, Bld 192 (*)    BUN 21 (*)    Calcium 8.6 (*)    All other components within normal limits  CBC WITH DIFFERENTIAL/PLATELET - Abnormal; Notable for the following components:   RBC 3.80 (*)    Hemoglobin 11.8 (*)    HCT 36.7 (*)    All other components within normal limits  URINALYSIS, ROUTINE W REFLEX MICROSCOPIC - Abnormal; Notable for the following components:   Color, Urine STRAW (*)    APPearance CLEAR (*)    Glucose, UA >=500 (*)    Hgb urine dipstick SMALL (*)    All other components within normal limits  CULTURE, BLOOD (ROUTINE X 2)  CULTURE, BLOOD (ROUTINE X 2)  LACTIC ACID, PLASMA  PROTIME-INR     EKG     RADIOLOGY I reviewed and interpreted patient's x-ray of the left leg which is negative for soft tissue gas or osteomyelitis   PROCEDURES:  Critical Care performed: No  Procedures  The patient is on the cardiac monitor to evaluate for evidence of arrhythmia  and/or significant heart rate changes.   MEDICATIONS ORDERED IN ED: Medications  cefTRIAXone (ROCEPHIN) 2 g in sodium chloride 0.9 % 100 mL IVPB (0 g Intravenous Stopped 08/29/22 1654)  vancomycin (VANCOREADY) IVPB 2000 mg/400 mL (0 mg Intravenous Stopped 08/29/22 1859)     IMPRESSION / MDM / ASSESSMENT AND PLAN / ED COURSE  I reviewed the triage vital signs and the nursing notes.                              Patient's presentation is most consistent with acute complicated illness / injury requiring diagnostic workup.  Differential diagnosis includes, but is not limited to, cellulitis, abscess, low suspicion for necrotizing infection myositis, osteomyelitis, venous stasis  The patient is a 52 year old male with underlying diabetes and chronic swelling of his lower extremities who presents today with ongoing left lower extremity cellulitis which is currently being treated as an outpatient.  This started after he had a skin wound after he scraped his leg about a week ago.  He has been on Keflex and recently switched to Bactrim and has also had 3 doses of IM Rocephin as an outpatient.  Patient has a note from his primary doctor but says she wanted to rule out osteomyelitis or abscess.  Patient's vital signs are overall reassuring.  On exam he looks quite well.  He does have pitting edema in the bilateral lower extremities.  There is a superficial wound on the lateral left calf which has some exudate but is not actively draining and does not appear deep.  The lower extremity does appear mildly cellulitic with some mild warmth there is no crepitus he has good pulses.   Overall I do still think that this is a cellulitis which is likely taking longer to heal given patient's underlying diabetes and his venous stasis.  Will obtain x-ray to screen for osteomyelitis but clinically my suspicion is low.  Patient's labs are reassuring normal lactate no leukocytosis.  X-rays negative.  Did give a dose of  Vanco and Rocephin in the ED.  I advised him to continue the Bactrim and I also extended his dose of Keflex for an extra 5 days for additional strep coverage.  Discussed  the importance of elevation and compression to help with his overall healing.  Discussed ongoing follow-up with primary care and return precautions.       FINAL CLINICAL IMPRESSION(S) / ED DIAGNOSES   Final diagnoses:  None     Rx / DC Orders   ED Discharge Orders          Ordered    cephALEXin (KEFLEX) 500 MG capsule  4 times daily        08/29/22 1703             Note:  This document was prepared using Dragon voice recognition software and may include unintentional dictation errors.   Georga Hacking, MD 08/29/22 2259

## 2022-08-29 NOTE — ED Provider Triage Note (Signed)
Emergency Medicine Provider Triage Evaluation Note  Fernando Vega , a 52 y.o. male  was evaluated in triage.  Pt complains of left leg wound not healing. Seen PCP multiple times and has taken Keflex, Bactrim DS and Rocephin IM x 3.   Has a history of osteomyelitis/ Amputation right foot.   Review of Systems  Positive: Open wound with redness.  Negative: No fever, chills, N/V.   Physical Exam  BP 114/86   Pulse 90   Temp (!) 97.5 F (36.4 C) (Oral)   Resp 18   Ht 5\' 7"  (1.702 m)   Wt 97.1 kg   SpO2 96%   BMI 33.53 kg/m  Gen:   Awake, no distress   Resp:  Normal effort  MSK:   Open wound left lower extremity without active drainage.  Erythema surrounding.   Other:    Medical Decision Making  Medically screening exam initiated at 12:50 PM.  Appropriate orders placed.  Fernando Vega was informed that the remainder of the evaluation will be completed by another provider, this initial triage assessment does not replace that evaluation, and the importance of remaining in the ED until their evaluation is complete.     Tommi Rumps, PA-C 08/29/22 1255

## 2022-08-29 NOTE — Discharge Instructions (Addendum)
Please continue to take the Bactrim that you have been prescribed.  I would also take another 5 days of Keflex which I prescribed to you.  Please make sure you are elevating the leg and wearing of compression stocking to help with swelling which will help with your healing.  If you start notice the redness expanding or you develop fevers or feeling generally unwell then please return to the emergency department.

## 2022-08-31 LAB — CULTURE, BLOOD (ROUTINE X 2): Culture: NO GROWTH

## 2022-09-01 LAB — CULTURE, BLOOD (ROUTINE X 2): Culture: NO GROWTH

## 2022-09-02 ENCOUNTER — Observation Stay
Admission: EM | Admit: 2022-09-02 | Discharge: 2022-09-03 | Disposition: A | Discharge status: Home / Self Care | Payer: Medicaid Other | Attending: Internal Medicine | Admitting: Internal Medicine

## 2022-09-02 ENCOUNTER — Other Ambulatory Visit: Payer: Self-pay

## 2022-09-02 ENCOUNTER — Emergency Department: Payer: Medicaid Other

## 2022-09-02 ENCOUNTER — Observation Stay: Payer: Medicaid Other

## 2022-09-02 DIAGNOSIS — R0603 Acute respiratory distress: Secondary | ICD-10-CM | POA: Diagnosis not present

## 2022-09-02 DIAGNOSIS — R0602 Shortness of breath: Secondary | ICD-10-CM

## 2022-09-02 DIAGNOSIS — Z1152 Encounter for screening for COVID-19: Secondary | ICD-10-CM | POA: Diagnosis not present

## 2022-09-02 DIAGNOSIS — E1142 Type 2 diabetes mellitus with diabetic polyneuropathy: Secondary | ICD-10-CM | POA: Diagnosis not present

## 2022-09-02 DIAGNOSIS — G4730 Sleep apnea, unspecified: Secondary | ICD-10-CM | POA: Diagnosis not present

## 2022-09-02 DIAGNOSIS — I10 Essential (primary) hypertension: Secondary | ICD-10-CM | POA: Diagnosis present

## 2022-09-02 DIAGNOSIS — D869 Sarcoidosis, unspecified: Secondary | ICD-10-CM | POA: Insufficient documentation

## 2022-09-02 DIAGNOSIS — Z794 Long term (current) use of insulin: Secondary | ICD-10-CM | POA: Diagnosis not present

## 2022-09-02 DIAGNOSIS — E1140 Type 2 diabetes mellitus with diabetic neuropathy, unspecified: Secondary | ICD-10-CM | POA: Insufficient documentation

## 2022-09-02 DIAGNOSIS — Z79899 Other long term (current) drug therapy: Secondary | ICD-10-CM | POA: Insufficient documentation

## 2022-09-02 DIAGNOSIS — J810 Acute pulmonary edema: Secondary | ICD-10-CM | POA: Diagnosis not present

## 2022-09-02 LAB — CBC
HCT: 36 % — ABNORMAL LOW (ref 39.0–52.0)
Hemoglobin: 11.6 g/dL — ABNORMAL LOW (ref 13.0–17.0)
MCH: 31.1 pg (ref 26.0–34.0)
MCHC: 32.2 g/dL (ref 30.0–36.0)
MCV: 96.5 fL (ref 80.0–100.0)
Platelets: 250 10*3/uL (ref 150–400)
RBC: 3.73 MIL/uL — ABNORMAL LOW (ref 4.22–5.81)
RDW: 12.2 % (ref 11.5–15.5)
WBC: 8 10*3/uL (ref 4.0–10.5)
nRBC: 0 % (ref 0.0–0.2)

## 2022-09-02 LAB — CULTURE, BLOOD (ROUTINE X 2)

## 2022-09-02 LAB — CBG MONITORING, ED
Glucose-Capillary: 148 mg/dL — ABNORMAL HIGH (ref 70–99)
Glucose-Capillary: 201 mg/dL — ABNORMAL HIGH (ref 70–99)

## 2022-09-02 LAB — HEPATIC FUNCTION PANEL
ALT: 21 U/L (ref 0–44)
AST: 15 U/L (ref 15–41)
Albumin: 3.9 g/dL (ref 3.5–5.0)
Alkaline Phosphatase: 88 U/L (ref 38–126)
Bilirubin, Direct: <0.1 mg/dL (ref 0.0–0.2)
Total Bilirubin: 0.8 mg/dL (ref 0.3–1.2)
Total Protein: 7.6 g/dL (ref 6.5–8.1)

## 2022-09-02 LAB — BASIC METABOLIC PANEL
Anion gap: 9 (ref 5–15)
BUN: 22 mg/dL — ABNORMAL HIGH (ref 6–20)
CO2: 22 mmol/L (ref 22–32)
Calcium: 8.2 mg/dL — ABNORMAL LOW (ref 8.9–10.3)
Chloride: 101 mmol/L (ref 98–111)
Creatinine, Ser: 0.91 mg/dL (ref 0.61–1.24)
GFR, Estimated: >60 mL/min (ref >60)
Glucose, Bld: 237 mg/dL — ABNORMAL HIGH (ref 70–99)
Potassium: 4.2 mmol/L (ref 3.5–5.1)
Sodium: 132 mmol/L — ABNORMAL LOW (ref 135–145)

## 2022-09-02 LAB — TROPONIN I (HIGH SENSITIVITY): Troponin I (High Sensitivity): 5 ng/L (ref <18)

## 2022-09-02 LAB — BRAIN NATRIURETIC PEPTIDE: B Natriuretic Peptide: 65.3 pg/mL (ref 0.0–100.0)

## 2022-09-02 LAB — PROCALCITONIN: Procalcitonin: <0.1 ng/mL

## 2022-09-02 LAB — LACTIC ACID, PLASMA: Lactic Acid, Venous: 0.7 mmol/L (ref 0.5–1.9)

## 2022-09-02 LAB — SARS CORONAVIRUS 2 BY RT PCR: SARS Coronavirus 2 by RT PCR: NEGATIVE

## 2022-09-02 LAB — TSH: TSH: 2.498 u[IU]/mL (ref 0.350–4.500)

## 2022-09-02 MED ORDER — ALFUZOSIN HCL ER 10 MG PO TB24
10.0000 mg | ORAL_TABLET | Freq: Every day | ORAL | Status: DC
  Administered 2022-09-03: 10 mg via ORAL
  Filled 2022-09-02: qty 1

## 2022-09-02 MED ORDER — LOSARTAN POTASSIUM 50 MG PO TABS
25.0000 mg | ORAL_TABLET | Freq: Every day | ORAL | Status: DC
  Administered 2022-09-03: 25 mg via ORAL
  Filled 2022-09-02: qty 1

## 2022-09-02 MED ORDER — ACETAMINOPHEN 325 MG PO TABS: 650.0000 mg | ORAL_TABLET | Freq: Four times a day (QID) | ORAL | Status: DC | PRN

## 2022-09-02 MED ORDER — ATORVASTATIN CALCIUM 20 MG PO TABS
20.0000 mg | ORAL_TABLET | Freq: Every day | ORAL | Status: DC
  Administered 2022-09-03: 20 mg via ORAL
  Filled 2022-09-02: qty 1

## 2022-09-02 MED ORDER — PANTOPRAZOLE SODIUM 40 MG PO TBEC
40.0000 mg | DELAYED_RELEASE_TABLET | Freq: Every day | ORAL | Status: DC
  Administered 2022-09-03: 40 mg via ORAL
  Filled 2022-09-02: qty 1

## 2022-09-02 MED ORDER — ACETAMINOPHEN 650 MG RE SUPP: 650.0000 mg | Freq: Four times a day (QID) | RECTAL | Status: DC | PRN

## 2022-09-02 MED ORDER — INSULIN GLARGINE-YFGN 100 UNIT/ML ~~LOC~~ SOLN
10.0000 [IU] | Freq: Every day | SUBCUTANEOUS | Status: DC
  Administered 2022-09-02: 10 [IU] via SUBCUTANEOUS
  Filled 2022-09-02 (×2): qty 0.1

## 2022-09-02 MED ORDER — SODIUM CHLORIDE 0.9 % IV SOLN: 2.0000 g | INTRAVENOUS | Status: DC

## 2022-09-02 MED ORDER — AMLODIPINE BESYLATE 5 MG PO TABS
10.0000 mg | ORAL_TABLET | Freq: Every day | ORAL | Status: DC
  Administered 2022-09-03: 10 mg via ORAL
  Filled 2022-09-02: qty 2

## 2022-09-02 MED ORDER — SODIUM CHLORIDE 0.9 % IV SOLN
500.0000 mg | Freq: Once | INTRAVENOUS | Status: AC
  Administered 2022-09-02: 500 mg via INTRAVENOUS
  Filled 2022-09-02: qty 5

## 2022-09-02 MED ORDER — GABAPENTIN 300 MG PO CAPS
300.0000 mg | ORAL_CAPSULE | Freq: Every day | ORAL | Status: DC
  Filled 2022-09-02: qty 1

## 2022-09-02 MED ORDER — INSULIN ASPART 100 UNIT/ML IJ SOLN
0.0000 [IU] | Freq: Three times a day (TID) | INTRAMUSCULAR | Status: DC
  Administered 2022-09-03: 3 [IU] via SUBCUTANEOUS
  Filled 2022-09-02: qty 1

## 2022-09-02 MED ORDER — FUROSEMIDE 10 MG/ML IJ SOLN
40.0000 mg | Freq: Once | INTRAMUSCULAR | Status: AC
  Administered 2022-09-02: 40 mg via INTRAVENOUS
  Filled 2022-09-02: qty 4

## 2022-09-02 MED ORDER — ONDANSETRON HCL 4 MG PO TABS: 4.0000 mg | ORAL_TABLET | Freq: Four times a day (QID) | ORAL | Status: DC | PRN

## 2022-09-02 MED ORDER — SODIUM CHLORIDE 0.9 % IV SOLN: 500.0000 mg | INTRAVENOUS | Status: DC

## 2022-09-02 MED ORDER — POLYETHYLENE GLYCOL 3350 17 G PO PACK: 17.0000 g | PACK | Freq: Every day | ORAL | Status: DC | PRN

## 2022-09-02 MED ORDER — ENOXAPARIN SODIUM 60 MG/0.6ML IJ SOSY
0.5000 mg/kg | PREFILLED_SYRINGE | INTRAMUSCULAR | Status: DC
  Administered 2022-09-02: 50 mg via SUBCUTANEOUS
  Filled 2022-09-02: qty 0.6

## 2022-09-02 MED ORDER — FUROSEMIDE 10 MG/ML IJ SOLN
40.0000 mg | Freq: Every day | INTRAMUSCULAR | Status: DC
  Administered 2022-09-03: 40 mg via INTRAVENOUS
  Filled 2022-09-02: qty 4

## 2022-09-02 MED ORDER — SODIUM CHLORIDE 0.9% FLUSH
3.0000 mL | Freq: Two times a day (BID) | INTRAVENOUS | Status: DC
  Administered 2022-09-02 – 2022-09-03 (×2): 3 mL via INTRAVENOUS

## 2022-09-02 MED ORDER — SODIUM CHLORIDE 0.9 % IV SOLN
2.0000 g | Freq: Once | INTRAVENOUS | Status: AC
  Administered 2022-09-02: 2 g via INTRAVENOUS
  Filled 2022-09-02: qty 20

## 2022-09-02 MED ORDER — ONDANSETRON HCL 4 MG/2ML IJ SOLN: 4.0000 mg | Freq: Four times a day (QID) | INTRAMUSCULAR | Status: DC | PRN

## 2022-09-02 NOTE — Assessment & Plan Note (Signed)
-   Hold home antiglycemic agents - SSI, moderate - Semglee 10 units at bedtime 

## 2022-09-02 NOTE — ED Provider Notes (Signed)
[facility] Provider Note    Event Date/Time   First MD Initiated Contact with Patient 09/02/22 1547     (approximate)   History   Shortness of Breath   HPI  Fernando Vega is a 52 y.o. male here with shortness of breath.  The patient states that for the last day, he has had progressively worsening shortness of breath at rest.  He has noticed has been slightly more short of breath with exertion and lying flat for the last several days.  He was seen here recently for cellulitis and states he had significant increase in bilateral leg swelling.  Was gained over 12 pounds due to fluid gain.  States he has had some shortness of breath, initially with exertion and lying flat but now present at rest.  Denies any chest pain.  Denies any history of heart failure.  Denies history renal failure.     Physical Exam   Triage Vital Signs: ED Triage Vitals  Enc Vitals Group     BP 09/02/22 1509 (!) 157/81     Pulse Rate 09/02/22 1509 92     Resp 09/02/22 1509 (!) 22     Temp 09/02/22 1509 98.1 F (36.7 C)     Temp Source 09/02/22 1509 Oral     SpO2 09/02/22 1509 96 %     Weight 09/02/22 1507 222 lb (100.7 kg)     Height 09/02/22 1507 5\' 7"  (1.702 m)     Head Circumference --      Peak Flow --      Pain Score 09/02/22 1507 0     Pain Loc --      Pain Edu? --      Excl. in GC? --     Most recent vital signs: Vitals:   09/02/22 2315 09/02/22 2330  BP:    Pulse: 89 90  Resp: 15 17  Temp:    SpO2:       General: Awake, no distress.  CV:  Good peripheral perfusion.  Regular rate and rhythm.  No murmurs. Resp:  Normal work of breathing.  Bilateral rales, slight tachypnea. Abd:  No distention.  No tenderness. Other:  3+ pitting edema bilateral lower extremities.  Superficial ulceration to the left anterior leg, no significant induration.   ED Results / Procedures / Treatments   Labs (all labs ordered are listed, but only abnormal results are  displayed) Labs Reviewed  BASIC METABOLIC PANEL - Abnormal; Notable for the following components:      Result Value   Sodium 132 (*)    Glucose, Bld 237 (*)    BUN 22 (*)    Calcium 8.2 (*)    All other components within normal limits  CBC - Abnormal; Notable for the following components:   RBC 3.73 (*)    Hemoglobin 11.6 (*)    HCT 36.0 (*)    All other components within normal limits  CBG MONITORING, ED - Abnormal; Notable for the following components:   Glucose-Capillary 148 (*)    All other components within normal limits  CBG MONITORING, ED - Abnormal; Notable for the following components:   Glucose-Capillary 201 (*)    All other components within normal limits  SARS CORONAVIRUS 2 BY RT PCR  CULTURE, BLOOD (ROUTINE X 2)  CULTURE, BLOOD (ROUTINE X 2)  LACTIC ACID, PLASMA  BRAIN NATRIURETIC PEPTIDE  PROCALCITONIN  HEPATIC FUNCTION PANEL  TSH  HIV ANTIBODY (ROUTINE TESTING W REFLEX)  BASIC METABOLIC PANEL  CBC  HEMOGLOBIN A1C  TROPONIN I (HIGH SENSITIVITY)     EKG Normal sinus rhythm, ventricular 92.  PR 164, QRS 80, QTc 427.  No acute ST elevations or depression EKG evidence of acute ischemia or infarct.   RADIOLOGY Chest x-ray: Extensive new heterogenous consolidative airspace opacity, diffuse bilateral interstitial opacity   I also independently reviewed and agree with radiologist interpretations.   PROCEDURES:  Critical Care performed: No  .1-3 Lead EKG Interpretation  Performed by: Shaune Pollack, MD Authorized by: Shaune Pollack, MD     Interpretation: normal     ECG rate:  80-100   ECG rate assessment: normal     Rhythm: sinus rhythm     Ectopy: none     Conduction: normal   Comments:     Indication: SOB    MEDICATIONS ORDERED IN ED: Medications  enoxaparin (LOVENOX) injection 50 mg (50 mg Subcutaneous Given 09/02/22 2244)  sodium chloride flush (NS) 0.9 % injection 3 mL (3 mLs Intravenous Given 09/02/22 2246)  acetaminophen (TYLENOL)  tablet 650 mg (has no administration in time range)    Or  acetaminophen (TYLENOL) suppository 650 mg (has no administration in time range)  ondansetron (ZOFRAN) tablet 4 mg (has no administration in time range)    Or  ondansetron (ZOFRAN) injection 4 mg (has no administration in time range)  polyethylene glycol (MIRALAX / GLYCOLAX) packet 17 g (has no administration in time range)  insulin aspart (novoLOG) injection 0-15 Units (has no administration in time range)  insulin glargine-yfgn (SEMGLEE) injection 10 Units (10 Units Subcutaneous Given 09/02/22 2244)  furosemide (LASIX) injection 40 mg (has no administration in time range)  cefTRIAXone (ROCEPHIN) 2 g in sodium chloride 0.9 % 100 mL IVPB (has no administration in time range)  azithromycin (ZITHROMAX) 500 mg in sodium chloride 0.9 % 250 mL IVPB (has no administration in time range)  alfuzosin (UROXATRAL) 24 hr tablet 10 mg (has no administration in time range)  amLODipine (NORVASC) tablet 10 mg (has no administration in time range)  atorvastatin (LIPITOR) tablet 20 mg (has no administration in time range)  gabapentin (NEURONTIN) capsule 300 mg (300 mg Oral Not Given 09/02/22 2240)  losartan (COZAAR) tablet 25 mg (has no administration in time range)  pantoprazole (PROTONIX) EC tablet 40 mg (has no administration in time range)  cefTRIAXone (ROCEPHIN) 2 g in sodium chloride 0.9 % 100 mL IVPB (0 g Intravenous Stopped 09/02/22 1709)  azithromycin (ZITHROMAX) 500 mg in sodium chloride 0.9 % 250 mL IVPB (0 mg Intravenous Stopped 09/02/22 1823)  furosemide (LASIX) injection 40 mg (40 mg Intravenous Given 09/02/22 1633)     IMPRESSION / MDM / ASSESSMENT AND PLAN / ED COURSE  I reviewed the triage vital signs and the nursing notes.                              Differential diagnosis includes, but is not limited to, new onset CHF, AKI with pulm edema, liver failure, PNA, pneumonitis, PE, ACS  Patient's presentation is most consistent with  acute presentation with potential threat to life or bodily function.  The patient is on the cardiac monitor to evaluate for evidence of arrhythmia and/or significant heart rate changes  52 yo M here with SOB. Clinically, pt is hypervolemic with significant LE edema. Suspect CHF vs atypical PNA. CXR read as possible PNA versus edema. CBC without leukocytosis. BMP at baseline. BNP normal. Trop  negative. Will admit.   FINAL CLINICAL IMPRESSION(S) / ED DIAGNOSES   Final diagnoses:  Shortness of breath  Acute pulmonary edema (HCC)     Rx / DC Orders   ED Discharge Orders     None        Note:  This document was prepared using Dragon voice recognition software and may include unintentional dictation errors.   Shaune Pollack, MD 09/03/22 0200

## 2022-09-02 NOTE — ED Triage Notes (Signed)
Pt arrives to ED with SOB. Pt sts that the SOB got worse last night. Pt sts that its worse when he walks vs when he is sitting.

## 2022-09-02 NOTE — Assessment & Plan Note (Signed)
-   CPAP at bedtime 

## 2022-09-02 NOTE — Assessment & Plan Note (Addendum)
Patient is presenting with rather quick onset shortness of breath over the last 1 to 2 days in addition to 20 pound weight gain and bilateral lower extremity edema. Unclear etiology, as recent echocardiogram with no evidence of systolic or diastolic dysfunction and his BNP is interestingly within normal limits.  Chest x-ray with possible pneumonia however white blood count is within normal limits and patient is afebrile. No evidence of proteinuria on recent UA. No hx of liver disease.   - Telemetry monitoring - Continue azithromycin and ceftriaxone - Continue daily Lasix 40 mg IV - Daily weights - Strict in and out - PT/OT

## 2022-09-02 NOTE — Assessment & Plan Note (Signed)
Continue home antihypertensives 

## 2022-09-02 NOTE — Assessment & Plan Note (Signed)
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  Follows with pulmonology.

## 2022-09-02 NOTE — H&P (Signed)
History and Physical    Patient: Fernando Vega MVH:[id number] DOB: 03/10/1971 DOA: 09/02/2022 DOS: the patient was seen and examined on 09/02/2022 PCP: [facility]  Patient coming from: Home  Chief Complaint:  Chief Complaint  Patient presents with   Shortness of Breath   HPI: BOLIVAR KORANDA is a 52 y.o. male with medical history significant of sarcoidosis, severe sleep apnea, hypertension, type 2 diabetes, who presents to the ED due to shortness of breath.  Mr. Maybee states that over the last 2 days, he has been experiencing worsening shortness of breathe, both at rest and with exertion. Then last night, he kept waking up in his sleep feeling as though he was choking on his own saliva. He endorses a chronic cough that is unchanged. He notes a 20 lb weight gain over the last 2 weeks with worsening lower extremity swelling. He endorses chills and 1 x episode of nausea but denies any fever, vomiting, abdominal pain, diarrhea. He denies any chest pain or palpitations. He has not been wearing a CPAP in several years.    ED course: On arrival to the ED, patient was hypertensive at 157/81 with heart rate of 92.  He was saturating at 96% on room air.  He was afebrile at 98.1.  Initial workup notable for WBC of 8.0, hemoglobin 11.6, sodium 132, glucose 237, BUN 22, creatinine 0.91, and GFR above 60.  Troponin negative at 5.  BNP within normal limits at 65.  COVID-19 PCR negative. Chest x-ray was obtained that demonstrated extensive new heterogenous and consolidative airspace opacity throughout the right mid lung field and right base in addition to diffuse bilateral interstitial opacities.  Patient started on azithromycin, Rocephin, and Lasix.  TRH contacted for admission.  Review of Systems: As mentioned in the history of present illness. All other systems reviewed and are negative.  Past Medical History:  Diagnosis Date   Diabetes mellitus without complication (HCC)    Hypertension     Neuropathy    Sarcoidosis    Sleep apnea    Past Surgical History:  Procedure Laterality Date   AMPUTATION Right 12/13/2020   Procedure: AMPUTATION midFOOT-RIGHT;  Surgeon: Edwin Cap, DPM;  Location: [facility];  Service: Podiatry;  Laterality: Right;   AMPUTATION Right 11/05/2020   Procedure: AMPUTATION RAY-Partial Ray Amp 2nd & 3rd Digit;  Surgeon: Felecia Shelling, DPM;  Location: [facility];  Service: Podiatry;  Laterality: Right;   APPLICATION OF WOUND VAC Right 11/09/2020   Procedure: APPLICATION OF WOUND VAC;  Surgeon: Felecia Shelling, DPM;  Location: [facility];  Service: Podiatry;  Laterality: Right;   BONE BIOPSY Right 01/08/2021   Procedure: BONE BIOPSY;  Surgeon: Vivi Barrack, DPM;  Location: [facility];  Service: Podiatry;  Laterality: Right;   COLONOSCOPY WITH PROPOFOL N/A 07/19/2021   Procedure: COLONOSCOPY WITH PROPOFOL;  Surgeon: Wyline Mood, MD;  Location: [facility]COPY;  Service: Gastroenterology;  Laterality: N/A;   COLONOSCOPY WITH PROPOFOL N/A 11/23/2021   Procedure: COLONOSCOPY WITH PROPOFOL;  Surgeon: Wyline Mood, MD;  Location: [facility] ENDOSCOPY;  Service: Gastroenterology;  Laterality: N/A;   INCISION AND DRAINAGE Right 11/01/2020   Procedure: INCISION AND DRAINAGE-Right Foot;  Surgeon: Edwin Cap, DPM;  Location: [facility];  Service: Podiatry;  Laterality: Right;   IRRIGATION AND DEBRIDEMENT FOOT Right 11/09/2020   Procedure: IRRIGATION AND DEBRIDEMENT FOOT;  Surgeon: Felecia Shelling, DPM;  Location: [facility];  Service: Podiatry;  Laterality: Right;   IRRIGATION AND DEBRIDEMENT  FOOT Right 12/13/2020   Procedure: IRRIGATION AND DEBRIDEMENT FOOT;  Surgeon: Edwin Cap, DPM;  Location: [facility];  Service: Podiatry;  Laterality: Right;   TRANSMETATARSAL AMPUTATION Right 01/08/2021   Procedure: LISFRANC  AMPUTATION;  Surgeon: Vivi Barrack, DPM;  Location: [facility];  Service: Podiatry;  Laterality: Right;   WOUND DEBRIDEMENT Right 12/16/2020   Procedure:  DEBRIDEMENT WOUND;  Surgeon: Candelaria Stagers, DPM;  Location: [facility];  Service: Podiatry;  Laterality: Right;  Transmet amputation washout & closure   WOUND DEBRIDEMENT Right 01/08/2021   Procedure: DEBRIDEMENT WOUND;  Surgeon: Vivi Barrack, DPM;  Location: [facility];  Service: Podiatry;  Laterality: Right;   Social History:  reports that he has never smoked. He has never been exposed to tobacco smoke. He has never used smokeless tobacco. He reports that he does not drink alcohol and does not use drugs.  No Known Allergies  Family History  Problem Relation Age of Onset   Hypertension Mother    Diabetes type II Father    Parkinson's disease Father    Heart disease Father     Prior to Admission medications   Medication Sig Start Date End Date Taking? Authorizing Provider  acetaminophen (TYLENOL) 500 MG tablet Take 1 tablet (500 mg total) by mouth every 6 (six) hours as needed for mild pain (or Fever >/= 101). 12/19/20   Lonia Blood, MD  albuterol (VENTOLIN HFA) 108 (90 Base) MCG/ACT inhaler Inhale 2 puffs into the lungs once every 6 (six) hours as needed for wheezing or shortness of breath. 01/27/21   Iloabachie, Chioma E, NP  alfuzosin (UROXATRAL) 10 MG 24 hr tablet Take 1 tablet (10 mg total) by mouth daily with breakfast. 01/20/22   Vaillancourt, Lelon Mast, PA-C  amLODipine (NORVASC) 10 MG tablet Take 1 tablet (10 mg total) by mouth once daily. 12/30/20   Iloabachie, Chioma E, NP  Ascorbic Acid (VITAMIN C WITH ROSE HIPS) 500 MG tablet Take 500 mg by mouth daily.    [provider]  atorvastatin (LIPITOR) 20 MG tablet Take 20 mg by mouth daily. 05/27/21   [provider]  benzonatate (TESSALON PERLES) 100 MG capsule 1-2 every 8 hours if needed cough 08/15/21   Bridget Hartshorn L, PA-C  cephALEXin (KEFLEX) 500 MG capsule Take 1 capsule (500 mg total) by mouth 4 (four) times daily for 5 days. 08/29/22 09/03/22  Georga Hacking, MD  cetirizine (ZYRTEC ALLERGY) 10 MG  tablet Take 1 tablet (10 mg total) by mouth daily. For allergy systems 08/15/21 08/15/22  Tommi Rumps, PA-C  gabapentin (NEURONTIN) 300 MG capsule Take 1 capsule (300 mg total) by mouth once daily at bedtime. 12/30/20   Iloabachie, Chioma E, NP  hydrOXYzine (ATARAX) 25 MG tablet Take 1 tablet (25 mg total) by mouth every 6 (six) hours as needed for itching. 09/04/21   Tommi Rumps, PA-C  Insulin Glargine (BASAGLAR KWIKPEN) 100 UNIT/ML Inject 15 Units into the skin at bedtime. 12/30/20   Iloabachie, Chioma E, NP  insulin lispro (HUMALOG KWIKPEN) 100 UNIT/ML KwikPen Inject 7 Units into the skin 3 (three) times daily before meals. 01/27/21   Iloabachie, Chioma E, NP  Insulin Pen Needle 32G X 6 MM MISC USE AS DIRECTED WITH INSULIN PENS. 12/30/20   Iloabachie, Chioma E, NP  losartan (COZAAR) 25 MG tablet Take 25 mg by mouth daily. 12/05/21   [provider]  Multiple Vitamin (MULTIVITAMIN WITH MINERALS) TABS tablet Take 1 tablet by mouth once daily.  11/13/20   Wieting, Richard, MD  OZEMPIC, 0.25 OR 0.5 MG/DOSE, 2 MG/3ML SOPN Inject into the skin. 12/09/21   [provider]  pantoprazole (PROTONIX) 40 MG tablet Take 1 tablet (40 mg total) by mouth once daily. 12/30/20   Iloabachie, Chioma E, NP  sildenafil (REVATIO) 20 MG tablet Take 3-5 tablets about one hour prior to intercourse. Do not exceed 5 tablets per dose. 03/31/22   Vaillancourt, Lelon Mast, PA-C  tadalafil (CIALIS) 5 MG tablet Take 1 tablet (5 mg total) by mouth daily as needed for erectile dysfunction. 03/31/22   Carman Ching, PA-C    Physical Exam: Vitals:   09/02/22 1507 09/02/22 1509 09/02/22 1718  BP:  (!) 157/81 (!) 158/82  Pulse:  92 92  Resp:  (!) 22 20  Temp:  98.1 F (36.7 C) 97.8 F (36.6 C)  TempSrc:  Oral Oral  SpO2:  96% 94%  Weight: 100.7 kg    Height: 5\' 7"  (1.702 m)     Physical Exam Vitals and nursing note reviewed.  Constitutional:      General: He is not in acute distress.     Appearance: He is obese. He is not toxic-appearing.  HENT:     Head: Normocephalic and atraumatic.  Eyes:     Extraocular Movements: Extraocular movements intact.     Pupils: Pupils are equal, round, and reactive to light.  Neck:     Vascular: JVD present.  Cardiovascular:     Rate and Rhythm: Normal rate and regular rhythm.     Heart sounds: No murmur heard.    Comments: Edema extends past knees Pulmonary:     Effort: Pulmonary effort is normal. Tachypnea present.     Breath sounds: Rales present. No decreased breath sounds, wheezing or rhonchi.  Abdominal:     Palpations: Abdomen is soft.     Tenderness: There is no abdominal tenderness.  Musculoskeletal:     Right lower leg: 1+ Pitting Edema present.     Left lower leg: 1+ Pitting Edema present.  Skin:    General: Skin is warm and dry.  Neurological:     General: No focal deficit present.     Mental Status: He is alert and oriented to person, place, and time.  Psychiatric:        Mood and Affect: Mood normal.        Behavior: Behavior normal.     Data Reviewed: CBC with WBC of 8.0, hemoglobin 11.6, and platelets of 250 BMP with sodium of 132, potassium 4.2, bicarb 22, glucose 237, BUN 22, creatinine 0.91 with GFR above 60 Hepatic function panel within normal limits BNP within normal limits 66 Troponin negative at 5 Lactic acid within normal limits at 0.7 COVID-19 PCR negative  EKG personally reviewed.  Sinus rhythm with rate of 92.  No ST or T wave changes concerning for acute ischemia.  Right axis deviation.  DG Chest 2 View  Result Date: 09/02/2022 CLINICAL DATA:  Shortness of breath EXAM: CHEST - 2 VIEW COMPARISON:  08/29/2022 FINDINGS: The heart size and mediastinal contours are within normal limits. Extensive new heterogeneous and consolidative airspace opacity throughout the right midlung and right lung base. Diffuse bilateral interstitial opacity. The visualized skeletal structures are unremarkable. IMPRESSION:  1. Extensive new heterogeneous and consolidative airspace opacity throughout the right midlung and right lung base, concerning for infection or aspiration. 2. Diffuse bilateral interstitial opacity, consistent with edema or atypical/viral infection. Electronically Signed   By: Bonna Gains.D.  On: 09/02/2022 15:22    Results are pending, will review when available.  Assessment and Plan:  * Acute respiratory distress Patient is presenting with rather quick onset shortness of breath over the last 1 to 2 days in addition to 20 pound weight gain and bilateral lower extremity edema. Unclear etiology, as recent echocardiogram with no evidence of systolic or diastolic dysfunction and his BNP is interestingly within normal limits.  Chest x-ray with possible pneumonia however white blood count is within normal limits and patient is afebrile. No evidence of proteinuria on recent UA. No hx of liver disease.   - Telemetry monitoring - Continue azithromycin and ceftriaxone - Continue daily Lasix 40 mg IV - Daily weights - Strict in and out - PT/OT  Sarcoidosis Diagnosed in the 1990s and currently inactive.  Follows with pulmonology.   Essential hypertension - Continue home antihypertensives  Sleep apnea - CPAP at bedtime  Type 2 diabetes mellitus with peripheral neuropathy (HCC) - Hold home antiglycemic agents - SSI, moderate - Semglee 10 units at bedtime  Advance Care Planning:   Code Status: Full Code verified by patient. He notes that he would never want to be on long term life support however.   Consults: None  Family Communication: No family at bedside.   Severity of Illness: The appropriate patient status for this patient is OBSERVATION. Observation status is judged to be reasonable and necessary in order to provide the required intensity of service to ensure the patient's safety. The patient's presenting symptoms, physical exam findings, and initial radiographic and laboratory data  in the context of their medical condition is felt to place them at decreased risk for further clinical deterioration. Furthermore, it is anticipated that the patient will be medically stable for discharge from the hospital within 2 midnights of admission.   Author: Verdene Lennert, MD 09/02/2022 6:09 PM  For on call review [email or web address].

## 2022-09-02 NOTE — Consult Note (Signed)
PHARMACIST - PHYSICIAN COMMUNICATION  CONCERNING:  Enoxaparin (Lovenox) for DVT Prophylaxis    RECOMMENDATION: Patient was prescribed enoxaprin 40mg  q24 hours for VTE prophylaxis.   Filed Weights   09/02/22 1507  Weight: 100.7 kg (222 lb)    Body mass index is 34.77 kg/m.  Estimated Creatinine Clearance: 104.9 mL/min (by C-G formula based on SCr of 0.91 mg/dL).   Based on [facility] policy patient is candidate for enoxaparin 0.5mg /kg TBW SQ every 24 hours based on BMI being >30.  DESCRIPTION: Pharmacy has adjusted enoxaparin dose per [facility] policy.  Patient is now receiving enoxaparin 50 mg every 24 hours   Celene Squibb, PharmD Clinical Pharmacist 09/02/2022 6:02 PM

## 2022-09-03 DIAGNOSIS — R0603 Acute respiratory distress: Secondary | ICD-10-CM | POA: Diagnosis not present

## 2022-09-03 LAB — BASIC METABOLIC PANEL
Anion gap: 7 (ref 5–15)
BUN: 21 mg/dL — ABNORMAL HIGH (ref 6–20)
CO2: 26 mmol/L (ref 22–32)
Calcium: 8.4 mg/dL — ABNORMAL LOW (ref 8.9–10.3)
Chloride: 105 mmol/L (ref 98–111)
Creatinine, Ser: 0.8 mg/dL (ref 0.61–1.24)
GFR, Estimated: >60 mL/min (ref >60)
Glucose, Bld: 168 mg/dL — ABNORMAL HIGH (ref 70–99)
Potassium: 3.9 mmol/L (ref 3.5–5.1)
Sodium: 138 mmol/L (ref 135–145)

## 2022-09-03 LAB — CBC
HCT: 33.9 % — ABNORMAL LOW (ref 39.0–52.0)
Hemoglobin: 10.9 g/dL — ABNORMAL LOW (ref 13.0–17.0)
MCH: 31.2 pg (ref 26.0–34.0)
MCHC: 32.2 g/dL (ref 30.0–36.0)
MCV: 97.1 fL (ref 80.0–100.0)
Platelets: 248 10*3/uL (ref 150–400)
RBC: 3.49 MIL/uL — ABNORMAL LOW (ref 4.22–5.81)
RDW: 12.4 % (ref 11.5–15.5)
WBC: 6.3 10*3/uL (ref 4.0–10.5)
nRBC: 0 % (ref 0.0–0.2)

## 2022-09-03 LAB — CBG MONITORING, ED: Glucose-Capillary: 171 mg/dL — ABNORMAL HIGH (ref 70–99)

## 2022-09-03 LAB — HEMOGLOBIN A1C
Hgb A1c MFr Bld: 7 % — ABNORMAL HIGH (ref 4.8–5.6)
Mean Plasma Glucose: 154.2 mg/dL

## 2022-09-03 LAB — CULTURE, BLOOD (ROUTINE X 2)

## 2022-09-03 LAB — HIV ANTIBODY (ROUTINE TESTING W REFLEX): HIV Screen 4th Generation wRfx: NONREACTIVE

## 2022-09-03 MED ORDER — AMOXICILLIN-POT CLAVULANATE 875-125 MG PO TABS: 1.0000 | ORAL_TABLET | Freq: Two times a day (BID) | ORAL | 0 refills | Status: DC

## 2022-09-03 MED ORDER — AMOXICILLIN-POT CLAVULANATE 875-125 MG PO TABS
1.0000 | ORAL_TABLET | Freq: Two times a day (BID) | ORAL | Status: DC
  Administered 2022-09-03: 1 via ORAL
  Filled 2022-09-03: qty 1

## 2022-09-03 NOTE — Progress Notes (Signed)
PT Cancellation Note  Patient Details Name: Fernando Vega MRN: [id number] DOB: Oct 25, 1970   Cancelled Treatment:    Reason Eval/Treat Not Completed: PT screened, no needs identified, will sign off (Pt AMB in hallway with OT, both report pt is at baseline moiblity at thistime. Pt denies any need to see PT while admited, says he is ready to leave. WIll sign off. Thank you for this consult.)  10:21 AM, 09/03/22 Rosamaria Lints, PT, DPT Physical Therapist - [facility]  [phone] (ASCOM)    [name] C 09/03/2022, 10:21 AM

## 2022-09-03 NOTE — Evaluation (Signed)
Occupational Therapy Evaluation Patient Details Name: Fernando Vega MRN: [id number] DOB: 01/22/1971 Today's Date: 09/03/2022   History of Present Illness Fernando Vega is a 52 y/o M in ED for respiratory distress. Had shortness of breath x2 days at rest and with activity, with increased LE swelling for 2 weeks. PMH including transmet amputation, DM2, sleep apnea, HTN, sarcoidosis, chronic cough.   Clinical Impression   Patient received for OT evaluation. See flowsheet below for details of function. Generally, patient (I) for bed mobility, MOD (I) with cane for functional mobility, and (I) for ADLs. Able to don shoes in figure four at EOB; BIL UE functional; pt stating mobility feels at baseline; no overt loss of balance with cane. Patient with no further need for OT in acute care; discharge OT services.       Recommendations for follow up therapy are one component of a multi-disciplinary discharge planning process, led by the attending physician.  Recommendations may be updated based on patient status, additional functional criteria and insurance authorization.   Assistance Recommended at Discharge None  Patient can return home with the following      Functional Status Assessment  Patient has not had a recent decline in their functional status  Equipment Recommendations  None recommended by OT    Recommendations for Other Services       Precautions / Restrictions Precautions Precautions: None Restrictions Weight Bearing Restrictions: No      Mobility Bed Mobility Overal bed mobility: Independent                  Transfers Overall transfer level: Modified independent Equipment used: Straight cane                      Balance Overall balance assessment: Modified Independent                                         ADL either performed or assessed with clinical judgement   ADL Overall ADL's : Modified independent;At baseline                                        General ADL Comments: Pt able to don BIL shoes; able to make figure four. BIL UE WNL. Able to use cane functionally with no loss of balance for walking around the nurses station; pt states his walking is at baseline. O2 saturation 89-92% during mobility; increased to 95% with rest; RN and MD aware. Pt no room air; does not check O2 at home. States he checks his glucose at home.     Vision         Perception     Praxis      Pertinent Vitals/Pain Pain Assessment Pain Assessment: No/denies pain     Hand Dominance     Extremity/Trunk Assessment Upper Extremity Assessment Upper Extremity Assessment: Overall WFL for tasks assessed   Lower Extremity Assessment Lower Extremity Assessment: Overall WFL for tasks assessed       Communication Communication Communication: No difficulties   Cognition Arousal/Alertness: Awake/alert Behavior During Therapy: WFL for tasks assessed/performed Overall Cognitive Status: Within Functional Limits for tasks assessed  General Comments  Doing well and ready for d/c.    Exercises     Shoulder Instructions      Home Living Family/patient expects to be discharged to:: Private residence Living Arrangements: Spouse/significant other Available Help at Discharge: Family Type of Home: House Home Access: Stairs to enter Secretary/administrator of Steps: 3 Entrance Stairs-Rails: None Home Layout: One level     Bathroom Shower/Tub: Chief Strategy Officer: Standard     Home Equipment: Cane - single point;Wheelchair - Forensic psychologist (2 wheels);Crutches;Grab bars - tub/shower;Grab bars - toilet;Other (comment) (knee scooter)          Prior Functioning/Environment Prior Level of Function : Independent/Modified Independent;History of Falls (last six months)             Mobility Comments: Walks with cane; denies  falls besides 2 weeks ago when he fell while carrying a washer with some other people into a house for a relative. Has a wound from that. ADLs Comments: (I) ADL/IADL. Pt states he sits down in the tub for baths; uses grab bar to t/f to standing. Does laundry and cooking for the household. Wife works out of the home.        OT Problem List:        OT Treatment/Interventions:      OT Goals(Current goals can be found in the care plan section) Acute Rehab OT Goals Patient Stated Goal: Go home today OT Goal Formulation: All assessment and education complete, DC therapy  OT Frequency:      Co-evaluation              AM-PAC OT "6 Clicks" Daily Activity     Outcome Measure Help from another person eating meals?: None Help from another person taking care of personal grooming?: None Help from another person toileting, which includes using toliet, bedpan, or urinal?: None Help from another person bathing (including washing, rinsing, drying)?: None Help from another person to put on and taking off regular upper body clothing?: None Help from another person to put on and taking off regular lower body clothing?: None 6 Click Score: 24   End of Session Equipment Utilized During Treatment:  (cane) Nurse Communication: Mobility status  Activity Tolerance: Patient tolerated treatment well Patient left: in bed;with call bell/phone within reach (RN aware of pt status; pt seated at EOB)                   Time: 6578-4696 OT Time Calculation (min): 16 min Charges:  OT General Charges $OT Visit: 1 Visit OT Evaluation $OT Eval Low Complexity: 1 Low  Linward Foster, MS, OTR/L  Alvester Morin 09/03/2022, 10:52 AM

## 2022-09-03 NOTE — Discharge Summary (Signed)
Physician Discharge Summary   Patient: Fernando Vega MRN: [id number] DOB: 27-Nov-1970  Admit date:     09/02/2022  Discharge date: 09/03/22  Discharge Physician: Loyce Dys   PCP: [facility]     Discharge Diagnoses: Acute respiratory distress secondary to community-acquired pneumonia Sarcoidosis Essential hypertension Sleep apnea Type 2 diabetes mellitus with peripheral neuropathy (HCC) Bilateral lower extremity edema  Hospital Course:  Fernando Vega is a 52 y.o. male with medical history significant of sarcoidosis, severe sleep apnea, hypertension, type 2 diabetes, who presents to the ED due to shortness of breath.   [name] states that over the last 2 days, he has been experiencing worsening shortness of breathe, both at rest and with exertion. Then last night, he kept waking up in his sleep feeling as though he was choking on his own saliva. He endorses a chronic cough that is unchanged. He notes a 20 lb weight gain over the last 2 weeks with worsening lower extremity swelling. On arrival to the ED, patient was hypertensive at 157/81 with heart rate of 92.  He was saturating at 96% on room air.  He was afebrile at 98.1.  Initial workup notable for WBC of 8.0, hemoglobin 11.6, sodium 132, glucose 237, BUN 22, creatinine 0.91, and GFR above 60.  Troponin negative at 5.  BNP within normal limits at 65.  COVID-19 PCR negative. Chest x-ray was obtained that demonstrated extensive new heterogenous and consolidative airspace opacity throughout the right mid lung field and right base in addition to diffuse bilateral interstitial opacities.  Patient started on azithromycin, Rocephin, and Lasix.  TRH contacted for admission.  Subsequently admitted on observation for management of community-acquired pneumonia.  Recent echocardiogram did not show any evidence of systolic or diastolic dysfunction with normal BNP thereby making heart failure suspicion low. Bilateral lower extremity  edema patient was recommended to use TED hose stockings for possible venous stasis.  Patient also admits to having OSA for which he has been recommended to have a follow-up sleep study after his initial sleep study however he has not been able to follow-up on this he plans to do so.  Given significant improvement and the fact that patient is back to his baseline he is therefore being discharged to complete a course of antibiotic course for his pneumonia and to follow-up with primary care.   Consultants: None Procedures performed: None Disposition: Home Diet recommendation:  Discharge Diet Orders (From admission, onward)     Start     Ordered   09/03/22 0000  Diet - low sodium heart healthy        09/03/22 1027           Cardiac diet DISCHARGE MEDICATION: Allergies as of 09/03/2022   No Known Allergies      Medication List     STOP taking these medications    benzonatate 100 MG capsule Commonly known as: Tessalon Perles   CeleBREX 100 MG capsule Generic drug: celecoxib   cephALEXin 500 MG capsule Commonly known as: KEFLEX   cetirizine 10 MG tablet Commonly known as: ZyrTEC Allergy   gabapentin 300 MG capsule Commonly known as: NEURONTIN   Gvoke HypoPen 2-Pack 1 MG/0.2ML Soaj Generic drug: Glucagon   hydrOXYzine 25 MG tablet Commonly known as: ATARAX   Ozempic (0.25 or 0.5 MG/DOSE) 2 MG/3ML Sopn Generic drug: Semaglutide(0.25 or 0.5MG /DOS)   tadalafil 5 MG tablet Commonly known as: CIALIS       TAKE these medications  acetaminophen 500 MG tablet Commonly known as: TYLENOL Take 1 tablet (500 mg total) by mouth every 6 (six) hours as needed for mild pain (or Fever >/= 101).   albuterol 108 (90 Base) MCG/ACT inhaler Commonly known as: VENTOLIN HFA Inhale 2 puffs into the lungs once every 6 (six) hours as needed for wheezing or shortness of breath.   alfuzosin 10 MG 24 hr tablet Commonly known as: UROXATRAL Take 1 tablet (10 mg total) by mouth daily  with breakfast.   amLODipine 5 MG tablet Commonly known as: NORVASC Take 5 mg by mouth daily. What changed: Another medication with the same name was removed. Continue taking this medication, and follow the directions you see here.   amoxicillin-clavulanate 875-125 MG tablet Commonly known as: AUGMENTIN Take 1 tablet by mouth 2 (two) times daily.   atorvastatin 20 MG tablet Commonly known as: LIPITOR Take 20 mg by mouth daily.   Basaglar KwikPen 100 UNIT/ML Inject 15 Units into the skin at bedtime. What changed: how much to take   fluticasone 50 MCG/ACT nasal spray Commonly known as: FLONASE Place 1 spray into both nostrils 2 (two) times daily.   insulin lispro 100 UNIT/ML KwikPen Commonly known as: HumaLOG KwikPen Inject 7 Units into the skin 3 (three) times daily before meals. What changed: how much to take   Insulin Pen Needle 32G X 6 MM Misc USE AS DIRECTED WITH INSULIN PENS.   Jardiance 10 MG Tabs tablet Generic drug: empagliflozin Take 10 mg by mouth daily.   losartan 25 MG tablet Commonly known as: COZAAR Take 25 mg by mouth daily.   multivitamin with minerals Tabs tablet Take 1 tablet by mouth once daily.   Olopatadine HCl 0.2 % Soln Apply 1 drop to eye daily.   pantoprazole 40 MG tablet Commonly known as: PROTONIX Take 1 tablet (40 mg total) by mouth once daily.   sildenafil 20 MG tablet Commonly known as: REVATIO Take 3-5 tablets about one hour prior to intercourse. Do not exceed 5 tablets per dose.   vitamin C with rose hips 500 MG tablet Take 500 mg by mouth daily.        Discharge Exam: Filed Weights   09/02/22 1507  Weight: 100.7 kg   yes:     Extraocular Movements: Extraocular movements intact.  Cardiovascular:     Rate and Rhythm: Normal rate and regular rhythm.     Comments: Edema of bilateral lower extremities Pulmonary:     Effort: Pulmonary effort is normal.  Abdominal:     Palpations: Abdomen is soft.     Tenderness: There  is no abdominal tenderness.  Musculoskeletal: Transmetatarsal amputation of the right foot    General: Skin is warm and dry.  Neurological:     General: No focal deficit present.     Mental Status: He is alert and oriented to person, place, and time.  Psychiatric:        Mood and Affect: Mood normal.      Condition at discharge: good   Discharge time spent: 30 minutes  Signed: Loyce Dys, MD [facility] 09/03/2022

## 2022-09-04 LAB — CULTURE, BLOOD (ROUTINE X 2)
Culture: NO GROWTH
Culture: NO GROWTH

## 2022-09-05 LAB — CULTURE, BLOOD (ROUTINE X 2)

## 2022-09-06 LAB — CULTURE, BLOOD (ROUTINE X 2)

## 2022-09-07 LAB — CULTURE, BLOOD (ROUTINE X 2)

## 2022-09-21 ENCOUNTER — Ambulatory Visit: Payer: Medicaid Other | Admitting: Podiatry

## 2022-10-06 ENCOUNTER — Emergency Department
Admission: EM | Admit: 2022-10-06 | Discharge: 2022-10-06 | Disposition: A | Discharge status: Home / Self Care | Payer: Medicaid Other | Attending: Student in an Organized Health Care Education/Training Program | Admitting: Student in an Organized Health Care Education/Training Program

## 2022-10-06 ENCOUNTER — Emergency Department: Payer: Medicaid Other

## 2022-10-06 ENCOUNTER — Other Ambulatory Visit: Payer: Self-pay

## 2022-10-06 DIAGNOSIS — I159 Secondary hypertension, unspecified: Secondary | ICD-10-CM | POA: Diagnosis not present

## 2022-10-06 DIAGNOSIS — J019 Acute sinusitis, unspecified: Secondary | ICD-10-CM | POA: Diagnosis not present

## 2022-10-06 DIAGNOSIS — Z79899 Other long term (current) drug therapy: Secondary | ICD-10-CM | POA: Diagnosis not present

## 2022-10-06 DIAGNOSIS — J329 Chronic sinusitis, unspecified: Secondary | ICD-10-CM

## 2022-10-06 DIAGNOSIS — R519 Headache, unspecified: Secondary | ICD-10-CM

## 2022-10-06 LAB — BASIC METABOLIC PANEL
Anion gap: 11 (ref 5–15)
BUN: 19 mg/dL (ref 6–20)
CO2: 23 mmol/L (ref 22–32)
Calcium: 8.8 mg/dL — ABNORMAL LOW (ref 8.9–10.3)
Chloride: 100 mmol/L (ref 98–111)
Creatinine, Ser: 0.62 mg/dL (ref 0.61–1.24)
GFR, Estimated: >60 mL/min (ref >60)
Glucose, Bld: 216 mg/dL — ABNORMAL HIGH (ref 70–99)
Potassium: 4 mmol/L (ref 3.5–5.1)
Sodium: 134 mmol/L — ABNORMAL LOW (ref 135–145)

## 2022-10-06 LAB — CBC
HCT: 44.5 % (ref 39.0–52.0)
Hemoglobin: 15 g/dL (ref 13.0–17.0)
MCH: 30.5 pg (ref 26.0–34.0)
MCHC: 33.7 g/dL (ref 30.0–36.0)
MCV: 90.6 fL (ref 80.0–100.0)
Platelets: 217 10*3/uL (ref 150–400)
RBC: 4.91 MIL/uL (ref 4.22–5.81)
RDW: 11.7 % (ref 11.5–15.5)
WBC: 11 10*3/uL — ABNORMAL HIGH (ref 4.0–10.5)
nRBC: 0 % (ref 0.0–0.2)

## 2022-10-06 LAB — TROPONIN I (HIGH SENSITIVITY): Troponin I (High Sensitivity): 4 ng/L (ref <18)

## 2022-10-06 MED ORDER — DOXYCYCLINE HYCLATE 100 MG PO TABS: 100.0000 mg | ORAL_TABLET | Freq: Two times a day (BID) | ORAL | 0 refills | Status: AC

## 2022-10-06 MED ORDER — PROCHLORPERAZINE EDISYLATE 10 MG/2ML IJ SOLN
10.0000 mg | Freq: Once | INTRAMUSCULAR | Status: AC
  Administered 2022-10-06: 10 mg via INTRAVENOUS
  Filled 2022-10-06: qty 2

## 2022-10-06 MED ORDER — CLONIDINE HCL 0.1 MG PO TABS
0.2000 mg | ORAL_TABLET | Freq: Once | ORAL | Status: AC
  Administered 2022-10-06: 0.2 mg via ORAL
  Filled 2022-10-06: qty 2

## 2022-10-06 MED ORDER — DIPHENHYDRAMINE HCL 50 MG/ML IJ SOLN
12.5000 mg | Freq: Once | INTRAMUSCULAR | Status: AC
  Administered 2022-10-06: 12.5 mg via INTRAVENOUS
  Filled 2022-10-06: qty 1

## 2022-10-06 NOTE — ED Notes (Signed)
Patient transported to CT 

## 2022-10-06 NOTE — ED Notes (Signed)
Jes, RN received report and assessed Mr. Meyerowitz, he is AxOx4 and denies N/V. His blood pressure has come down to 141/92 from when he was triaged and admitted. He says he already feels better. Denies any pain, and only reports being cold. Pt was able to ambulate to the toilet, no falls or dizziness.

## 2022-10-06 NOTE — ED Provider Notes (Signed)
[facility] Provider Note    Event Date/Time   First MD Initiated Contact with Patient 10/06/22 [phone]     (approximate)   History   Hypertension and Headache   HPI  Fernando Vega is a 52 y.o. male who presents to the ER for evaluation of headache and some congestion and concern for elevated blood pressure.  Headache started within the past 24 hours.  No neck stiffness.  No chest pain or shortness of breath.  Noted his blood pressure was significantly elevated.  Does have a history of tension and migraine headaches.     Physical Exam   Triage Vital Signs: ED Triage Vitals  Encounter Vitals Group     BP 10/06/22 0905 (!) 173/83     Systolic BP Percentile --      Diastolic BP Percentile --      Pulse Rate 10/06/22 0905 86     Resp 10/06/22 0905 16     Temp 10/06/22 0905 97.8 F (36.6 C)     Temp Source 10/06/22 0905 Oral     SpO2 10/06/22 0905 98 %     Weight 10/06/22 0904 222 lb 0.1 oz (100.7 kg)     Height 10/06/22 0904 5\' 7"  (1.702 m)     Head Circumference --      Peak Flow --      Pain Score 10/06/22 0904 10     Pain Loc --      Pain Education --      Exclude from Growth Chart --     Most recent vital signs: Vitals:   10/06/22 1130 10/06/22 1137  BP: (!) 156/90   Pulse: 98 97  Resp:    Temp:    SpO2:  95%     Constitutional: Alert  Eyes: Conjunctivae are normal.  Head: Atraumatic. Nose: No congestion/rhinnorhea. Mouth/Throat: Mucous membranes are moist.   Neck: Painless ROM.  Cardiovascular:   Good peripheral circulation. Respiratory: Normal respiratory effort.  No retractions.  Gastrointestinal: Soft and nontender.  Musculoskeletal:  no deformity Neurologic:  MAE spontaneously. No gross focal neurologic deficits are appreciated.  Skin:  Skin is warm, dry and intact. No rash noted. Psychiatric: Mood and affect are normal. Speech and behavior are normal.    ED Results / Procedures / Treatments   Labs (all labs ordered  are listed, but only abnormal results are displayed) Labs Reviewed  BASIC METABOLIC PANEL - Abnormal; Notable for the following components:      Result Value   Sodium 134 (*)    Glucose, Bld 216 (*)    Calcium 8.8 (*)    All other components within normal limits  CBC - Abnormal; Notable for the following components:   WBC 11.0 (*)    All other components within normal limits  TROPONIN I (HIGH SENSITIVITY)     EKG  ED ECG REPORT I, Willy Eddy, the attending physician, personally viewed and interpreted this ECG.   Date: 10/06/2022  EKG Time: 9:07  Rate: 85  Rhythm: sinus  Axis: right  Intervals: normal  ST&T Change: no stemi, no depressions    RADIOLOGY Please see ED Course for my review and interpretation.  I personally reviewed all radiographic images ordered to evaluate for the above acute complaints and reviewed radiology reports and findings.  These findings were personally discussed with the patient.  Please see medical record for radiology report.    PROCEDURES:  Critical Care performed: No  Procedures  MEDICATIONS ORDERED IN ED: Medications  prochlorperazine (COMPAZINE) injection 10 mg (10 mg Intravenous Given 10/06/22 0936)  diphenhydrAMINE (BENADRYL) injection 12.5 mg (12.5 mg Intravenous Given 10/06/22 0936)  cloNIDine (CATAPRES) tablet 0.2 mg (0.2 mg Oral Given 10/06/22 0936)     IMPRESSION / MDM / ASSESSMENT AND PLAN / ED COURSE  I reviewed the triage vital signs and the nursing notes.                              Differential diagnosis includes, but is not limited to, tension migraine, SAH, IPH, SDH, sinusitis, hypertension  Patient presenting to the ER for evaluation of symptoms as described above.  Based on symptoms, risk factors and considered above differential, this presenting complaint could reflect a potentially life-threatening illness therefore the patient will be placed on continuous pulse oximetry and telemetry for monitoring.   Laboratory evaluation will be sent to evaluate for the above complaints.  Patient is not meningitic.  Given hypertension age and headache will order CT head.  Will give migraine cocktail for symptomatic management.   Clinical Course as of 10/06/22 1217  Fri Oct 06, 2022  1610 CT head on my review and interpretation without evidence of bleed or mass will await formal radiology report. [PR]  1050 Patient states headache resolved.  Will continue observe.  Still awaiting radiology report. [PR]    Clinical Course User Index [PR] Willy Eddy, MD   Patient feels significantly improved.  Does have sinusitis on CT per radiology.  Will cover her with antibiotics.  This appears stable and appropriate for outpatient follow-up.  FINAL CLINICAL IMPRESSION(S) / ED DIAGNOSES   Final diagnoses:  Secondary hypertension  Sinusitis, unspecified chronicity, unspecified location  Acute nonintractable headache, unspecified headache type     Rx / DC Orders   ED Discharge Orders          Ordered    doxycycline (VIBRA-TABS) 100 MG tablet  2 times daily        10/06/22 1215             Note:  This document was prepared using Dragon voice recognition software and may include unintentional dictation errors.    Willy Eddy, MD 10/06/22 [phone]

## 2022-10-06 NOTE — ED Triage Notes (Signed)
Pt here with htn and a headache since this morning. Pt states he forgot to take his medicine last night and woke up with a headache this morning, pt took his morning dose of medication. Pt states severe headache pain. Pt uses a cane at baseline and is ambulatory.

## 2023-01-15 ENCOUNTER — Other Ambulatory Visit: Payer: Self-pay

## 2023-01-15 ENCOUNTER — Telehealth: Payer: Self-pay

## 2023-01-15 DIAGNOSIS — Z860100 Personal history of colon polyps, unspecified: Secondary | ICD-10-CM

## 2023-01-15 MED ORDER — GOLYTELY 236 G PO SOLR: 4000.0000 mL | Freq: Every day | ORAL | 0 refills | Status: AC

## 2023-01-15 NOTE — Telephone Encounter (Signed)
Gastroenterology Pre-Procedure Review  Request Date: 02/07/23 Requesting Physician: Dr. Tobi Bastos  PATIENT REVIEW QUESTIONS: The patient responded to the following health history questions as indicated:    1. Are you having any GI issues? no 2. Do you have a personal history of Polyps? yes (last colonoscopy was performed by Dr. Tobi Bastos 11/23/21 polyps were noted.  Dr. Tobi Bastos recommended repeat in 1 year with a 2 day prep due to large amount of stool in the colon ) 3. Do you have a family history of Colon Cancer or Polyps? no 4. Diabetes Mellitus? yes (patient has been advised to stop jardiance 3 days prior to colonoscopy and on the evening before colonoscopy take half usual amount of insulin) 5. Joint replacements in the past 12 months?no 6. Major health problems in the past 3 months?no 7. Any artificial heart valves, MVP, or defibrillator?no    MEDICATIONS & ALLERGIES:    Patient reports the following regarding taking any anticoagulation/antiplatelet therapy:   Plavix, Coumadin, Eliquis, Xarelto, Lovenox, Pradaxa, Brilinta, or Effient? no Aspirin? no  Patient confirms/reports the following medications:  Current Outpatient Medications  Medication Sig Dispense Refill   acetaminophen (TYLENOL) 500 MG tablet Take 1 tablet (500 mg total) by mouth every 6 (six) hours as needed for mild pain (or Fever >/= 101). 30 tablet 0   albuterol (VENTOLIN HFA) 108 (90 Base) MCG/ACT inhaler Inhale 2 puffs into the lungs once every 6 (six) hours as needed for wheezing or shortness of breath. 6.7 g 0   alfuzosin (UROXATRAL) 10 MG 24 hr tablet Take 1 tablet (10 mg total) by mouth daily with breakfast. 30 tablet 11   amLODipine (NORVASC) 5 MG tablet Take 5 mg by mouth daily.     Ascorbic Acid (VITAMIN C WITH ROSE HIPS) 500 MG tablet Take 500 mg by mouth daily.     atorvastatin (LIPITOR) 20 MG tablet Take 20 mg by mouth daily.     fluticasone (FLONASE) 50 MCG/ACT nasal spray Place 1 spray into both nostrils 2 (two)  times daily.     Insulin Glargine (BASAGLAR KWIKPEN) 100 UNIT/ML Inject 15 Units into the skin at bedtime. (Patient taking differently: Inject 30 Units into the skin at bedtime.) 15 mL 3   insulin lispro (HUMALOG KWIKPEN) 100 UNIT/ML KwikPen Inject 7 Units into the skin 3 (three) times daily before meals. (Patient taking differently: Inject 12 Units into the skin 3 (three) times daily before meals.) 15 mL 3   Insulin Pen Needle 32G X 6 MM MISC USE AS DIRECTED WITH INSULIN PENS. 100 each 1   JARDIANCE 10 MG TABS tablet Take 10 mg by mouth daily.     losartan (COZAAR) 25 MG tablet Take 25 mg by mouth daily.     Multiple Vitamin (MULTIVITAMIN WITH MINERALS) TABS tablet Take 1 tablet by mouth once daily. 30 tablet 0   Olopatadine HCl 0.2 % SOLN Apply 1 drop to eye daily.     pantoprazole (PROTONIX) 40 MG tablet Take 1 tablet (40 mg total) by mouth once daily. 30 tablet 2   sildenafil (REVATIO) 20 MG tablet Take 3-5 tablets about one hour prior to intercourse. Do not exceed 5 tablets per dose. 30 tablet 0   No current facility-administered medications for this visit.    Patient confirms/reports the following allergies:  Allergies  Allergen Reactions   Gramineae Pollens Other (See Comments)    Sinus    No orders of the defined types were placed in this encounter.   AUTHORIZATION INFORMATION  Primary Insurance: 1D#: Group #:  Secondary Insurance: 1D#: Group #:  SCHEDULE INFORMATION: Date: 02/07/23 Time: Location: [facility]

## 2023-01-30 ENCOUNTER — Ambulatory Visit (INDEPENDENT_AMBULATORY_CARE_PROVIDER_SITE_OTHER): Payer: Medicaid Other | Admitting: Podiatry

## 2023-01-30 DIAGNOSIS — M79675 Pain in left toe(s): Secondary | ICD-10-CM

## 2023-01-30 DIAGNOSIS — B351 Tinea unguium: Secondary | ICD-10-CM | POA: Diagnosis not present

## 2023-01-30 DIAGNOSIS — E119 Type 2 diabetes mellitus without complications: Secondary | ICD-10-CM | POA: Diagnosis not present

## 2023-01-30 NOTE — Progress Notes (Signed)
   No chief complaint on file.   SUBJECTIVE Patient with a history of diabetes mellitus, Lisfranc amputation right foot. DOS: 01/08/2021,  presents to office today complaining of elongated, thickened nails that cause pain while ambulating in shoes.  Patient is unable to trim their own nails. Patient is here for further evaluation and treatment.  Past Medical History:  Diagnosis Date   Diabetes mellitus without complication (HCC)    Hypertension    Neuropathy    Sarcoidosis    Sleep apnea     Allergies  Allergen Reactions   Gramineae Pollens Other (See Comments)    Sinus     OBJECTIVE General Patient is awake, alert, and oriented x 3 and in no acute distress. Derm Skin is dry and supple bilateral. Negative open lesions or macerations. Remaining integument unremarkable. Nails are tender, long, thickened and dystrophic with subungual debris, consistent with onychomycosis, 1-5 left. No signs of infection noted. Vasc capillary refill WNL left foot.. Temperature gradient within normal limits.  Neuro light touch and protective threshold sensation diminished bilaterally.  Musculoskeletal Exam history of Lisfranc amputation right foot. DOS: 01/08/2021  ASSESSMENT 1. Diabetes Mellitus w/ peripheral neuropathy 2.  Pain due to onychomycosis of toenails left 3.  H/o Lisfranc amputation right foot. DOS: 01/08/2021  PLAN OF CARE 1. Patient evaluated today.  Comprehensive diabetic foot exam performed today 2. Instructed to maintain good pedal hygiene and foot care. Stressed importance of controlling blood sugar.  3. Mechanical debridement of nails 1-5 left performed using a nail nipper. Filed with dremel without incident.  4. Return to clinic in 3 mos. for routine diabetic footcare    Felecia Shelling, DPM [facility]  Dr. Felecia Shelling, DPM    [address] [place], [place] [address]                Office [phone]  Fax [phone]

## 2023-02-06 ENCOUNTER — Telehealth: Payer: Self-pay | Admitting: Gastroenterology

## 2023-02-06 NOTE — Telephone Encounter (Signed)
Patient called in to get the time for the procedure.

## 2023-02-07 ENCOUNTER — Encounter
Admission: RE | Disposition: A | Discharge status: Home / Self Care | Payer: Self-pay | Source: Home / Self Care | Attending: Gastroenterology

## 2023-02-07 ENCOUNTER — Other Ambulatory Visit: Payer: Self-pay

## 2023-02-07 ENCOUNTER — Ambulatory Visit: Payer: Medicaid Other | Admitting: Anesthesiology

## 2023-02-07 ENCOUNTER — Encounter: Payer: Self-pay | Admitting: Gastroenterology

## 2023-02-07 ENCOUNTER — Ambulatory Visit
Admission: RE | Admit: 2023-02-07 | Discharge: 2023-02-07 | Disposition: A | Discharge status: Home / Self Care | Payer: Medicaid Other | Attending: Gastroenterology | Admitting: Gastroenterology

## 2023-02-07 DIAGNOSIS — Z538 Procedure and treatment not carried out for other reasons: Secondary | ICD-10-CM | POA: Diagnosis not present

## 2023-02-07 DIAGNOSIS — G4730 Sleep apnea, unspecified: Secondary | ICD-10-CM | POA: Insufficient documentation

## 2023-02-07 DIAGNOSIS — I10 Essential (primary) hypertension: Secondary | ICD-10-CM | POA: Insufficient documentation

## 2023-02-07 DIAGNOSIS — Z79899 Other long term (current) drug therapy: Secondary | ICD-10-CM | POA: Diagnosis not present

## 2023-02-07 DIAGNOSIS — E109 Type 1 diabetes mellitus without complications: Secondary | ICD-10-CM | POA: Diagnosis not present

## 2023-02-07 DIAGNOSIS — Z833 Family history of diabetes mellitus: Secondary | ICD-10-CM | POA: Insufficient documentation

## 2023-02-07 DIAGNOSIS — Z860100 Personal history of colon polyps, unspecified: Secondary | ICD-10-CM

## 2023-02-07 DIAGNOSIS — Z09 Encounter for follow-up examination after completed treatment for conditions other than malignant neoplasm: Secondary | ICD-10-CM | POA: Insufficient documentation

## 2023-02-07 DIAGNOSIS — D649 Anemia, unspecified: Secondary | ICD-10-CM | POA: Insufficient documentation

## 2023-02-07 DIAGNOSIS — Z6830 Body mass index (BMI) 30.0-30.9, adult: Secondary | ICD-10-CM | POA: Diagnosis not present

## 2023-02-07 DIAGNOSIS — Z794 Long term (current) use of insulin: Secondary | ICD-10-CM | POA: Diagnosis not present

## 2023-02-07 DIAGNOSIS — Z1211 Encounter for screening for malignant neoplasm of colon: Secondary | ICD-10-CM | POA: Diagnosis not present

## 2023-02-07 DIAGNOSIS — Z860101 Personal history of adenomatous and serrated colon polyps: Secondary | ICD-10-CM | POA: Insufficient documentation

## 2023-02-07 DIAGNOSIS — E669 Obesity, unspecified: Secondary | ICD-10-CM | POA: Insufficient documentation

## 2023-02-07 HISTORY — PX: COLONOSCOPY WITH PROPOFOL: SHX5780

## 2023-02-07 LAB — GLUCOSE, CAPILLARY: Glucose-Capillary: 187 mg/dL — ABNORMAL HIGH (ref 70–99)

## 2023-02-07 SURGERY — COLONOSCOPY WITH PROPOFOL
Anesthesia: General

## 2023-02-07 MED ORDER — PROPOFOL 500 MG/50ML IV EMUL
INTRAVENOUS | Status: DC | PRN
  Administered 2023-02-07: 80 mg via INTRAVENOUS
  Administered 2023-02-07: 150 ug/kg/min via INTRAVENOUS

## 2023-02-07 MED ORDER — PROPOFOL 1000 MG/100ML IV EMUL
INTRAVENOUS | Status: AC
  Filled 2023-02-07: qty 100

## 2023-02-07 MED ORDER — LIDOCAINE HCL (PF) 2 % IJ SOLN
INTRAMUSCULAR | Status: AC
  Filled 2023-02-07: qty 5

## 2023-02-07 MED ORDER — SODIUM CHLORIDE 0.9 % IV SOLN
INTRAVENOUS | Status: DC
Start: 2023-02-07 — End: 2023-02-07

## 2023-02-07 NOTE — Anesthesia Preprocedure Evaluation (Signed)
Anesthesia Evaluation  Patient identified by MRN, date of birth, ID band Patient awake    Reviewed: Allergy & Precautions, NPO status , Patient's Chart, lab work & pertinent test results  Airway Mallampati: II  TM Distance: >3 FB     Dental  (+) Edentulous Upper, Edentulous Lower   Pulmonary sleep apnea    Pulmonary exam normal        Cardiovascular Exercise Tolerance: Good hypertension, Pt. on medications  Rhythm:Regular Rate:Normal     Neuro/Psych negative neurological ROS  negative psych ROS   GI/Hepatic Neg liver ROS,,,  Endo/Other  diabetes, Type 1    Renal/GU negative Renal ROS     Musculoskeletal   Abdominal  (+) + obese  Peds negative pediatric ROS (+)  Hematology  (+) Blood dyscrasia, anemia   Anesthesia Other Findings   Reproductive/Obstetrics                             Anesthesia Physical Anesthesia Plan  ASA: 3  Anesthesia Plan: General   Post-op Pain Management:    Induction: Intravenous  PONV Risk Score and Plan:   Airway Management Planned: Natural Airway and Nasal Cannula  Additional Equipment:   Intra-op Plan:   Post-operative Plan:   Informed Consent: I have reviewed the patients History and Physical, chart, labs and discussed the procedure including the risks, benefits and alternatives for the proposed anesthesia with the patient or authorized representative who has indicated his/her understanding and acceptance.       Plan Discussed with:   Anesthesia Plan Comments:        Anesthesia Quick Evaluation

## 2023-02-07 NOTE — Op Note (Signed)
[facility] Patient Name: Fernando Vega Procedure Date: 02/07/2023 6:51 AM MRN: [id number] Account #: [id number] Date of Birth: 05-Aug-1970 Admit Type: Outpatient Age: 52 Room: [facility] ENDO ROOM 3 Gender: Male Note Status: Finalized Instrument Name: Nelda Marseille [id number] Procedure:             Colonoscopy Indications:           Surveillance: History of piecemeal removal adenoma on                         last colonoscopy (< 3 yrs), Last colonoscopy:                         September 2023 Providers:             Wyline Mood MD, MD Referring MD:          No Local Md, MD (Referring MD) Medicines:             Monitored Anesthesia Care Complications:         No immediate complications. Procedure:             Pre-Anesthesia Assessment:                        - Prior to the procedure, a History and Physical was                         performed, and patient medications, allergies and                         sensitivities were reviewed. The patient's tolerance                         of previous anesthesia was reviewed.                        - The risks and benefits of the procedure and the                         sedation options and risks were discussed with the                         patient. All questions were answered and informed                         consent was obtained.                        - After reviewing the risks and benefits, the patient                         was deemed in satisfactory condition to undergo the                         procedure.                        - ASA Grade Assessment: II - A patient with mild                         systemic  disease.                        After obtaining informed consent, the colonoscope was                         passed under direct vision. Throughout the procedure,                         the patient's blood pressure, pulse, and oxygen                         saturations were monitored  continuously. The                         Colonoscope was introduced through the anus with the                         intention of advancing to the cecum. The scope was                         advanced to the transverse colon before the procedure                         was aborted. Medications were given. The colonoscopy                         was performed with ease. The patient tolerated the                         procedure well. The quality of the bowel preparation                         was inadequate. Findings:      The perianal and digital rectal examinations were normal.      A large amount of semi-liquid stool was found in the entire colon,       interfering with visualization. Impression:            - Preparation of the colon was inadequate.                        - Stool in the entire examined colon.                        - No specimens collected. Recommendation:        - Discharge patient to home (with escort).                        - Resume previous diet.                        - Continue present medications.                        - Repeat colonoscopy in 1 month because the bowel                         preparation was suboptimal. Procedure Code(s):     --- Professional ---  40981, 53, Colonoscopy, flexible; diagnostic,                         including collection of specimen(s) by brushing or                         washing, when performed (separate procedure) CPT copyright 2022 [employer]. All rights reserved. The codes documented in this report are preliminary and upon coder review may  be revised to meet current compliance requirements. Wyline Mood, MD Wyline Mood MD, MD 02/07/2023 7:52:19 AM This report has been signed electronically. Number of Addenda: 0 Note Initiated On: 02/07/2023 6:51 AM Total Procedure Duration: 0 hours 1 minute 42 seconds  Estimated Blood Loss:  Estimated blood loss: none.      [facility]

## 2023-02-07 NOTE — H&P (Signed)
Wyline Mood, MD [address], [address], [place], [place], [address] [address], [address], [place], [place], [address] Phone: [phone]  Fax: [phone]  Primary Care Physician:  Jodi Marble, NP   Pre-Procedure History & Physical: HPI:  Fernando Vega is a 52 y.o. male is here for an colonoscopy.   Past Medical History:  Diagnosis Date   Diabetes mellitus without complication (HCC)    Hypertension    Neuropathy    Sarcoidosis    Sleep apnea     Past Surgical History:  Procedure Laterality Date   AMPUTATION Right 12/13/2020   Procedure: AMPUTATION midFOOT-RIGHT;  Surgeon: Edwin Cap, DPM;  Location: [facility];  Service: Podiatry;  Laterality: Right;   AMPUTATION Right 11/05/2020   Procedure: AMPUTATION RAY-Partial Ray Amp 2nd & 3rd Digit;  Surgeon: Felecia Shelling, DPM;  Location: [facility];  Service: Podiatry;  Laterality: Right;   APPLICATION OF WOUND VAC Right 11/09/2020   Procedure: APPLICATION OF WOUND VAC;  Surgeon: Felecia Shelling, DPM;  Location: [facility];  Service: Podiatry;  Laterality: Right;   BONE BIOPSY Right 01/08/2021   Procedure: BONE BIOPSY;  Surgeon: Vivi Barrack, DPM;  Location: [facility];  Service: Podiatry;  Laterality: Right;   COLONOSCOPY WITH PROPOFOL N/A 07/19/2021   Procedure: COLONOSCOPY WITH PROPOFOL;  Surgeon: Wyline Mood, MD;  Location: [facility];  Service: Gastroenterology;  Laterality: N/A;   COLONOSCOPY WITH PROPOFOL N/A 11/23/2021   Procedure: COLONOSCOPY WITH PROPOFOL;  Surgeon: Wyline Mood, MD;  Location: [facility];  Service: Gastroenterology;  Laterality: N/A;   INCISION AND DRAINAGE Right 11/01/2020   Procedure: INCISION AND DRAINAGE-Right Foot;  Surgeon: Edwin Cap, DPM;  Location: [facility];  Service: Podiatry;  Laterality: Right;   IRRIGATION AND DEBRIDEMENT FOOT Right 11/09/2020   Procedure: IRRIGATION AND DEBRIDEMENT FOOT;  Surgeon: Felecia Shelling, DPM;  Location: [facility];  Service: Podiatry;  Laterality:  Right;   IRRIGATION AND DEBRIDEMENT FOOT Right 12/13/2020   Procedure: IRRIGATION AND DEBRIDEMENT FOOT;  Surgeon: Edwin Cap, DPM;  Location: [facility];  Service: Podiatry;  Laterality: Right;   TRANSMETATARSAL AMPUTATION Right 01/08/2021   Procedure: LISFRANC  AMPUTATION;  Surgeon: Vivi Barrack, DPM;  Location: [facility];  Service: Podiatry;  Laterality: Right;   WOUND DEBRIDEMENT Right 12/16/2020   Procedure: DEBRIDEMENT WOUND;  Surgeon: Candelaria Stagers, DPM;  Location: [facility];  Service: Podiatry;  Laterality: Right;  Transmet amputation washout & closure   WOUND DEBRIDEMENT Right 01/08/2021   Procedure: DEBRIDEMENT WOUND;  Surgeon: Vivi Barrack, DPM;  Location: [facility];  Service: Podiatry;  Laterality: Right;    Prior to Admission medications   Medication Sig Start Date End Date Taking? Authorizing Provider  acetaminophen (TYLENOL) 500 MG tablet Take 1 tablet (500 mg total) by mouth every 6 (six) hours as needed for mild pain (or Fever >/= 101). 12/19/20   Lonia Blood, MD  albuterol (VENTOLIN HFA) 108 (90 Base) MCG/ACT inhaler Inhale 2 puffs into the lungs once every 6 (six) hours as needed for wheezing or shortness of breath. 01/27/21   Iloabachie, Chioma E, NP  alfuzosin (UROXATRAL) 10 MG 24 hr tablet Take 1 tablet (10 mg total) by mouth daily with breakfast. 01/20/22   Vaillancourt, Lelon Mast, PA-C  amLODipine (NORVASC) 5 MG tablet Take 5 mg by mouth daily. 08/08/22   [provider]  Ascorbic Acid (VITAMIN C WITH ROSE HIPS) 500 MG tablet Take 500 mg by mouth daily.  [provider]  atorvastatin (LIPITOR) 20 MG tablet Take 20 mg by mouth daily. 05/27/21   [provider]  fluticasone (FLONASE) 50 MCG/ACT nasal spray Place 1 spray into both nostrils 2 (two) times daily.    [provider]  Insulin Glargine (BASAGLAR KWIKPEN) 100 UNIT/ML Inject 15 Units into the skin at bedtime. Patient taking differently: Inject 30 Units into the  skin at bedtime. 12/30/20   Iloabachie, Chioma E, NP  insulin lispro (HUMALOG KWIKPEN) 100 UNIT/ML KwikPen Inject 7 Units into the skin 3 (three) times daily before meals. Patient taking differently: Inject 12 Units into the skin 3 (three) times daily before meals. 01/27/21   Iloabachie, Chioma E, NP  Insulin Pen Needle 32G X 6 MM MISC USE AS DIRECTED WITH INSULIN PENS. 12/30/20   Iloabachie, Chioma E, NP  JARDIANCE 10 MG TABS tablet Take 10 mg by mouth daily. 08/15/22   [provider]  losartan (COZAAR) 25 MG tablet Take 25 mg by mouth daily. 12/05/21   [provider]  Multiple Vitamin (MULTIVITAMIN WITH MINERALS) TABS tablet Take 1 tablet by mouth once daily. 11/13/20   Alford Highland, MD  Olopatadine HCl 0.2 % SOLN Apply 1 drop to eye daily. 07/10/22   [provider]  pantoprazole (PROTONIX) 40 MG tablet Take 1 tablet (40 mg total) by mouth once daily. 12/30/20   Iloabachie, Chioma E, NP  sildenafil (REVATIO) 20 MG tablet Take 3-5 tablets about one hour prior to intercourse. Do not exceed 5 tablets per dose. 03/31/22   Carman Ching, PA-C    Allergies as of 01/15/2023 - Review Complete 10/06/2022  Allergen Reaction Noted   Gramineae pollens Other (See Comments) 07/03/2022    Family History  Problem Relation Age of Onset   Hypertension Mother    Diabetes type II Father    Parkinson's disease Father    Heart disease Father     Social History   Socioeconomic History   Marital status: Married    Spouse name: Not on file   Number of children: Not on file   Years of education: Not on file   Highest education level: Not on file  Occupational History   Not on file  Tobacco Use   Smoking status: Never    Passive exposure: Never   Smokeless tobacco: Never  Vaping Use   Vaping status: Never Used  Substance and Sexual Activity   Alcohol use: No   Drug use: No   Sexual activity: Not on file  Other Topics Concern   Not on file  Social History  Narrative   Not on file   Social Determinants of Health   Financial Resource Strain: Not on file  Food Insecurity: No Food Insecurity (01/27/2021)   Hunger Vital Sign    Worried About Running Out of Food in the Last Year: Never true    Ran Out of Food in the Last Year: Never true  Transportation Needs: No Transportation Needs (01/27/2021)   PRAPARE - Administrator, Civil Service (Medical): No    Lack of Transportation (Non-Medical): No  Physical Activity: Not on file  Stress: Not on file  Social Connections: Not on file  Intimate Partner Violence: Not on file    Review of Systems: See HPI, otherwise negative ROS  Physical Exam: BP (!) 178/92   Pulse 88   Temp (!) 96.6 F (35.9 C) (Temporal)   Resp 16   Ht 5\' 7"  (1.702 m)   Wt 88  kg   SpO2 96%   BMI 30.38 kg/m  General:   Alert,  pleasant and cooperative in NAD Head:  Normocephalic and atraumatic. Neck:  Supple; no masses or thyromegaly. Lungs:  Clear throughout to auscultation, normal respiratory effort.    Heart:  +S1, +S2, Regular rate and rhythm, No edema. Abdomen:  Soft, nontender and nondistended. Normal bowel sounds, without guarding, and without rebound.   Neurologic:  Alert and  oriented x4;  grossly normal neurologically.  Impression/Plan: Fernando Vega is here for an colonoscopy to be performed for surveillance due to prior history of colon polyps   Risks, benefits, limitations, and alternatives regarding  colonoscopy have been reviewed with the patient.  Questions have been answered.  All parties agreeable.   Wyline Mood, MD  02/07/2023, 7:43 AM

## 2023-02-07 NOTE — Transfer of Care (Signed)
Immediate Anesthesia Transfer of Care Note  Patient: Fernando Vega  Procedure(s) Performed: COLONOSCOPY WITH PROPOFOL  Patient Location: PACU  Anesthesia Type:General  Level of Consciousness: drowsy  Airway & Oxygen Therapy: Patient Spontanous Breathing  Post-op Assessment: Report given to RN and Post -op Vital signs reviewed and stable  Post vital signs: Reviewed and stable  Last Vitals:  Vitals Value Taken Time  BP 157/82 02/07/23 0755  Temp    Pulse 78 02/07/23 0755  Resp 13 02/07/23 0755  SpO2 98 % 02/07/23 0755  Vitals shown include unfiled device data.  Last Pain:  Vitals:   02/07/23 0713  TempSrc: Temporal  PainSc: 0-No pain         Complications: There were no known notable events for this encounter.

## 2023-02-12 ENCOUNTER — Encounter: Payer: Self-pay | Admitting: Gastroenterology

## 2023-02-16 NOTE — Anesthesia Postprocedure Evaluation (Signed)
Anesthesia Post Note  Patient: Fernando Vega  Procedure(s) Performed: COLONOSCOPY WITH PROPOFOL  Patient location during evaluation: PACU Anesthesia Type: General Level of consciousness: sedated and awake Pain management: pain level controlled Vital Signs Assessment: post-procedure vital signs reviewed and stable Respiratory status: spontaneous breathing Cardiovascular status: blood pressure returned to baseline Anesthetic complications: no   There were no known notable events for this encounter.   Last Vitals:  Vitals:   02/07/23 0805 02/07/23 0815  BP: (!) 166/91   Pulse:    Resp:    Temp:    SpO2:  99%    Last Pain:  Vitals:   02/09/23 1159  TempSrc:   PainSc: 0-No pain                 VAN STAVEREN,Cambell Rickenbach

## 2023-03-08 ENCOUNTER — Telehealth: Payer: Self-pay

## 2023-03-08 NOTE — Telephone Encounter (Signed)
Entering colonoscopy results-per Dr.Anna needs to repeat colonoscopy due to bowel preparationwas suboptimal- spoke with patient and he adamantly refused stating he did 2 day prep and he was pooping clear prior to the procedure.

## 2023-05-03 ENCOUNTER — Ambulatory Visit: Payer: Medicare Other | Admitting: Podiatry

## 2023-05-03 ENCOUNTER — Ambulatory Visit (INDEPENDENT_AMBULATORY_CARE_PROVIDER_SITE_OTHER): Payer: Medicare Other | Admitting: Podiatry

## 2023-05-03 ENCOUNTER — Encounter: Payer: Self-pay | Admitting: Podiatry

## 2023-05-03 DIAGNOSIS — E1142 Type 2 diabetes mellitus with diabetic polyneuropathy: Secondary | ICD-10-CM | POA: Diagnosis not present

## 2023-05-03 DIAGNOSIS — Z89431 Acquired absence of right foot: Secondary | ICD-10-CM

## 2023-05-03 DIAGNOSIS — B351 Tinea unguium: Secondary | ICD-10-CM

## 2023-05-03 DIAGNOSIS — M79675 Pain in left toe(s): Secondary | ICD-10-CM

## 2023-05-03 NOTE — Progress Notes (Signed)
  Subjective:  Patient ID: Fernando Vega, male    DOB: 1970-07-19,  MRN: [id number]  53 y.o. male presents to clinic with  at risk foot care. Patient has h/o Lisfranc's amputation of RLE and is seen for painful thick toenails that are difficult to trim. Pain interferes with ambulation. Aggravating factors include wearing enclosed shoe gear. Pain is relieved with periodic professional debridement.  Chief Complaint  Patient presents with   Diabetes    "Get my toenails cut."  PCP Carmela Hurt) - lov right before Christmas; A1c - 9     New problem(s): None   PCP is Fernando Marble, NP.  Allergies  Allergen Reactions   Gramineae Pollens Other (See Comments)    Sinus    Review of Systems: Negative except as noted in the HPI.   Objective:  DINNIS ROG is a pleasant 53 y.o. male in NAD. AAO x 3.  Vascular Examination: Vascular status intact LLE with palpable pulses.  CFT immediate LLE. No edema. No pain with calf compression LLE. Skin temperature gradient WNL LLE. Pedal hair sparse. No varicosities noted. No ischemia or gangrene noted b/l LE. No cyanosis or clubbing noted LLE.  Neurological Examination: Protective sensation diminished with 10g monofilament b/l.  Dermatological Examination: Pedal skin with normal turgor and tone b/l. Skin mildly dry. Toenails 1-5 left foot thick, discolored, elongated with subungual debris and pain on dorsal palpation. No hyperkeratotic lesions noted b/l.   Musculoskeletal Examination: Muscle strength 5/5 to b/l LE. Lower extremity amputation(s): Lisfranc's amputation RLE. Ambulates with cane assistance.  Radiographs: None  Last A1c:      Latest Ref Rng & Units 09/03/2022    5:57 AM  Hemoglobin A1C  Hemoglobin-A1c 4.8 - 5.6 % 7.0    Assessment:   1. Pain due to onychomycosis of toenail of left foot   2. History of Lisfranc amputation of right foot (HCC)   3. Diabetic peripheral neuropathy associated with type 2 diabetes mellitus (HCC)     Plan:  -Patient was evaluated today. All questions/concerns addressed on today's visit. -Continue foot and shoe inspections daily. Monitor blood glucose per PCP/Endocrinologist's recommendations. -Patient to continue soft, supportive shoe gear daily. -Toenails were debrided in length and girth 1-5 left foot with sterile nail nippers and dremel without iatrogenic bleeding.  -For dry skin, recommended OTC moisturizers. Patient/POA instructed to apply to foot/feet once daily avoiding application between toes.  -Patient/POA to call should there be question/concern in the interim.  Return in about 3 months (around 07/31/2023).  [name], DPM      [place] LOCATION: [address] [place] [address]                   Office [phone]   [facility] LOCATION: [address] [place], [place] [address] Office [phone]

## 2023-08-01 ENCOUNTER — Encounter: Payer: Self-pay | Admitting: Emergency Medicine

## 2023-08-01 ENCOUNTER — Emergency Department
Admission: EM | Admit: 2023-08-01 | Discharge: 2023-08-01 | Disposition: A | Discharge status: Home / Self Care | Attending: Emergency Medicine | Admitting: Emergency Medicine

## 2023-08-01 ENCOUNTER — Other Ambulatory Visit: Payer: Self-pay

## 2023-08-01 DIAGNOSIS — E878 Other disorders of electrolyte and fluid balance, not elsewhere classified: Secondary | ICD-10-CM | POA: Diagnosis not present

## 2023-08-01 DIAGNOSIS — R739 Hyperglycemia, unspecified: Secondary | ICD-10-CM

## 2023-08-01 DIAGNOSIS — R944 Abnormal results of kidney function studies: Secondary | ICD-10-CM | POA: Diagnosis not present

## 2023-08-01 DIAGNOSIS — M79671 Pain in right foot: Secondary | ICD-10-CM | POA: Insufficient documentation

## 2023-08-01 DIAGNOSIS — D72829 Elevated white blood cell count, unspecified: Secondary | ICD-10-CM | POA: Diagnosis not present

## 2023-08-01 DIAGNOSIS — E1165 Type 2 diabetes mellitus with hyperglycemia: Secondary | ICD-10-CM | POA: Insufficient documentation

## 2023-08-01 DIAGNOSIS — E871 Hypo-osmolality and hyponatremia: Secondary | ICD-10-CM | POA: Insufficient documentation

## 2023-08-01 DIAGNOSIS — I10 Essential (primary) hypertension: Secondary | ICD-10-CM | POA: Insufficient documentation

## 2023-08-01 LAB — CBC
HCT: 46.1 % (ref 39.0–52.0)
Hemoglobin: 15.6 g/dL (ref 13.0–17.0)
MCH: 31 pg (ref 26.0–34.0)
MCHC: 33.8 g/dL (ref 30.0–36.0)
MCV: 91.7 fL (ref 80.0–100.0)
Platelets: 277 10*3/uL (ref 150–400)
RBC: 5.03 MIL/uL (ref 4.22–5.81)
RDW: 12.3 % (ref 11.5–15.5)
WBC: 12.7 10*3/uL — ABNORMAL HIGH (ref 4.0–10.5)
nRBC: 0 % (ref 0.0–0.2)

## 2023-08-01 LAB — URINALYSIS, ROUTINE W REFLEX MICROSCOPIC
Bilirubin Urine: NEGATIVE
Glucose, UA: >=500 mg/dL — AB
Ketones, ur: NEGATIVE mg/dL
Leukocytes,Ua: NEGATIVE
Nitrite: NEGATIVE
Protein, ur: NEGATIVE mg/dL
Specific Gravity, Urine: 1.025 (ref 1.005–1.030)
Squamous Epithelial / HPF: 0 /HPF (ref 0–5)
pH: 5 (ref 5.0–8.0)

## 2023-08-01 LAB — BASIC METABOLIC PANEL WITH GFR
Anion gap: 12 (ref 5–15)
BUN: 23 mg/dL — ABNORMAL HIGH (ref 6–20)
CO2: 21 mmol/L — ABNORMAL LOW (ref 22–32)
Calcium: 8.3 mg/dL — ABNORMAL LOW (ref 8.9–10.3)
Chloride: 96 mmol/L — ABNORMAL LOW (ref 98–111)
Creatinine, Ser: 1.13 mg/dL (ref 0.61–1.24)
GFR, Estimated: >60 mL/min (ref >60)
Glucose, Bld: 473 mg/dL — ABNORMAL HIGH (ref 70–99)
Potassium: 4.7 mmol/L (ref 3.5–5.1)
Sodium: 129 mmol/L — ABNORMAL LOW (ref 135–145)

## 2023-08-01 LAB — CBG MONITORING, ED
Glucose-Capillary: 457 mg/dL — ABNORMAL HIGH (ref 70–99)
Glucose-Capillary: 208 mg/dL — ABNORMAL HIGH (ref 70–99)

## 2023-08-01 LAB — LACTIC ACID, PLASMA: Lactic Acid, Venous: 1.8 mmol/L (ref 0.5–1.9)

## 2023-08-01 MED ORDER — SODIUM CHLORIDE 0.9 % IV BOLUS
1000.0000 mL | Freq: Once | INTRAVENOUS | Status: AC
  Administered 2023-08-01: 1000 mL via INTRAVENOUS

## 2023-08-01 NOTE — Discharge Instructions (Signed)
 Please follow up with podiatry as scheduled tomorrow. Return to the ER for symptoms of concern if unable to schedule an appointment with primary care or the specialist.

## 2023-08-01 NOTE — ED Notes (Signed)
 Called CCMD for central monitoring.   Blood cultures: First set from left medial FA Second from left lateral FA

## 2023-08-01 NOTE — ED Triage Notes (Signed)
 Patient to ED via POV for hypotension and hyperglycemia. PT reports being sent over for BP in the 60's and cbg reading HI. Pt states pain in right foot. Ambulatory to triage.  Cbg 457 in triage

## 2023-08-01 NOTE — ED Provider Notes (Signed)
 [facility] Provider Note    Event Date/Time   First MD Initiated Contact with Patient 08/01/23 1250     (approximate)   History   Hyperglycemia and Hypotension   HPI  Fernando Vega is a 53 y.o. male with history of type 2 diabetes, anemia, hypertension, neuropathy and as listed in EMR presents to the emergency department for treatment and evaluation of hypotension and hyperglycemia.  He was evaluated at primary care and found to be hypotensive with blood pressures in the 60s and blood sugar was reading "high". Patient reports he was asymptomatic.  He was instructed to come directly to the emergency department, but decided to go home and get something to eat.  He took 15 units of regular insulin  before he ate.  Currently, he states that the stump of his right foot is hurting.  He denies having a wound.  No fever.      Physical Exam   Triage Vital Signs: ED Triage Vitals  Encounter Vitals Group     BP 08/01/23 1150 119/65     Systolic BP Percentile --      Diastolic BP Percentile --      Pulse Rate 08/01/23 1150 99     Resp 08/01/23 1150 17     Temp 08/01/23 1150 97.7 F (36.5 C)     Temp Source 08/01/23 1150 Oral     SpO2 08/01/23 1150 95 %     Weight 08/01/23 1151 197 lb (89.4 kg)     Height 08/01/23 1151 5\' 7"  (1.702 m)     Head Circumference --      Peak Flow --      Pain Score 08/01/23 1150 6     Pain Loc --      Pain Education --      Exclude from Growth Chart --     Most recent vital signs: Vitals:   08/01/23 1535 08/01/23 1605  BP: 122/79 136/75  Pulse: 81 81  Resp: 12 12  Temp:    SpO2: 100% 100%    General: Awake, no distress.  CV:  Good peripheral perfusion.  Resp:  Normal effort.  Abd:  No distention.  Other:  Skin overlying the first metatarsal stump is mildly erythematous and appears to have a very superficial abrasion.   ED Results / Procedures / Treatments   Labs (all labs ordered are listed, but only abnormal  results are displayed) Labs Reviewed  BASIC METABOLIC PANEL WITH GFR - Abnormal; Notable for the following components:      Result Value   Sodium 129 (*)    Chloride 96 (*)    CO2 21 (*)    Glucose, Bld 473 (*)    BUN 23 (*)    Calcium  8.3 (*)    All other components within normal limits  CBC - Abnormal; Notable for the following components:   WBC 12.7 (*)    All other components within normal limits  URINALYSIS, ROUTINE W REFLEX MICROSCOPIC - Abnormal; Notable for the following components:   Color, Urine STRAW (*)    APPearance CLEAR (*)    Glucose, UA >=500 (*)    Hgb urine dipstick SMALL (*)    Bacteria, UA RARE (*)    All other components within normal limits  CBG MONITORING, ED - Abnormal; Notable for the following components:   Glucose-Capillary 457 (*)    All other components within normal limits  CBG MONITORING, ED - Abnormal; Notable for  the following components:   Glucose-Capillary 208 (*)    All other components within normal limits  CULTURE, BLOOD (ROUTINE X 2)  CULTURE, BLOOD (ROUTINE X 2)  LACTIC ACID, PLASMA     EKG  Not indicated   RADIOLOGY  Image and radiology report reviewed and interpreted by me. Radiology report consistent with the same.  Not indicated  PROCEDURES:  Critical Care performed: No  Procedures   MEDICATIONS ORDERED IN ED:  Medications  sodium chloride  0.9 % bolus 1,000 mL (0 mLs Intravenous Stopped 08/01/23 1630)     IMPRESSION / MDM / ASSESSMENT AND PLAN / ED COURSE   I have reviewed the triage note.  Differential diagnosis includes, but is not limited to, hyperglycemia, DKA, sepsis, hypovolemic  Patient's presentation is most consistent with acute presentation with potential threat to life or bodily function.  Patient is on the cardiac monitor to evaluate for rate or rhythm changes.  53 year old male presenting to the emergency department for treatment and evaluation of hyperglycemia and hypotension.  See HPI for  further details.  Triage vital signs are reassuring.  He is not currently hypotensive or tachycardic.  He is afebrile and not tachypneic.  Labs obtained while awaiting ER room assignment show a blood glucose of 457.  BMP reflects hyperglycemia with a sodium of 129 and a glucose of 473, chloride of 96, CO2 21.  BUN is slightly elevated at 23 which is at patient's baseline.  CBC indicates a mild leukocytosis with a white blood cell count of 12.7.  No shift.  Lactic acid is normal at 1.8.  On exam, he does have some mild redness overlying the stump of the first metatarsal with a very superficial abrasion likely coming from his shoe.  No other wounds are noted on the foot.  No lower extremity edema.  Plan will be to administer fluids in hopes to correct the electrolyte imbalance.   After 1 L of fluids CBG is 208.  His blood pressure has remained stable throughout this visit.  Results reviewed with the patient.  Plan of discharge discussed and patient is comfortable going home.  He tells me that he has a follow-up with podiatry tomorrow.  Based on today's exam, I do not see any indication of cellulitis or osteomyelitis.  Patient was encouraged to maintain his diabetic diet and continue to monitor his glucose.  In addition to seeing podiatry, he was advised to follow-up with primary care for further management of his hyperglycemia.      FINAL CLINICAL IMPRESSION(S) / ED DIAGNOSES   Final diagnoses:  Hyperglycemia  Acute foot pain, right     Rx / DC Orders   ED Discharge Orders     None        Note:  This document was prepared using Dragon voice recognition software and may include unintentional dictation errors.   Sherryle Don, FNP 08/01/23 1932    Ruth Cove, MD 08/02/23 2207

## 2023-08-01 NOTE — ED Notes (Signed)
 Pt verbalizes understanding of discharge instructions. Opportunity for questioning and answers were provided. Pt discharged from ED to home.   ? ?

## 2023-08-02 ENCOUNTER — Encounter: Payer: Self-pay | Admitting: Podiatry

## 2023-08-02 ENCOUNTER — Ambulatory Visit (INDEPENDENT_AMBULATORY_CARE_PROVIDER_SITE_OTHER): Payer: Medicare Other | Admitting: Podiatry

## 2023-08-02 DIAGNOSIS — M79675 Pain in left toe(s): Secondary | ICD-10-CM

## 2023-08-02 DIAGNOSIS — E1142 Type 2 diabetes mellitus with diabetic polyneuropathy: Secondary | ICD-10-CM

## 2023-08-02 DIAGNOSIS — B351 Tinea unguium: Secondary | ICD-10-CM

## 2023-08-02 NOTE — Patient Instructions (Signed)
   If you continue to have pain in your left foot, call and schedule an appointment with Dr. Michalene Agee for evaluation.    Purchase Nervive Pain Cream or Roll On. Apply to feet before bedtime. Can be purchased at your local drug store over the counter.   For dry skin, recommend daily use of Bag Balm Hand and Body Moistuizer which may be purchased at local drug store or on [employer].

## 2023-08-02 NOTE — Progress Notes (Unsigned)
  Subjective:  Patient ID: Fernando Vega, male    DOB: 21-Jun-1970,  MRN: [id number]  53 y.o. male presents to clinic with  {jgcomplaint:23593}  Chief Complaint  Patient presents with   Diabetes    "I come back to have my nails trimmed."  Dr. Jearlean Mince - 08/02/2023;  A1c - 9     New problem(s): None {jgcomplaint:23593}  PCP is [facility].  Allergies  Allergen Reactions   Gramineae Pollens Other (See Comments)    Sinus    Review of Systems: Negative except as noted in the HPI.   Objective:  Fernando Vega is a pleasant 53 y.o. male {jgbodyhabitus:24098} AAO x 3.  Vascular Examination: Vascular status intact b/l with palpable pedal pulses. CFT immediate b/l. No edema. No pain with calf compression b/l. Skin temperature gradient WNL b/l. {jgvascular:23595}  Neurological Examination: Sensation grossly intact b/l with 10 gram monofilament. Vibratory sensation intact b/l. {jgneuro:23601::"Protective sensation intact 5/5 intact bilaterally with 10g monofilament b/l.","Vibratory sensation intact b/l.","Proprioception intact bilaterally."}  Dermatological Examination: Pedal skin with normal turgor, texture and tone b/l. Toenails 1-5 b/l thick, discolored, elongated with subungual debris and pain on dorsal palpation. No hyperkeratotic lesions noted b/l. {jgderm:23598}  Musculoskeletal Examination: Muscle strength 5/5 to b/l LE. Lower extremity amputation(s): Lisfranc amputation right foot. Ambulates with cane assistance.  Radiographs: None  Last A1c:      Latest Ref Rng & Units 09/03/2022    5:57 AM  Hemoglobin A1C  Hemoglobin-A1c 4.8 - 5.6 % 7.0      Assessment:  No diagnosis found.  Plan:  {jgplan:23602::"-Patient/POA to call should there be question/concern in the interim."}  Return in about 3 months (around 11/02/2023).  Luella Sager, DPM      [place] LOCATION: [address] [place] [address]                   Office [phone]   [facility] LOCATION: [address] [place], [place] [address] Office [phone]

## 2023-08-06 LAB — CULTURE, BLOOD (ROUTINE X 2)
Culture: NO GROWTH
Culture: NO GROWTH

## 2023-08-07 ENCOUNTER — Encounter: Payer: Self-pay | Admitting: Podiatry

## 2023-08-07 ENCOUNTER — Ambulatory Visit: Admitting: Sleep Medicine

## 2023-08-14 ENCOUNTER — Encounter: Payer: Self-pay | Admitting: Sleep Medicine

## 2023-08-14 ENCOUNTER — Ambulatory Visit: Admitting: Sleep Medicine

## 2023-08-14 VITALS — BP 130/90 | HR 87 | Temp 97.8°F | Ht 67.0 in | Wt 204.2 lb

## 2023-08-14 DIAGNOSIS — E66811 Obesity, class 1: Secondary | ICD-10-CM | POA: Diagnosis not present

## 2023-08-14 DIAGNOSIS — I10 Essential (primary) hypertension: Secondary | ICD-10-CM

## 2023-08-14 DIAGNOSIS — G4731 Primary central sleep apnea: Secondary | ICD-10-CM

## 2023-08-14 DIAGNOSIS — G4733 Obstructive sleep apnea (adult) (pediatric): Secondary | ICD-10-CM

## 2023-08-14 DIAGNOSIS — E119 Type 2 diabetes mellitus without complications: Secondary | ICD-10-CM | POA: Diagnosis not present

## 2023-08-14 DIAGNOSIS — Z6831 Body mass index (BMI) 31.0-31.9, adult: Secondary | ICD-10-CM

## 2023-08-14 DIAGNOSIS — Z794 Long term (current) use of insulin: Secondary | ICD-10-CM

## 2023-08-14 NOTE — Progress Notes (Signed)
 Name:Fernando Vega MRN: [id number] DOB: Jan 23, 1971   CHIEF COMPLAINT:  EXCESSIVE DAYTIME SLEEPINESS   HISTORY OF PRESENT ILLNESS:  Fernando Vega is a 53 y.o. w/ a h/o HTN, hyperlipidemia, DMII, obesity and GERD who presents for c/o loud snoring, witnessed apnea and excessive daytime sleepiness which has been present for several years. The patient underwent PSG and CPAP titration on 02/14/23 which revealed moderate OSA and severe hypoxemia. The patient was subsequently titrated to BIPAP ST.  Reports nocturnal awakenings due to unclear reasons, and has difficulty falling back to sleep. Denies any significant weight changes. Admits to dry mouth and morning headaches. Denies RLS symptoms, dream enactment, cataplexy, hypnagogic or hypnapompic hallucinations. Reports a family history of sleep apnea. Denies drowsy driving. Drinks 2 L soda daily, denies alcohol, tobacco or denies illicit drug use.   Bedtime 10 pm Sleep onset 10 mins Rise time 6 am   EPWORTH SLEEP SCORE 14    08/14/2023    9:00 AM  Results of the Epworth flowsheet  Sitting and reading 3  Watching TV 3  Sitting, inactive in a public place (e.g. a theatre or a meeting) 0  As a passenger in a car for an hour without a break 3  Lying down to rest in the afternoon when circumstances permit 2  Sitting and talking to someone 0  Sitting quietly after a lunch without alcohol 3  In a car, while stopped for a few minutes in traffic 0  Total score 14    PAST MEDICAL HISTORY :   has a past medical history of Diabetes mellitus without complication (HCC), Hypertension, Neuropathy, Sarcoidosis, and Sleep apnea.  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 Prior to Admission medications   Medication Sig Start Date End Date Taking? Authorizing Provider  acetaminophen  (TYLENOL ) 500 MG tablet Take 1 tablet (500 mg total) by mouth every 6 (six) hours as needed for mild pain (or Fever >/= 101). 12/19/20  Yes Abbe Abate, MD  albuterol  (VENTOLIN  HFA) 108 (90 Base) MCG/ACT inhaler Inhale 2 puffs into the lungs once every 6 (six) hours as needed for wheezing or shortness of breath. 01/27/21  Yes Iloabachie, Chioma E, NP  alfuzosin  (UROXATRAL ) 10 MG 24 hr tablet Take 1 tablet (10 mg total) by mouth daily with breakfast. 01/20/22  Yes Vaillancourt, Samantha, PA-C  amLODipine  (NORVASC ) 5 MG tablet Take 5 mg by mouth daily. 08/08/22  Yes [provider]  Ascorbic Acid  (VITAMIN C WITH ROSE HIPS) 500 MG tablet Take 500 mg by mouth daily.   Yes [provider]  atorvastatin  (LIPITOR) 20 MG tablet Take 20 mg by mouth daily. 05/27/21  Yes [provider]  cetirizine  (ZYRTEC ) 5 MG tablet Take 5 mg by mouth daily.   Yes [provider]  Continuous Glucose Receiver (DEXCOM G7 RECEIVER) DEVI SMARTSIG:1 Unit(s) As Directed   Yes [provider]  Continuous Glucose Sensor (DEXCOM G7 SENSOR) MISC SMARTSIG:1 device Topical Once a Month   Yes [provider]  fluticasone  (FLONASE ) 50 MCG/ACT nasal spray Place 1 spray into both nostrils 2 (two) times daily.  Yes [provider]  Glucagon (GVOKE HYPOPEN 2-PACK) 1 MG/0.2ML SOAJ GVOKE HYPOPEN 2-PACK 1 MG/0.2ML SOAJ 08/08/22  Yes [provider]  Insulin  Glargine (BASAGLAR  KWIKPEN) 100 UNIT/ML Inject 15 Units into the skin at bedtime. Patient taking differently: Inject 30 Units into the skin at bedtime.  12/30/20  Yes Iloabachie, Chioma E, NP  insulin  lispro (HUMALOG  KWIKPEN) 100 UNIT/ML KwikPen Inject 7 Units into the skin 3 (three) times daily before meals. Patient taking differently: Inject 12 Units into the skin 3 (three) times daily before meals. 01/27/21  Yes Iloabachie, Chioma E, NP  Insulin  Pen Needle 32G X 6 MM MISC USE AS DIRECTED WITH INSULIN  PENS. 12/30/20  Yes Iloabachie, Chioma E, NP  JARDIANCE 10 MG TABS tablet Take 10 mg by mouth daily. 08/15/22  Yes [provider]  Lancets Ultra Thin MISC LANCETS ULTRA THIN 06/09/21  Yes [provider]  losartan  (COZAAR ) 25 MG tablet Take 25 mg by mouth daily. 12/05/21  Yes [provider]  Multiple Vitamin (MULTIVITAMIN WITH MINERALS) TABS tablet Take 1 tablet by mouth once daily. 11/13/20  Yes Wieting, Richard, MD  Olopatadine HCl 0.2 % SOLN Apply 1 drop to eye daily. 07/10/22  Yes [provider]  pantoprazole  (PROTONIX ) 40 MG tablet Take 1 tablet (40 mg total) by mouth once daily. 12/30/20  Yes Iloabachie, Chioma E, NP  Potassium Chloride ER 20 MEQ TBCR POTASSIUM CHLORIDE ER 20 MEQ CR-TABS 09/06/22  Yes [provider]  SEMGLEE , YFGN, 100 UNIT/ML Pen Inject into the skin daily. 06/13/23  Yes [provider]  sildenafil  (REVATIO ) 20 MG tablet Take 3-5 tablets about one hour prior to intercourse. Do not exceed 5 tablets per dose. 03/31/22  Yes Vaillancourt, Samantha, PA-C   Allergies  Allergen Reactions   Gramineae Pollens Other (See Comments)    Sinus    FAMILY HISTORY:  family history includes Diabetes type II in his father; Heart disease in his father; Hypertension in his mother; Parkinson's disease in his father. SOCIAL HISTORY:  reports that he has never smoked. He has never been exposed to tobacco smoke. He has never used smokeless tobacco. He reports that he does not drink alcohol and does not use drugs.   Review of Systems:  Gen:  Denies  fever, sweats, chills weight loss  HEENT:  Denies blurred vision, double vision, ear pain, eye pain, hearing loss, nose bleeds, sore throat Cardiac:  No dizziness, chest pain or heaviness, chest tightness,edema, No JVD Resp:   No cough, -sputum production, -shortness of breath,-wheezing, -hemoptysis,  Gi: Denies swallowing difficulty, stomach pain, nausea or vomiting, diarrhea, constipation, bowel incontinence Gu:  Denies bladder incontinence, burning urine Ext:   Denies Joint pain, stiffness or swelling Skin: Denies  skin rash, easy bruising or bleeding or hives Endoc:  Denies polyuria, polydipsia , polyphagia or weight change Psych:   Denies depression, insomnia or hallucinations  Other:  All other systems negative  VITAL SIGNS: BP (!) 130/90 (BP Location: Right Arm, Patient Position: Sitting, Cuff Size: Large)   Pulse 87   Temp 97.8 F (36.6 C) (Oral)   Ht 5\' 7"  (1.702 m)   Wt 204 lb 3.2 oz (92.6 kg)   SpO2 97%   BMI 31.98 kg/m    Physical Examination:   General Appearance: No distress  EYES PERRLA, EOM intact.   NECK Supple, No JVD Pulmonary: normal breath sounds, No wheezing.  CardiovascularNormal S1,S2.  No m/r/g.   Abdomen: Benign, Soft, non-tender. Skin:   warm, no  rashes, no ecchymosis  Extremities: normal, no cyanosis, clubbing. Neuro:without focal findings,  speech normal  PSYCHIATRIC: Mood, affect within normal limits.   ASSESSMENT AND PLAN  OSA Reviewed sleep study results with patient and starting on BIPAP ST therapy set to 24/.Discussed the consequences of untreated sleep apnea. Advised not to drive drowsy for safety of patient and others. Will complete further evaluation with a home sleep study and follow up to review results.    HTN Stable, on current management. Following with PCP.   Obesity Counseled patient on diet and lifestyle modification.    DMII Stable, on current management. Following with PCP.    Patient  satisfied with Plan of action and management. All questions answered  I  spent a total of 46 minutes reviewing chart data, face-to-face evaluation with the patient, counseling and coordination of care as detailed above.    Marillyn Goren, M.D.  Sleep Medicine [facility]

## 2023-08-14 NOTE — Patient Instructions (Signed)

## 2023-08-25 ENCOUNTER — Emergency Department
Admission: EM | Admit: 2023-08-25 | Discharge: 2023-08-25 | Disposition: A | Discharge status: Home / Self Care | Attending: Emergency Medicine | Admitting: Emergency Medicine

## 2023-08-25 ENCOUNTER — Emergency Department

## 2023-08-25 ENCOUNTER — Other Ambulatory Visit: Payer: Self-pay

## 2023-08-25 DIAGNOSIS — E1165 Type 2 diabetes mellitus with hyperglycemia: Secondary | ICD-10-CM | POA: Diagnosis not present

## 2023-08-25 DIAGNOSIS — M79672 Pain in left foot: Secondary | ICD-10-CM | POA: Diagnosis present

## 2023-08-25 DIAGNOSIS — L97529 Non-pressure chronic ulcer of other part of left foot with unspecified severity: Secondary | ICD-10-CM | POA: Insufficient documentation

## 2023-08-25 DIAGNOSIS — E13621 Other specified diabetes mellitus with foot ulcer: Secondary | ICD-10-CM

## 2023-08-25 DIAGNOSIS — Z794 Long term (current) use of insulin: Secondary | ICD-10-CM | POA: Diagnosis not present

## 2023-08-25 DIAGNOSIS — E11621 Type 2 diabetes mellitus with foot ulcer: Secondary | ICD-10-CM | POA: Insufficient documentation

## 2023-08-25 LAB — COMPREHENSIVE METABOLIC PANEL WITH GFR
ALT: 17 U/L (ref 0–44)
AST: 17 U/L (ref 15–41)
Albumin: 3.8 g/dL (ref 3.5–5.0)
Alkaline Phosphatase: 67 U/L (ref 38–126)
Anion gap: 9 (ref 5–15)
BUN: 20 mg/dL (ref 6–20)
CO2: 27 mmol/L (ref 22–32)
Calcium: 8.6 mg/dL — ABNORMAL LOW (ref 8.9–10.3)
Chloride: 101 mmol/L (ref 98–111)
Creatinine, Ser: 0.88 mg/dL (ref 0.61–1.24)
GFR, Estimated: >60 mL/min (ref >60)
Glucose, Bld: 303 mg/dL — ABNORMAL HIGH (ref 70–99)
Potassium: 4.5 mmol/L (ref 3.5–5.1)
Sodium: 137 mmol/L (ref 135–145)
Total Bilirubin: 0.8 mg/dL (ref 0.0–1.2)
Total Protein: 6.6 g/dL (ref 6.5–8.1)

## 2023-08-25 LAB — CBC WITH DIFFERENTIAL/PLATELET
Abs Immature Granulocytes: 0.04 10*3/uL (ref 0.00–0.07)
Basophils Absolute: 0 10*3/uL (ref 0.0–0.1)
Basophils Relative: 0 %
Eosinophils Absolute: 0.3 10*3/uL (ref 0.0–0.5)
Eosinophils Relative: 3 %
HCT: 47.2 % (ref 39.0–52.0)
Hemoglobin: 15.3 g/dL (ref 13.0–17.0)
Immature Granulocytes: 0 %
Lymphocytes Relative: 25 %
Lymphs Abs: 2.4 10*3/uL (ref 0.7–4.0)
MCH: 30.8 pg (ref 26.0–34.0)
MCHC: 32.4 g/dL (ref 30.0–36.0)
MCV: 95 fL (ref 80.0–100.0)
Monocytes Absolute: 0.8 10*3/uL (ref 0.1–1.0)
Monocytes Relative: 9 %
Neutro Abs: 6.1 10*3/uL (ref 1.7–7.7)
Neutrophils Relative %: 63 %
Platelets: 267 10*3/uL (ref 150–400)
RBC: 4.97 MIL/uL (ref 4.22–5.81)
RDW: 11.9 % (ref 11.5–15.5)
WBC: 9.6 10*3/uL (ref 4.0–10.5)
nRBC: 0 % (ref 0.0–0.2)

## 2023-08-25 MED ORDER — DOXYCYCLINE HYCLATE 100 MG PO TABS
100.0000 mg | ORAL_TABLET | Freq: Two times a day (BID) | ORAL | 0 refills | Status: AC
Start: 2023-08-25 — End: 2023-09-01

## 2023-08-25 MED ORDER — CEPHALEXIN 500 MG PO CAPS: 500.0000 mg | ORAL_CAPSULE | Freq: Two times a day (BID) | ORAL | 0 refills | Status: AC

## 2023-08-25 NOTE — ED Provider Notes (Signed)
 [facility] Provider Note    Event Date/Time   First MD Initiated Contact with Patient 08/25/23 0800     (approximate)   History   No chief complaint on file.   HPI  Fernando Vega is a 53 y.o. male who comes in with concerns for left foot pain.  Patient reports that he is diabetic he had prior surgery on his right foot with podiatry where he had to have an amputation.  He states that his son noticed an ulcer on his left big toe with a little bit of the bleeding bloody discharge from it which prompted him to come in.  Denies any fevers, cellulitis going up the leg or any other concerns.  He really denies any pain.  He states that is not sure how long it has been there given he reports that he has bad vision.    Physical Exam   Triage Vital Signs: ED Triage Vitals  Encounter Vitals Group     BP 08/25/23 0750 (!) 153/83     Girls Systolic BP Percentile --      Girls Diastolic BP Percentile --      Boys Systolic BP Percentile --      Boys Diastolic BP Percentile --      Pulse Rate 08/25/23 0750 92     Resp 08/25/23 0750 18     Temp 08/25/23 0750 97.8 F (36.6 C)     Temp Source 08/25/23 0750 Oral     SpO2 08/25/23 0750 98 %     Weight 08/25/23 0746 204 lb 2.3 oz (92.6 kg)     Height --      Head Circumference --      Peak Flow --      Pain Score 08/25/23 0746 0     Pain Loc --      Pain Education --      Exclude from Growth Chart --     Most recent vital signs: Vitals:   08/25/23 0750  BP: (!) 153/83  Pulse: 92  Resp: 18  Temp: 97.8 F (36.6 C)  SpO2: 98%     General: Awake, no distress.  CV:  Good peripheral perfusion.  Resp:  Normal effort.  Abd:  No distention.  Other:  Quarter sized Ulcer noted to the lateral aspect of the left big toe without any significant redness.  No active drainage other than a tiny bit of blood tinged drainage.  Good distal pulse.   ED Results / Procedures / Treatments   Labs (all labs ordered are  listed, but only abnormal results are displayed) Labs Reviewed  COMPREHENSIVE METABOLIC PANEL WITH GFR - Abnormal; Notable for the following components:      Result Value   Glucose, Bld 303 (*)    Calcium  8.6 (*)    All other components within normal limits  CBC WITH DIFFERENTIAL/PLATELET      RADIOLOGY I have reviewed the xray personally and interpreted  no signs of osteo   PROCEDURES:  Critical Care performed: No  Procedures   MEDICATIONS ORDERED IN ED: Medications - No data to display   IMPRESSION / MDM / ASSESSMENT AND PLAN / ED COURSE  I reviewed the triage vital signs and the nursing notes.   Patient's presentation is most consistent with acute presentation with potential threat to life or bodily function.   Patient comes in with foot ulcer.  Patient does show elevated glucose but no evidence of DKA.  Discussed with patient following up with his primary care doctor to discuss his hyperglycemia.  His CBC is reassuring with normal white count no evidence of sepsis.  X-ray without evidence of osteomyelitis and on examination ulcer appears superficial in nature.  Good distal pulse unlikely arterial issue.  Discussed with podiatry Dr. Luster Salters who recommended patient call on Monday to get follow-up outpatient will start patient on antibiotics understands return precautions in regards to the above     FINAL CLINICAL IMPRESSION(S) / ED DIAGNOSES   Final diagnoses:  Other specified diabetes mellitus with foot ulcer, with long-term current use of insulin  (HCC)     Rx / DC Orders   ED Discharge Orders     None        Note:  This document was prepared using Dragon voice recognition software and may include unintentional dictation errors.   Lubertha Rush, MD 08/25/23 [phone]

## 2023-08-25 NOTE — ED Triage Notes (Signed)
 C/O left big toe bleeding this morning. 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

## 2023-08-25 NOTE — Discharge Instructions (Signed)
 We are starting you on antibiotics for concern for your foot ulcer. Call podiatry on Monday to make a follow-up appointment Return to the ER for fevers, worsening redness or any other concerns Follow-up with your primary care doctor for your elevated blood sugars

## 2023-08-29 ENCOUNTER — Ambulatory Visit (INDEPENDENT_AMBULATORY_CARE_PROVIDER_SITE_OTHER): Admitting: Podiatry

## 2023-08-29 ENCOUNTER — Encounter: Payer: Self-pay | Admitting: Podiatry

## 2023-08-29 VITALS — Ht 67.0 in | Wt 204.2 lb

## 2023-08-29 DIAGNOSIS — L97522 Non-pressure chronic ulcer of other part of left foot with fat layer exposed: Secondary | ICD-10-CM

## 2023-08-29 MED ORDER — GENTAMICIN SULFATE 0.1 % EX OINT: 1.0000 | TOPICAL_OINTMENT | Freq: Every day | CUTANEOUS | 0 refills | Status: DC

## 2023-08-29 NOTE — Progress Notes (Signed)
  Subjective:  Patient ID: Fernando Vega, male    DOB: Mar 19, 1970,  MRN: [id number]  Chief Complaint  Patient presents with   Foot Ulcer    Pt is here due to ulcer on left great toe, he states his foot feels fine and is not having any pain, just worried about his toe hopes he don't have to have surgery.    53 y.o. male presents with the above complaint. History confirmed with patient.  Noticed bleeding in the sock and shoe recently.  He went to the ER to have it evaluated.  Currently on cephalexin  for this.  Objective:  Physical Exam: warm, good capillary refill, normal DP and PT pulses, and diffuse peripheral neuropathy full-thickness ulcer plantar medial left hallux measures 0.5 x 0.5 x 0.3 cm with exposed subcutaneous tissue, some fibrosis and surrounding hyperkeratosis.  Mild periwound erythema but no cellulitis.   CBC 08/25/2023 WBC 9.6  Left foot x-ray 08/25/2023 showed no evidence of osteomyelitis      Assessment:   1. Ulcer of great toe, left, with fat layer exposed (HCC)      Plan:  Patient was evaluated and treated and all questions answered.  Ulcer  -We discussed the etiology and factors that are a part of the wound healing process.  We also discussed the risk of infection both soft tissue and osteomyelitis from open ulceration.  Discussed the risk of limb loss if this happens or worsens. -Debridement as below. -Dressed with Iodosorb, DSD. -Continue home dressing changes daily with 4 x 4 gauze and gentamicin ointment.  Rx sent to pharmacy. -Continue off-loading with surgical shoe.  This was dispensed today -Vascular testing currently not indicated -HgbA1c: Currently not known last one 7.0 last summer -Last antibiotics: Complete Keflex  course -Imaging: x-ray reviewed, shows no signs of erosions, osteolysis, osteomyelitis or emphysema.  Procedure: Excisional Debridement of Wound Rationale: Removal of non-viable soft tissue from the wound to promote healing.   Anesthesia: none Post-Debridement Wound Measurements: Noted above Type of Debridement: Sharp Excisional Tissue Removed: Non-viable soft tissue Depth of Debridement: subcutaneous tissue. Technique: Sharp excisional debridement to bleeding, viable wound base.  Dressing: Dry, sterile, compression dressing. Disposition: Patient tolerated procedure well.    Return in about 3 weeks (around 09/19/2023) for wound care.       Return in about 3 weeks (around 09/19/2023) for wound care.

## 2023-09-04 NOTE — Progress Notes (Deleted)
 09/05/2023 8:53 PM   Fernando Vega 14-Jul-1970 [id number]  Referring provider: Center, [facility] [address] [place],  [place] [address]  Urological history: 1. BPH with LU TS - tadalafil  5 mg daily   2. ED - tadalafil  5 mg daily - sildenafil  20 mg, on-demand-dosing   No chief complaint on file.  HPI: Fernando Vega is a 53 y.o. man who presents today for ED.   Previous records reviewed.    IPSS score: *** PVR: ***   Previous score: ***   Previous PVR: ***   Major complaint(s):  x *** years. Denies any dysuria, hematuria or suprapubic pain.   Currently taking: ***.  He has had ***.   Denies any recent fevers, chills, nausea or vomiting.  He has a family history of PCa, colon cancer, ovarian cancer and/or breast cancer with ***.   He does not have a family history of PCa, colon cancer, ovarian cancer, and/or breast cancer .***     UA negative for nitrites, leukocytes, blood and bacteria ***     Score:  1-7 Mild 8-19 Moderate 20-35 Severe    SHIM score: ***    Previous SHIM score: *** Main complaint: *** x *** years Risk factors:  age, pelvic radiation, BPH, prostate cancer, stroke, Parkinson's disease, MS, hypogonadism, spinal injury, brain injury, DM, HTN, HLD, hypothyroidism, sleep apnea, CAD, stress, night shift work, anxiety, depression, alcohol abuse, smoking, antidepressants and pain medication ***   No *** painful erections or curvatures with his erections.    Still having/no longer having spontaneous erections. *** Tried:         Score: 1-7 Severe ED 8-11 Moderate ED 12-16 Mild-Moderate ED 17-21 Mild ED 22-25 No ED      PMH: Past Medical History:  Diagnosis Date   Diabetes mellitus without complication (HCC)    Hypertension    Neuropathy    Sarcoidosis    Sleep apnea     Surgical History: Past Surgical History:  Procedure Laterality Date   AMPUTATION Right 12/13/2020   Procedure: AMPUTATION midFOOT-RIGHT;   Surgeon: Silva Juliene JONELLE, DPM;  Location: [facility];  Service: Podiatry;  Laterality: Right;   AMPUTATION Right 11/05/2020   Procedure: AMPUTATION RAY-Partial Ray Amp 2nd & 3rd Digit;  Surgeon: Janit Thresa HERO, DPM;  Location: [facility];  Service: Podiatry;  Laterality: Right;   APPLICATION OF WOUND VAC Right 11/09/2020   Procedure: APPLICATION OF WOUND VAC;  Surgeon: Janit Thresa HERO, DPM;  Location: [facility];  Service: Podiatry;  Laterality: Right;   BONE BIOPSY Right 01/08/2021   Procedure: BONE BIOPSY;  Surgeon: Gershon Donnice JONELLE, DPM;  Location: [facility];  Service: Podiatry;  Laterality: Right;   COLONOSCOPY WITH PROPOFOL  N/A 07/19/2021   Procedure: COLONOSCOPY WITH PROPOFOL ;  Surgeon: Therisa Bi, MD;  Location: [facility]COPY;  Service: Gastroenterology;  Laterality: N/A;   COLONOSCOPY WITH PROPOFOL  N/A 11/23/2021   Procedure: COLONOSCOPY WITH PROPOFOL ;  Surgeon: Therisa Bi, MD;  Location: [facility] ENDOSCOPY;  Service: Gastroenterology;  Laterality: N/A;   COLONOSCOPY WITH PROPOFOL  N/A 02/07/2023   Procedure: COLONOSCOPY WITH PROPOFOL ;  Surgeon: Therisa Bi, MD;  Location: [facility] ENDOSCOPY;  Service: Gastroenterology;  Laterality: N/A;   INCISION AND DRAINAGE Right 11/01/2020   Procedure: INCISION AND DRAINAGE-Right Foot;  Surgeon: Silva Juliene JONELLE, DPM;  Location: [facility];  Service: Podiatry;  Laterality: Right;   IRRIGATION AND DEBRIDEMENT FOOT Right 11/09/2020   Procedure: IRRIGATION AND DEBRIDEMENT FOOT;  Surgeon: Janit Thresa HERO, DPM;  Location: [facility];  Service: Podiatry;  Laterality: Right;   IRRIGATION AND DEBRIDEMENT FOOT Right 12/13/2020   Procedure: IRRIGATION AND DEBRIDEMENT FOOT;  Surgeon: Silva Juliene SAUNDERS, DPM;  Location: [facility];  Service: Podiatry;  Laterality: Right;   TRANSMETATARSAL AMPUTATION Right 01/08/2021   Procedure: LISFRANC  AMPUTATION;  Surgeon: Gershon Donnice SAUNDERS, DPM;  Location: [facility];  Service: Podiatry;  Laterality: Right;   WOUND DEBRIDEMENT Right 12/16/2020    Procedure: DEBRIDEMENT WOUND;  Surgeon: Tobie Franky SQUIBB, DPM;  Location: [facility];  Service: Podiatry;  Laterality: Right;  Transmet amputation washout & closure   WOUND DEBRIDEMENT Right 01/08/2021   Procedure: DEBRIDEMENT WOUND;  Surgeon: Gershon Donnice SAUNDERS, DPM;  Location: [facility];  Service: Podiatry;  Laterality: Right;    Home Medications:  Allergies as of 09/05/2023       Reactions   Gramineae Pollens Other (See Comments)   Sinus        Medication List        Accurate as of September 04, 2023  8:53 PM. If you have any questions, ask your nurse or doctor.          acetaminophen  500 MG tablet Commonly known as: TYLENOL  Take 1 tablet (500 mg total) by mouth every 6 (six) hours as needed for mild pain (or Fever >/= 101).   albuterol  108 (90 Base) MCG/ACT inhaler Commonly known as: VENTOLIN  HFA Inhale 2 puffs into the lungs once every 6 (six) hours as needed for wheezing or shortness of breath.   alfuzosin  10 MG 24 hr tablet Commonly known as: UROXATRAL  Take 1 tablet (10 mg total) by mouth daily with breakfast.   amLODipine  5 MG tablet Commonly known as: NORVASC  Take 5 mg by mouth daily.   atorvastatin  20 MG tablet Commonly known as: LIPITOR Take 20 mg by mouth daily.   Basaglar  KwikPen 100 UNIT/ML Inject 15 Units into the skin at bedtime. What changed: how much to take   cetirizine  5 MG tablet Commonly known as: ZYRTEC  Take 5 mg by mouth daily.   Dexcom G7 Receiver Devi SMARTSIG:1 Unit(s) As Directed   Dexcom G7 Sensor Misc SMARTSIG:1 device Topical Once a Month   fluticasone  50 MCG/ACT nasal spray Commonly known as: FLONASE  Place 1 spray into both nostrils 2 (two) times daily.   gentamicin ointment 0.1 % Commonly known as: GARAMYCIN Apply 1 Application topically daily. Apply to wound daily   Gvoke HypoPen 2-Pack 1 MG/0.2ML Soaj Generic drug: Glucagon GVOKE HYPOPEN 2-PACK 1 MG/0.2ML SOAJ   insulin  lispro 100 UNIT/ML KwikPen Commonly known as:  HumaLOG  KwikPen Inject 7 Units into the skin 3 (three) times daily before meals. What changed: how much to take   Insulin  Pen Needle 32G X 6 MM Misc USE AS DIRECTED WITH INSULIN  PENS.   Jardiance 10 MG Tabs tablet Generic drug: empagliflozin Take 10 mg by mouth daily.   Lancets Ultra Thin Misc LANCETS ULTRA THIN   losartan  25 MG tablet Commonly known as: COZAAR  Take 25 mg by mouth daily.   multivitamin with minerals Tabs tablet Take 1 tablet by mouth once daily.   Olopatadine HCl 0.2 % Soln Apply 1 drop to eye daily.   pantoprazole  40 MG tablet Commonly known as: PROTONIX  Take 1 tablet (40 mg total) by mouth once daily.   Potassium Chloride ER 20 MEQ Tbcr POTASSIUM CHLORIDE ER 20 MEQ CR-TABS   Semglee  (yfgn) 100 UNIT/ML Pen Generic drug: insulin  glargine-yfgn Inject into the skin daily.   sildenafil  20  MG tablet Commonly known as: REVATIO  Take 3-5 tablets about one hour prior to intercourse. Do not exceed 5 tablets per dose.   vitamin C with rose hips 500 MG tablet Take 500 mg by mouth daily.        Allergies:  Allergies  Allergen Reactions   Gramineae Pollens Other (See Comments)    Sinus    Family History: Family History  Problem Relation Age of Onset   Hypertension Mother    Diabetes type II Father    Parkinson's disease Father    Heart disease Father     Social History:  reports that he has never smoked. He has never been exposed to tobacco smoke. He has never used smokeless tobacco. He reports that he does not drink alcohol and does not use drugs.  ROS: Pertinent ROS in HPI  Physical Exam: There were no vitals taken for this visit.  Constitutional:  Well nourished. Alert and oriented, No acute distress. HEENT: [place] AT, moist mucus membranes.  Trachea midline, no masses. Cardiovascular: No clubbing, cyanosis, or edema. Respiratory: Normal respiratory effort, no increased work of breathing. GI: Abdomen is soft, non tender, non distended, no  abdominal masses. Liver and spleen not palpable.  No hernias appreciated.  Stool sample for occult testing is not indicated.   GU: No CVA tenderness.  No bladder fullness or masses.  Patient with circumcised/uncircumcised phallus. ***Foreskin easily retracted***  Urethral meatus is patent.  No penile discharge. No penile lesions or rashes. Scrotum without lesions, cysts, rashes and/or edema.  Testicles are located scrotally bilaterally. No masses are appreciated in the testicles. Left and right epididymis are normal. Rectal: Patient with  normal sphincter tone. Anus and perineum without scarring or rashes. No rectal masses are appreciated. Prostate is approximately *** grams, *** nodules are appreciated. Seminal vesicles are normal. Skin: No rashes, bruises or suspicious lesions. Lymph: No cervical or inguinal adenopathy. Neurologic: Grossly intact, no focal deficits, moving all 4 extremities. Psychiatric: Normal mood and affect.  Laboratory Data: Lab Results  Component Value Date   WBC 9.6 08/25/2023   HGB 15.3 08/25/2023   HCT 47.2 08/25/2023   MCV 95.0 08/25/2023   PLT 267 08/25/2023    Lab Results  Component Value Date   CREATININE 0.88 08/25/2023    Lab Results  Component Value Date   AST 17 08/25/2023   Lab Results  Component Value Date   ALT 17 08/25/2023    Urinalysis *** See EPIC and HPI  I have reviewed the labs.   Pertinent Imaging: N/A  Assessment & Plan:  ***  1. Erectile dysfunction - I explained to the patient that in order to achieve an erection it takes good functioning of the nervous system (parasympathetic and rs, sympathetic, sensory and motor), good blood flow into the erectile tissue of the penis and a desire to have sex - I explained that conditions like diabetes, hypertension, coronary artery disease, peripheral vascular disease, smoking, alcohol consumption, age, sleep apnea and BPH can diminish the ability to have an erection - I explained the ED  may be a risk marker for underlying CVD and he should follow up with PCP for further studies *** - we will obtain a serum testosterone level at this time; if it is abnormal we will need to repeat the study for confirmation *** - A recent study published in Sex Med 2018 Apr 13 revealed moderate to vigorous aerobic exercise for 40 minutes 4 times per week can decrease erectile problems caused  by physical inactivity, obesity, hypertension, metabolic syndrome and/or cardiovascular diseases *** - We discussed trying a *** different PDE5 inhibitor, intra-urethral suppositories, intracavernous vasoactive drug injection therapy, vacuum erection devices, LI-ESWT and penile prosthesis implantation   2. 1. BPH with LU TS - stable, improving, worsening mild, moderate severe symptoms *** - no signs of retention, infection or malignancy *** - PSA up to date *** - DRE benign *** - UA benign *** - PVR < 300 cc *** - most bothersome symptoms are *** - encouraged avoiding bladder irritants, fluid restriction before bedtime and timed voiding's - Initiate alpha-blocker (***), discussed side effects *** - Initiate 5 alpha reductase inhibitor (***), discussed side effects *** - Continue tamsulosin 0.4 mg daily, alfuzosin  10 mg daily, Rapaflo  8 mg daily, terazosin, doxazosin, Cialis  5 mg daily and finasteride 5 mg daily, dutasteride 0.5 mg daily***:refills given - Cannot tolerate medication or medication failure, schedule cystoscopy *** - educated on red flag symptoms: acute retention, gross hematuria, fever, severe pain - advised to call clinic or go to the ED if these occur - return to clinic in *** symptom re-evaluation ***     No follow-ups on file.  These notes generated with voice recognition software. I apologize for typographical errors.  CLOTILDA HELON RIGGERS  [facility] [address]  [address] [place], [place] [address] [phone]

## 2023-09-05 ENCOUNTER — Ambulatory Visit: Admitting: Urology

## 2023-09-05 DIAGNOSIS — N529 Male erectile dysfunction, unspecified: Secondary | ICD-10-CM

## 2023-09-05 DIAGNOSIS — N401 Enlarged prostate with lower urinary tract symptoms: Secondary | ICD-10-CM

## 2023-09-10 ENCOUNTER — Encounter: Payer: Self-pay | Admitting: Urology

## 2023-09-12 NOTE — Addendum Note (Signed)
 Addended by: Dahlia Nifong on: 09/12/2023 02:38 PM   Modules accepted: Orders

## 2023-09-19 ENCOUNTER — Ambulatory Visit (INDEPENDENT_AMBULATORY_CARE_PROVIDER_SITE_OTHER): Admitting: Podiatry

## 2023-09-19 DIAGNOSIS — Z91199 Patient's noncompliance with other medical treatment and regimen due to unspecified reason: Secondary | ICD-10-CM

## 2023-09-20 NOTE — Progress Notes (Deleted)
 09/21/2023 4:46 PM   [name] [birth year] [id number]  Referring provider: Center, [facility] [address] [place],  [place] [address]  Urological history: 1. BPH with LU TS - tadalafil  5 mg daily   2. ED - tadalafil  5 mg daily - sildenafil  20 mg, on-demand-dosing   No chief complaint on file.  HPI: Fernando Vega is a 53 y.o. man who presents today for ED.   Previous records reviewed.    IPSS score: *** PVR: ***   Previous score: ***   Previous PVR: ***   Major complaint(s):  x *** years. Denies any dysuria, hematuria or suprapubic pain.   Currently taking: ***.  He has had ***.   Denies any recent fevers, chills, nausea or vomiting.  He has a family history of PCa, colon cancer, ovarian cancer and/or breast cancer with ***.   He does not have a family history of PCa, colon cancer, ovarian cancer, and/or breast cancer .***     UA negative for nitrites, leukocytes, blood and bacteria ***     Score:  1-7 Mild 8-19 Moderate 20-35 Severe    SHIM score: ***    Previous SHIM score: *** Main complaint: *** x *** years Risk factors:  age, pelvic radiation, BPH, prostate cancer, stroke, Parkinson's disease, MS, hypogonadism, spinal injury, brain injury, DM, HTN, HLD, hypothyroidism, sleep apnea, CAD, stress, night shift work, anxiety, depression, alcohol abuse, smoking, antidepressants and pain medication ***   No *** painful erections or curvatures with his erections.    Still having/no longer having spontaneous erections. *** Tried:         Score: 1-7 Severe ED 8-11 Moderate ED 12-16 Mild-Moderate ED 17-21 Mild ED 22-25 No ED      PMH: Past Medical History:  Diagnosis Date   Diabetes mellitus without complication (HCC)    Hypertension    Neuropathy    Sarcoidosis    Sleep apnea     Surgical History: Past Surgical History:  Procedure Laterality Date   AMPUTATION Right 12/13/2020   Procedure: AMPUTATION midFOOT-RIGHT;   Surgeon: Silva Juliene JONELLE, DPM;  Location: [facility];  Service: Podiatry;  Laterality: Right;   AMPUTATION Right 11/05/2020   Procedure: AMPUTATION RAY-Partial Ray Amp 2nd & 3rd Digit;  Surgeon: Janit Thresa HERO, DPM;  Location: [facility];  Service: Podiatry;  Laterality: Right;   APPLICATION OF WOUND VAC Right 11/09/2020   Procedure: APPLICATION OF WOUND VAC;  Surgeon: Janit Thresa HERO, DPM;  Location: [facility];  Service: Podiatry;  Laterality: Right;   BONE BIOPSY Right 01/08/2021   Procedure: BONE BIOPSY;  Surgeon: Gershon Donnice JONELLE, DPM;  Location: [facility];  Service: Podiatry;  Laterality: Right;   COLONOSCOPY WITH PROPOFOL  N/A 07/19/2021   Procedure: COLONOSCOPY WITH PROPOFOL ;  Surgeon: Therisa Bi, MD;  Location: [facility];  Service: Gastroenterology;  Laterality: N/A;   COLONOSCOPY WITH PROPOFOL  N/A 11/23/2021   Procedure: COLONOSCOPY WITH PROPOFOL ;  Surgeon: Therisa Bi, MD;  Location: [facility] ENDOSCOPY;  Service: Gastroenterology;  Laterality: N/A;   COLONOSCOPY WITH PROPOFOL  N/A 02/07/2023   Procedure: COLONOSCOPY WITH PROPOFOL ;  Surgeon: Therisa Bi, MD;  Location: [facility]COPY;  Service: Gastroenterology;  Laterality: N/A;   INCISION AND DRAINAGE Right 11/01/2020   Procedure: INCISION AND DRAINAGE-Right Foot;  Surgeon: Silva Juliene JONELLE, DPM;  Location: [facility];  Service: Podiatry;  Laterality: Right;   IRRIGATION AND DEBRIDEMENT FOOT Right 11/09/2020   Procedure: IRRIGATION AND DEBRIDEMENT FOOT;  Surgeon: Janit Thresa HERO, DPM;  Location: [facility];  Service: Podiatry;  Laterality: Right;   IRRIGATION AND DEBRIDEMENT FOOT Right 12/13/2020   Procedure: IRRIGATION AND DEBRIDEMENT FOOT;  Surgeon: Silva Juliene SAUNDERS, DPM;  Location: [facility];  Service: Podiatry;  Laterality: Right;   TRANSMETATARSAL AMPUTATION Right 01/08/2021   Procedure: LISFRANC  AMPUTATION;  Surgeon: Gershon Donnice SAUNDERS, DPM;  Location: [facility];  Service: Podiatry;  Laterality: Right;   WOUND DEBRIDEMENT Right 12/16/2020    Procedure: DEBRIDEMENT WOUND;  Surgeon: Tobie Franky SQUIBB, DPM;  Location: [facility];  Service: Podiatry;  Laterality: Right;  Transmet amputation washout & closure   WOUND DEBRIDEMENT Right 01/08/2021   Procedure: DEBRIDEMENT WOUND;  Surgeon: Gershon Donnice SAUNDERS, DPM;  Location: [facility];  Service: Podiatry;  Laterality: Right;    Home Medications:  Allergies as of 09/21/2023       Reactions   Gramineae Pollens Other (See Comments)   Sinus        Medication List        Accurate as of September 20, 2023  4:46 PM. If you have any questions, ask your nurse or doctor.          acetaminophen  500 MG tablet Commonly known as: TYLENOL  Take 1 tablet (500 mg total) by mouth every 6 (six) hours as needed for mild pain (or Fever >/= 101).   albuterol  108 (90 Base) MCG/ACT inhaler Commonly known as: VENTOLIN  HFA Inhale 2 puffs into the lungs once every 6 (six) hours as needed for wheezing or shortness of breath.   alfuzosin  10 MG 24 hr tablet Commonly known as: UROXATRAL  Take 1 tablet (10 mg total) by mouth daily with breakfast.   amLODipine  5 MG tablet Commonly known as: NORVASC  Take 5 mg by mouth daily.   atorvastatin  20 MG tablet Commonly known as: LIPITOR Take 20 mg by mouth daily.   Basaglar  KwikPen 100 UNIT/ML Inject 15 Units into the skin at bedtime. What changed: how much to take   cetirizine  5 MG tablet Commonly known as: ZYRTEC  Take 5 mg by mouth daily.   Dexcom G7 Receiver Devi SMARTSIG:1 Unit(s) As Directed   Dexcom G7 Sensor Misc SMARTSIG:1 device Topical Once a Month   fluticasone  50 MCG/ACT nasal spray Commonly known as: FLONASE  Place 1 spray into both nostrils 2 (two) times daily.   gentamicin  ointment 0.1 % Commonly known as: GARAMYCIN  Apply 1 Application topically daily. Apply to wound daily   Gvoke HypoPen 2-Pack 1 MG/0.2ML Soaj Generic drug: Glucagon GVOKE HYPOPEN 2-PACK 1 MG/0.2ML SOAJ   insulin  lispro 100 UNIT/ML KwikPen Commonly known as:  HumaLOG  KwikPen Inject 7 Units into the skin 3 (three) times daily before meals. What changed: how much to take   Insulin  Pen Needle 32G X 6 MM Misc USE AS DIRECTED WITH INSULIN  PENS.   Jardiance 10 MG Tabs tablet Generic drug: empagliflozin Take 10 mg by mouth daily.   Lancets Ultra Thin Misc LANCETS ULTRA THIN   losartan  25 MG tablet Commonly known as: COZAAR  Take 25 mg by mouth daily.   multivitamin with minerals Tabs tablet Take 1 tablet by mouth once daily.   Olopatadine HCl 0.2 % Soln Apply 1 drop to eye daily.   pantoprazole  40 MG tablet Commonly known as: PROTONIX  Take 1 tablet (40 mg total) by mouth once daily.   Potassium Chloride ER 20 MEQ Tbcr POTASSIUM CHLORIDE ER 20 MEQ CR-TABS   Semglee  (yfgn) 100 UNIT/ML Pen Generic drug: insulin  glargine-yfgn Inject into the skin daily.   sildenafil  20  MG tablet Commonly known as: REVATIO  Take 3-5 tablets about one hour prior to intercourse. Do not exceed 5 tablets per dose.   vitamin C with rose hips 500 MG tablet Take 500 mg by mouth daily.        Allergies:  Allergies  Allergen Reactions   Gramineae Pollens Other (See Comments)    Sinus    Family History: Family History  Problem Relation Age of Onset   Hypertension Mother    Diabetes type II Father    Parkinson's disease Father    Heart disease Father     Social History:  reports that he has never smoked. He has never been exposed to tobacco smoke. He has never used smokeless tobacco. He reports that he does not drink alcohol and does not use drugs.  ROS: Pertinent ROS in HPI  Physical Exam: There were no vitals taken for this visit.  Constitutional:  Well nourished. Alert and oriented, No acute distress. HEENT: [place] AT, moist mucus membranes.  Trachea midline, no masses. Cardiovascular: No clubbing, cyanosis, or edema. Respiratory: Normal respiratory effort, no increased work of breathing. GI: Abdomen is soft, non tender, non distended, no  abdominal masses. Liver and spleen not palpable.  No hernias appreciated.  Stool sample for occult testing is not indicated.   GU: No CVA tenderness.  No bladder fullness or masses.  Patient with circumcised/uncircumcised phallus. ***Foreskin easily retracted***  Urethral meatus is patent.  No penile discharge. No penile lesions or rashes. Scrotum without lesions, cysts, rashes and/or edema.  Testicles are located scrotally bilaterally. No masses are appreciated in the testicles. Left and right epididymis are normal. Rectal: Patient with  normal sphincter tone. Anus and perineum without scarring or rashes. No rectal masses are appreciated. Prostate is approximately *** grams, *** nodules are appreciated. Seminal vesicles are normal. Skin: No rashes, bruises or suspicious lesions. Lymph: No cervical or inguinal adenopathy. Neurologic: Grossly intact, no focal deficits, moving all 4 extremities. Psychiatric: Normal mood and affect.  Laboratory Data: Lab Results  Component Value Date   WBC 9.6 08/25/2023   HGB 15.3 08/25/2023   HCT 47.2 08/25/2023   MCV 95.0 08/25/2023   PLT 267 08/25/2023    Lab Results  Component Value Date   CREATININE 0.88 08/25/2023    Lab Results  Component Value Date   AST 17 08/25/2023   Lab Results  Component Value Date   ALT 17 08/25/2023    Urinalysis *** See EPIC and HPI  I have reviewed the labs.   Pertinent Imaging: N/A  Assessment & Plan:  ***  1. Erectile dysfunction - I explained to the patient that in order to achieve an erection it takes good functioning of the nervous system (parasympathetic and rs, sympathetic, sensory and motor), good blood flow into the erectile tissue of the penis and a desire to have sex - I explained that conditions like diabetes, hypertension, coronary artery disease, peripheral vascular disease, smoking, alcohol consumption, age, sleep apnea and BPH can diminish the ability to have an erection - I explained the ED  may be a risk marker for underlying CVD and he should follow up with PCP for further studies *** - we will obtain a serum testosterone level at this time; if it is abnormal we will need to repeat the study for confirmation *** - A recent study published in Sex Med 2018 Apr 13 revealed moderate to vigorous aerobic exercise for 40 minutes 4 times per week can decrease erectile problems caused  by physical inactivity, obesity, hypertension, metabolic syndrome and/or cardiovascular diseases *** - We discussed trying a *** different PDE5 inhibitor, intra-urethral suppositories, intracavernous vasoactive drug injection therapy, vacuum erection devices, LI-ESWT and penile prosthesis implantation   2. BPH with LU TS - stable, improving, worsening mild, moderate severe symptoms *** - no signs of retention, infection or malignancy *** - PSA up to date *** - DRE benign *** - UA benign *** - PVR < 300 cc *** - most bothersome symptoms are *** - encouraged avoiding bladder irritants, fluid restriction before bedtime and timed voiding's - Initiate alpha-blocker (***), discussed side effects *** - Initiate 5 alpha reductase inhibitor (***), discussed side effects *** - Continue tamsulosin 0.4 mg daily, alfuzosin  10 mg daily, Rapaflo  8 mg daily, terazosin, doxazosin, Cialis  5 mg daily and finasteride 5 mg daily, dutasteride 0.5 mg daily***:refills given - Cannot tolerate medication or medication failure, schedule cystoscopy *** - educated on red flag symptoms: acute retention, gross hematuria, fever, severe pain - advised to call clinic or go to the ED if these occur - return to clinic in *** symptom re-evaluation ***     No follow-ups on file.  These notes generated with voice recognition software. I apologize for typographical errors.  CLOTILDA HELON RIGGERS  [facility] [address]  [address] [place], [place] [address] [phone]

## 2023-09-21 ENCOUNTER — Ambulatory Visit: Admitting: Urology

## 2023-09-21 DIAGNOSIS — R399 Unspecified symptoms and signs involving the genitourinary system: Secondary | ICD-10-CM

## 2023-09-21 DIAGNOSIS — N529 Male erectile dysfunction, unspecified: Secondary | ICD-10-CM

## 2023-09-26 NOTE — Progress Notes (Signed)
 Patient was no-show for appointment today

## 2023-10-12 DEATH — deceased

## 2023-11-02 ENCOUNTER — Ambulatory Visit: Admitting: Podiatry

## 2023-11-08 ENCOUNTER — Encounter: Payer: Self-pay | Admitting: Podiatry

## 2023-11-08 ENCOUNTER — Ambulatory Visit: Admitting: Podiatry

## 2023-11-08 ENCOUNTER — Ambulatory Visit (INDEPENDENT_AMBULATORY_CARE_PROVIDER_SITE_OTHER)

## 2023-11-08 VITALS — BP 164/87 | HR 89 | Temp 97.0°F

## 2023-11-08 DIAGNOSIS — B351 Tinea unguium: Secondary | ICD-10-CM | POA: Diagnosis not present

## 2023-11-08 DIAGNOSIS — M79675 Pain in left toe(s): Secondary | ICD-10-CM | POA: Diagnosis not present

## 2023-11-08 DIAGNOSIS — Z89431 Acquired absence of right foot: Secondary | ICD-10-CM

## 2023-11-08 DIAGNOSIS — Z872 Personal history of diseases of the skin and subcutaneous tissue: Secondary | ICD-10-CM

## 2023-11-08 DIAGNOSIS — S91001A Unspecified open wound, right ankle, initial encounter: Secondary | ICD-10-CM | POA: Diagnosis not present

## 2023-11-08 DIAGNOSIS — L97522 Non-pressure chronic ulcer of other part of left foot with fat layer exposed: Secondary | ICD-10-CM

## 2023-11-08 DIAGNOSIS — B353 Tinea pedis: Secondary | ICD-10-CM

## 2023-11-08 DIAGNOSIS — L97521 Non-pressure chronic ulcer of other part of left foot limited to breakdown of skin: Secondary | ICD-10-CM

## 2023-11-08 DIAGNOSIS — E1142 Type 2 diabetes mellitus with diabetic polyneuropathy: Secondary | ICD-10-CM

## 2023-11-08 MED ORDER — KETOCONAZOLE 2 % EX CREA: TOPICAL_CREAM | CUTANEOUS | 1 refills | Status: DC

## 2023-11-08 NOTE — Progress Notes (Signed)
 Subjective:  Patient ID: Fernando Vega, male    DOB: [birth year],  MRN: [id number]  Fernando Vega presents to clinic today for at risk foot care. Patient has h/o NIDDM, neuropathy with history of Lisfranc;s amputation RLE. He also has h/o ulcer left great toe. He missed his last appointment with Dr. Silva for management of this. He now has new development of wounds of right ankle and dorsum of the left foot which occurred after wearing high top sneakers while out of town in [place]. He saw his PCP who has prescribed Bactrim  DS and he is about to take his 2nd day dose. Chief Complaint  Patient presents with   [facility]    Rm2 Dr. Noretta Cave last visit Nov.2025/ A1C 7.0   PCP is Cave Noretta, NP.  Allergies  Allergen Reactions   Gramineae Pollens Other (See Comments)    Sinus    Review of Systems: Negative except as noted in the HPI.  Objective: No changes noted in today's physical examination. Vitals:   11/08/23 1114  BP: (!) 164/87  Pulse: 89  Temp: (!) 97 F (36.1 C)  SpO2: 96%   Fernando Vega is a pleasant 53 y.o. male in NAD. AAO x 3.  Vascular Examination: Vascular status intact LLE with palpable pulses.  CFT immediate LLE. No edema. No pain with calf compression LLE. Skin temperature gradient WNL LLE. Pedal hair sparse. No varicosities noted. No ischemia or gangrene noted b/l LE. No cyanosis or clubbing noted LLE.  Neurological Examination: Protective sensation diminished with 10g monofilament b/l.  Dermatological Examination: Punch out lesion dorsal aspect aspect of midfoot LLE and medial malleolus right ankle. +blanchable erythema. No purulence, no underlying fluctuance. Left hallux with healed diabetic  ulceration post debridement. 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  Predebridement, measures 0.5 x 0.3 cm.  Postdebridement 0.5 cm in diameter and fully epithelialized.  Diffuse scaling noted peripherally and plantarly b/l feet.  No interdigital  macerations.  No blisters, no weeping. No signs of secondary bacterial infection noted.              Pedal skin with normal turgor and tone b/l. Skin mildly dry. Toenails 1-5 left foot thick, discolored, elongated with subungual debris and pain on dorsal palpation.   Musculoskeletal Examination: Muscle strength 5/5 to b/l LE. Lower extremity amputation(s): Lisfranc's amputation RLE. Ambulates with cane assistance.  Xray findings left foot: No gas in tissues left foot. No bone erosion noted at location of ulceration left great toe. No evidence of fracture left foot.  Assessment/Plan: 1. Pain due to onychomycosis of toenail of left foot   2. Tinea pedis of both feet   3. Healed ulcer of left foot   4. Wound of ankle, right, initial encounter   5. History of Lisfranc amputation of right foot (HCC)   6. Diabetic peripheral neuropathy associated with type 2 diabetes mellitus (HCC)     Meds ordered this encounter  Medications   ketoconazole  (NIZORAL ) 2 % cream    Sig: Apply to both feet and between toes once daily for 6 weeks.    Dispense:  60 g    Refill:  1   Orders Placed This Encounter  Procedures   DG Foot Complete Left    Standing Status:   Future    Number of Occurrences:   1    Expiration Date:   11/07/2024    Reason for Exam (SYMPTOM  OR DIAGNOSIS REQUIRED):   pain  Preferred imaging location?:   Internal   Plan: -Patient was evaluated and treated and all questions answered.  -Patient/POA/Family member educated on diagnosis and treatment plan of routine ulcer debridement/wound care.  -Ulceration debridement achieved utilizing sharp excisional debridement with sterile scalpel blade.. Type/amount of devitalized tissue removed: nonviable hyperkeratosis -Today's ulcer size post-debridement: 0.5 x 0.5 x 0 cm. -Ulceration cleansed with wound cleanser. Triple antibiotic ointment applied to left great toe. -Wound responded well to today's debridement. -Xrays left  foot reviewed with patient. -Patient is taking Bactrim  DS prescribed by PCP. He is to continue this until all gone.  -For tinea pedis, Rx send for Ketoconazole  Cream 2% to be applied once daily to feet and between toes x 6 weeks. -Patient to continue soft, supportive shoe gear daily. -Toenails were debrided in length and girth 1-5 left foot with sterile nail nippers and dremel without iatrogenic bleeding.  -Patient scheduled to see Dr. Janit in next weeek for follow up of bilateral foot ulcers. -Patient/POA to call should there be question/concern in the interim.  Return in about 3 months (around 02/08/2024).  Fernando Vega, DPM      [place] LOCATION: [address] [place] [address]                   Office [phone]   [place] LOCATION: [address] [place], [place] [address] Office [phone]

## 2023-11-08 NOTE — Patient Instructions (Signed)
 [facility] IN [place]  OPEN MONDAY-THURSDAY FROM 5-7 PM FOR EMERGENCIES (INFECTIONS, INGROWN TOENAILS, ANKLE SPRAINS, WOUNDS)  [phone]

## 2023-11-13 ENCOUNTER — Encounter: Payer: Self-pay | Admitting: Podiatry

## 2023-11-13 ENCOUNTER — Ambulatory Visit (INDEPENDENT_AMBULATORY_CARE_PROVIDER_SITE_OTHER): Admitting: Podiatry

## 2023-11-13 VITALS — Ht 67.0 in | Wt 204.2 lb

## 2023-11-13 DIAGNOSIS — E08621 Diabetes mellitus due to underlying condition with foot ulcer: Secondary | ICD-10-CM

## 2023-11-13 DIAGNOSIS — L97421 Non-pressure chronic ulcer of left heel and midfoot limited to breakdown of skin: Secondary | ICD-10-CM | POA: Diagnosis not present

## 2023-11-13 NOTE — Progress Notes (Signed)
 Chief Complaint  Patient presents with   Wound Check    Pt is here due to wounds on bilateral feet, was seen here last week with Dr Gaynel, x-rays were done, no pain to either feet, left foot the area is red, currently on antibiotics, pt is a diabetic.    Subjective:  53 y.o. male with PMHx of diabetes mellitus for new onset of ulcer to the dorsum of the left foot secondary to new 'high top' tennis shoes that the patient wore but it caused a friction sore to the dorsum of the foot.  He is currently on antibiotics prescribed from his PCP   Past Medical History:  Diagnosis Date   Diabetes mellitus without complication (HCC)    Hypertension    Neuropathy    Sarcoidosis    Sleep apnea     Past Surgical History:  Procedure Laterality Date   AMPUTATION Right 12/13/2020   Procedure: AMPUTATION midFOOT-RIGHT;  Surgeon: Silva Juliene SAUNDERS, DPM;  Location: [facility];  Service: Podiatry;  Laterality: Right;   AMPUTATION Right 11/05/2020   Procedure: AMPUTATION RAY-Partial Ray Amp 2nd & 3rd Digit;  Surgeon: Janit Thresa HERO, DPM;  Location: [facility];  Service: Podiatry;  Laterality: Right;   APPLICATION OF WOUND VAC Right 11/09/2020   Procedure: APPLICATION OF WOUND VAC;  Surgeon: Janit Thresa HERO, DPM;  Location: [facility];  Service: Podiatry;  Laterality: Right;   BONE BIOPSY Right 01/08/2021   Procedure: BONE BIOPSY;  Surgeon: Gershon Donnice SAUNDERS, DPM;  Location: [facility];  Service: Podiatry;  Laterality: Right;   COLONOSCOPY WITH PROPOFOL  N/A 07/19/2021   Procedure: COLONOSCOPY WITH PROPOFOL ;  Surgeon: Therisa Bi, MD;  Location: [facility] ENDOSCOPY;  Service: Gastroenterology;  Laterality: N/A;   COLONOSCOPY WITH PROPOFOL  N/A 11/23/2021   Procedure: COLONOSCOPY WITH PROPOFOL ;  Surgeon: Therisa Bi, MD;  Location: [facility] ENDOSCOPY;  Service: Gastroenterology;  Laterality: N/A;   COLONOSCOPY WITH PROPOFOL  N/A 02/07/2023   Procedure: COLONOSCOPY WITH PROPOFOL ;  Surgeon: Therisa Bi, MD;  Location: [facility]  ENDOSCOPY;  Service: Gastroenterology;  Laterality: N/A;   INCISION AND DRAINAGE Right 11/01/2020   Procedure: INCISION AND DRAINAGE-Right Foot;  Surgeon: Silva Juliene SAUNDERS, DPM;  Location: [facility];  Service: Podiatry;  Laterality: Right;   IRRIGATION AND DEBRIDEMENT FOOT Right 11/09/2020   Procedure: IRRIGATION AND DEBRIDEMENT FOOT;  Surgeon: Janit Thresa HERO, DPM;  Location: [facility];  Service: Podiatry;  Laterality: Right;   IRRIGATION AND DEBRIDEMENT FOOT Right 12/13/2020   Procedure: IRRIGATION AND DEBRIDEMENT FOOT;  Surgeon: Silva Juliene SAUNDERS, DPM;  Location: [facility];  Service: Podiatry;  Laterality: Right;   TRANSMETATARSAL AMPUTATION Right 01/08/2021   Procedure: LISFRANC  AMPUTATION;  Surgeon: Gershon Donnice SAUNDERS, DPM;  Location: [facility];  Service: Podiatry;  Laterality: Right;   WOUND DEBRIDEMENT Right 12/16/2020   Procedure: DEBRIDEMENT WOUND;  Surgeon: Tobie Franky SQUIBB, DPM;  Location: [facility];  Service: Podiatry;  Laterality: Right;  Transmet amputation washout & closure   WOUND DEBRIDEMENT Right 01/08/2021   Procedure: DEBRIDEMENT WOUND;  Surgeon: Gershon Donnice SAUNDERS, DPM;  Location: [facility];  Service: Podiatry;  Laterality: Right;    Allergies  Allergen Reactions   Gramineae Pollens Other (See Comments)    Sinus     RT foot 11/13/2023  LT foot 11/13/2023  Objective/Physical Exam General: The patient is alert and oriented x3 in no acute distress.  Dermatology:  Wound #1 noted to the dorsum of the left foot measuring approximately 1.0 x 1.0 x 0.2 cm (LxWxD).  Well adhered eschar.  Does not probe to bone.  No drainage.  No indication of infection.  Small superficial wound also noted to the medial aspect of the right ankle which appears very stable with good potential for healing. Skin is warm, dry and supple bilateral lower extremities.  Vascular: US  ARTERIAL ABI  FINDINGS: Right ABI:  1.08 Left ABI:  1.06   Right Lower Extremity:  Normal arterial waveforms at the ankle. Left  Lower Extremity:  Normal arterial waveforms at the ankle.   1.0-1.4 Normal   IMPRESSION: Normal bilateral resting ankle-brachial indices.  Neurological: Diminished via light touch  Musculoskeletal Exam: History of prior Lisfranc amputation right foot  Assessment: 1.  Ulcer dorsum of the left foot secondary to diabetes mellitus 2. H/o right foot revisional Lisfranc amputation. DOS: 01/08/2021    Plan of Care:  -Patient was evaluated. -For now the wound appears very stable with good potential for healing.  Will simply observe for now.  If there is no improvement over the next 4 weeks we will order updated ABIs to evaluate circulation to the foot -In the meantime recommend conservative treatment including Betadine with dry dressing.  Provided today. -Return to clinic 4 weeks   Thresa EMERSON Sar, DPM [facility]  Dr. Thresa EMERSON Sar, DPM    [address] [place], [place] [address]                Office [phone]  Fax [phone]

## 2023-11-15 ENCOUNTER — Ambulatory Visit: Admitting: Sleep Medicine

## 2023-12-04 ENCOUNTER — Telehealth: Payer: Self-pay | Admitting: Podiatry

## 2023-12-04 NOTE — Telephone Encounter (Signed)
 Patient called stating he needed to be seen because his foot didn't look good. Per Dr Janit patient can come in this afternoon. Called pt 3x's at number he gave us  [phone]. No answer mailbox is not set up unable to leave a message.

## 2023-12-05 ENCOUNTER — Ambulatory Visit (INDEPENDENT_AMBULATORY_CARE_PROVIDER_SITE_OTHER)

## 2023-12-05 DIAGNOSIS — B351 Tinea unguium: Secondary | ICD-10-CM | POA: Diagnosis not present

## 2023-12-05 DIAGNOSIS — Z89431 Acquired absence of right foot: Secondary | ICD-10-CM | POA: Diagnosis not present

## 2023-12-05 DIAGNOSIS — L97421 Non-pressure chronic ulcer of left heel and midfoot limited to breakdown of skin: Secondary | ICD-10-CM | POA: Diagnosis not present

## 2023-12-05 DIAGNOSIS — E08621 Diabetes mellitus due to underlying condition with foot ulcer: Secondary | ICD-10-CM

## 2023-12-05 DIAGNOSIS — M79675 Pain in left toe(s): Secondary | ICD-10-CM

## 2023-12-05 MED ORDER — AMOXICILLIN-POT CLAVULANATE 875-125 MG PO TABS: 1.0000 | ORAL_TABLET | Freq: Two times a day (BID) | ORAL | 0 refills | Status: DC

## 2023-12-05 NOTE — Progress Notes (Signed)
 Chief Complaint  Patient presents with   Foot Pain    foot getting worse per pt-diabetic Dr Janit pt. His sneaker wore a sore on the top of his foot on the left. Amputee on the right.    Subjective:  53 y.o. male with PMHx of diabetes mellitus for ulcer to the dorsum of the left foot secondary to new 'high top' tennis shoes that the patient wore but it caused a friction sore to the dorsum of the foot.  He is not currently on antibiotics. He denies any systemic symptoms.    Past Medical History:  Diagnosis Date   Diabetes mellitus without complication (HCC)    Hypertension    Neuropathy    Sarcoidosis    Sleep apnea     Past Surgical History:  Procedure Laterality Date   AMPUTATION Right 12/13/2020   Procedure: AMPUTATION midFOOT-RIGHT;  Surgeon: Silva Juliene SAUNDERS, DPM;  Location: [facility];  Service: Podiatry;  Laterality: Right;   AMPUTATION Right 11/05/2020   Procedure: AMPUTATION RAY-Partial Ray Amp 2nd & 3rd Digit;  Surgeon: Janit Thresa HERO, DPM;  Location: [facility];  Service: Podiatry;  Laterality: Right;   APPLICATION OF WOUND VAC Right 11/09/2020   Procedure: APPLICATION OF WOUND VAC;  Surgeon: Janit Thresa HERO, DPM;  Location: [facility];  Service: Podiatry;  Laterality: Right;   BONE BIOPSY Right 01/08/2021   Procedure: BONE BIOPSY;  Surgeon: Gershon Donnice SAUNDERS, DPM;  Location: [facility];  Service: Podiatry;  Laterality: Right;   COLONOSCOPY WITH PROPOFOL  N/A 07/19/2021   Procedure: COLONOSCOPY WITH PROPOFOL ;  Surgeon: Therisa Bi, MD;  Location: [facility] ENDOSCOPY;  Service: Gastroenterology;  Laterality: N/A;   COLONOSCOPY WITH PROPOFOL  N/A 11/23/2021   Procedure: COLONOSCOPY WITH PROPOFOL ;  Surgeon: Therisa Bi, MD;  Location: [facility] ENDOSCOPY;  Service: Gastroenterology;  Laterality: N/A;   COLONOSCOPY WITH PROPOFOL  N/A 02/07/2023   Procedure: COLONOSCOPY WITH PROPOFOL ;  Surgeon: Therisa Bi, MD;  Location: [facility] ENDOSCOPY;  Service: Gastroenterology;  Laterality: N/A;   INCISION AND  DRAINAGE Right 11/01/2020   Procedure: INCISION AND DRAINAGE-Right Foot;  Surgeon: Silva Juliene SAUNDERS, DPM;  Location: [facility];  Service: Podiatry;  Laterality: Right;   IRRIGATION AND DEBRIDEMENT FOOT Right 11/09/2020   Procedure: IRRIGATION AND DEBRIDEMENT FOOT;  Surgeon: Janit Thresa HERO, DPM;  Location: [facility];  Service: Podiatry;  Laterality: Right;   IRRIGATION AND DEBRIDEMENT FOOT Right 12/13/2020   Procedure: IRRIGATION AND DEBRIDEMENT FOOT;  Surgeon: Silva Juliene SAUNDERS, DPM;  Location: [facility];  Service: Podiatry;  Laterality: Right;   TRANSMETATARSAL AMPUTATION Right 01/08/2021   Procedure: LISFRANC  AMPUTATION;  Surgeon: Gershon Donnice SAUNDERS, DPM;  Location: [facility];  Service: Podiatry;  Laterality: Right;   WOUND DEBRIDEMENT Right 12/16/2020   Procedure: DEBRIDEMENT WOUND;  Surgeon: Tobie Franky SQUIBB, DPM;  Location: [facility];  Service: Podiatry;  Laterality: Right;  Transmet amputation washout & closure   WOUND DEBRIDEMENT Right 01/08/2021   Procedure: DEBRIDEMENT WOUND;  Surgeon: Gershon Donnice SAUNDERS, DPM;  Location: [facility];  Service: Podiatry;  Laterality: Right;    Allergies  Allergen Reactions   Gramineae Pollens Other (See Comments)    Sinus     Left foot 12/05/23   Objective/Physical Exam General: The patient is alert and oriented x3 in no acute distress.  Dermatology:  Wound #1 noted to the dorsum of the left foot measuring approximately 1.0 x 1.0 x 0.2 cm (LxWxD).  Mixed fibrotic-eschar that is well adhered. Overlying EHL.   Does not probe to  bone.  No drainage.  Surrounding erythema consistent with chronic changes. No focal edema.   Right ankle small superficial wound is almost healed with small well adhered eschar measuring 0.2cm x 0.2cm remaining. No signs of infection.   Vascular: US  ARTERIAL ABI 12/12/2020 FINDINGS: Right ABI:  1.08 Left ABI:  1.06   Right Lower Extremity:  Normal arterial waveforms at the ankle. Left Lower Extremity:  Normal arterial waveforms at  the ankle.   1.0-1.4 Normal   IMPRESSION: Normal bilateral resting ankle-brachial indices.  Neurological: Diminished via light touch  Musculoskeletal Exam: History of prior Lisfranc amputation right foot  Assessment: 1.  Ulcer dorsum of the left foot secondary to diabetes mellitus 2. H/o right foot revisional Lisfranc amputation. DOS: 01/08/2021    Plan of Care:  -Patient was evaluated. -For now the wound appears stable with good potential for healing.  He has not had significant improvement since he was last seen 3 weeks ago. His last ABI was done in 2022. I can feel his DP pulse but am still concerned about vascular supply given his history of contralateral amputation and slow healing ulceration. I placed an order for a new ABI.  - Rx for augmentin  875 mg BID x 7 days placed due to minimal improvement and continued surrounding erythema.  -In the meantime recommend conservative treatment including Betadine with dry dressing.  Provided today. -He is scheduled to be back in clinic with Dr. Janit in 1 week   Prentice Ovens, DPM [facility]  Dr. Thresa EMERSON Janit, DPM    [address] [place], [place] [address]                Office [phone]  Fax [phone]

## 2023-12-11 ENCOUNTER — Ambulatory Visit (INDEPENDENT_AMBULATORY_CARE_PROVIDER_SITE_OTHER): Admitting: Podiatry

## 2023-12-11 ENCOUNTER — Encounter: Payer: Self-pay | Admitting: Podiatry

## 2023-12-11 VITALS — Ht 67.0 in | Wt 204.2 lb

## 2023-12-11 DIAGNOSIS — L97421 Non-pressure chronic ulcer of left heel and midfoot limited to breakdown of skin: Secondary | ICD-10-CM | POA: Diagnosis not present

## 2023-12-11 DIAGNOSIS — E08621 Diabetes mellitus due to underlying condition with foot ulcer: Secondary | ICD-10-CM

## 2023-12-11 NOTE — Progress Notes (Signed)
 Chief Complaint  Patient presents with   Wound Check    Pt is here to f/u on left foot wound, he states that it looks and feels better, area is still red and has some drainage.    Subjective:  53 y.o. male with PMHx of diabetes mellitus for follow up evaluation of an ulcer to the dorsum of the left foot secondary to new 'high top' tennis shoes that the patient wore but it caused a friction sore to the dorsum of the foot.     Past Medical History:  Diagnosis Date   Diabetes mellitus without complication (HCC)    Hypertension    Neuropathy    Sarcoidosis    Sleep apnea     Past Surgical History:  Procedure Laterality Date   AMPUTATION Right 12/13/2020   Procedure: AMPUTATION midFOOT-RIGHT;  Surgeon: Silva Juliene SAUNDERS, DPM;  Location: [facility];  Service: Podiatry;  Laterality: Right;   AMPUTATION Right 11/05/2020   Procedure: AMPUTATION RAY-Partial Ray Amp 2nd & 3rd Digit;  Surgeon: Janit Thresa HERO, DPM;  Location: [facility];  Service: Podiatry;  Laterality: Right;   APPLICATION OF WOUND VAC Right 11/09/2020   Procedure: APPLICATION OF WOUND VAC;  Surgeon: Janit Thresa HERO, DPM;  Location: [facility];  Service: Podiatry;  Laterality: Right;   BONE BIOPSY Right 01/08/2021   Procedure: BONE BIOPSY;  Surgeon: Gershon Donnice SAUNDERS, DPM;  Location: [facility];  Service: Podiatry;  Laterality: Right;   COLONOSCOPY WITH PROPOFOL  N/A 07/19/2021   Procedure: COLONOSCOPY WITH PROPOFOL ;  Surgeon: Therisa Bi, MD;  Location: [facility] ENDOSCOPY;  Service: Gastroenterology;  Laterality: N/A;   COLONOSCOPY WITH PROPOFOL  N/A 11/23/2021   Procedure: COLONOSCOPY WITH PROPOFOL ;  Surgeon: Therisa Bi, MD;  Location: [facility] ENDOSCOPY;  Service: Gastroenterology;  Laterality: N/A;   COLONOSCOPY WITH PROPOFOL  N/A 02/07/2023   Procedure: COLONOSCOPY WITH PROPOFOL ;  Surgeon: Therisa Bi, MD;  Location: [facility] ENDOSCOPY;  Service: Gastroenterology;  Laterality: N/A;   INCISION AND DRAINAGE Right 11/01/2020   Procedure: INCISION  AND DRAINAGE-Right Foot;  Surgeon: Silva Juliene SAUNDERS, DPM;  Location: [facility];  Service: Podiatry;  Laterality: Right;   IRRIGATION AND DEBRIDEMENT FOOT Right 11/09/2020   Procedure: IRRIGATION AND DEBRIDEMENT FOOT;  Surgeon: Janit Thresa HERO, DPM;  Location: [facility];  Service: Podiatry;  Laterality: Right;   IRRIGATION AND DEBRIDEMENT FOOT Right 12/13/2020   Procedure: IRRIGATION AND DEBRIDEMENT FOOT;  Surgeon: Silva Juliene SAUNDERS, DPM;  Location: [facility];  Service: Podiatry;  Laterality: Right;   TRANSMETATARSAL AMPUTATION Right 01/08/2021   Procedure: LISFRANC  AMPUTATION;  Surgeon: Gershon Donnice SAUNDERS, DPM;  Location: [facility];  Service: Podiatry;  Laterality: Right;   WOUND DEBRIDEMENT Right 12/16/2020   Procedure: DEBRIDEMENT WOUND;  Surgeon: Tobie Franky SQUIBB, DPM;  Location: [facility];  Service: Podiatry;  Laterality: Right;  Transmet amputation washout & closure   WOUND DEBRIDEMENT Right 01/08/2021   Procedure: DEBRIDEMENT WOUND;  Surgeon: Gershon Donnice SAUNDERS, DPM;  Location: [facility];  Service: Podiatry;  Laterality: Right;    Allergies  Allergen Reactions   Gramineae Pollens Other (See Comments)    Sinus     Left foot 12/05/23  LT foot 11/13/2023  Objective/Physical Exam General: The patient is alert and oriented x3 in no acute distress.  Dermatology:  The ulcer to the dorsum of the foot is significantly improved and stable.  No drainage.  No surrounding erythema.  Wound #1 noted to the dorsum of the left foot measuring approximately 1.0 x 1.0 x 0.2  cm (LxWxD).  Well adhered eschar noted.  Right ankle wound has healed  Vascular: US  ARTERIAL ABI 12/12/2020 FINDINGS: Right ABI:  1.08 Left ABI:  1.06   Right Lower Extremity:  Normal arterial waveforms at the ankle. Left Lower Extremity:  Normal arterial waveforms at the ankle.   1.0-1.4 Normal   IMPRESSION: Normal bilateral resting ankle-brachial indices.  Neurological: Diminished via light touch  Musculoskeletal Exam: History  of prior Lisfranc amputation right foot  Assessment: 1.  Ulcer dorsum of the left foot secondary to diabetes mellitus 2. H/o right foot revisional Lisfranc amputation. DOS: 01/08/2021    Plan of Care:  -Patient was evaluated.  Overall significant improvement - Continue augmentin  875 mg BID x 7 days until completed -Continue Betadine wet-to-dry dressings daily -Return to clinic 3 weeks   Prentice Ovens, DPM [facility]  Dr. Thresa EMERSON Sar, DPM    [address] [place], [place] [address]                Office [phone]  Fax [phone]

## 2023-12-19 ENCOUNTER — Ambulatory Visit (INDEPENDENT_AMBULATORY_CARE_PROVIDER_SITE_OTHER)

## 2023-12-19 DIAGNOSIS — L97421 Non-pressure chronic ulcer of left heel and midfoot limited to breakdown of skin: Secondary | ICD-10-CM | POA: Diagnosis not present

## 2023-12-19 DIAGNOSIS — E08621 Diabetes mellitus due to underlying condition with foot ulcer: Secondary | ICD-10-CM | POA: Diagnosis not present

## 2024-01-08 ENCOUNTER — Ambulatory Visit: Admitting: Podiatry

## 2024-01-15 ENCOUNTER — Encounter: Payer: Self-pay | Admitting: Podiatry

## 2024-01-15 ENCOUNTER — Ambulatory Visit (INDEPENDENT_AMBULATORY_CARE_PROVIDER_SITE_OTHER): Admitting: Podiatry

## 2024-01-15 VITALS — Ht 67.0 in | Wt 204.2 lb

## 2024-01-15 DIAGNOSIS — E08621 Diabetes mellitus due to underlying condition with foot ulcer: Secondary | ICD-10-CM

## 2024-01-15 DIAGNOSIS — B351 Tinea unguium: Secondary | ICD-10-CM

## 2024-01-15 DIAGNOSIS — L97522 Non-pressure chronic ulcer of other part of left foot with fat layer exposed: Secondary | ICD-10-CM | POA: Diagnosis not present

## 2024-01-15 DIAGNOSIS — M79675 Pain in left toe(s): Secondary | ICD-10-CM | POA: Diagnosis not present

## 2024-01-15 NOTE — Progress Notes (Signed)
 Chief Complaint  Patient presents with   Diabetic Ulcer    Pt is here to f/u on left midfoot due to diabetic ulcer, he states everything is going well, still keeping area clean and bandage.    Subjective:  53 y.o. male with PMHx of diabetes mellitus for follow up evaluation of an ulcer to the dorsum of the left foot secondary to new 'high top' tennis shoes that the patient wore but it caused a friction sore to the dorsum of the foot.     Past Medical History:  Diagnosis Date   Diabetes mellitus without complication (HCC)    Hypertension    Neuropathy    Sarcoidosis    Sleep apnea     Past Surgical History:  Procedure Laterality Date   AMPUTATION Right 12/13/2020   Procedure: AMPUTATION midFOOT-RIGHT;  Surgeon: Silva Juliene SAUNDERS, DPM;  Location: [facility];  Service: Podiatry;  Laterality: Right;   AMPUTATION Right 11/05/2020   Procedure: AMPUTATION RAY-Partial Ray Amp 2nd & 3rd Digit;  Surgeon: Janit Thresa HERO, DPM;  Location: [facility];  Service: Podiatry;  Laterality: Right;   APPLICATION OF WOUND VAC Right 11/09/2020   Procedure: APPLICATION OF WOUND VAC;  Surgeon: Janit Thresa HERO, DPM;  Location: [facility];  Service: Podiatry;  Laterality: Right;   BONE BIOPSY Right 01/08/2021   Procedure: BONE BIOPSY;  Surgeon: Gershon Donnice SAUNDERS, DPM;  Location: [facility];  Service: Podiatry;  Laterality: Right;   COLONOSCOPY WITH PROPOFOL  N/A 07/19/2021   Procedure: COLONOSCOPY WITH PROPOFOL ;  Surgeon: Therisa Bi, MD;  Location: [facility] ENDOSCOPY;  Service: Gastroenterology;  Laterality: N/A;   COLONOSCOPY WITH PROPOFOL  N/A 11/23/2021   Procedure: COLONOSCOPY WITH PROPOFOL ;  Surgeon: Therisa Bi, MD;  Location: [facility] ENDOSCOPY;  Service: Gastroenterology;  Laterality: N/A;   COLONOSCOPY WITH PROPOFOL  N/A 02/07/2023   Procedure: COLONOSCOPY WITH PROPOFOL ;  Surgeon: Therisa Bi, MD;  Location: [facility] ENDOSCOPY;  Service: Gastroenterology;  Laterality: N/A;   INCISION AND DRAINAGE Right 11/01/2020    Procedure: INCISION AND DRAINAGE-Right Foot;  Surgeon: Silva Juliene SAUNDERS, DPM;  Location: [facility];  Service: Podiatry;  Laterality: Right;   IRRIGATION AND DEBRIDEMENT FOOT Right 11/09/2020   Procedure: IRRIGATION AND DEBRIDEMENT FOOT;  Surgeon: Janit Thresa HERO, DPM;  Location: [facility];  Service: Podiatry;  Laterality: Right;   IRRIGATION AND DEBRIDEMENT FOOT Right 12/13/2020   Procedure: IRRIGATION AND DEBRIDEMENT FOOT;  Surgeon: Silva Juliene SAUNDERS, DPM;  Location: [facility];  Service: Podiatry;  Laterality: Right;   TRANSMETATARSAL AMPUTATION Right 01/08/2021   Procedure: LISFRANC  AMPUTATION;  Surgeon: Gershon Donnice SAUNDERS, DPM;  Location: [facility];  Service: Podiatry;  Laterality: Right;   WOUND DEBRIDEMENT Right 12/16/2020   Procedure: DEBRIDEMENT WOUND;  Surgeon: Tobie Franky SQUIBB, DPM;  Location: [facility];  Service: Podiatry;  Laterality: Right;  Transmet amputation washout & closure   WOUND DEBRIDEMENT Right 01/08/2021   Procedure: DEBRIDEMENT WOUND;  Surgeon: Gershon Donnice SAUNDERS, DPM;  Location: [facility];  Service: Podiatry;  Laterality: Right;    Allergies  Allergen Reactions   Gramineae Pollens Other (See Comments)    Sinus     LT foot 11/13/2023   LT foot 01/15/2024  Objective/Physical Exam General: The patient is alert and oriented x3 in no acute distress.  Dermatology:  The ulcer to the dorsum of the foot is stable.  No drainage.  No surrounding erythema.  Wound #1 noted to the dorsum of the left foot measuring approximately 1.0 x 1.0 x 0.2 cm (LxWxD).  No eschar.  Granular wound base with intermixed fibrotic tissue.  There is no exposed bone muscle tendon ligament or joint.  No odor.  No appreciable drainage.  Right ankle wound has healed  Vascular: US  ARTERIAL ABI 12/12/2020 FINDINGS: Right ABI:  1.08 Left ABI:  1.06   Right Lower Extremity:  Normal arterial waveforms at the ankle. Left Lower Extremity:  Normal arterial waveforms at the ankle.   1.0-1.4 Normal    IMPRESSION: Normal bilateral resting ankle-brachial indices.  Neurological: Diminished via light touch  Musculoskeletal Exam: History of prior Lisfranc amputation right foot  Assessment: 1.  Ulcer dorsum of the left foot secondary to diabetes mellitus 2. H/o right foot revisional Lisfranc amputation. DOS: 01/08/2021  3.  Pain due to onychomycosis of toenails both  Plan of Care:  -Patient was evaluated.  Wound stable -Medically necessary excisional debridement including subcutaneous tissue was performed today using a tissue nipper.  Excisional debridement of the necrotic nonviable tissue down to healthier bleeding viable tissue was performed with postdebridement measurements same as pre- -Continue Betadine with dry dressings daily -Mechanical debridement of nails 1-5 left foot performed using a nail nipper without incident or bleeding -Return to clinic 4 weeks  Prentice Ovens, DPM [facility]  Dr. Thresa EMERSON Sar, DPM    [address] [place], [place] [address]                Office [phone]  Fax [phone]

## 2024-02-04 ENCOUNTER — Encounter: Payer: Self-pay | Admitting: Emergency Medicine

## 2024-02-04 ENCOUNTER — Emergency Department
Admission: EM | Admit: 2024-02-04 | Discharge: 2024-02-04 | Disposition: A | Discharge status: Home / Self Care | Attending: Emergency Medicine | Admitting: Emergency Medicine

## 2024-02-04 ENCOUNTER — Other Ambulatory Visit: Payer: Self-pay

## 2024-02-04 ENCOUNTER — Emergency Department

## 2024-02-04 DIAGNOSIS — N3000 Acute cystitis without hematuria: Secondary | ICD-10-CM | POA: Insufficient documentation

## 2024-02-04 DIAGNOSIS — I10 Essential (primary) hypertension: Secondary | ICD-10-CM | POA: Diagnosis not present

## 2024-02-04 DIAGNOSIS — N2889 Other specified disorders of kidney and ureter: Secondary | ICD-10-CM | POA: Diagnosis not present

## 2024-02-04 DIAGNOSIS — R1030 Lower abdominal pain, unspecified: Secondary | ICD-10-CM | POA: Diagnosis present

## 2024-02-04 DIAGNOSIS — E119 Type 2 diabetes mellitus without complications: Secondary | ICD-10-CM | POA: Diagnosis not present

## 2024-02-04 DIAGNOSIS — E1142 Type 2 diabetes mellitus with diabetic polyneuropathy: Secondary | ICD-10-CM | POA: Insufficient documentation

## 2024-02-04 LAB — COMPREHENSIVE METABOLIC PANEL WITH GFR
ALT: 18 U/L (ref 0–44)
AST: 18 U/L (ref 15–41)
Albumin: 4.2 g/dL (ref 3.5–5.0)
Alkaline Phosphatase: 72 U/L (ref 38–126)
Anion gap: 10 (ref 5–15)
BUN: 18 mg/dL (ref 6–20)
CO2: 25 mmol/L (ref 22–32)
Calcium: 8.7 mg/dL — ABNORMAL LOW (ref 8.9–10.3)
Chloride: 98 mmol/L (ref 98–111)
Creatinine, Ser: 0.84 mg/dL (ref 0.61–1.24)
GFR, Estimated: >60 mL/min (ref >60)
Glucose, Bld: 293 mg/dL — ABNORMAL HIGH (ref 70–99)
Potassium: 4.2 mmol/L (ref 3.5–5.1)
Sodium: 133 mmol/L — ABNORMAL LOW (ref 135–145)
Total Bilirubin: 0.5 mg/dL (ref 0.0–1.2)
Total Protein: 6.8 g/dL (ref 6.5–8.1)

## 2024-02-04 LAB — CBC WITH DIFFERENTIAL/PLATELET
Abs Immature Granulocytes: 0.03 K/uL (ref 0.00–0.07)
Basophils Absolute: 0 K/uL (ref 0.0–0.1)
Basophils Relative: 0 %
Eosinophils Absolute: 0.2 K/uL (ref 0.0–0.5)
Eosinophils Relative: 2 %
HCT: 45.6 % (ref 39.0–52.0)
Hemoglobin: 14.8 g/dL (ref 13.0–17.0)
Immature Granulocytes: 0 %
Lymphocytes Relative: 29 %
Lymphs Abs: 2.1 K/uL (ref 0.7–4.0)
MCH: 30.1 pg (ref 26.0–34.0)
MCHC: 32.5 g/dL (ref 30.0–36.0)
MCV: 92.9 fL (ref 80.0–100.0)
Monocytes Absolute: 0.7 K/uL (ref 0.1–1.0)
Monocytes Relative: 9 %
Neutro Abs: 4.3 K/uL (ref 1.7–7.7)
Neutrophils Relative %: 60 %
Platelets: 235 K/uL (ref 150–400)
RBC: 4.91 MIL/uL (ref 4.22–5.81)
RDW: 11.7 % (ref 11.5–15.5)
WBC: 7.3 K/uL (ref 4.0–10.5)
nRBC: 0 % (ref 0.0–0.2)

## 2024-02-04 LAB — CHLAMYDIA/NGC RT PCR (ARMC ONLY)
Chlamydia Tr: NOT DETECTED
N gonorrhoeae: NOT DETECTED

## 2024-02-04 LAB — URINALYSIS, ROUTINE W REFLEX MICROSCOPIC
Bilirubin Urine: NEGATIVE
Glucose, UA: >=500 mg/dL — AB
Ketones, ur: NEGATIVE mg/dL
Nitrite: NEGATIVE
Protein, ur: 30 mg/dL — AB
Specific Gravity, Urine: 1.023 (ref 1.005–1.030)
Squamous Epithelial / HPF: 0 /HPF (ref 0–5)
WBC, UA: >50 WBC/hpf (ref 0–5)
pH: 6 (ref 5.0–8.0)

## 2024-02-04 LAB — LIPASE, BLOOD: Lipase: 61 U/L — ABNORMAL HIGH (ref 11–51)

## 2024-02-04 MED ORDER — IOHEXOL 300 MG/ML  SOLN
100.0000 mL | Freq: Once | INTRAMUSCULAR | Status: AC | PRN
  Administered 2024-02-04: 100 mL via INTRAVENOUS

## 2024-02-04 MED ORDER — CEFDINIR 300 MG PO CAPS: 300.0000 mg | ORAL_CAPSULE | Freq: Two times a day (BID) | ORAL | 0 refills | Status: AC

## 2024-02-04 MED ORDER — SODIUM CHLORIDE 0.9 % IV SOLN
1.0000 g | INTRAVENOUS | Status: DC
  Administered 2024-02-04: 1 g via INTRAVENOUS
  Filled 2024-02-04: qty 10

## 2024-02-04 NOTE — ED Triage Notes (Signed)
 C/O left flank pain x 2 days.  Denies dysuria.  AAOx3. Skin warm and dry. NAD

## 2024-02-04 NOTE — ED Provider Notes (Signed)
 [facility] Provider Note    Event Date/Time   First MD Initiated Contact with Patient 02/04/24 [phone]     (approximate)   History   Flank Pain   HPI  Fernando Vega is a 53 y.o. male with a past medical history of type 2 diabetes, sarcoidosis, hypertension who presents today for evaluation of lower abdominal pain that began 3 to 4 days ago.  He also reports that he has pain that radiates to his left flank area.  He reports that he feels constipated but has not had any urinary symptoms.  No blood in his stool.  No chest pain or shortness of breath.  No fevers or chills.  No history of kidney stones.  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) 01/07/2021   Infection of right TMA stump (HCC) 01/07/2021   Acute osteomyelitis of right foot (HCC) 01/07/2021   Essential hypertension 12/30/2020   Sleep apnea 12/11/2020   Anemia 12/11/2020   Osteomyelitis (HCC) 12/11/2020   Encounter to establish care 12/02/2020   Leukocytosis    Puncture wound 11/01/2020   Type 2 diabetes mellitus with peripheral neuropathy (HCC) 11/01/2020   Necrotizing soft tissue infection 11/01/2020   Diabetic foot infection [facility])           Physical Exam   Triage Vital Signs: ED Triage Vitals  Encounter Vitals Group     BP 02/04/24 0754 (!) 163/94     Girls Systolic BP Percentile --      Girls Diastolic BP Percentile --      Boys Systolic BP Percentile --      Boys Diastolic BP Percentile --      Pulse Rate 02/04/24 0754 97     Resp 02/04/24 0754 16     Temp 02/04/24 0754 97.8 F (36.6 C)     Temp Source 02/04/24 0754 Oral     SpO2 02/04/24 0754 99 %     Weight 02/04/24 0755 204 lb 2.3 oz (92.6 kg)     Height  --      Head Circumference --      Peak Flow --      Pain Score 02/04/24 0755 8     Pain Loc --      Pain Education --      Exclude from Growth Chart --     Most recent vital signs: Vitals:   02/04/24 0754  BP: (!) 163/94  Pulse: 97  Resp: 16  Temp: 97.8 F (36.6 C)  SpO2: 99%    Physical Exam Vitals and nursing note reviewed.  Constitutional:      General: Awake and alert. No acute distress.    Appearance: Normal appearance.  HENT:     Head: Normocephalic and atraumatic.     Mouth: Mucous membranes are moist.  Eyes:     General: PERRL. Normal EOMs        Right eye: No discharge.        Left eye: No discharge.     Conjunctiva/sclera: Conjunctivae normal.  Cardiovascular:     Rate and Rhythm: Normal rate and regular rhythm.     Pulses: Normal pulses.  Equal in all 4 extremities Pulmonary:  Effort: Pulmonary effort is normal. No respiratory distress.     Breath sounds: Normal breath sounds.  Abdominal:     Abdomen is soft. There is mild low abdominal tenderness. No rebound or guarding. No distention.  No CVA tenderness. Musculoskeletal:        General: No swelling. Normal range of motion.     Cervical back: Normal range of motion and neck supple.  No midline lumbar or thoracic tenderness.  Normal strength and sensation in bilateral lower extremities. There is a TMA to the right foot Scab noted to the dorsum of the left foot without surrounding erythema. Skin:    General: Skin is warm and dry.     Capillary Refill: Capillary refill takes less than 2 seconds.     Findings: No rash.  Neurological:     Mental Status: The patient is awake and alert.      ED Results / Procedures / Treatments   Labs (all labs ordered are listed, but only abnormal results are displayed) Labs Reviewed  COMPREHENSIVE METABOLIC PANEL WITH GFR - Abnormal; Notable for the following components:      Result Value   Sodium 133 (*)    Glucose, Bld 293 (*)    Calcium  8.7 (*)    All  other components within normal limits  LIPASE, BLOOD - Abnormal; Notable for the following components:   Lipase 61 (*)    All other components within normal limits  URINALYSIS, ROUTINE W REFLEX MICROSCOPIC - Abnormal; Notable for the following components:   Color, Urine STRAW (*)    APPearance HAZY (*)    Glucose, UA >=500 (*)    Hgb urine dipstick SMALL (*)    Protein, ur 30 (*)    Leukocytes,Ua SMALL (*)    Bacteria, UA RARE (*)    All other components within normal limits  CHLAMYDIA/NGC RT PCR ([facility] ONLY)            URINE CULTURE  CBC WITH DIFFERENTIAL/PLATELET     EKG     RADIOLOGY I independently reviewed and interpreted imaging and agree with radiologists findings.     PROCEDURES:  Critical Care performed:   Procedures   MEDICATIONS ORDERED IN ED: Medications  cefTRIAXone  (ROCEPHIN ) 1 g in sodium chloride  0.9 % 100 mL IVPB (0 g Intravenous Stopped 02/04/24 1104)  iohexol  (OMNIPAQUE ) 300 MG/ML solution 100 mL (100 mLs Intravenous Contrast Given 02/04/24 0923)     IMPRESSION / MDM / ASSESSMENT AND PLAN / ED COURSE  I reviewed the triage vital signs and the nursing notes.   Differential diagnosis includes, but is not limited to, diverticulitis, urinary tract infection, pyelonephritis, nephrolithiasis.  Patient is awake and alert, hemodynamically stable and afebrile.  He is nontoxic in appearance.  No chest pain, shortness of breath, back pain to suggest dissection, and his pulses are equal in all 4 extremities.  There is no evidence of infection on exam.  Further workup is indicated.  Labs and urine obtained.  Urine is suggestive of infection with leukocytes and greater than 50 WBCs.  No fever or CVA tenderness or radiographical evidence of pyelonephritis, though will give a dose of Rocephin  here.  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  I consulted urology, Dr. Twylla, who does not feel that anything needs to be done in the emergency department today regarding this  malignancy, though he agrees with plan for Rocephin  for infection while in the ED, and p.o. antibiotics upon discharge.  He is office will help to arrange prompt follow-up.  I discussed these findings with the patient, and emphasized the need for prompt follow-up given that this is a possible malignancy and patient understands and agrees.  He plans to call today or tomorrow.  We discussed strict return precautions in the meantime.  Patient understands and agrees with plan.  He was discharged in stable condition.   Patient's presentation is most consistent with acute complicated illness / injury requiring diagnostic workup.   Clinical Course as of 02/04/24 1150  Mon Feb 04, 2024  0958 Dr. Twylla with urology consulted [JP]  1023 Per urology, treat infection now, and they will arrange follow-up as an outpatient for the possible malignancy [JP]    Clinical Course User Index [JP] Amoni Morales E, PA-C     FINAL CLINICAL IMPRESSION(S) / ED DIAGNOSES   Final diagnoses:  Renal mass, left  Acute cystitis without hematuria     Rx / DC Orders   ED Discharge Orders          Ordered    cefdinir  (OMNICEF ) 300 MG capsule  2 times daily        02/04/24 1059             Note:  This document was prepared using Dragon voice recognition software and may include unintentional dictation errors.   Torri Langston E, PA-C 02/04/24 1150    Waymond Lorelle Cummins, MD 02/04/24 [phone]

## 2024-02-04 NOTE — Discharge Instructions (Signed)
 It appears that you have a urinary tract infection.  You are given a dose of IV antibiotics in the emergency department.  Please take the oral antibiotics as prescribed for the next 10 days.  As we discussed it is also very important that you follow-up with urology for the kidney mass that we discussed.  Very prompt follow-up is highly recommended in case this is a malignancy.  Please call the phone number provided.  Please return for any new, worsening, or changing symptoms or other concerns.

## 2024-02-06 LAB — URINE CULTURE: Culture: >=100000 — AB

## 2024-02-11 DIAGNOSIS — N2889 Other specified disorders of kidney and ureter: Secondary | ICD-10-CM | POA: Insufficient documentation

## 2024-02-11 NOTE — Progress Notes (Unsigned)
 02/15/2024 9:12 AM   Fernando Vega 01-11-1971 [id number]  Reason for visit: Follow up Left renal mass   HPI:  53 y.o. male, initial visit with me today for left renal mass  ED visit (02/04/2024)-nonspecific left abdominal pain  - CT A/P (02/04/24) = 1.5 cm enhancing solid left renal mass at upper pole, medial and posterior.  Two left renal artery, early branching left renal vein.  Morphologically normal contralateral right kidney.  -Also diagnosed with Klebsiella UTI -completed Augmentin  antibiotics  Medical history significant for sarcoidosis, severe sleep apnea, chronic cough, DM2, SOB, prior right foot amputation secondary to diabetes, neuropathy Hemoglobin A1c ranging from 7-12.3 (most recently 7.0 in June 2024)  Previously seen by [facility] and Dr. Twylla in 2020 05-2022  - History of BPH LUTS on Uroxatrol, ED on tadalafil   No family history of kidney cancer Family history of metastatic prostate cancer in father      Physical Exam: BP (!) 168/90   Pulse 94   Ht 5' 7 (1.702 m)   Wt 210 lb 3.2 oz (95.3 kg)   BMI 32.92 kg/m    General: no acute distress, alert/oriented, conversational  HEENT: equal nondilated pupils CV: regular rate Lung: unlabored breathing, regular rate and rhythm  Abd: nondistended, nontender with palpation, no palpable masses  MSK: moving all extremities without issue, normal observed motor function  Laboratory Data:  Latest Reference Range & Units 02/04/24 08:08  Creatinine 0.61 - 1.24 mg/dL 9.15    Pertinent Imaging: I have personally viewed and interpreted the CT A/P (02/04/24) = 1.5 cm enhancing solid left renal mass at upper pole, medial and posterior.  Two left renal artery, early branching left renal vein.  Morphologically normal contralateral right kidney..    Assessment & Plan:    Renal mass Assessment & Plan: 1.5 cm enhancing solid left renal mass, upper/medial/posterior - 2A, 1 early branching vein  - significant  medical comorbidity, uncontrolled DM  Reviewed his clinical history and recent CT imaging.  He does appear to have a 1.5 cm enhancing small renal mass within the medial upper pole, concerning for underlying renal cell carcinoma.  While concerning, the mass is small and if malignant likely harbors low metastatic potential.  Need to weigh management in the context of his overall health and significant comorbidity.  Further, the mass is in a technically challenging location within the medial posterior side just cephalad to a complex renal hilum.  Unfortunately, I think this would also be a challenging location for ablative treatment as well due to proximity to hilum/heat sink.  I would recommend initial active surveillance with interval CT imaging and 4-6 months, assess growth kinetics and tumor characteristics.   - Initial active surveillance-plan for interval CT renal mass protocol in 6 months  Orders: -     CT ABDOMEN PELVIS W WO CONTRAST; Future  Encounter for screening prostate specific antigen (PSA) measurement Assessment & Plan: PSA 0.5 (2023) -although I do not see these records +Fhx metastatic prostate cancer in father  - Submit PSA today for routine surveillance  Orders: -     PSA; Future  Benign prostatic hyperplasia, unspecified whether lower urinary tract symptoms present Assessment & Plan: Chronic BPH LUTS  -Uncontrolled diabetes  - On Uroxatrol  -Recent Klebsiella UTI   Briefly reviewed his history although not primary reason for visit today.  I will have him return in about 4 weeks for symptom check, PVR, follow-up with PA Vaillancourt.  Penne JONELLE Skye, MD  [facility] [address] [place], [place] [address] [phone]

## 2024-02-11 NOTE — Assessment & Plan Note (Signed)
 1.5 cm enhancing solid left renal mass, upper/medial/posterior - 2A, 1 early branching vein  - significant medical comorbidity, uncontrolled DM  Reviewed his clinical history and recent CT imaging.  He does appear to have a 1.5 cm enhancing small renal mass within the medial upper pole, concerning for underlying renal cell carcinoma.  While concerning, the mass is small and if malignant likely harbors low metastatic potential.  Need to weigh management in the context of his overall health and significant comorbidity.  Further, the mass is in a technically challenging location within the medial posterior side just cephalad to a complex renal hilum.  Unfortunately, I think this would also be a challenging location for ablative treatment as well due to proximity to hilum/heat sink.  I would recommend initial active surveillance with interval CT imaging and 4-6 months, assess growth kinetics and tumor characteristics.   - Initial active surveillance-plan for interval CT renal mass protocol in 6 months

## 2024-02-12 ENCOUNTER — Encounter: Payer: Self-pay | Admitting: Podiatry

## 2024-02-12 ENCOUNTER — Ambulatory Visit: Admitting: Podiatry

## 2024-02-12 VITALS — Ht 67.0 in | Wt 204.2 lb

## 2024-02-12 DIAGNOSIS — L97522 Non-pressure chronic ulcer of other part of left foot with fat layer exposed: Secondary | ICD-10-CM

## 2024-02-12 DIAGNOSIS — E08621 Diabetes mellitus due to underlying condition with foot ulcer: Secondary | ICD-10-CM

## 2024-02-12 NOTE — Progress Notes (Signed)
 Chief Complaint  Patient presents with   Diabetic Ulcer    Pt is here to f/u on diabetic foot ulcer on the left foot, he states he has no pain or drainage from the site, believes it is almost healed. No other complaints.    Subjective:  53 y.o. male with PMHx of diabetes mellitus for follow up evaluation of an ulcer to the dorsum of the left foot secondary to new 'high top' tennis shoes that the patient wore but it caused a friction sore to the dorsum of the foot.     Past Medical History:  Diagnosis Date   Diabetes mellitus without complication (HCC)    Hypertension    Neuropathy    Sarcoidosis    Sleep apnea     Past Surgical History:  Procedure Laterality Date   AMPUTATION Right 12/13/2020   Procedure: AMPUTATION midFOOT-RIGHT;  Surgeon: Silva Juliene SAUNDERS, DPM;  Location: [facility];  Service: Podiatry;  Laterality: Right;   AMPUTATION Right 11/05/2020   Procedure: AMPUTATION RAY-Partial Ray Amp 2nd & 3rd Digit;  Surgeon: Janit Thresa HERO, DPM;  Location: [facility];  Service: Podiatry;  Laterality: Right;   APPLICATION OF WOUND VAC Right 11/09/2020   Procedure: APPLICATION OF WOUND VAC;  Surgeon: Janit Thresa HERO, DPM;  Location: [facility];  Service: Podiatry;  Laterality: Right;   BONE BIOPSY Right 01/08/2021   Procedure: BONE BIOPSY;  Surgeon: Gershon Donnice SAUNDERS, DPM;  Location: [facility];  Service: Podiatry;  Laterality: Right;   COLONOSCOPY WITH PROPOFOL  N/A 07/19/2021   Procedure: COLONOSCOPY WITH PROPOFOL ;  Surgeon: Therisa Bi, MD;  Location: [facility] ENDOSCOPY;  Service: Gastroenterology;  Laterality: N/A;   COLONOSCOPY WITH PROPOFOL  N/A 11/23/2021   Procedure: COLONOSCOPY WITH PROPOFOL ;  Surgeon: Therisa Bi, MD;  Location: [facility] ENDOSCOPY;  Service: Gastroenterology;  Laterality: N/A;   COLONOSCOPY WITH PROPOFOL  N/A 02/07/2023   Procedure: COLONOSCOPY WITH PROPOFOL ;  Surgeon: Therisa Bi, MD;  Location: [facility] ENDOSCOPY;  Service: Gastroenterology;  Laterality: N/A;   INCISION AND  DRAINAGE Right 11/01/2020   Procedure: INCISION AND DRAINAGE-Right Foot;  Surgeon: Silva Juliene SAUNDERS, DPM;  Location: [facility];  Service: Podiatry;  Laterality: Right;   IRRIGATION AND DEBRIDEMENT FOOT Right 11/09/2020   Procedure: IRRIGATION AND DEBRIDEMENT FOOT;  Surgeon: Janit Thresa HERO, DPM;  Location: [facility];  Service: Podiatry;  Laterality: Right;   IRRIGATION AND DEBRIDEMENT FOOT Right 12/13/2020   Procedure: IRRIGATION AND DEBRIDEMENT FOOT;  Surgeon: Silva Juliene SAUNDERS, DPM;  Location: [facility];  Service: Podiatry;  Laterality: Right;   TRANSMETATARSAL AMPUTATION Right 01/08/2021   Procedure: LISFRANC  AMPUTATION;  Surgeon: Gershon Donnice SAUNDERS, DPM;  Location: [facility];  Service: Podiatry;  Laterality: Right;   WOUND DEBRIDEMENT Right 12/16/2020   Procedure: DEBRIDEMENT WOUND;  Surgeon: Tobie Franky SQUIBB, DPM;  Location: [facility];  Service: Podiatry;  Laterality: Right;  Transmet amputation washout & closure   WOUND DEBRIDEMENT Right 01/08/2021   Procedure: DEBRIDEMENT WOUND;  Surgeon: Gershon Donnice SAUNDERS, DPM;  Location: [facility];  Service: Podiatry;  Laterality: Right;    Allergies  Allergen Reactions   Gramineae Pollens Other (See Comments)    Sinus     LT foot 11/13/2023   LT foot 01/15/2024   LT foot 02/12/2024  Objective/Physical Exam General: The patient is alert and oriented x3 in no acute distress.  Dermatology:  Ulcer to the dorsum of the left foot has healed and resolved completely.  Complete reepithelialization is occurred.  There is fresh viable epithelial skin  overlying the previous wound  Right ankle wound has healed  Vascular: US  ARTERIAL ABI 12/12/2020 FINDINGS: Right ABI:  1.08 Left ABI:  1.06   Right Lower Extremity:  Normal arterial waveforms at the ankle. Left Lower Extremity:  Normal arterial waveforms at the ankle.   1.0-1.4 Normal   IMPRESSION: Normal bilateral resting ankle-brachial indices.  Neurological: Diminished via light  touch  Musculoskeletal Exam: History of prior Lisfranc amputation right foot  Assessment: 1.  Ulcer dorsum of the left foot secondary to diabetes mellitus 2. H/o right foot revisional Lisfranc amputation. DOS: 01/08/2021   Plan of Care:  -Patient was evaluated.   -Okay to resume full activity no restrictions -Maintain good foot hygiene -Return to clinic 3 months routine footcare  Thresa EMERSON Sar, DPM [facility]  Dr. Thresa EMERSON Sar, DPM    [address] [place], [place] [address]                Office [phone]  Fax [phone]

## 2024-02-14 ENCOUNTER — Ambulatory Visit: Admitting: Podiatry

## 2024-02-14 DIAGNOSIS — Z91198 Patient's noncompliance with other medical treatment and regimen for other reason: Secondary | ICD-10-CM

## 2024-02-14 NOTE — Progress Notes (Signed)
 1. Failure to attend appointment with reason given   Appointment rescheduled.

## 2024-02-15 ENCOUNTER — Encounter: Payer: Self-pay | Admitting: Urology

## 2024-02-15 ENCOUNTER — Other Ambulatory Visit: Payer: Self-pay

## 2024-02-15 ENCOUNTER — Ambulatory Visit: Admitting: Urology

## 2024-02-15 VITALS — BP 168/90 | HR 94 | Ht 67.0 in | Wt 210.2 lb

## 2024-02-15 DIAGNOSIS — N400 Enlarged prostate without lower urinary tract symptoms: Secondary | ICD-10-CM | POA: Insufficient documentation

## 2024-02-15 DIAGNOSIS — Z125 Encounter for screening for malignant neoplasm of prostate: Secondary | ICD-10-CM

## 2024-02-15 DIAGNOSIS — N2889 Other specified disorders of kidney and ureter: Secondary | ICD-10-CM | POA: Diagnosis not present

## 2024-02-15 NOTE — Assessment & Plan Note (Signed)
 Chronic BPH LUTS  -Uncontrolled diabetes  - On Uroxatrol  -Recent Klebsiella UTI   Briefly reviewed his history although not primary reason for visit today.  I will have him return in about 4 weeks for symptom check, PVR, follow-up with PA Vaillancourt.

## 2024-02-15 NOTE — Assessment & Plan Note (Signed)
 PSA 0.5 (2023) -although I do not see these records +Fhx metastatic prostate cancer in father  - Submit PSA today for routine surveillance

## 2024-02-16 LAB — PSA: Prostate Specific Ag, Serum: 2.1 ng/mL (ref 0.0–4.0)

## 2024-03-12 ENCOUNTER — Emergency Department

## 2024-03-12 ENCOUNTER — Observation Stay

## 2024-03-12 ENCOUNTER — Other Ambulatory Visit: Payer: Self-pay

## 2024-03-12 ENCOUNTER — Observation Stay
Admission: EM | Admit: 2024-03-12 | Discharge: 2024-03-13 | Disposition: A | Discharge status: Home / Self Care | Attending: Hospitalist | Admitting: Hospitalist

## 2024-03-12 DIAGNOSIS — G4730 Sleep apnea, unspecified: Secondary | ICD-10-CM | POA: Diagnosis present

## 2024-03-12 DIAGNOSIS — N400 Enlarged prostate without lower urinary tract symptoms: Secondary | ICD-10-CM | POA: Insufficient documentation

## 2024-03-12 DIAGNOSIS — E1142 Type 2 diabetes mellitus with diabetic polyneuropathy: Secondary | ICD-10-CM | POA: Diagnosis present

## 2024-03-12 DIAGNOSIS — E785 Hyperlipidemia, unspecified: Secondary | ICD-10-CM | POA: Insufficient documentation

## 2024-03-12 DIAGNOSIS — Z79899 Other long term (current) drug therapy: Secondary | ICD-10-CM | POA: Insufficient documentation

## 2024-03-12 DIAGNOSIS — D869 Sarcoidosis, unspecified: Secondary | ICD-10-CM | POA: Insufficient documentation

## 2024-03-12 DIAGNOSIS — G9340 Encephalopathy, unspecified: Secondary | ICD-10-CM | POA: Diagnosis not present

## 2024-03-12 DIAGNOSIS — Z89431 Acquired absence of right foot: Secondary | ICD-10-CM

## 2024-03-12 DIAGNOSIS — R112 Nausea with vomiting, unspecified: Secondary | ICD-10-CM

## 2024-03-12 DIAGNOSIS — I10 Essential (primary) hypertension: Secondary | ICD-10-CM | POA: Insufficient documentation

## 2024-03-12 DIAGNOSIS — R519 Headache, unspecified: Principal | ICD-10-CM

## 2024-03-12 DIAGNOSIS — G4733 Obstructive sleep apnea (adult) (pediatric): Secondary | ICD-10-CM | POA: Insufficient documentation

## 2024-03-12 DIAGNOSIS — E1141 Type 2 diabetes mellitus with diabetic mononeuropathy: Secondary | ICD-10-CM | POA: Insufficient documentation

## 2024-03-12 LAB — BLOOD GAS, VENOUS
Acid-base deficit: 0.5 mmol/L (ref 0.0–2.0)
Bicarbonate: 24.2 mmol/L (ref 20.0–28.0)
O2 Saturation: 92.3 %
Patient temperature: 37
pCO2, Ven: 39 mmHg — ABNORMAL LOW (ref 44–60)
pH, Ven: 7.4 (ref 7.25–7.43)
pO2, Ven: 61 mmHg — ABNORMAL HIGH (ref 32–45)

## 2024-03-12 LAB — PROTEIN AND GLUCOSE, CSF
Glucose, CSF: 130 mg/dL — ABNORMAL HIGH (ref 40–70)
Total  Protein, CSF: 51 mg/dL — ABNORMAL HIGH (ref 15–45)

## 2024-03-12 LAB — URINALYSIS, ROUTINE W REFLEX MICROSCOPIC
Bacteria, UA: NONE SEEN
Bilirubin Urine: NEGATIVE
Glucose, UA: >=500 mg/dL — AB
Ketones, ur: 20 mg/dL — AB
Leukocytes,Ua: NEGATIVE
Nitrite: NEGATIVE
Protein, ur: >=300 mg/dL — AB
Specific Gravity, Urine: 1.022 (ref 1.005–1.030)
pH: 6 (ref 5.0–8.0)

## 2024-03-12 LAB — TROPONIN T, HIGH SENSITIVITY
Troponin T High Sensitivity: <15 ng/L (ref 0–19)
Troponin T High Sensitivity: <15 ng/L (ref 0–19)

## 2024-03-12 LAB — AMMONIA: Ammonia: 22 umol/L (ref 9–35)

## 2024-03-12 LAB — COMPREHENSIVE METABOLIC PANEL WITH GFR
ALT: 15 U/L (ref 0–44)
AST: 18 U/L (ref 15–41)
Albumin: 4.3 g/dL (ref 3.5–5.0)
Alkaline Phosphatase: 79 U/L (ref 38–126)
Anion gap: 14 (ref 5–15)
BUN: 23 mg/dL — ABNORMAL HIGH (ref 6–20)
CO2: 25 mmol/L (ref 22–32)
Calcium: 8.8 mg/dL — ABNORMAL LOW (ref 8.9–10.3)
Chloride: 96 mmol/L — ABNORMAL LOW (ref 98–111)
Creatinine, Ser: 0.88 mg/dL (ref 0.61–1.24)
GFR, Estimated: >60 mL/min
Glucose, Bld: 274 mg/dL — ABNORMAL HIGH (ref 70–99)
Potassium: 4.8 mmol/L (ref 3.5–5.1)
Sodium: 134 mmol/L — ABNORMAL LOW (ref 135–145)
Total Bilirubin: 0.6 mg/dL (ref 0.0–1.2)
Total Protein: 7.3 g/dL (ref 6.5–8.1)

## 2024-03-12 LAB — CBC WITH DIFFERENTIAL/PLATELET
Abs Immature Granulocytes: 0.04 K/uL (ref 0.00–0.07)
Basophils Absolute: 0 K/uL (ref 0.0–0.1)
Basophils Relative: 0 %
Eosinophils Absolute: 0 K/uL (ref 0.0–0.5)
Eosinophils Relative: 0 %
HCT: 40.1 % (ref 39.0–52.0)
Hemoglobin: 13.3 g/dL (ref 13.0–17.0)
Immature Granulocytes: 0 %
Lymphocytes Relative: 13 %
Lymphs Abs: 1.2 K/uL (ref 0.7–4.0)
MCH: 30.4 pg (ref 26.0–34.0)
MCHC: 33.2 g/dL (ref 30.0–36.0)
MCV: 91.8 fL (ref 80.0–100.0)
Monocytes Absolute: 0.5 K/uL (ref 0.1–1.0)
Monocytes Relative: 6 %
Neutro Abs: 7.4 K/uL (ref 1.7–7.7)
Neutrophils Relative %: 81 %
Platelets: 223 K/uL (ref 150–400)
RBC: 4.37 MIL/uL (ref 4.22–5.81)
RDW: 12.1 % (ref 11.5–15.5)
WBC: 9.2 K/uL (ref 4.0–10.5)
nRBC: 0 % (ref 0.0–0.2)

## 2024-03-12 LAB — CSF CELL COUNT WITH DIFFERENTIAL
Eosinophils, CSF: 0 %
Lymphs, CSF: 26 %
Monocyte-Macrophage-Spinal Fluid: 43 %
RBC Count, CSF: 0 /mm3 (ref 0–3)
Segmented Neutrophils-CSF: 31 %
Tube #: 3
WBC, CSF: 2 /mm3 (ref 0–5)

## 2024-03-12 LAB — C-REACTIVE PROTEIN: CRP: <0.5 mg/dL

## 2024-03-12 LAB — PROTIME-INR
INR: 1 (ref 0.8–1.2)
Prothrombin Time: 13.5 s (ref 11.4–15.2)

## 2024-03-12 LAB — RESP PANEL BY RT-PCR (RSV, FLU A&B, COVID)  RVPGX2
Influenza A by PCR: NEGATIVE
Influenza B by PCR: NEGATIVE
Resp Syncytial Virus by PCR: NEGATIVE
SARS Coronavirus 2 by RT PCR: NEGATIVE

## 2024-03-12 LAB — HEMOGLOBIN A1C
Hgb A1c MFr Bld: 8.8 % — ABNORMAL HIGH (ref 4.8–5.6)
Mean Plasma Glucose: 205.86 mg/dL

## 2024-03-12 LAB — SEDIMENTATION RATE: Sed Rate: 35 mm/h — ABNORMAL HIGH (ref 0–20)

## 2024-03-12 LAB — MAGNESIUM
Magnesium: 1.9 mg/dL (ref 1.7–2.4)
Magnesium: 1.8 mg/dL (ref 1.7–2.4)

## 2024-03-12 LAB — CBG MONITORING, ED
Glucose-Capillary: 214 mg/dL — ABNORMAL HIGH (ref 70–99)
Glucose-Capillary: 233 mg/dL — ABNORMAL HIGH (ref 70–99)

## 2024-03-12 LAB — LIPASE, BLOOD: Lipase: 127 U/L — ABNORMAL HIGH (ref 11–51)

## 2024-03-12 LAB — ETHANOL: Alcohol, Ethyl (B): <15 mg/dL

## 2024-03-12 MED ORDER — INSULIN ASPART 100 UNIT/ML IJ SOLN
0.0000 [IU] | Freq: Three times a day (TID) | INTRAMUSCULAR | Status: DC
  Administered 2024-03-13 (×2): 3 [IU] via SUBCUTANEOUS
  Filled 2024-03-12 (×2): qty 3

## 2024-03-12 MED ORDER — SODIUM CHLORIDE 0.9% FLUSH
3.0000 mL | Freq: Two times a day (BID) | INTRAVENOUS | Status: DC
  Administered 2024-03-12: 3 mL via INTRAVENOUS

## 2024-03-12 MED ORDER — LIDOCAINE 1 % OPTIME INJ - NO CHARGE
5.0000 mL | Freq: Once | INTRAMUSCULAR | Status: AC
  Administered 2024-03-12: 5 mL

## 2024-03-12 MED ORDER — MORPHINE SULFATE (PF) 4 MG/ML IV SOLN
4.0000 mg | Freq: Once | INTRAVENOUS | Status: AC
  Administered 2024-03-12: 4 mg via INTRAVENOUS
  Filled 2024-03-12: qty 1

## 2024-03-12 MED ORDER — KETOROLAC TROMETHAMINE 30 MG/ML IJ SOLN
30.0000 mg | Freq: Once | INTRAMUSCULAR | Status: AC
  Administered 2024-03-12: 30 mg via INTRAVENOUS
  Filled 2024-03-12: qty 1

## 2024-03-12 MED ORDER — LABETALOL HCL 5 MG/ML IV SOLN
10.0000 mg | Freq: Once | INTRAVENOUS | Status: AC
  Administered 2024-03-12: 10 mg via INTRAVENOUS
  Filled 2024-03-12: qty 4

## 2024-03-12 MED ORDER — DIPHENHYDRAMINE HCL 50 MG/ML IJ SOLN
25.0000 mg | INTRAMUSCULAR | Status: AC
  Administered 2024-03-12: 25 mg via INTRAVENOUS
  Filled 2024-03-12: qty 1

## 2024-03-12 MED ORDER — LORAZEPAM 2 MG/ML IJ SOLN
1.0000 mg | Freq: Once | INTRAMUSCULAR | Status: AC
  Administered 2024-03-13: 1 mg via INTRAVENOUS
  Filled 2024-03-12: qty 1

## 2024-03-12 MED ORDER — ONDANSETRON HCL 4 MG/2ML IJ SOLN: 4.0000 mg | Freq: Four times a day (QID) | INTRAMUSCULAR | Status: DC | PRN

## 2024-03-12 MED ORDER — VALPROATE SODIUM 100 MG/ML IV SOLN
1000.0000 mg | Freq: Once | INTRAVENOUS | Status: AC
  Administered 2024-03-12: 1000 mg via INTRAVENOUS
  Filled 2024-03-12: qty 10

## 2024-03-12 MED ORDER — DROPERIDOL 2.5 MG/ML IJ SOLN
1.2500 mg | Freq: Once | INTRAMUSCULAR | Status: AC
  Administered 2024-03-12: 1.25 mg via INTRAVENOUS
  Filled 2024-03-12: qty 2

## 2024-03-12 MED ORDER — ONDANSETRON HCL 4 MG PO TABS: 4.0000 mg | ORAL_TABLET | Freq: Four times a day (QID) | ORAL | Status: DC | PRN

## 2024-03-12 MED ORDER — IOHEXOL 350 MG/ML SOLN
75.0000 mL | Freq: Once | INTRAVENOUS | Status: AC | PRN
  Administered 2024-03-12: 75 mL via INTRAVENOUS

## 2024-03-12 MED ORDER — DIPHENHYDRAMINE HCL 50 MG/ML IJ SOLN
25.0000 mg | Freq: Once | INTRAMUSCULAR | Status: AC
  Administered 2024-03-12: 25 mg via INTRAVENOUS
  Filled 2024-03-12: qty 1

## 2024-03-12 MED ORDER — SENNOSIDES-DOCUSATE SODIUM 8.6-50 MG PO TABS: 1.0000 | ORAL_TABLET | Freq: Every evening | ORAL | Status: DC | PRN

## 2024-03-12 MED ORDER — SUMATRIPTAN SUCCINATE 6 MG/0.5ML ~~LOC~~ SOLN
6.0000 mg | Freq: Once | SUBCUTANEOUS | Status: AC
  Administered 2024-03-12: 6 mg via SUBCUTANEOUS
  Filled 2024-03-12: qty 0.5

## 2024-03-12 MED ORDER — SODIUM CHLORIDE 0.9 % IV BOLUS
500.0000 mL | Freq: Once | INTRAVENOUS | Status: AC
  Administered 2024-03-12: 500 mL via INTRAVENOUS

## 2024-03-12 MED ORDER — HEPARIN SODIUM (PORCINE) 5000 UNIT/ML IJ SOLN
5000.0000 [IU] | Freq: Three times a day (TID) | INTRAMUSCULAR | Status: DC
  Administered 2024-03-12 – 2024-03-13 (×2): 5000 [IU] via SUBCUTANEOUS
  Filled 2024-03-12 (×2): qty 1

## 2024-03-12 MED ORDER — DEXAMETHASONE SOD PHOSPHATE PF 10 MG/ML IJ SOLN
8.0000 mg | Freq: Once | INTRAMUSCULAR | Status: AC
  Administered 2024-03-12: 8 mg via INTRAVENOUS

## 2024-03-12 MED ORDER — INSULIN ASPART 100 UNIT/ML IJ SOLN
0.0000 [IU] | Freq: Every day | INTRAMUSCULAR | Status: DC
  Administered 2024-03-12: 2 [IU] via SUBCUTANEOUS
  Filled 2024-03-12: qty 5

## 2024-03-12 MED ORDER — LACTATED RINGERS IV SOLN: INTRAVENOUS | Status: DC

## 2024-03-12 MED ORDER — METOCLOPRAMIDE HCL 5 MG/ML IJ SOLN
10.0000 mg | Freq: Once | INTRAMUSCULAR | Status: AC
  Administered 2024-03-12: 10 mg via INTRAVENOUS
  Filled 2024-03-12: qty 2

## 2024-03-12 MED ORDER — ACETAMINOPHEN 650 MG RE SUPP: 650.0000 mg | Freq: Four times a day (QID) | RECTAL | Status: DC | PRN

## 2024-03-12 MED ORDER — SODIUM CHLORIDE 0.9 % IV BOLUS
1000.0000 mL | Freq: Once | INTRAVENOUS | Status: AC
  Administered 2024-03-12: 1000 mL via INTRAVENOUS

## 2024-03-12 MED ORDER — ACETAMINOPHEN 325 MG PO TABS: 650.0000 mg | ORAL_TABLET | Freq: Four times a day (QID) | ORAL | Status: DC | PRN

## 2024-03-12 NOTE — ED Notes (Signed)
 Pt taken to MRI

## 2024-03-12 NOTE — ED Notes (Signed)
 Pt requesting pain meds. MD messaged at this time.

## 2024-03-12 NOTE — H&P (Addendum)
 " History and Physical    Fernando Vega FMW:[id number] DOB: [birth year] DOA: 03/12/2024  DOS: the patient was seen and examined on 03/12/2024  PCP: [facility]   Patient coming from: Home  I have personally briefly reviewed patient's old medical records in [facility] and [facility]where  HPI:   Fernando Vega is a 53 y.o. year old male with medical history of hypertension, hyperlipidemia, type 2 diabetes, history of diabetic foot infection status post transmetatarsal amputation, cluster headaches presenting to the ED with severe headache that started overnight.  Headache was described as severe headache of his life.  Given the headache persisted EMS was called.  Headache was localized to his right eye.  Patient was noted to be altered. On arrival to the ED patient was noted to be HDS stable.  CT head without contrast obtained that did not show any acute findings.  MRI was limited due to being terminated early but did not show any acute findings.  CTA of head did not show any acute findings.  Lumbar puncture obtained that is pending.  Neurology consulted by EDP and will see the patient.  Given patient presentation, [facility] contacted for admission.  Review of Systems: unable to review all systems due to the inability of the patient to answer questions.   Past Medical History:  Diagnosis Date   Diabetes mellitus without complication (HCC)    Hypertension    Neuropathy    Sarcoidosis    Sleep apnea     Past Surgical History:  Procedure Laterality Date   AMPUTATION Right 12/13/2020   Procedure: AMPUTATION midFOOT-RIGHT;  Surgeon: Silva Juliene JONELLE, DPM;  Location: [facility] ORS;  Service: Podiatry;  Laterality: Right;   AMPUTATION Right 11/05/2020   Procedure: AMPUTATION RAY-Partial Ray Amp 2nd & 3rd Digit;  Surgeon: Janit Thresa HERO, DPM;  Location: [facility];  Service: Podiatry;  Laterality: Right;   APPLICATION OF WOUND VAC Right 11/09/2020   Procedure: APPLICATION OF WOUND  VAC;  Surgeon: Janit Thresa HERO, DPM;  Location: [facility];  Service: Podiatry;  Laterality: Right;   BONE BIOPSY Right 01/08/2021   Procedure: BONE BIOPSY;  Surgeon: Gershon Donnice JONELLE, DPM;  Location: [facility];  Service: Podiatry;  Laterality: Right;   COLONOSCOPY WITH PROPOFOL  N/A 07/19/2021   Procedure: COLONOSCOPY WITH PROPOFOL ;  Surgeon: Therisa Bi, MD;  Location: [facility] ENDOSCOPY;  Service: Gastroenterology;  Laterality: N/A;   COLONOSCOPY WITH PROPOFOL  N/A 11/23/2021   Procedure: COLONOSCOPY WITH PROPOFOL ;  Surgeon: Therisa Bi, MD;  Location: [facility] ENDOSCOPY;  Service: Gastroenterology;  Laterality: N/A;   COLONOSCOPY WITH PROPOFOL  N/A 02/07/2023   Procedure: COLONOSCOPY WITH PROPOFOL ;  Surgeon: Therisa Bi, MD;  Location: [facility] ENDOSCOPY;  Service: Gastroenterology;  Laterality: N/A;   INCISION AND DRAINAGE Right 11/01/2020   Procedure: INCISION AND DRAINAGE-Right Foot;  Surgeon: Silva Juliene JONELLE, DPM;  Location: [facility];  Service: Podiatry;  Laterality: Right;   IRRIGATION AND DEBRIDEMENT FOOT Right 11/09/2020   Procedure: IRRIGATION AND DEBRIDEMENT FOOT;  Surgeon: Janit Thresa HERO, DPM;  Location: [facility];  Service: Podiatry;  Laterality: Right;   IRRIGATION AND DEBRIDEMENT FOOT Right 12/13/2020   Procedure: IRRIGATION AND DEBRIDEMENT FOOT;  Surgeon: Silva Juliene JONELLE, DPM;  Location: [facility];  Service: Podiatry;  Laterality: Right;   TRANSMETATARSAL AMPUTATION Right 01/08/2021   Procedure: LISFRANC  AMPUTATION;  Surgeon: Gershon Donnice JONELLE, DPM;  Location: [facility];  Service: Podiatry;  Laterality: Right;   WOUND DEBRIDEMENT Right 12/16/2020   Procedure: DEBRIDEMENT WOUND;  Surgeon: Tobie Franky SQUIBB, DPM;  Location: [facility] OR[facility];  Service: Podiatry;  Laterality: Right;  Transmet amputation washout & closure   WOUND DEBRIDEMENT Right 01/08/2021   Procedure: DEBRIDEMENT WOUND;  Surgeon: Gershon Donnice SAUNDERS, DPM;  Location: [facility] OR[facility];  Service: Podiatry;  Laterality: Right;     Allergies[1]  Family  History  Problem Relation Age of Onset   Hypertension Mother    Diabetes type II Father    Parkinson's disease Father    Heart disease Father     Prior to Admission medications  Medication Sig Start Date End Date Taking? Authorizing Provider  acetaminophen  (TYLENOL ) 500 MG tablet Take 1 tablet (500 mg total) by mouth every 6 (six) hours as needed for mild pain (or Fever >/= 101). 12/19/20  Yes Danton Reyes DASEN, MD  albuterol  (VENTOLIN  HFA) 108 (90 Base) MCG/ACT inhaler VENTOLIN  HFA 108 (90 Base) MCG/ACT AERS 09/26/23  Yes [provider]  alfuzosin  (UROXATRAL ) 10 MG 24 hr tablet Take 1 tablet (10 mg total) by mouth daily with breakfast. 01/20/22  Yes Vaillancourt, Samantha, PA-C  amLODipine  (NORVASC ) 5 MG tablet Take 5 mg by mouth daily. 08/08/22  Yes [provider]  Ascorbic Acid  (VITAMIN C WITH ROSE HIPS) 500 MG tablet Take 500 mg by mouth daily.   Yes [provider]  atorvastatin  (LIPITOR) 20 MG tablet Take 20 mg by mouth daily. 05/27/21  Yes [provider]  BACTRIM  DS 800-160 MG tablet BACTRIM  DS 800-160 MG TABS 11/07/23  Yes [provider]  fluticasone  (FLONASE ) 50 MCG/ACT nasal spray Place 1 spray into both nostrils 2 (two) times daily.   Yes [provider]  JARDIANCE 10 MG TABS tablet Take 10 mg by mouth daily. 08/15/22  Yes [provider]  losartan  (COZAAR ) 25 MG tablet Take 25 mg by mouth daily. 12/05/21  Yes [provider]  MOUNJARO 2.5 MG/0.5ML Pen MOUNJARO 2.5 MG/0.5ML SOAJ 11/07/23  Yes [provider]  Multiple Vitamin (MULTIVITAMIN WITH MINERALS) TABS tablet Take 1 tablet by mouth once daily. 11/13/20  Yes Wieting, Richard, MD  Olopatadine HCl 0.2 % SOLN Apply 1 drop to eye daily. 07/10/22  Yes [provider]  pantoprazole  (PROTONIX ) 40 MG tablet Take 1 tablet (40 mg total) by mouth once daily. 12/30/20  Yes Iloabachie, Chioma E, NP  sildenafil  (REVATIO ) 20 MG tablet Take 3-5 tablets about  one hour prior to intercourse. Do not exceed 5 tablets per dose. 03/31/22  Yes Vaillancourt, Samantha, PA-C  ZYRTEC  ALLERGY 10 MG tablet ZYRTEC  ALLERGY 10 MG TABS 09/03/23  Yes [provider]  amoxicillin -clavulanate (AUGMENTIN ) 875-125 MG tablet Take 1 tablet by mouth 2 (two) times daily. Patient not taking: Reported on 03/12/2024 12/05/23   Magdalen Prentice PARAS, DPM  Continuous Glucose Receiver (DEXCOM G7 RECEIVER) DEVI SMARTSIG:1 Unit(s) As Directed    [provider]  Continuous Glucose Sensor (DEXCOM G7 SENSOR) MISC SMARTSIG:1 device Topical Once a Month    [provider]  gentamicin  ointment (GARAMYCIN ) 0.1 % Apply 1 Application topically daily. Apply to wound daily Patient not taking: Reported on 03/12/2024 08/29/23   Silva Juliene SAUNDERS, DPM  Glucagon (GVOKE HYPOPEN 2-PACK) 1 MG/0.2ML SOAJ GVOKE HYPOPEN 2-PACK 1 MG/0.2ML SOAJ 08/08/22   [provider]  insulin  glargine (LANTUS  SOLOSTAR) 100 UNIT/ML Solostar Pen LANTUS  SOLOSTAR 100 UNIT/ML SOPN 06/13/23   [provider]  insulin  lispro (HUMALOG  KWIKPEN) 100 UNIT/ML KwikPen Inject 7 Units into the skin 3 (three) times daily before meals. Patient taking differently:  Inject 12 Units into the skin 3 (three) times daily before meals. 01/27/21   Iloabachie, Chioma E, NP  ketoconazole  (NIZORAL ) 2 % cream Apply to both feet and between toes once daily for 6 weeks. Patient not taking: Reported on 03/12/2024 11/08/23   Gaynel Delon CROME, DPM  [employer] PEN NEEDLES 32G X 4 MM MISC KROGER PEN NEEDLES 32G X 4 MM 11/07/23   [provider]  Lancets Ultra Thin MISC LANCETS ULTRA THIN 06/09/21   [provider]  NOVOLOG  FLEXPEN 100 UNIT/ML FlexPen SMARTSIG:12 Unit(s) SUB-Q 3 Times Daily    [provider]  Potassium Chloride ER 20 MEQ TBCR POTASSIUM CHLORIDE ER 20 MEQ CR-TABS Patient not taking: Reported on 03/12/2024 09/06/22   [provider]  SEMGLEE , YFGN, 100 UNIT/ML Pen Inject into the  skin daily. 06/13/23   [provider]    Social History:  reports that he has never smoked. He has never been exposed to tobacco smoke. He has never used smokeless tobacco. He reports that he does not drink alcohol and does not use drugs.    Physical Exam: Vitals:   03/12/24 1500 03/12/24 1530 03/12/24 1600 03/12/24 1735  BP: (!) 176/99 (!) 164/99 (!) 160/89 (!) 157/87  Pulse: (!) 111 (!) 112 (!) 113 (!) 116  Resp:    12  Temp:    99.4 F (37.4 C)  TempSrc:    Oral  SpO2:    94%  Weight:      Height:        Gen: NAD HENT: NCAT CV: normal heart sounds Lung: CTAB Abd: No TTP, normal bowel sounds MSK: No asymmetry, good bulk and tone Neuro: somnolent but oriented x4, moving all extremities.    Labs on Admission: I have personally reviewed following labs and imaging studies  CBC: Recent Labs  Lab 03/12/24 0556  WBC 9.2  NEUTROABS 7.4  HGB 13.3  HCT 40.1  MCV 91.8  PLT 223   Basic Metabolic Panel: Recent Labs  Lab 03/12/24 0556  NA 134*  K 4.8  CL 96*  CO2 25  GLUCOSE 274*  BUN 23*  CREATININE 0.88  CALCIUM  8.8*  MG 1.9   GFR: Estimated Creatinine Clearance: 105.6 mL/min (by C-G formula based on SCr of 0.88 mg/dL). Liver Function Tests: Recent Labs  Lab 03/12/24 0556  AST 18  ALT 15  ALKPHOS 79  BILITOT 0.6  PROT 7.3  ALBUMIN 4.3   Recent Labs  Lab 03/12/24 0556  LIPASE 127*   Recent Labs  Lab 03/12/24 1720  AMMONIA 22   Coagulation Profile: Recent Labs  Lab 03/12/24 0556  INR 1.0   Cardiac Enzymes: No results for input(s): CKTOTAL, CKMB, CKMBINDEX, TROPONINI, TROPONINIHS in the last 168 hours. BNP (last 3 results) No results for input(s): BNP in the last 8760 hours. HbA1C: No results for input(s): HGBA1C in the last 72 hours. CBG: Recent Labs  Lab 03/12/24 1532  GLUCAP 214*   Lipid Profile: No results for input(s): CHOL, HDL, LDLCALC, TRIG, CHOLHDL, LDLDIRECT in the last 72  hours. Thyroid  Function Tests: No results for input(s): TSH, T4TOTAL, FREET4, T3FREE, THYROIDAB in the last 72 hours. Anemia Panel: No results for input(s): VITAMINB12, FOLATE, FERRITIN, TIBC, IRON, RETICCTPCT in the last 72 hours. Urine analysis:    Component Value Date/Time   COLORURINE YELLOW (A) 03/12/2024 1720   APPEARANCEUR CLEAR (A) 03/12/2024 1720   APPEARANCEUR Clear 12/19/2021 0815   LABSPEC 1.022 03/12/2024 1720   PHURINE 6.0 03/12/2024 1720  GLUCOSEU >=500 (A) 03/12/2024 1720   HGBUR SMALL (A) 03/12/2024 1720   BILIRUBINUR NEGATIVE 03/12/2024 1720   BILIRUBINUR Negative 12/19/2021 0815   KETONESUR 20 (A) 03/12/2024 1720   PROTEINUR >=300 (A) 03/12/2024 1720   NITRITE NEGATIVE 03/12/2024 1720   LEUKOCYTESUR NEGATIVE 03/12/2024 1720    Radiological Exams on Admission: I have personally reviewed images CT VENOGRAM HEAD Result Date: 03/12/2024 EXAM: CT VENOGRAM WITH CONTRAST 03/12/2024 06:38:38 PM TECHNIQUE: CT venogram of the head/brain was performed with the administration of intravenous contrast. Multiplanar reformatted images are provided for review. MIP images are provided for review. Automated exposure control, iterative reconstruction, and/or weight based adjustment of the mA/kV was utilized to reduce the radiation dose to as low as reasonably achievable. COMPARISON: None available. CLINICAL HISTORY: HA, NV, neuro recommended HA, NV, neuro recommended FINDINGS: BRAIN/VENTRICLES: No acute intracranial hemorrhage. No extra axial fluid collection. Gray-white differentiation is maintained. No mass effect or midline shift. No hydrocephalus. ORBITS: No acute abnormality. SINUSES AND MASTOIDS: No acute abnormality. SOFT TISSUES AND SKULL: No acute abnormality. CT VENOGRAM: No dural venous sinus thrombosis. No significant stenosis. IMPRESSION: 1. No evidence of dural venous sinus thrombosis or stenosis. Electronically signed by: Donnice Mania MD 03/12/2024 07:08  PM EST RP Workstation: [id number]   CT Angio Head W or Wo Contrast Result Date: 03/12/2024 EXAM: CT HEAD WITHOUT CTA HEAD WITH AND WITHOUT 03/12/2024 06:38:38 PM TECHNIQUE: CT of the head was performed without the administration of intravenous contrast followed by a CTA of the head was performed with and without the administration of 75 mL of iohexol  (OMNIPAQUE ) 350 MG/ML injection. Multiplanar 2D and/or 3D reformatted images are provided for review. Automated exposure control, iterative reconstruction, and/or weight based adjustment of the mA/kV was utilized to reduce the radiation dose to as low as reasonably achievable. COMPARISON: MRI head and CT head 03/12/2024 CLINICAL HISTORY: HA, NV, AMS FINDINGS: CT HEAD: BRAIN AND VENTRICLES: There is no acute intracranial hemorrhage, mass effect or midline shift. No abnormal extra-axial fluid collection. No evidence of acute infarct. There is no hydrocephalus. There is no abnormal enhancement. ORBITS: The visualized portion of the orbits demonstrate no acute abnormality. SINUSES: Mucosal thickening within the left sphenoid sinus. The visualized mastoid air cells demonstrate no acute abnormality. SOFT TISSUES AND SKULL: No acute abnormality of the visualized skull or soft tissues. CTA HEAD: ANTERIOR CIRCULATION: Atherosclerosis of the bilateral carotid siphons with mild stenosis of the bilateral cavernous ICAs. There is moderate stenosis of the right supraclinoid ICA and mild to moderate stenosis of the left supraclinoid ICA. No significant stenosis of the anterior cerebral arteries. No significant stenosis of the middle cerebral arteries. No aneurysm. POSTERIOR CIRCULATION: No significant stenosis of the posterior cerebral arteries. No significant stenosis of the basilar artery. No significant stenosis of the vertebral arteries. No aneurysm. OTHER: No dural venous sinus thrombosis on this non-dedicated study. IMPRESSION: 1. No large vessel occlusion of the  intracranial arteries. 2. Atherosclerosis of the bilateral carotid siphons with mild stenosis of the bilateral cavernous ICAs, moderate stenosis of the right supraclinoid ICA, and mild to moderate stenosis of the left supraclinoid ICA. Electronically signed by: Donnice Mania MD 03/12/2024 06:55 PM EST RP Workstation: [id number]   MR BRAIN WO CONTRAST Result Date: 03/12/2024 EXAM: MRI BRAIN WITHOUT CONTRAST 03/12/2024 11:17:45 AM TECHNIQUE: Multiplanar multisequence MRI of the head/brain was performed without the administration of intravenous contrast. The patient terminated the examination prior to completion. Axial DWI, Sagittal T1, and Axial T2 sequences were obtained. COMPARISON: Head  CT 03/12/2024. CLINICAL HISTORY: Headache, increasing frequency or severity; Headache, neuro deficit; Headache, new onset (Age >= 51y). Nausea, vomiting, and photophobia. FINDINGS: BRAIN AND VENTRICLES: There is no acute infarct. No midline shift or other intracranial mass effect is evident. There is no hydrocephalus or extra-axial fluid collection. Cerebral volume is within normal limits for age. There may be small chronic bilateral cerebellar infarcts. Assessment for intracranial hemorrhage is limited on this truncated examination. Major intracranial vascular flow voids are preserved. ORBITS: No acute abnormality. SINUSES AND MASTOIDS: Mild mucosal thickening in the paranasal sinuses. Trace right mastoid fluid. BONES AND SOFT TISSUES: Normal marrow signal. No acute soft tissue abnormality. IMPRESSION: 1. No acute intracranial abnormality identified on this incomplete examination. Electronically signed by: Dasie Hamburg MD 03/12/2024 11:31 AM EST RP Workstation: [id number]   CT Head Wo Contrast Result Date: 03/12/2024 EXAM: CT HEAD WITHOUT CONTRAST 03/12/2024 06:04:27 AM TECHNIQUE: CT of the head was performed without the administration of intravenous contrast. Automated exposure control, iterative reconstruction, and/or  weight based adjustment of the mA/kV was utilized to reduce the radiation dose to as low as reasonably achievable. COMPARISON: 09/09/2023 CLINICAL HISTORY: Headache, new onset (Age >= 51y); Headache, sudden, severe FINDINGS: BRAIN AND VENTRICLES: No acute hemorrhage. No evidence of acute infarct. No hydrocephalus. No extra-axial collection. No mass effect or midline shift. There is mild periventricular white matter disease. There are calcifications within the carotid siphons. ORBITS: No acute abnormality. SINUSES: There is circumferential mucosal disease within the floor of the left sphenoid sinus. SOFT TISSUES AND SKULL: No acute soft tissue abnormality. No skull fracture. IMPRESSION: 1. No acute intracranial abnormality. 2. Mild periventricular white matter disease, most compatible with chronic microvascular ischemic change. 3. Carotid siphon calcifications, compatible with atherosclerosis. 4. Circumferential mucosal disease within the floor of the left sphenoid sinus. 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  On my evaluation later in the evening patient is slightly somnolent but oriented x 4.  Suspect his encephalopathy may be secondary to severe pain.  Imaging has been negative for any acute findings which is reassuring.  Lumbar puncture was obtained but my suspicion for central nervous system infection is going given the exam.  Will follow-up the results.  Hold any  antibiotics.  Neurology consulted will await their recommendations.  Patient is tachycardic which may be secondary to dehydration and can further lead to changes in mental status.  Pt changes in mental status were prior to administration of valproate. My current leading diagnosis for severe headache is a cluster headache given the characteristics and his history and response to HF oxygen.  Type 2 diabetes mellitus: Holding home regimen and placing patient on SSI given poor oral intake given acute setting. A1c is pending.  Hypertension: Holding home meds but will continue to monitor blood pressure.  Can use IV antihypertensives as needed.  BPH: Will resume home med once med reconciliation is performed and patient is tolerating a diet  Hyperlipidemia: Continue home med once patient is tolerating a diet.  VTE prophylaxis:  SQ Heparin   Diet: N.p.o. Code Status:  Full Code Telemetry:  Admission status: Observation, Progressive Patient is from: Home Anticipated d/c is to: Home Anticipated d/c is in: 1-2 days   Family Communication: Updated at bedside  Consults called: Neurology   Severity of Illness: The appropriate patient status for this patient is OBSERVATION. Observation status is judged to be reasonable and necessary in order to provide the required intensity of service to ensure the patient's safety. The patient's presenting symptoms, physical exam findings, and initial radiographic and laboratory data in the context of their medical condition is felt to place them at decreased risk for further clinical deterioration. Furthermore, it is anticipated that the patient will be medically stable for discharge from the hospital within 2 midnights of admission.    Morene Bathe, MD [facility] DEL. [facility]     [1]  Allergies Allergen Reactions   Tirzepatide    Gramineae Pollens Other (See Comments)    Sinus   "

## 2024-03-12 NOTE — ED Provider Notes (Signed)
 Dr. Matthews has recommended that she will be seeing a provider in consult, in the interim she does request we obtain lumbar puncture and she will also order EEG for further workup.  I discussed with the patient's wife, the patient at this time cannot consent.  I did just reevaluate him again he wakes up briefly, maintains brief eye contact but he is not able to have conversation.  He is able to tell me that he just feels like crap but otherwise does not maintain conversation, does not appear well oriented.  He seems somewhat delirious potentially.  Discussed with interventional radiologist Dr. Jenna, his team will be reaching out to the patient's wife, consenting, and we will plan to proceed with interventional guided lumbar puncture at this time as we also await further neurology evaluation EEG etc.  Given his previous workup, and the severity of his headache with alteration in mental status I think it is prudent to exclude causes such as central infection, he is also diabetic, his diabetes appears poorly controlled.  He would be at elevated risk for meningitis/encephalitis type picture.   Fernando Anes, MD 03/12/24 [phone]

## 2024-03-12 NOTE — ED Provider Notes (Signed)
 "  [facility] Provider Note    Event Date/Time   First MD Initiated Contact with Patient 03/12/24 [phone]     (approximate)   History   Headache   HPI Fernando Vega is a 53 y.o. male whose medical history is notable for type 2 diabetes, renal mass, sarcoidosis, and prior foot issues including a transmetatarsal amputation of the right foot.  He presents by EMS for evaluation of acute onset and severe worst headache of his life.  He was normal when he went to bed but he does not know what time he went to bed.  When he woke up he had a severe headache, and this was at about 2:30 AM.  The headache has been persistent for the last 3 hours so he called EMS.  There is accompanied with photophobia and nausea and persistent vomiting.  He vomited 2 or 3 times just while I was in the room when we were transferring him from EMS stretcher to ED stretcher.  He describes severe generalized sharp pain in his head with more pressure behind his right eye.  He denies visual changes but said that the bright lights hurt his eyes.  He seems a bit confused but unclear if this is his baseline.  The paramedics report that the first responders said that initially his left arm seemed weak, that has not been the case since then and he is showing normal use of his extremities including grabbing the emesis bag with both of his hands.  He denies chest pain and shortness of breath and abdominal pain other than pain associated with vomiting.  He is denying any weakness in his extremities.     Physical Exam   ED Triage Vitals  Encounter Vitals Group     BP 03/12/24 0555 (!) 191/96     Girls Systolic BP Percentile --      Girls Diastolic BP Percentile --      Boys Systolic BP Percentile --      Boys Diastolic BP Percentile --      Pulse Rate 03/12/24 0555 (!) 102     Resp 03/12/24 0555 18     Temp 03/12/24 0555 98.6 F (37 C)     Temp Source 03/12/24 0555 Oral     SpO2 03/12/24 0555 100 %      Weight 03/12/24 0610 97.7 kg (215 lb 6.2 oz)     Height 03/12/24 0610 1.702 m (5' 7)     Head Circumference --      Peak Flow --      Pain Score 03/12/24 0610 10     Pain Loc --      Pain Education --      Exclude from Growth Chart --       Most recent vital signs: Vitals:   03/12/24 0555  BP: (!) 191/96  Pulse: (!) 102  Resp: 18  Temp: 98.6 F (37 C)  SpO2: 100%    General: Awake, alert, ill-appearing and in distress from vomiting CV:  Good peripheral perfusion.  Mild tachycardia, regular rhythm Resp:  Normal effort. no accessory muscle usage nor intercostal retractions.   Abd:  Abdomen appears distended at baseline and he has a soft and easily reducible umbilical hernia that is nontender.  He is not tender to palpation but he said that when I palpate his abdomen that makes his nausea worse. Other:  Pupils are equal and reactive although the patient is exhibiting photophobia and pulling  away from the light.  Extraocular movement is intact.   ED Results / Procedures / Treatments   Labs (all labs ordered are listed, but only abnormal results are displayed) Labs Reviewed  COMPREHENSIVE METABOLIC PANEL WITH GFR - Abnormal; Notable for the following components:      Result Value   Sodium 134 (*)    Chloride 96 (*)    Glucose, Bld 274 (*)    BUN 23 (*)    Calcium  8.8 (*)    All other components within normal limits  LIPASE, BLOOD - Abnormal; Notable for the following components:   Lipase 127 (*)    All other components within normal limits  MAGNESIUM  ETHANOL  CBC WITH DIFFERENTIAL/PLATELET  PROTIME-INR  TROPONIN T, HIGH SENSITIVITY      RADIOLOGY See ED course for details   PROCEDURES:  Critical Care performed: No  Procedures    IMPRESSION / MDM / ASSESSMENT AND PLAN / ED COURSE  I reviewed the triage vital signs and the nursing notes.                              Differential diagnosis includes, but is not limited to, nontraumatic subarachnoid  hemorrhage or other intracranial bleed, neoplasm, undisclosed traumatic injury, CVA, complex migraine, medication or drug side effect.  Patient's presentation is most consistent with acute presentation with potential threat to life or bodily function.  Labs/studies ordered: CBC with differential, CMP, lipase, magnesium, ethanol, high-sensitivity troponin, pro time-INR, CT head  Interventions/Medications given:  Medications  droperidol  (INAPSINE ) 2.5 MG/ML injection 1.25 mg (1.25 mg Intravenous Given 03/12/24 0613)  diphenhydrAMINE  (BENADRYL ) injection 25 mg (25 mg Intravenous Given 03/12/24 0616)    (Note:  hospital course my include additional interventions and/or labs/studies not listed above.)   Patient is in distress upon arrival and is not well-appearing.  He has hypertensive, vomiting, and has the worst headache of his life.  I am of course most concerned about acute intracranial bleed.  The symptoms reportedly started about 3 hours ago and a CT scan without contrast should be an appropriate initial diagnostic screening tool to rule out acute bleeding.  He has already received 2 rounds of Zofran  4 mg IV and is still actively vomiting.  I ordered droperidol  2.5 mg IV and Benadryl  25 mg IV to try to reduce the nausea and prevent vomiting at least long enough to get the CT scan.  I looked back in his history and he does not have a prior history of prolonged QTc intervals on prior EKGs.  The patient is on the cardiac monitor to evaluate for evidence of arrhythmia and/or significant heart rate changes.   Clinical Course as of 03/12/24 0714  Wed Mar 12, 2024  0647 CT Head Wo Contrast I independently viewed and interpreted the patient's head CT and I see no evidence of acute intracranial bleed.  Radiology confirms no obvious intracranial hemorrhage. [CF]  0648 BP(!): 191/96 [CF]  0713 Given the constellation of the patient's symptoms, I will further assess with MRI brain without contrast.   Discussed case with Dr. Rexford who will assume care in the emergency department [CF]    Clinical Course User Index [CF] Gordan Huxley, MD     FINAL CLINICAL IMPRESSION(S) / ED DIAGNOSES   Final diagnoses:  Bad headache  Nausea and vomiting, unspecified vomiting type  Hypertension, unspecified type     Rx / DC Orders   ED Discharge  Orders     None        Note:  This document was prepared using Dragon voice recognition software and may include unintentional dictation errors.   Gordan Huxley, MD 03/12/24 [phone]  "

## 2024-03-12 NOTE — ED Notes (Signed)
 Pt placed back on monitor and repositioned. IV re taped and new pulse ox placed

## 2024-03-12 NOTE — Procedures (Signed)
 PROCEDURE SUMMARY:  Successful fluoroscopic guided lumbar puncture at the level of L3-4.  Opening pressure was 28 cm H2O ~11 mL clear colorless fluid collected and sent for labs.  No immediate complications.  Pt tolerated well.   EBL = <60mL  Please see full dictation in imaging section of Epic for procedure details.   Electronically Signed: Frederick Klinger M Tannon Peerson, PA-C 03/12/2024, 5:21 PM

## 2024-03-12 NOTE — ED Notes (Signed)
 Non-rebreather applied at 15 liters per EDP request

## 2024-03-12 NOTE — ED Provider Notes (Signed)
" ° °  Received in sign out. Severe headache with n/v. There was concerns for confusion and AMS associated.      Dicky Anes, MD 03/12/24 1504  "

## 2024-03-12 NOTE — ED Provider Notes (Signed)
 Spoke with wife, Ms. Pichon- Estle Huguley. Advises he was ok until last night when had started throwing up, had a 'bad headache' and was so weak he could not get up. He was not confused and seemed fine the day prior, up walking, watches TV etc. Last night he started having a severe headache then general weakness prompting the 911 call. Wife advises she had symptoms of vomiting and generalized aches last couple days as well.     Dicky Anes, MD 03/12/24 [phone]

## 2024-03-12 NOTE — ED Notes (Signed)
"  Pt back from MRI  "

## 2024-03-12 NOTE — ED Provider Notes (Signed)
 Pending imaging studies to be followed by hospitalist.  CSF differential reassuring.  No red cells effectively ruling out acute subarachnoid hemorrhage though that is essentially been ruled out by imaging already, and only 2 white cells highly unlikely to represent meningitis.  Neurology consult pending.    Dicky Anes, MD 03/12/24 [phone]

## 2024-03-12 NOTE — ED Notes (Signed)
 Pt wife called and updated on pt plan of care.

## 2024-03-12 NOTE — ED Provider Notes (Signed)
 Awaiting     Differential diagnosis includes but is not exclusive to subarachnoid hemorrhage (seems highly unlikely given timing of CT versus timing of symptom onset as well as thus far reassuring imaging), meningitis, encephalitis, previous head trauma - no known trauma, cavernous venous thrombosis,temporal arteritis, migraine or migraine equivalent, etc.    Dicky Anes, MD 03/12/24 1555

## 2024-03-12 NOTE — ED Provider Notes (Signed)
 At time of admission pending studies include  CTA/CTV MRI w/ Contrast Urinalysis CSF findings including differential, protein glucose cultures  I have discontinued his nonrebreather oxygen at this time.  He has been on an adequate amount of time, and I do not see improvement.  He has alteration in mental status.  He is not able to participate well in exam.  At this time we will continue to monitor him carefully, anticipate a need for hospitalization.  I discussed that with his wife as well and she is understanding.   I do not have clear indication that this is meningitis at this time, I think we can await pending urgent lumbar puncture results prior to the decision to initiate antibiotic unless he were to have high fever, meningismus which I do not see on exam, or other finding of significant concern  ----------------------------------------- 5:44 PM on 03/12/2024 ----------------------------------------- Hospitalist reached out to me proactively advising he was previously discussed the case with Dr. Rexford, and he is comfortable with plan for admission to the hospitalist service now that lumbar puncture has been performed.  Patient will be admitted to the hospitalist service with further studies to be followed up by hospitalist Dr. KANDICE fernand Dicky Oneil, MD 03/12/24 [phone]

## 2024-03-12 NOTE — ED Provider Notes (Signed)
 Lumbar puncture smear no organisms seen.  Awaiting CSF differential/cell count   Dicky Anes, MD 03/12/24 1859

## 2024-03-12 NOTE — ED Provider Notes (Signed)
 Vitals:   03/12/24 1449 03/12/24 1500  BP:  (!) 176/99  Pulse: (!) 110 (!) 111  Resp:    Temp:    SpO2: 100%    Patient is resting eyes closed quite soft.  He alerts to stimulus.  Opens eyes but does not converse with me when conversed to.  He says things like huh but opens his eyes for a few seconds and seems to fall back to sleep.  He appears somnolent.  He is not able to participate in a normal fully alert exam at this time.  Concerned there is a significant ongoing neurologic process.  He is pending studies at this time.  Also noted he had a recent UTI.  I have ordered urinalysis, he is diabetic I will check a glucose at this time.  I have placed a page to Dr. Matthews whom my previous colleague has discussed the case with, but I am requesting a formal neurology consult at this time.  Consult paced with Dr. Matthews via pager and haiku message.  He does not appear in acute extremis.  Has been evaluated now by myself as a third physician of the day, at this juncture think best to involve neurology and further decision making regarding what was initially chief complaint of headache, now resting without distress, but sleeping with alteration mental status.   Dicky Anes, MD 03/12/24 [phone]

## 2024-03-12 NOTE — ED Notes (Signed)
 MRI called and pt is cleared and just waiting on the list. MD updated regarding delay

## 2024-03-12 NOTE — ED Provider Notes (Addendum)
 Patient admitted to hospitalist service.  On most recent vital sign check he is now borderline febrile with a temp of 100.1 Fahrenheit.  Thus far his workup has not identified a CNS source.  There was additional concern.  He has no associated abdominal pain.  He is resting and not in distress at this time.  I am concerned though, we will expand his workup to include a chest x-ray.  He is currently under the care of the hospitalist service.  Dr. KANDICE Bathe aware of low grade Fever now developing.  His CSF I believe has essentially excluded acute bacterial meningitis but certainly further testing being performed [place] and culture, but at this juncture query if it could be a different source.  He has no associated GI symptoms at this point his abdomen soft no pain.  He is restful but query if other causes such as pulmonary source or other etiology is present.  Urinalysis unrevealing of obvious infection  Will order blood cultures, lactic, CXR now.    Dicky Anes, MD 03/12/24 2249   ----------------------------------------- 10:57 PM on 03/12/2024 -----------------------------------------  Patient somewhat hypertensive tachycardic.  Low-grade fever now.  He is alert, able to give some history but seems just still slightly mildly slow and cognition are slightly confused to me but much more alert than he was several hours ago.  His abdomen is soft nontender nondistended.  He is urinating well with condom catheter.  Lung sounds clear.  He is in no frank distress but continues to have evidence of SIRS  When asked if history of alcohol use was query if this could be a withdrawal like syndrome he reports no history of alcohol use.  Reports he does not drink.  Hospitalist team aware of pending chest x-ray, cultures lactic acid.  Defer to hospitalist team who is now managing him if they do wish to start a antibiotic or provide further treatment therapy at this time, at this point I do not have an obvious source  abdomen soft nontender pending studies currently admitted.    Dicky Anes, MD 03/12/24 2259

## 2024-03-12 NOTE — ED Triage Notes (Signed)
 BIB [facility] from home with CC of headache and vomiting since 2:30am - unknown LKW.

## 2024-03-13 ENCOUNTER — Observation Stay

## 2024-03-13 DIAGNOSIS — R519 Headache, unspecified: Secondary | ICD-10-CM

## 2024-03-13 DIAGNOSIS — G9340 Encephalopathy, unspecified: Secondary | ICD-10-CM | POA: Diagnosis not present

## 2024-03-13 LAB — MENINGITIS/ENCEPHALITIS PANEL (CSF)

## 2024-03-13 LAB — BLOOD CULTURE ID PANEL (REFLEXED) - BCID2

## 2024-03-13 LAB — LACTIC ACID, PLASMA
Lactic Acid, Venous: 1.3 mmol/L (ref 0.5–1.9)
Lactic Acid, Venous: 2.2 mmol/L (ref 0.5–1.9)
Lactic Acid, Venous: 3 mmol/L (ref 0.5–1.9)

## 2024-03-13 LAB — BLOOD GAS, ARTERIAL
Acid-Base Excess: 1.4 mmol/L (ref 0.0–2.0)
Bicarbonate: 24.9 mmol/L (ref 20.0–28.0)
O2 Saturation: 97.8 %
Patient temperature: 37
pCO2 arterial: 35 mmHg (ref 32–48)
pH, Arterial: 7.46 — ABNORMAL HIGH (ref 7.35–7.45)
pO2, Arterial: 85 mmHg (ref 83–108)

## 2024-03-13 LAB — CBG MONITORING, ED
Glucose-Capillary: 213 mg/dL — ABNORMAL HIGH (ref 70–99)
Glucose-Capillary: 214 mg/dL — ABNORMAL HIGH (ref 70–99)
Glucose-Capillary: 241 mg/dL — ABNORMAL HIGH (ref 70–99)

## 2024-03-13 LAB — BASIC METABOLIC PANEL WITH GFR
Anion gap: 11 (ref 5–15)
BUN: 23 mg/dL — ABNORMAL HIGH (ref 6–20)
CO2: 26 mmol/L (ref 22–32)
Calcium: 8.5 mg/dL — ABNORMAL LOW (ref 8.9–10.3)
Chloride: 98 mmol/L (ref 98–111)
Creatinine, Ser: 0.91 mg/dL (ref 0.61–1.24)
GFR, Estimated: >60 mL/min
Glucose, Bld: 217 mg/dL — ABNORMAL HIGH (ref 70–99)
Potassium: 4.4 mmol/L (ref 3.5–5.1)
Sodium: 135 mmol/L (ref 135–145)

## 2024-03-13 LAB — CBC
HCT: 38.2 % — ABNORMAL LOW (ref 39.0–52.0)
Hemoglobin: 12.7 g/dL — ABNORMAL LOW (ref 13.0–17.0)
MCH: 30.5 pg (ref 26.0–34.0)
MCHC: 33.2 g/dL (ref 30.0–36.0)
MCV: 91.8 fL (ref 80.0–100.0)
Platelets: 257 K/uL (ref 150–400)
RBC: 4.16 MIL/uL — ABNORMAL LOW (ref 4.22–5.81)
RDW: 12.4 % (ref 11.5–15.5)
WBC: 10.7 K/uL — ABNORMAL HIGH (ref 4.0–10.5)
nRBC: 0 % (ref 0.0–0.2)

## 2024-03-13 LAB — URINE DRUG SCREEN
Amphetamines: NEGATIVE
Barbiturates: NEGATIVE
Benzodiazepines: NEGATIVE
Cocaine: NEGATIVE
Fentanyl: NEGATIVE
Methadone Scn, Ur: NEGATIVE
Opiates: POSITIVE — AB
Tetrahydrocannabinol: NEGATIVE

## 2024-03-13 LAB — ACETAMINOPHEN LEVEL: Acetaminophen (Tylenol), Serum: <10 ug/mL — ABNORMAL LOW (ref 10–30)

## 2024-03-13 LAB — STREP PNEUMONIAE URINARY ANTIGEN: Strep Pneumo Urinary Antigen: NEGATIVE

## 2024-03-13 LAB — SALICYLATE LEVEL: Salicylate Lvl: <7 mg/dL — ABNORMAL LOW (ref 7.0–30.0)

## 2024-03-13 LAB — PROCALCITONIN: Procalcitonin: <0.1 ng/mL

## 2024-03-13 LAB — HIV ANTIBODY (ROUTINE TESTING W REFLEX): HIV Screen 4th Generation wRfx: NONREACTIVE

## 2024-03-13 MED ORDER — SODIUM CHLORIDE 0.9 % IV SOLN
500.0000 mg | INTRAVENOUS | Status: DC
  Administered 2024-03-13: 500 mg via INTRAVENOUS
  Filled 2024-03-13: qty 5

## 2024-03-13 MED ORDER — LANTUS SOLOSTAR 100 UNIT/ML ~~LOC~~ SOPN: 50.0000 [IU] | PEN_INJECTOR | Freq: Every day | SUBCUTANEOUS | Status: AC

## 2024-03-13 MED ORDER — AZITHROMYCIN 500 MG PO TABS: 500.0000 mg | ORAL_TABLET | Freq: Every day | ORAL | Status: DC

## 2024-03-13 MED ORDER — NOVOLOG FLEXPEN RELION 100 UNIT/ML ~~LOC~~ SOPN: PEN_INJECTOR | SUBCUTANEOUS | Status: AC

## 2024-03-13 MED ORDER — HALOPERIDOL LACTATE 5 MG/ML IJ SOLN
INTRAMUSCULAR | Status: AC
  Administered 2024-03-13: 1 mg via INTRAVENOUS
  Filled 2024-03-13: qty 1

## 2024-03-13 MED ORDER — GADOBUTROL 1 MMOL/ML IV SOLN
10.0000 mL | Freq: Once | INTRAVENOUS | Status: AC | PRN
  Administered 2024-03-13: 10 mL via INTRAVENOUS

## 2024-03-13 MED ORDER — SODIUM CHLORIDE 0.9 % IV SOLN
2.0000 g | INTRAVENOUS | Status: DC
  Administered 2024-03-13: 2 g via INTRAVENOUS
  Filled 2024-03-13: qty 20

## 2024-03-13 MED ORDER — ENOXAPARIN SODIUM 60 MG/0.6ML IJ SOSY
50.0000 mg | PREFILLED_SYRINGE | INTRAMUSCULAR | Status: DC
  Administered 2024-03-13: 50 mg via SUBCUTANEOUS
  Filled 2024-03-13: qty 0.6

## 2024-03-13 MED ORDER — HALOPERIDOL LACTATE 5 MG/ML IJ SOLN
1.0000 mg | Freq: Four times a day (QID) | INTRAMUSCULAR | Status: AC | PRN
  Administered 2024-03-13: 1 mg via INTRAVENOUS
  Filled 2024-03-13: qty 1

## 2024-03-13 NOTE — Care Management Obs Status (Signed)
 MEDICARE OBSERVATION STATUS NOTIFICATION   Patient Details  Name: QUASEAN FRYE MRN: [id number] Date of Birth: [birth year]   Medicare Observation Status Notification Given:  Chaney BRANDY CHRISTIANE LELON, CMA 03/13/2024, 5:00 PM

## 2024-03-13 NOTE — ED Notes (Addendum)
 RN to bedside d/t bed alarm. Pt found at end of bed trying to get up, asking why am I still here, I need to get up, take me home. Pt redirected back to bed, bed alarm in place

## 2024-03-13 NOTE — ED Notes (Signed)
 Pt taking monitors off, throwing legs over siderails. Attempted to reposition

## 2024-03-13 NOTE — ED Notes (Signed)
 Pt yelling out loudly hello, is anyone there, are you going to let me starve to death. This RN to bedside, pt mumbling with eyes clothes. Does not engage in meaningful conversation. Pt NPO at this time

## 2024-03-13 NOTE — Evaluation (Signed)
 Occupational Therapy Evaluation Patient Details Name: Fernando Vega MRN: [id number] DOB: 1971/02/26 Today's Date: 03/13/2024   History of Present Illness   Fernando Vega is a 54 y.o. year old male with medical history of hypertension, hyperlipidemia, type 2 diabetes, history of diabetic foot infection status post transmetatarsal amputation, cluster headaches, presenting to the ED with severe headache that started overnight. Headache was described as most severe headache of his life. Headache was localized to his right eye. Patient was noted to be altered. On arrival to the ED patient was noted to be HDS stable. CT head without contrast obtained that did not show any acute findings. MRI was limited due to being terminated early but did not show any acute findings. CTA of head did not show any acute findings.     Clinical Impressions Pt presents this AM somewhat lethargic, fully oriented to self, place, time, situation. He is able to follow directions with cueing, requires SUPV for bed mobility, transfers, ambulation, UB dressing. Pt's standing balance is fair; however, anticipate it would be improved after pt dons speciality shoe for transmetatarsal amputation. Pt appears near his baseline level of fxl mobility. Do not anticipate need for OT services post DC. Will keep pt on rehab caseload, however, while hospitalized, to prevent deconditioning and to observe balance and pt's ability to perform grooming, toileting, dressing, etc., after family brings in pt's shoe and cane.      If plan is discharge home, recommend the following:   A little help with walking and/or transfers;A little help with bathing/dressing/bathroom     Functional Status Assessment   Patient has had a recent decline in their functional status and demonstrates the ability to make significant improvements in function in a reasonable and predictable amount of time.     Equipment Recommendations   None recommended by OT      Recommendations for Other Services         Precautions/Restrictions         Mobility Bed Mobility Overal bed mobility: Needs Assistance Bed Mobility: Supine to Sit, Sit to Supine     Supine to sit: Supervision Sit to supine: Supervision        Transfers Overall transfer level: Needs assistance Equipment used: 1 person hand held assist Transfers: Sit to/from Stand Sit to Stand: Supervision                  Balance Overall balance assessment: Needs assistance Sitting-balance support: Single extremity supported Sitting balance-Leahy Scale: Good     Standing balance support: No upper extremity supported Standing balance-Leahy Scale: Fair Standing balance comment: Pt's SPC and speciality shoe not available in room; anticipate that pt's balance would have been improved with his usual footware                           ADL either performed or assessed with clinical judgement   ADL Overall ADL's : Needs assistance/impaired Eating/Feeding: Independent               Upper Body Dressing : Supervision/safety                           Vision         Perception         Praxis         Pertinent Vitals/Pain Pain Assessment Pain Assessment: No/denies pain     Extremity/Trunk Assessment Upper  Extremity Assessment Upper Extremity Assessment: Overall WFL for tasks assessed   Lower Extremity Assessment Lower Extremity Assessment: Defer to PT evaluation   Cervical / Trunk Assessment Cervical / Trunk Assessment: Normal   Communication Communication Factors Affecting Communication: Difficulty expressing self   Cognition Arousal: Lethargic Behavior During Therapy: WFL for tasks assessed/performed Cognition: Difficult to assess             OT - Cognition Comments: Pt lethargic, limited engagement in conversation. Largely able to follow directions, but requires verbal and tactile cueing                          Cueing  General Comments          Exercises Other Exercises Other Exercises: Educ re: PoC, DC recs, importance of using can for all ambulation until balance is improved   Shoulder Instructions      Home Living Family/patient expects to be discharged to:: Private residence Living Arrangements: Spouse/significant other Available Help at Discharge: Family Type of Home: House Home Access: Stairs to enter Secretary/administrator of Steps: 3 Entrance Stairs-Rails: None Home Layout: One level     Bathroom Shower/Tub: Chief Strategy Officer: Standard     Home Equipment: Cane - single point;Wheelchair - Forensic Psychologist (2 wheels);Grab bars - tub/shower;Grab bars - toilet          Prior Functioning/Environment Prior Level of Function : Independent/Modified Independent             Mobility Comments: Walks with cane in community, largely does not use DME in home ADLs Comments: Pt and wife report he is IND in B/IADLs, sits down in tub for baths. Drives, cooks, clean. Pt is on disability/not working    OT Problem List: Decreased strength;Decreased activity tolerance   OT Treatment/Interventions: Self-care/ADL training;Therapeutic activities;Patient/family education;Balance training      OT Goals(Current goals can be found in the care plan section)   ADL Goals Pt Will Perform Lower Body Bathing: with modified independence Pt Will Transfer to Toilet: with modified independence Pt Will Perform Toileting - Clothing Manipulation and hygiene: with modified independence   OT Frequency:  Min 1X/week    Co-evaluation              AM-PAC OT 6 Clicks Daily Activity     Outcome Measure Help from another person eating meals?: None Help from another person taking care of personal grooming?: None Help from another person toileting, which includes using toliet, bedpan, or urinal?: A Little Help from another person bathing (including washing,  rinsing, drying)?: A Little Help from another person to put on and taking off regular upper body clothing?: None Help from another person to put on and taking off regular lower body clothing?: A Little 6 Click Score: 21   End of Session    Activity Tolerance: Patient tolerated treatment well Patient left: in bed;with nursing/sitter in room;with family/visitor present  OT Visit Diagnosis: Unsteadiness on feet (R26.81);Muscle weakness (generalized) (M62.81)                Time: 8986-8962 OT Time Calculation (min): 24 min Charges:  OT General Charges $OT Visit: 1 Visit OT Evaluation $OT Eval Moderate Complexity: 1 Mod OT Treatments $Self Care/Home Management : 8-22 mins Suzen Hock, PhD, MS, OTR/L 03/13/2024, 11:35 AM

## 2024-03-13 NOTE — Progress Notes (Signed)
 PHARMACIST - PHYSICIAN COMMUNICATION  CONCERNING:  Enoxaparin  (Lovenox ) for DVT Prophylaxis    RECOMMENDATION: Patient was prescribed enoxaprin 40mg  q24 hours for VTE prophylaxis.   Filed Weights   03/12/24 0610  Weight: 97.7 kg (215 lb 6.2 oz)    Body mass index is 33.73 kg/m.  Estimated Creatinine Clearance: 102.1 mL/min (by C-G formula based on SCr of 0.91 mg/dL).   Based on [facility] policy patient is candidate for enoxaparin  0.5mg /kg TBW SQ every 24 hours based on BMI being >30.  DESCRIPTION: Pharmacy has adjusted enoxaparin  dose per [facility] policy.  Patient is now receiving enoxaparin  50 mg every 24 hours    Kayla JULIANNA Blew, PharmD Clinical Pharmacist  03/13/2024 10:32 AM

## 2024-03-13 NOTE — ED Notes (Signed)
 Pt to MRI with [place] NT

## 2024-03-13 NOTE — Consult Note (Signed)
 NEUROLOGY CONSULT NOTE   Date of service: March 13, 2024 Patient Name: Fernando Vega MRN:  [id number] DOB:  05-Sep-1970 Chief Complaint: Headache Requesting Provider: Awanda City, MD  History of Present Illness  Fernando Vega is a 54 y.o. male with remote hx of migraines as well as more frequent 15 to 20-minute headaches who presents with a severe headache that started yesterday.  He states that from the time of onset to the time of it being at its worst was about 20 minutes.  In the emergency department he was treated with 50 mg of diphenhydramine  1.25 mg of droperidol , 8 mg of dexamethasone , 30 mg of ketorolac , 1 mg of Ativan, 10 mg of Reglan , 4 mg of morphine , 6 mg of sumatriptan, 1 g of Depacon.  He has received two doses of Haldol overnight.  Associated with a headache, he did have some nausea, blurred vision, and light does bother him but he states that it bothers him most of the time.  He reports that his headache is gone, and family reports that he is back to baseline, though while in with him he does continuously pick at things.  His son notes that the patient's tremor seems a little worse than normal, but otherwise feels he is back to baseline.  I asked the patient and he does notice sometimes when he is drinking that his hands shake slightly.  Past History   Past Medical History:  Diagnosis Date   Diabetes mellitus without complication (HCC)    Hypertension    Neuropathy    Sarcoidosis    Sleep apnea     Past Surgical History:  Procedure Laterality Date   AMPUTATION Right 12/13/2020   Procedure: AMPUTATION midFOOT-RIGHT;  Surgeon: Silva Juliene JONELLE, DPM;  Location: [facility] ORS;  Service: Podiatry;  Laterality: Right;   AMPUTATION Right 11/05/2020   Procedure: AMPUTATION RAY-Partial Ray Amp 2nd & 3rd Digit;  Surgeon: Janit Thresa HERO, DPM;  Location: [facility] ORS;  Service: Podiatry;  Laterality: Right;   APPLICATION OF WOUND VAC Right 11/09/2020   Procedure: APPLICATION OF WOUND VAC;   Surgeon: Janit Thresa HERO, DPM;  Location: [facility];  Service: Podiatry;  Laterality: Right;   BONE BIOPSY Right 01/08/2021   Procedure: BONE BIOPSY;  Surgeon: Gershon Donnice JONELLE, DPM;  Location: [facility] ORS;  Service: Podiatry;  Laterality: Right;   COLONOSCOPY WITH PROPOFOL  N/A 07/19/2021   Procedure: COLONOSCOPY WITH PROPOFOL ;  Surgeon: Therisa Bi, MD;  Location: [facility] ENDOSCOPY;  Service: Gastroenterology;  Laterality: N/A;   COLONOSCOPY WITH PROPOFOL  N/A 11/23/2021   Procedure: COLONOSCOPY WITH PROPOFOL ;  Surgeon: Therisa Bi, MD;  Location: [facility] ENDOSCOPY;  Service: Gastroenterology;  Laterality: N/A;   COLONOSCOPY WITH PROPOFOL  N/A 02/07/2023   Procedure: COLONOSCOPY WITH PROPOFOL ;  Surgeon: Therisa Bi, MD;  Location: [facility] ENDOSCOPY;  Service: Gastroenterology;  Laterality: N/A;   INCISION AND DRAINAGE Right 11/01/2020   Procedure: INCISION AND DRAINAGE-Right Foot;  Surgeon: Silva Juliene JONELLE, DPM;  Location: [facility] ORS;  Service: Podiatry;  Laterality: Right;   IRRIGATION AND DEBRIDEMENT FOOT Right 11/09/2020   Procedure: IRRIGATION AND DEBRIDEMENT FOOT;  Surgeon: Janit Thresa HERO, DPM;  Location: [facility] ORS;  Service: Podiatry;  Laterality: Right;   IRRIGATION AND DEBRIDEMENT FOOT Right 12/13/2020   Procedure: IRRIGATION AND DEBRIDEMENT FOOT;  Surgeon: Silva Juliene JONELLE, DPM;  Location: [facility] ORS;  Service: Podiatry;  Laterality: Right;   TRANSMETATARSAL AMPUTATION Right 01/08/2021   Procedure: LISFRANC  AMPUTATION;  Surgeon: Gershon Donnice JONELLE, DPM;  Location: [facility]  ORS;  Service: Podiatry;  Laterality: Right;   WOUND DEBRIDEMENT Right 12/16/2020   Procedure: DEBRIDEMENT WOUND;  Surgeon: Tobie Franky SQUIBB, DPM;  Location: [facility] ORS;  Service: Podiatry;  Laterality: Right;  Transmet amputation washout & closure   WOUND DEBRIDEMENT Right 01/08/2021   Procedure: DEBRIDEMENT WOUND;  Surgeon: Gershon Donnice SAUNDERS, DPM;  Location: [facility] ORS;  Service: Podiatry;  Laterality: Right;    Family History: Family History   Problem Relation Age of Onset   Hypertension Mother    Diabetes type II Father    Parkinson's disease Father    Heart disease Father     Social History  reports that he has never smoked. He has never been exposed to tobacco smoke. He has never used smokeless tobacco. He reports that he does not drink alcohol and does not use drugs.  Allergies[1]  Medications  Current Medications[2]  Vitals   Vitals:   03/13/24 0500 03/13/24 0533 03/13/24 0600 03/13/24 0830  BP: (!) 144/73  (!) 150/63 (!) 132/37  Pulse: (!) 117  (!) 122   Resp:   18   Temp:  99.2 F (37.3 C)    TempSrc:  Axillary    SpO2:   96%   Weight:      Height:        Body mass index is 33.73 kg/m.   Physical Exam   In bed, no apparent distress Neurologic Examination   Throughout my exam, he holds his hand in front of his eyes to block the light.  Neuro: Mental Status: Patient is awake, alert, oriented to person, place, month, year, and situation. No signs of aphasia or neglect Cranial Nerves: II: Visual Fields are full.  III,IV, VI: EOMI without ptosis or diploplia.  V: Facial sensation is symmetric to temperature VII: Facial movement is symmetric.  VIII: hearing is intact to voice X: Uvula elevates symmetrically XII: tongue is midline without atrophy or fasciculations.  Motor: Tone is normal. Bulk is normal. 5/5 strength was present in all four extremities.  Sensory: Sensation is symmetric to light touch and temperature in the arms and legs. Cerebellar: He has mild postural tremor bilaterally       Labs/Imaging/Neurodiagnostic studies   CBC:  Recent Labs  Lab Mar 20, 2024 0556 03/13/24 0643  WBC 9.2 10.7*  NEUTROABS 7.4  --   HGB 13.3 12.7*  HCT 40.1 38.2*  MCV 91.8 91.8  PLT 223 257   Basic Metabolic Panel:  Lab Results  Component Value Date   NA 135 03/13/2024   K 4.4 03/13/2024   CO2 26 03/13/2024   GLUCOSE 217 (H) 03/13/2024   BUN 23 (H) 03/13/2024   CREATININE 0.91  03/13/2024   CALCIUM  8.5 (L) 03/13/2024   GFRNONAA >60 03/13/2024   GFRAA >60 09/01/2019   Lipid Panel:  Lab Results  Component Value Date   LDLCALC 70 12/03/2012   HgbA1c:  Lab Results  Component Value Date   HGBA1C 8.8 (H) 03-20-24   Urine Drug Screen:     Component Value Date/Time   LABOPIA POSITIVE (A) 03/13/2024 0054   COCAINSCRNUR NEGATIVE 03/13/2024 0054   LABBENZ NEGATIVE 03/13/2024 0054   AMPHETMU NEGATIVE 03/13/2024 0054   THCU NEGATIVE 03/13/2024 0054   LABBARB NEGATIVE 03/13/2024 0054    Alcohol Level     Component Value Date/Time   ETH <15 03-20-24 0556   INR  Lab Results  Component Value Date   INR 1.0 03/20/24   APTT  Lab Results  Component Value Date  APTT 35 12/12/2020     MRI Brain(Personally reviewed): No acute findings  He also received CT/CTV/CTA head and neck which were all read as negative for findings that would contribute to his current presentation.  ASSESSMENT   KAYZEN KENDZIERSKI is a 54 y.o. male with rapid onset severe headache.  He does have a history of migraines, and though the headache was rapid in onset, it was not truly thunderclap.  He has no evidence of meningitis on LP, RCVS on CTA, subarachnoid hemorrhage on CT within 6 hours of onset or an LP, negative venogram, no abnormal enhancement on enhanced MRI, and no evidence of inflammation on CRP (and I do not think the mild elevation of his sed rate is significant), I do not think that I have any further testing needed to rule out more serious causes of headache.  With the amount of medication that he received including narcotics/Benadryl /neuroleptics/steroids, my suspicion is that the confusion seen after arrival yesterday which is now resolved was medication related.  With resolution of his headache and now back to normal, and extensive negative workup, I do not have any further recommendations at this time.  RECOMMENDATIONS  No further recommendations at this time, please  call if further questions or concerns. ______________________________________________________________________    Bonney Aisha Seals, MD [facility]    [1]  Allergies Allergen Reactions   Tirzepatide    Gramineae Pollens Other (See Comments)    Sinus  [2]  Current Facility-Administered Medications:    acetaminophen  (TYLENOL ) tablet 650 mg, 650 mg, Oral, Q6H PRN **OR** acetaminophen  (TYLENOL ) suppository 650 mg, 650 mg, Rectal, Q6H PRN, Fernand Prost, MD   enoxaparin  (LOVENOX ) injection 50 mg, 50 mg, Subcutaneous, Q24H, Awanda City, MD   insulin  aspart (novoLOG ) injection 0-5 Units, 0-5 Units, Subcutaneous, QHS, Khan, Ghalib, MD, 2 Units at 03/12/24 2223   insulin  aspart (novoLOG ) injection 0-9 Units, 0-9 Units, Subcutaneous, TID WC, Fernand Prost, MD, 3 Units at 03/13/24 9286   lactated ringers  infusion, , Intravenous, Continuous, Fernand Prost, MD, Last Rate: 75 mL/hr at 03/13/24 0414, Rate Verify at 03/13/24 0414   ondansetron  (ZOFRAN ) tablet 4 mg, 4 mg, Oral, Q6H PRN **OR** ondansetron  (ZOFRAN ) injection 4 mg, 4 mg, Intravenous, Q6H PRN, Fernand Prost, MD   senna-docusate (Senokot-S) tablet 1 tablet, 1 tablet, Oral, QHS PRN, Fernand Prost, MD   sodium chloride  flush (NS) 0.9 % injection 3 mL, 3 mL, Intravenous, Q12H, Fernand Prost, MD, 3 mL at 03/12/24 2228  Current Outpatient Medications:    acetaminophen  (TYLENOL ) 500 MG tablet, Take 1 tablet (500 mg total) by mouth every 6 (six) hours as needed for mild pain (or Fever >/= 101)., Disp: 30 tablet, Rfl: 0   albuterol  (VENTOLIN  HFA) 108 (90 Base) MCG/ACT inhaler, VENTOLIN  HFA 108 (90 Base) MCG/ACT AERS, Disp: , Rfl:    alfuzosin  (UROXATRAL ) 10 MG 24 hr tablet, Take 1 tablet (10 mg total) by mouth daily with breakfast., Disp: 30 tablet, Rfl: 11   amLODipine  (NORVASC ) 5 MG tablet, Take 5 mg by mouth daily., Disp: , Rfl:    Ascorbic Acid  (VITAMIN C WITH ROSE HIPS) 500 MG tablet, Take 500 mg by mouth daily., Disp: , Rfl:     atorvastatin  (LIPITOR) 20 MG tablet, Take 20 mg by mouth daily., Disp: , Rfl:    BACTRIM  DS 800-160 MG tablet, BACTRIM  DS 800-160 MG TABS, Disp: , Rfl:    fluticasone  (FLONASE ) 50 MCG/ACT nasal spray, Place 1 spray into both nostrils 2 (two) times daily., Disp: ,  Rfl:    JARDIANCE 10 MG TABS tablet, Take 10 mg by mouth daily., Disp: , Rfl:    losartan  (COZAAR ) 25 MG tablet, Take 25 mg by mouth daily., Disp: , Rfl:    MOUNJARO 2.5 MG/0.5ML Pen, MOUNJARO 2.5 MG/0.5ML SOAJ, Disp: , Rfl:    Multiple Vitamin (MULTIVITAMIN WITH MINERALS) TABS tablet, Take 1 tablet by mouth once daily., Disp: 30 tablet, Rfl: 0   Olopatadine HCl 0.2 % SOLN, Apply 1 drop to eye daily., Disp: , Rfl:    pantoprazole  (PROTONIX ) 40 MG tablet, Take 1 tablet (40 mg total) by mouth once daily., Disp: 30 tablet, Rfl: 2   sildenafil  (REVATIO ) 20 MG tablet, Take 3-5 tablets about one hour prior to intercourse. Do not exceed 5 tablets per dose., Disp: 30 tablet, Rfl: 0   ZYRTEC  ALLERGY 10 MG tablet, ZYRTEC  ALLERGY 10 MG TABS, Disp: , Rfl:    amoxicillin -clavulanate (AUGMENTIN ) 875-125 MG tablet, Take 1 tablet by mouth 2 (two) times daily. (Patient not taking: Reported on 03/12/2024), Disp: 14 tablet, Rfl: 0   Continuous Glucose Receiver (DEXCOM G7 RECEIVER) DEVI, SMARTSIG:1 Unit(s) As Directed, Disp: , Rfl:    Continuous Glucose Sensor (DEXCOM G7 SENSOR) MISC, SMARTSIG:1 device Topical Once a Month, Disp: , Rfl:    gentamicin  ointment (GARAMYCIN ) 0.1 %, Apply 1 Application topically daily. Apply to wound daily (Patient not taking: Reported on 03/12/2024), Disp: 30 g, Rfl: 0   Glucagon (GVOKE HYPOPEN 2-PACK) 1 MG/0.2ML SOAJ, GVOKE HYPOPEN 2-PACK 1 MG/0.2ML SOAJ, Disp: , Rfl:    insulin  glargine (LANTUS  SOLOSTAR) 100 UNIT/ML Solostar Pen, LANTUS  SOLOSTAR 100 UNIT/ML SOPN, Disp: , Rfl:    insulin  lispro (HUMALOG  KWIKPEN) 100 UNIT/ML KwikPen, Inject 7 Units into the skin 3 (three) times daily before meals. (Patient taking differently:  Inject 12 Units into the skin 3 (three) times daily before meals.), Disp: 15 mL, Rfl: 3   ketoconazole  (NIZORAL ) 2 % cream, Apply to both feet and between toes once daily for 6 weeks. (Patient not taking: Reported on 03/12/2024), Disp: 60 g, Rfl: 1   [employer] PEN NEEDLES 32G X 4 MM MISC, [employer] PEN NEEDLES 32G X 4 MM, Disp: , Rfl:    Lancets Ultra Thin MISC, LANCETS ULTRA THIN, Disp: , Rfl:    NOVOLOG  FLEXPEN 100 UNIT/ML FlexPen, SMARTSIG:12 Unit(s) SUB-Q 3 Times Daily, Disp: , Rfl:    Potassium Chloride ER 20 MEQ TBCR, POTASSIUM CHLORIDE ER 20 MEQ CR-TABS (Patient not taking: Reported on 03/12/2024), Disp: , Rfl:    SEMGLEE , YFGN, 100 UNIT/ML Pen, Inject into the skin daily., Disp: , Rfl:

## 2024-03-13 NOTE — Discharge Summary (Signed)
 "  Physician Discharge Summary   Fernando Vega  male DOB: 07-Nov-1970  FMW:[id number]  PCP: [facility]  Admit date: 03/12/2024 Discharge date: 03/13/2024  Admitted From: home Disposition:  home CODE STATUS: Full code  Discharge Instructions     Diet Carb Modified   Complete by: As directed       Hospital Course:  For full details, please see H&P, progress notes, consult notes and ancillary notes.  Briefly,  Fernando Vega is a 54 y.o. year old male with medical history of hypertension, hyperlipidemia, type 2 diabetes, cluster headaches presenting to the ED with severe headache that started overnight.    Headache was localized to his right eye.  Patient was noted to be altered.  On arrival to the ED patient was noted to be HDS stable.  CT head without contrast obtained that did not show any acute findings.  Neurology consulted by EDP.   Headache --extensive workup showed no evidence of meningitis on LP, CTA head/neck showed no finding to explain his headache, MRI brain no acute finding, negative venogram, and no evidence of inflammation on CRP. --Pt received 50 mg of diphenhydramine  1.25 mg of droperidol , 8 mg of dexamethasone , 30 mg of ketorolac , 1 mg of Ativan , 10 mg of Reglan , 4 mg of morphine , 6 mg of sumatriptan , 1 g of Depacon .   --Headache resolved the next day.  Neuro cleared pt for discharge.  Acute encephalopathy  --pt became confused and agitated, likely due to the large amount of medications given (see above).  Pt then received IV haldol  for this agitation. --mental status back to normal the next day, and pt asked to be discharged.   Type 2 diabetes mellitus:  --resume home diabetic regimen as below after discharge.   Hypertension:  --resume home regimen as below after discharge.   Hyperlipidemia:  --resume home statin after discharge.   Unless noted above, medications under STOP list are ones pt was not taking PTA.  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)   Sarcoidosis   BPH (benign prostatic hyperplasia)     Discharge Instructions:  Allergies as of 03/13/2024       Reactions   Tirzepatide    Gramineae Pollens Other (See Comments)   Sinus        Medication List     STOP taking these medications    amoxicillin -clavulanate 875-125 MG tablet Commonly known as: AUGMENTIN    Bactrim  DS 800-160 MG tablet Generic drug: sulfamethoxazole -trimethoprim    gentamicin  ointment 0.1 % Commonly known as: GARAMYCIN    insulin  lispro 100 UNIT/ML KwikPen Commonly known as: HumaLOG  KwikPen   ketoconazole  2 % cream Commonly known as: NIZORAL    Potassium Chloride ER 20 MEQ Tbcr   Semglee  (yfgn) 100 UNIT/ML Pen Generic drug: insulin  glargine-yfgn       TAKE these medications    acetaminophen  500 MG tablet Commonly known as: TYLENOL  Take 1 tablet (500 mg total) by mouth every 6 (six) hours as needed for mild pain (or Fever >/= 101).   alfuzosin  10 MG 24 hr tablet Commonly known as: UROXATRAL  Take 1 tablet (10 mg total) by mouth daily with breakfast.   amLODipine  5 MG tablet Commonly known as: NORVASC  Take 5 mg by mouth daily.   atorvastatin  20 MG tablet Commonly known as: LIPITOR  Take 20 mg by mouth daily.   Dexcom G7 Receiver Devi SMARTSIG:1 Unit(s) As Directed   Dexcom G7 Sensor Misc SMARTSIG:1 device Topical Once a Month   fluticasone  50 MCG/ACT nasal spray Commonly known as: FLONASE  Place 1 spray into both nostrils 2 (two) times daily.   Gvoke HypoPen 2-Pack 1 MG/0.2ML Soaj Generic drug: Glucagon GVOKE HYPOPEN 2-PACK 1 MG/0.2ML SOAJ   Jardiance 10 MG Tabs tablet Generic drug: empagliflozin Take 10 mg by mouth daily.   [employer] Pen Needles 32G X 4 MM Misc Generic drug: Insulin   Pen Needle KROGER PEN NEEDLES 32G X 4 MM   Lancets Ultra Thin Misc LANCETS ULTRA THIN   Lantus  SoloStar 100 UNIT/ML Solostar Pen Generic drug: insulin  glargine Inject 50 Units into the skin daily. Home med. What changed: See the new instructions.   losartan  25 MG tablet Commonly known as: COZAAR  Take 25 mg by mouth daily.   Mounjaro 2.5 MG/0.5ML Pen Generic drug: tirzepatide MOUNJARO 2.5 MG/0.5ML SOAJ   multivitamin with minerals Tabs tablet Take 1 tablet by mouth once daily.   NovoLOG  FlexPen 100 UNIT/ML FlexPen Generic drug: insulin  aspart Four times daily. 15-15-15-20. What changed: See the new instructions.   Olopatadine HCl 0.2 % Soln Apply 1 drop to eye daily.   pantoprazole  40 MG tablet Commonly known as: PROTONIX  Take 1 tablet (40 mg total) by mouth once daily.   sildenafil  20 MG tablet Commonly known as: REVATIO  Take 3-5 tablets about one hour prior to intercourse. Do not exceed 5 tablets per dose.   Ventolin  HFA 108 (90 Base) MCG/ACT inhaler Generic drug: albuterol  VENTOLIN  HFA 108 (90 Base) MCG/ACT AERS   vitamin C with rose hips 500 MG tablet Take 500 mg by mouth daily.   ZyrTEC  Allergy 10 MG tablet Generic drug: cetirizine  ZYRTEC  ALLERGY 10 MG TABS         Follow-up Information     [facility] Follow up in 1 week(s).   Contact information: [address] [place] [address] [phone]                 Allergies[1]   The results of significant diagnostics from this hospitalization (including imaging, microbiology, ancillary and laboratory) are listed below for reference.   Consultations:   Procedures/Studies: MR BRAIN W WO CONTRAST Result Date: 03/13/2024 EXAM: MRI BRAIN WITH AND WITHOUT CONTRAST 03/13/2024 03:55:00 AM TECHNIQUE: Multiplanar multisequence MRI of the head/brain was performed with and without the administration of 10 mL gadobutrol  (GADAVIST ) 1 MMOL/ML injection 10 mL GADOBUTROL  1  MMOL/ML IV SOLN. COMPARISON: MRI of the head dated 03/12/2024. CLINICAL HISTORY: Neuro deficit, acute, stroke suspected; worst headache of his life, altered, possibly post-ictal, stroke? hx malignancy. prior MRI wo was truncated. FINDINGS: BRAIN AND VENTRICLES: No acute infarct. No acute intracranial hemorrhage. No mass effect or midline shift. No hydrocephalus. The sella is unremarkable. Normal flow voids. Mild periventricular and subcortical white matter disease present. There is no abnormal parenchymal or meningeal enhancement. The study is somewhat limited by patient motion, but is nonetheless diagnostic. ORBITS: No acute abnormality. SINUSES: No acute abnormality. BONES AND SOFT TISSUES: Normal bone marrow signal and enhancement. No acute soft tissue abnormality. IMPRESSION: 1. No acute intracranial abnormality. 2. Motion degraded examination, but diagnostic. 3. Mild periventricular and subcortical white matter disease. Electronically signed by: Evalene Coho MD 03/13/2024 04:04 AM EST RP Workstation: [id number]   DG Chest Portable 1 View Result Date: 03/12/2024 EXAM: 1 VIEW(S) XRAY OF THE CHEST 03/12/2024 10:57:00 PM  COMPARISON: 09/05/2023 CLINICAL HISTORY: sepsis eval, fever, headache FINDINGS: LINES, TUBES AND DEVICES: Right chest Port-A-Cath removed. LUNGS AND PLEURA: Lower lung volumes. There are minimal strandy opacities in the right midlung and left lower lung which may be related to infection. No pleural effusion. No pneumothorax. HEART AND MEDIASTINUM: No acute abnormality of the cardiac and mediastinal silhouettes. BONES AND SOFT TISSUES: No acute osseous abnormality. Paucity of bowel gas in visible abdomen. IMPRESSION: 1. Minimal strandy opacities in the right midlung and left lower lung, which may reflect infection. Electronically signed by: Greig Pique MD 03/12/2024 11:32 PM EST RP Workstation: [id number]   CT VENOGRAM HEAD Result Date: 03/12/2024 EXAM: CT VENOGRAM WITH CONTRAST  03/12/2024 06:38:38 PM TECHNIQUE: CT venogram of the head/brain was performed with the administration of intravenous contrast. Multiplanar reformatted images are provided for review. MIP images are provided for review. Automated exposure control, iterative reconstruction, and/or weight based adjustment of the mA/kV was utilized to reduce the radiation dose to as low as reasonably achievable. COMPARISON: None available. CLINICAL HISTORY: HA, NV, neuro recommended HA, NV, neuro recommended FINDINGS: BRAIN/VENTRICLES: No acute intracranial hemorrhage. No extra axial fluid collection. Gray-white differentiation is maintained. No mass effect or midline shift. No hydrocephalus. ORBITS: No acute abnormality. SINUSES AND MASTOIDS: No acute abnormality. SOFT TISSUES AND SKULL: No acute abnormality. CT VENOGRAM: No dural venous sinus thrombosis. No significant stenosis. IMPRESSION: 1. No evidence of dural venous sinus thrombosis or stenosis. Electronically signed by: Donnice Mania MD 03/12/2024 07:08 PM EST RP Workstation: [id number]   CT Angio Head W or Wo Contrast Result Date: 03/12/2024 EXAM: CT HEAD WITHOUT CTA HEAD WITH AND WITHOUT 03/12/2024 06:38:38 PM TECHNIQUE: CT of the head was performed without the administration of intravenous contrast followed by a CTA of the head was performed with and without the administration of 75 mL of iohexol  (OMNIPAQUE ) 350 MG/ML injection. Multiplanar 2D and/or 3D reformatted images are provided for review. Automated exposure control, iterative reconstruction, and/or weight based adjustment of the mA/kV was utilized to reduce the radiation dose to as low as reasonably achievable. COMPARISON: MRI head and CT head 03/12/2024 CLINICAL HISTORY: HA, NV, AMS FINDINGS: CT HEAD: BRAIN AND VENTRICLES: There is no acute intracranial hemorrhage, mass effect or midline shift. No abnormal extra-axial fluid collection. No evidence of acute infarct. There is no hydrocephalus. There is no abnormal  enhancement. ORBITS: The visualized portion of the orbits demonstrate no acute abnormality. SINUSES: Mucosal thickening within the left sphenoid sinus. The visualized mastoid air cells demonstrate no acute abnormality. SOFT TISSUES AND SKULL: No acute abnormality of the visualized skull or soft tissues. CTA HEAD: ANTERIOR CIRCULATION: Atherosclerosis of the bilateral carotid siphons with mild stenosis of the bilateral cavernous ICAs. There is moderate stenosis of the right supraclinoid ICA and mild to moderate stenosis of the left supraclinoid ICA. No significant stenosis of the anterior cerebral arteries. No significant stenosis of the middle cerebral arteries. No aneurysm. POSTERIOR CIRCULATION: No significant stenosis of the posterior cerebral arteries. No significant stenosis of the basilar artery. No significant stenosis of the vertebral arteries. No aneurysm. OTHER: No dural venous sinus thrombosis on this non-dedicated study. IMPRESSION: 1. No large vessel occlusion of the intracranial arteries. 2. Atherosclerosis of the bilateral carotid siphons with mild stenosis of the bilateral cavernous ICAs, moderate stenosis of the right supraclinoid ICA, and mild to moderate stenosis of the left supraclinoid ICA. Electronically signed by: Donnice Mania MD 03/12/2024 06:55 PM EST RP Workstation: [id number]   MR BRAIN WO CONTRAST Result Date:  03/12/2024 EXAM: MRI BRAIN WITHOUT CONTRAST 03/12/2024 11:17:45 AM TECHNIQUE: Multiplanar multisequence MRI of the head/brain was performed without the administration of intravenous contrast. The patient terminated the examination prior to completion. Axial DWI, Sagittal T1, and Axial T2 sequences were obtained. COMPARISON: Head CT 03/12/2024. CLINICAL HISTORY: Headache, increasing frequency or severity; Headache, neuro deficit; Headache, new onset (Age >= 51y). Nausea, vomiting, and photophobia. FINDINGS: BRAIN AND VENTRICLES: There is no acute infarct. No midline shift or  other intracranial mass effect is evident. There is no hydrocephalus or extra-axial fluid collection. Cerebral volume is within normal limits for age. There may be small chronic bilateral cerebellar infarcts. Assessment for intracranial hemorrhage is limited on this truncated examination. Major intracranial vascular flow voids are preserved. ORBITS: No acute abnormality. SINUSES AND MASTOIDS: Mild mucosal thickening in the paranasal sinuses. Trace right mastoid fluid. BONES AND SOFT TISSUES: Normal marrow signal. No acute soft tissue abnormality. IMPRESSION: 1. No acute intracranial abnormality identified on this incomplete examination. Electronically signed by: Dasie Hamburg MD 03/12/2024 11:31 AM EST RP Workstation: [id number]   CT Head Wo Contrast Result Date: 03/12/2024 EXAM: CT HEAD WITHOUT CONTRAST 03/12/2024 06:04:27 AM TECHNIQUE: CT of the head was performed without the administration of intravenous contrast. Automated exposure control, iterative reconstruction, and/or weight based adjustment of the mA/kV was utilized to reduce the radiation dose to as low as reasonably achievable. COMPARISON: 09/09/2023 CLINICAL HISTORY: Headache, new onset (Age >= 51y); Headache, sudden, severe FINDINGS: BRAIN AND VENTRICLES: No acute hemorrhage. No evidence of acute infarct. No hydrocephalus. No extra-axial collection. No mass effect or midline shift. There is mild periventricular white matter disease. There are calcifications within the carotid siphons. ORBITS: No acute abnormality. SINUSES: There is circumferential mucosal disease within the floor of the left sphenoid sinus. SOFT TISSUES AND SKULL: No acute soft tissue abnormality. No skull fracture. IMPRESSION: 1. No acute intracranial abnormality. 2. Mild periventricular white matter disease, most compatible with chronic microvascular ischemic change. 3. Carotid siphon calcifications, compatible with atherosclerosis. 4. Circumferential mucosal disease within the  floor of the left sphenoid sinus. Electronically signed by: Evalene Coho MD 03/12/2024 06:18 AM EST RP Workstation: [id number]      Labs: BNP (last 3 results) No results for input(s): BNP in the last 8760 hours. Basic Metabolic Panel: Recent Labs  Lab 03/12/24 0556 03/12/24 0748 03/13/24 0643  NA 134*  --  135  K 4.8  --  4.4  CL 96*  --  98  CO2 25  --  26  GLUCOSE 274*  --  217*  BUN 23*  --  23*  CREATININE 0.88  --  0.91  CALCIUM  8.8*  --  8.5*  MG 1.9 1.8  --    Liver Function Tests: Recent Labs  Lab 03/12/24 0556  AST 18  ALT 15  ALKPHOS 79  BILITOT 0.6  PROT 7.3  ALBUMIN 4.3   Recent Labs  Lab 03/12/24 0556  LIPASE 127*   Recent Labs  Lab 03/12/24 1720  AMMONIA 22   CBC: Recent Labs  Lab 03/12/24 0556 03/13/24 0643  WBC 9.2 10.7*  NEUTROABS 7.4  --   HGB 13.3 12.7*  HCT 40.1 38.2*  MCV 91.8 91.8  PLT 223 257   Cardiac Enzymes: No results for input(s): CKTOTAL, CKMB, CKMBINDEX, TROPONINI in the last 168 hours. BNP: Invalid input(s): POCBNP CBG: Recent Labs  Lab 03/12/24 1532 03/12/24 2222 03/13/24 0033 03/13/24 0709 03/13/24 1111  GLUCAP 214* 233* 241* 214* 213*   D-Dimer No results  for input(s): DDIMER in the last 72 hours. Hgb A1c Recent Labs    03/12/24 1902  HGBA1C 8.8*   Lipid Profile No results for input(s): CHOL, HDL, LDLCALC, TRIG, CHOLHDL, LDLDIRECT in the last 72 hours. Thyroid  function studies No results for input(s): TSH, T4TOTAL, T3FREE, THYROIDAB in the last 72 hours.  Invalid input(s): FREET3 Anemia work up No results for input(s): VITAMINB12, FOLATE, FERRITIN, TIBC, IRON, RETICCTPCT in the last 72 hours. Urinalysis    Component Value Date/Time   COLORURINE YELLOW (A) 03/12/2024 1720   APPEARANCEUR CLEAR (A) 03/12/2024 1720   APPEARANCEUR Clear 12/19/2021 0815   LABSPEC 1.022 03/12/2024 1720   PHURINE 6.0 03/12/2024 1720   GLUCOSEU >=500 (A)  03/12/2024 1720   HGBUR SMALL (A) 03/12/2024 1720   BILIRUBINUR NEGATIVE 03/12/2024 1720   BILIRUBINUR Negative 12/19/2021 0815   KETONESUR 20 (A) 03/12/2024 1720   PROTEINUR >=300 (A) 03/12/2024 1720   NITRITE NEGATIVE 03/12/2024 1720   LEUKOCYTESUR NEGATIVE 03/12/2024 1720   Sepsis Labs Recent Labs  Lab 03/12/24 0556 03/13/24 0643  WBC 9.2 10.7*   Microbiology Recent Results (from the past 240 hours)  CSF culture w Gram Stain     Status: None (Preliminary result)   Collection Time: 03/12/24  5:13 PM   Specimen: PATH Cytology CSF; Cerebrospinal Fluid  Result Value Ref Range Status   Specimen Description   Final    CSF Performed at [facility], [address] [place], [place] [address]    Special Requests   Final    NONE Performed at [facility], [address] [place], [place] [address]    Gram Stain   Final    NO ORGANISMS SEEN RED BLOOD CELLS PRESENT WBC SEEN Performed at [facility], [address] [place], [place] [address]    Culture   Final    NO GROWTH < 24 HOURS Performed at [facility], [address], [place], [place] [address]    Report Status PENDING  Incomplete  Resp panel by RT-PCR (RSV, Flu A&B, Covid) Anterior Nasal Swab     Status: None   Collection Time: 03/12/24  5:20 PM   Specimen: Anterior Nasal Swab  Result Value Ref Range Status   SARS Coronavirus 2 by RT PCR NEGATIVE NEGATIVE Final    Comment: (NOTE) SARS-CoV-2 target nucleic acids are NOT DETECTED.  The SARS-CoV-2 RNA is generally detectable in upper respiratory specimens during the acute phase of infection. The lowest concentration of SARS-CoV-2 viral copies this assay can detect is 138 copies/mL. A negative result does not preclude SARS-Cov-2 infection and should not be used as the sole basis for treatment or other patient management decisions. A negative result may occur with  improper specimen collection/handling, submission of specimen  other than nasopharyngeal swab, presence of viral mutation(s) within the areas targeted by this assay, and inadequate number of viral copies(<138 copies/mL). A negative result must be combined with clinical observations, patient history, and epidemiological information. The expected result is Negative.  Fact Sheet for Patients:  bloggercourse.com  Fact Sheet for Healthcare Providers:  seriousbroker.it  This test is no t yet approved or cleared by the [place]  FDA and  has been authorized for detection and/or diagnosis of SARS-CoV-2 by FDA under an Emergency Use Authorization (EUA). This EUA will remain  in effect (meaning this test can be used) for the duration of the COVID-19 declaration under Section 564(b)(1) of the Act, 21 U.S.C.section 360bbb-3(b)(1), unless the authorization is terminated  or revoked sooner.       Influenza A by PCR NEGATIVE NEGATIVE Final   Influenza B by PCR NEGATIVE NEGATIVE Final    Comment: (NOTE) The Xpert Xpress SARS-CoV-2/FLU/RSV plus assay is intended as an aid in the diagnosis of influenza from Nasopharyngeal swab specimens and should not be used as a sole basis for treatment. Nasal washings and aspirates are unacceptable for Xpert Xpress SARS-CoV-2/FLU/RSV testing.  Fact Sheet for Patients: bloggercourse.com  Fact Sheet for Healthcare Providers: seriousbroker.it  This test is not yet approved or cleared by the [place]  FDA and has been authorized for detection and/or diagnosis of SARS-CoV-2 by FDA under an Emergency Use Authorization (EUA). This EUA will remain in effect (meaning this test can be used) for the duration of the COVID-19 declaration under Section 564(b)(1) of the Act, 21 U.S.C. section 360bbb-3(b)(1), unless the authorization is terminated or revoked.     Resp Syncytial Virus by PCR NEGATIVE NEGATIVE Final     Comment: (NOTE) Fact Sheet for Patients: bloggercourse.com  Fact Sheet for Healthcare Providers: seriousbroker.it  This test is not yet approved or cleared by the [place]  FDA and has been authorized for detection and/or diagnosis of SARS-CoV-2 by FDA under an Emergency Use Authorization (EUA). This EUA will remain in effect (meaning this test can be used) for the duration of the COVID-19 declaration under Section 564(b)(1) of the Act, 21 U.S.C. section 360bbb-3(b)(1), unless the authorization is terminated or revoked.  Performed at [facility], [address] [place], [place] [address]   Blood Culture (routine x 2)     Status: None (Preliminary result)   Collection Time: 03/12/24 11:30 PM   Specimen: BLOOD  Result Value Ref Range Status   Specimen Description BLOOD BLOOD RIGHT FOREARM  Final   Special Requests   Final    BOTTLES DRAWN AEROBIC AND ANAEROBIC Blood Culture adequate volume   Culture   Final    NO GROWTH < 12 HOURS Performed at [facility], [address] [place], [place] [address]    Report Status PENDING  Incomplete  Blood Culture (routine x 2)     Status: None (Preliminary result)   Collection Time: 03/12/24 11:30 PM   Specimen: BLOOD  Result Value Ref Range Status   Specimen Description BLOOD BLOOD RIGHT HAND  Final   Special Requests   Final    BOTTLES DRAWN AEROBIC AND ANAEROBIC Blood Culture adequate volume   Culture   Final    NO GROWTH < 12 HOURS Performed at [facility], [address] [place], [place] [address]    Report Status PENDING  Incomplete     Total time spend on discharging this patient, including the last patient exam, discussing the hospital stay, instructions for ongoing care as it relates to all pertinent caregivers, as well as preparing the medical discharge records, prescriptions, and/or referrals as applicable, is 45 minutes.    Ellouise Haber,  MD  [facility] 03/13/2024, 5:06 PM       [1]  Allergies Allergen Reactions   Tirzepatide    Gramineae Pollens Other (See Comments)    Sinus   "

## 2024-03-13 NOTE — ED Notes (Signed)
 SpO2 85% while sleeping, placed on 2 L [place]

## 2024-03-13 NOTE — Progress Notes (Signed)
 PT Cancellation Note  Patient Details Name: Fernando Vega MRN: [id number] DOB: 1970-04-29   Cancelled Treatment:    Reason Eval/Treat Not Completed: Other (comment).  Chart reviewed and attempted to see pt.  Pt soundly sleeping with blanket covering face.  Pt unable to be awakened with therapist speaking to pt.  Will re-attempt at later date/time as medically appropriate.   Fonda Simpers, PT, DPT Physical Therapist - [facility]  03/13/2024, 1:24 PM

## 2024-03-13 NOTE — ED Notes (Signed)
 Pt continually yelling for food and drink. Pt reminded that doctor has been messaged regarding diet change

## 2024-03-13 NOTE — Evaluation (Signed)
 Physical Therapy Evaluation Patient Details Name: Fernando Vega MRN: [id number] DOB: [birth year] Today's Date: 03/13/2024  History of Present Illness  Fernando Vega is a 54 y.o. year old male with medical history of hypertension, hyperlipidemia, type 2 diabetes, history of diabetic foot infection status post transmetatarsal amputation, cluster headaches, presenting to the ED with severe headache that started overnight. Headache was described as most severe headache of his life. Headache was localized to his right eye. Patient was noted to be altered. On arrival to the ED patient was noted to be HDS stable. CT head without contrast obtained that did not show any acute findings. MRI was limited due to being terminated early but did not show any acute findings. CTA of head did not show any acute findings.   Clinical Impression  Pt received in Semi-Fowler's position and agreeable to therapy.  Pt noted he was very sleepy in the ED due to the morphine , and he was still somewhat sleepy, but it had gotten a little better since being transferred to new room.  Pt performed transfers well in the bed and was able to come upright with good technique.  Pt elected to utilize the IV pole and therapist assisted in guiding it at times to prevent pt from tripping over the base.  Pt was able to ambulation down to brother's room and visit before returning to his room and sitting in the recliner.  Pt was given meal tray and all needs met with call bell within reach.          If plan is discharge home, recommend the following: A little help with bathing/dressing/bathroom;A little help with walking and/or transfers;Help with stairs or ramp for entrance   Can travel by private vehicle        Equipment Recommendations None recommended by PT  Recommendations for Other Services       Functional Status Assessment Patient has had a recent decline in their functional status and demonstrates the ability to make significant  improvements in function in a reasonable and predictable amount of time.     Precautions / Restrictions        Mobility  Bed Mobility Overal bed mobility: Needs Assistance Bed Mobility: Supine to Sit, Sit to Supine     Supine to sit: Supervision Sit to supine: Supervision        Transfers Overall transfer level: Needs assistance Equipment used: None Transfers: Sit to/from Stand Sit to Stand: Supervision                Ambulation/Gait Ambulation/Gait assistance: Contact guard assist Gait Distance (Feet): 100 Feet Assistive device: IV Pole Gait Pattern/deviations: WFL(Within Functional Limits), Step-through pattern       General Gait Details: pt mobilized well with the use of the IV pole, as cane was not present within the room.  Stairs            Wheelchair Mobility     Tilt Bed    Modified Rankin (Stroke Patients Only)       Balance Overall balance assessment: Needs assistance Sitting-balance support: Single extremity supported Sitting balance-Leahy Scale: Good     Standing balance support: No upper extremity supported Standing balance-Leahy Scale: Fair Standing balance comment: Pt's SPC and speciality shoe not available in room; anticipate that pt's balance would have been improved with his usual footware  Pertinent Vitals/Pain Pain Assessment Pain Assessment: No/denies pain    Home Living Family/patient expects to be discharged to:: Private residence Living Arrangements: Spouse/significant other Available Help at Discharge: Family Type of Home: House Home Access: Stairs to enter Entrance Stairs-Rails: None Entrance Stairs-Number of Steps: 3   Home Layout: One level Home Equipment: Cane - single point;Wheelchair - Forensic Psychologist (2 wheels);Grab bars - tub/shower;Grab bars - toilet      Prior Function Prior Level of Function : Independent/Modified Independent              Mobility Comments: Walks with cane in community, largely does not use DME in home ADLs Comments: Pt and wife report he is IND in B/IADLs, sits down in tub for baths. Drives, cooks, clean. Pt is on disability/not working     Extremity/Trunk Assessment   Upper Extremity Assessment Upper Extremity Assessment: Overall WFL for tasks assessed    Lower Extremity Assessment Lower Extremity Assessment: Overall WFL for tasks assessed    Cervical / Trunk Assessment Cervical / Trunk Assessment: Normal  Communication   Communication Communication: No apparent difficulties    Cognition Arousal: Lethargic Behavior During Therapy: WFL for tasks assessed/performed                                     Cueing       General Comments      Exercises     Assessment/Plan    PT Assessment Patient needs continued PT services  PT Problem List Decreased strength;Decreased activity tolerance;Decreased balance;Decreased mobility;Obesity       PT Treatment Interventions DME instruction;Gait training;Stair training;Functional mobility training;Therapeutic activities;Therapeutic exercise;Balance training;Neuromuscular re-education    PT Goals (Current goals can be found in the Care Plan section)  Acute Rehab PT Goals Patient Stated Goal: to get back home PT Goal Formulation: With patient Time For Goal Achievement: 03/27/24 Potential to Achieve Goals: Good    Frequency Min 2X/week     Co-evaluation               AM-PAC PT 6 Clicks Mobility  Outcome Measure Help needed turning from your back to your side while in a flat bed without using bedrails?: None Help needed moving from lying on your back to sitting on the side of a flat bed without using bedrails?: None Help needed moving to and from a bed to a chair (including a wheelchair)?: None Help needed standing up from a chair using your arms (e.g., wheelchair or bedside chair)?: A Little Help needed to walk in  hospital room?: A Little Help needed climbing 3-5 steps with a railing? : A Little 6 Click Score: 21    End of Session Equipment Utilized During Treatment: Gait belt Activity Tolerance: Patient tolerated treatment well Patient left: in chair;with call bell/phone within reach;with chair alarm set Nurse Communication: Mobility status PT Visit Diagnosis: Unsteadiness on feet (R26.81);Other abnormalities of gait and mobility (R26.89);Muscle weakness (generalized) (M62.81);Difficulty in walking, not elsewhere classified (R26.2)    Time: 8596-8575 PT Time Calculation (min) (ACUTE ONLY): 21 min   Charges:   PT Evaluation $PT Eval Low Complexity: 1 Low   PT General Charges $$ ACUTE PT VISIT: 1 Visit         Fonda Simpers, PT, DPT Physical Therapist - [facility]  03/13/2024, 4:40 PM

## 2024-03-13 NOTE — Progress Notes (Addendum)
 "       Overnight   NAME: Fernando Vega MRN: [id number] DOB : [birth year]    Date of Service   03/13/2024   HPI/Events of Note   HPI: 54 year old male with medical history of hypertension hyperlipidemia type 2 diabetes history of diabetic foot infection status post metatarsal amputation cluster headaches presenting to the ED with severe headache that started overnight.  Patient reported this headache was the most severe headache of his life.  Given a headache persisted EMS was called headache was localized to his right eye patient was noted to be altered on arrival to the ED patient was noted to be HDS stabl CT head without contrast obtained that did not show any acute findings MRI was limited due to being terminated early but did not show any acute findings.  Lumbar puncture obtained that is pending neurology consulted by ED physician and will see the patient.  Given patient presentation patient has been admitted to the [facility] service   Overnight: RN notified patient is becoming increasingly more confused difficult to reorient unable to get patient to get in bed.  1 mg IV Ativan ordered.  On arrival to room patient lethargic, laying in bed.  Increased work of breathing.  Patient will open eyes to painful stimuli, will not speak, but will follow commands for me in his right arm however assessed patient using left arm, left leg freely.   Physical Exam Vitals reviewed.  Constitutional:      Appearance: He is obese. He is ill-appearing.  Eyes:     Pupils:     Right eye: Pupil is not round.     Left eye: Pupil is not round.  Cardiovascular:     Rate and Rhythm: Tachycardia present.     Pulses:          Radial pulses are 2+ on the right side and 2+ on the left side.     Heart sounds: S1 normal and S2 normal.  Pulmonary:     Effort: Tachypnea present.     Breath sounds: Examination of the right-upper field reveals decreased breath sounds. Examination of the left-upper field reveals decreased breath  sounds. Decreased breath sounds present.  Abdominal:     General: Abdomen is protuberant. There is distension.     Hernia: A hernia is present. Hernia is present in the umbilical area.  Musculoskeletal:     Right Lower Extremity: Right leg is amputated below ankle.  Skin:    General: Skin is warm and dry.     Capillary Refill: Capillary refill takes 2 to 3 seconds.  Neurological:     Mental Status: He is lethargic.     GCS: GCS eye subscore is 2. GCS verbal subscore is 1. GCS motor subscore is 6.     Motor: Weakness and tremor present.       Interventions/ Plan   ABG-normal  Antibiotics for community-acquired pneumonia started UDS, tox screen acetaminophen  and salicylate acid levels ordered- all normal    Updates 0300-at this time RN reported increased agitation again patient attempting to get out of bed thrashing around or moving lines she was concern for patient's safety.  IV Haldol ordered.  post Haldol administration patient resting quietly in bed.  Plan to attempt MRI at this time.  Due to physical appearance I obtained a CIWA assessment patient was currently a 17.  However no history of alcohol abuse. Called family to confirm history.  Wife reported his headache began yesterday she  reports he has a history of cluster headaches.  She reports that he attempted to take a bath to assist with pain management and when he was attempting to get out of the bathtub he did fall she is unaware if he hit anything or hurt anything at this time he then came out of the bathroom and requested she call EMS due to the severity of headache.    I informed her that the patient has been in and out of sleep, with increasing  confusion, delirious, and agitation overnight.  She reports patient was not confused on day shift when she was here however thinks that he is opioid naive, contributing to his orientation status.  Last dose of opioid medication yesterday morning at 9 AM.  The ED provider noted the patient  was confused on arrival, unaware of baseline.    Lynnda Wiersma Donati- Aram BSN RN CCRN AGACNP-BC Acute Care Nurse Practitioner [facility] [place]  "

## 2024-03-13 NOTE — ED Notes (Signed)
 Pt yelling loudly for food. Pt answering orientation questions appropriately at this time. Dr Awanda raker regarding diet status.

## 2024-03-14 LAB — VOLATILES,BLD-ACETONE,ETHANOL,ISOPROP,METHANOL
Acetone, blood: <0.01 g/dL (ref 0.000–0.010)
Ethanol, blood: <0.01 g/dL (ref 0.000–0.010)
Isopropanol, blood: <0.01 g/dL (ref 0.000–0.010)
Methanol, blood: <0.01 g/dL (ref 0.000–0.010)

## 2024-03-14 LAB — URINE CULTURE

## 2024-03-15 LAB — LEGIONELLA PNEUMOPHILA SEROGP 1 UR AG: L. pneumophila Serogp 1 Ur Ag: NEGATIVE

## 2024-03-15 LAB — CSF CULTURE W GRAM STAIN
Culture: NO GROWTH
Gram Stain: NONE SEEN

## 2024-03-17 LAB — CULTURE, BLOOD (ROUTINE X 2)
Special Requests: ADEQUATE
Special Requests: ADEQUATE

## 2024-03-27 NOTE — Progress Notes (Unsigned)
 "    03/28/2024 11:39 AM   Fernando Vega 04/11/1970 [id number]  Referring provider: Osa Geralds, NP [address] [place],  [place] [address]  Urological history: 1. 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  Two left renal artery, early branching left renal vein  - on active surveillance - Scheduled for return in June 2026  2. BPH with LU TS - PSA (02/2024) 2.1 - cysto (2023) mild prostatic enlargement - failed tamsulosin 0.4 mg daily  - failed alfuzosin  10 mg daily  - tadalafil  5 mg daily   3.  Erectile dysfunction - failed PDE5i's   No chief complaint on file.  HPI: Fernando Vega is a 54 y.o. man who presents today for 6 weeks follow up.   Previous records reviewed.  I PSS ***  He reports sensation of incomplete bladder emptying,   urinary frequency,   urinary intermittency,   urinary urgency,   a weak urinary stream,   having to strain to void,   nocturia x ***,   leaking before being able to reach the restroom,   leaking with coughing,   leaking without awareness,   and post void dribbling.     He is wearing *** pads//depends  daily.    Patient denies any modifying or aggravating factors.  Patient denies any recent UTI's, gross hematuria, dysuria or suprapubic/flank pain.  Patient denies any fevers, chills, nausea or vomiting.  ***  He has a family history of PCa, colon cancer, ovarian cancer and/or breast cancer with ***.   He does not have a family history of PCa, colon cancer, ovarian cancer, and/or breast cancer .***     PVR***  PSA (02/2024) 2.1   Serum creatinine (03/2024) 0.91  Hemoglobin A1c (02/2024) 8.8  PMH: Past Medical History:  Diagnosis Date   Diabetes mellitus without complication (HCC)    Hypertension    Neuropathy    Sarcoidosis    Sleep apnea     Surgical History: Past Surgical History:  Procedure Laterality Date   AMPUTATION  Right 12/13/2020   Procedure: AMPUTATION midFOOT-RIGHT;  Surgeon: Silva Juliene JONELLE, DPM;  Location: [facility] ORS;  Service: Podiatry;  Laterality: Right;   AMPUTATION Right 11/05/2020   Procedure: AMPUTATION RAY-Partial Ray Amp 2nd & 3rd Digit;  Surgeon: Janit Thresa HERO, DPM;  Location: [facility];  Service: Podiatry;  Laterality: Right;   APPLICATION OF WOUND VAC Right 11/09/2020   Procedure: APPLICATION OF WOUND VAC;  Surgeon: Janit Thresa HERO, DPM;  Location: [facility];  Service: Podiatry;  Laterality: Right;   BONE BIOPSY Right 01/08/2021   Procedure: BONE BIOPSY;  Surgeon: Gershon Donnice JONELLE, DPM;  Location: [facility];  Service: Podiatry;  Laterality: Right;   COLONOSCOPY WITH PROPOFOL  N/A 07/19/2021   Procedure: COLONOSCOPY WITH PROPOFOL ;  Surgeon: Therisa Bi, MD;  Location: [facility] ENDOSCOPY;  Service: Gastroenterology;  Laterality: N/A;   COLONOSCOPY WITH PROPOFOL  N/A 11/23/2021   Procedure: COLONOSCOPY WITH PROPOFOL ;  Surgeon: Therisa Bi, MD;  Location: [facility];  Service: Gastroenterology;  Laterality: N/A;   COLONOSCOPY WITH PROPOFOL  N/A 02/07/2023   Procedure: COLONOSCOPY WITH PROPOFOL ;  Surgeon: Therisa Bi, MD;  Location: [facility] END[facility]COPY;  Service: Gastroenterology;  Laterality: N/A;   INCISION AND DRAINAGE Right 11/01/2020   Procedure: INCISION AND DRAINAGE-Right Foot;  Surgeon: Silva Juliene JONELLE, DPM;  Location: [facility];  Service: Podiatry;  Laterality: Right;   IRRIGATION AND DEBRIDEMENT FOOT Right  11/09/2020   Procedure: IRRIGATION AND DEBRIDEMENT FOOT;  Surgeon: Janit Thresa HERO, DPM;  Location: [facility];  Service: Podiatry;  Laterality: Right;   IRRIGATION AND DEBRIDEMENT FOOT Right 12/13/2020   Procedure: IRRIGATION AND DEBRIDEMENT FOOT;  Surgeon: Silva Juliene SAUNDERS, DPM;  Location: [facility];  Service: Podiatry;  Laterality: Right;   TRANSMETATARSAL AMPUTATION Right 01/08/2021   Procedure: LISFRANC  AMPUTATION;  Surgeon: Gershon Donnice SAUNDERS, DPM;  Location: [facility];  Service: Podiatry;   Laterality: Right;   WOUND DEBRIDEMENT Right 12/16/2020   Procedure: DEBRIDEMENT WOUND;  Surgeon: Tobie Franky SQUIBB, DPM;  Location: [facility] OR[facility];  Service: Podiatry;  Laterality: Right;  Transmet amputation washout & closure   WOUND DEBRIDEMENT Right 01/08/2021   Procedure: DEBRIDEMENT WOUND;  Surgeon: Gershon Donnice SAUNDERS, DPM;  Location: [facility] OR[facility];  Service: Podiatry;  Laterality: Right;    Home Medications:  Allergies as of 03/28/2024       Reactions   Tirzepatide    Gramineae Pollens Other (See Comments)   Sinus        Medication List        Accurate as of March 27, 2024 11:39 AM. If you have any questions, ask your nurse or doctor.          acetaminophen  500 MG tablet Commonly known as: TYLENOL  Take 1 tablet (500 mg total) by mouth every 6 (six) hours as needed for mild pain (or Fever >/= 101).   alfuzosin  10 MG 24 hr tablet Commonly known as: UROXATRAL  Take 1 tablet (10 mg total) by mouth daily with breakfast.   amLODipine  5 MG tablet Commonly known as: NORVASC  Take 5 mg by mouth daily.   atorvastatin  20 MG tablet Commonly known as: LIPITOR Take 20 mg by mouth daily.   Dexcom G7 Receiver Devi SMARTSIG:1 Unit(s) As Directed   Dexcom G7 Sensor Misc SMARTSIG:1 device Topical Once a Month   fluticasone  50 MCG/ACT nasal spray Commonly known as: FLONASE  Place 1 spray into both nostrils 2 (two) times daily.   Gvoke HypoPen 2-Pack 1 MG/0.2ML Soaj Generic drug: Glucagon GVOKE HYPOPEN 2-PACK 1 MG/0.2ML SOAJ   Jardiance 10 MG Tabs tablet Generic drug: empagliflozin Take 10 mg by mouth daily.   [employer] Pen Needles 32G X 4 MM Misc Generic drug: Insulin  Pen Needle KROGER PEN NEEDLES 32G X 4 MM   Lancets Ultra Thin Misc LANCETS ULTRA THIN   Lantus  SoloStar 100 UNIT/ML Solostar Pen Generic drug: insulin  glargine Inject 50 Units into the skin daily. Home med.   losartan  25 MG tablet Commonly known as: COZAAR  Take 25 mg by mouth daily.   Mounjaro 2.5 MG/0.5ML  Pen Generic drug: tirzepatide MOUNJARO 2.5 MG/0.5ML SOAJ   multivitamin with minerals Tabs tablet Take 1 tablet by mouth once daily.   NovoLOG  FlexPen 100 UNIT/ML FlexPen Generic drug: insulin  aspart Four times daily. 15-15-15-20.   Olopatadine HCl 0.2 % Soln Apply 1 drop to eye daily.   pantoprazole  40 MG tablet Commonly known as: PROTONIX  Take 1 tablet (40 mg total) by mouth once daily.   sildenafil  20 MG tablet Commonly known as: REVATIO  Take 3-5 tablets about one hour prior to intercourse. Do not exceed 5 tablets per dose.   Ventolin  HFA 108 (90 Base) MCG/ACT inhaler Generic drug: albuterol  VENTOLIN  HFA 108 (90 Base) MCG/ACT AERS   vitamin C with rose hips 500 MG tablet Take 500 mg by mouth daily.   ZyrTEC  Allergy 10 MG tablet Generic drug: cetirizine  ZYRTEC  ALLERGY 10 MG TABS  Allergies: Allergies[1]  Family History: Family History  Problem Relation Age of Onset   Hypertension Mother    Diabetes type II Father    Parkinson's disease Father    Heart disease Father     Social History:  reports that he has never smoked. He has never been exposed to tobacco smoke. He has never used smokeless tobacco. He reports that he does not drink alcohol and does not use drugs.  ROS: Pertinent ROS in HPI  Physical Exam: There were no vitals taken for this visit.  Constitutional:  Well nourished. Alert and oriented, No acute distress. HEENT: [place] AT, moist mucus membranes.  Trachea midline, no masses. Cardiovascular: No clubbing, cyanosis, or edema. Respiratory: Normal respiratory effort, no increased work of breathing. GI: Abdomen is soft, non tender, non distended, no abdominal masses. Liver and spleen not palpable.  No hernias appreciated.  Stool sample for occult testing is not indicated.   GU: No CVA tenderness.  No bladder fullness or masses.  Patient with circumcised/uncircumcised phallus. ***Foreskin easily retracted***  Urethral meatus is patent.  No  penile discharge. No penile lesions or rashes. Scrotum without lesions, cysts, rashes and/or edema.  Testicles are located scrotally bilaterally. No masses are appreciated in the testicles. Left and right epididymis are normal. Rectal: Patient with  normal sphincter tone. Anus and perineum without scarring or rashes. No rectal masses are appreciated. Prostate is approximately *** grams, *** nodules are appreciated. Seminal vesicles are normal. Skin: No rashes, bruises or suspicious lesions. Lymph: No cervical or inguinal adenopathy. Neurologic: Grossly intact, no focal deficits, moving all 4 extremities. Psychiatric: Normal mood and affect.  Laboratory Data: See Epic and HPI   I have reviewed the labs.   Pertinent Imaging: ***  Assessment & Plan:  ***  1. BPH with LU TS - mild, moderate severe symptoms *** and he is *** - no signs of retention, infection or malignancy *** - PSA up to date  - DRE benign *** - UA benign *** - PVR < 300 cc *** - most bothersome symptoms are *** - encouraged avoiding bladder irritants, fluid restriction before bedtime and timed voiding's - Initiate alpha-blocker (***), discussed side effects *** - Initiate 5 alpha reductase inhibitor (***), discussed side effects *** - Continue tamsulosin 0.4 mg daily, alfuzosin  10 mg daily, Rapaflo  8 mg daily, terazosin, doxazosin, Cialis  5 mg daily and finasteride 5 mg daily, dutasteride 0.5 mg daily***:refills given - Cannot tolerate medication or medication failure, schedule cystoscopy *** - educated on red flag symptoms: acute retention, gross hematuria, fever, severe pain - advised to call clinic or go to the ED if these occur - return to clinic in *** symptom re-evaluation ***  2. ED -      No follow-ups on file.  These notes generated with voice recognition software. I apologize for typographical errors.  CLOTILDA CORNWALL, PA-C  [facility] [address]  [address] [place], [place] [address] [phone]     [1]  Allergies Allergen Reactions   Tirzepatide    Gramineae Pollens Other (See Comments)    Sinus   "

## 2024-03-28 ENCOUNTER — Ambulatory Visit: Admitting: Urology

## 2024-03-28 DIAGNOSIS — N401 Enlarged prostate with lower urinary tract symptoms: Secondary | ICD-10-CM

## 2024-03-28 DIAGNOSIS — N529 Male erectile dysfunction, unspecified: Secondary | ICD-10-CM

## 2024-04-02 NOTE — Progress Notes (Signed)
 "    04/03/2024 12:23 PM   Fernando Vega 10/12/1970 [id number]  Referring provider: Osa Geralds, NP [address] [place],  [place] [address]  Urological history: 1. 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  Two left renal artery, early branching left renal vein  - on active surveillance - Scheduled for return in June 2026  2. BPH with LU TS - PSA (02/2024) 2.1 - cysto (2023) mild prostatic enlargement - failed tamsulosin 0.4 mg daily  - failed alfuzosin  10 mg daily  - tadalafil  5 mg daily   3.  Erectile dysfunction - failed PDE5i's   4. Family medical history of prostate cancer - father with fatal  prostate cancer - passed at age 44  Chief Complaint  Patient presents with   Elevated PSA   HPI: Fernando Vega is a 54 y.o. man who presents today for 6 weeks follow up.   Previous records reviewed.  He was under the impression that this visit was to repeat his imaging.    I PSS 6/1  He reports urinary urgency and nocturia x 1.  He is not having any incontinence.   Patient denies any modifying or aggravating factors.  Patient denies any recent UTI's, gross hematuria, dysuria or suprapubic/flank pain.  Patient denies any fevers, chills, nausea or vomiting.    He is taking the alfuzosin  ER 10 mg daily.  PVR 125 mL   PSA (02/2024) 2.1   Serum creatinine (03/2024) 0.91  Hemoglobin A1c (02/2024) 8.8  PMH: Past Medical History:  Diagnosis Date   Diabetes mellitus without complication (HCC)    Hypertension    Neuropathy    Sarcoidosis    Sleep apnea     Surgical History: Past Surgical History:  Procedure Laterality Date   AMPUTATION Right 12/13/2020   Procedure: AMPUTATION midFOOT-RIGHT;  Surgeon: Silva Juliene JONELLE, DPM;  Location: [facility];  Service: Podiatry;  Laterality: Right;   AMPUTATION Right 11/05/2020   Procedure: AMPUTATION RAY-Partial Ray Amp 2nd & 3rd Digit;  Surgeon:  Janit Thresa HERO, DPM;  Location: [facility];  Service: Podiatry;  Laterality: Right;   APPLICATION OF WOUND VAC Right 11/09/2020   Procedure: APPLICATION OF WOUND VAC;  Surgeon: Janit Thresa HERO, DPM;  Location: [facility];  Service: Podiatry;  Laterality: Right;   BONE BIOPSY Right 01/08/2021   Procedure: BONE BIOPSY;  Surgeon: Gershon Donnice JONELLE, DPM;  Location: [facility];  Service: Podiatry;  Laterality: Right;   COLONOSCOPY WITH PROPOFOL  N/A 07/19/2021   Procedure: COLONOSCOPY WITH PROPOFOL ;  Surgeon: Therisa Bi, MD;  Location: [facility] END[facility]COPY;  Service: Gastroenterology;  Laterality: N/A;   COLONOSCOPY WITH PROPOFOL  N/A 11/23/2021   Procedure: COLONOSCOPY WITH PROPOFOL ;  Surgeon: Therisa Bi, MD;  Location: [facility] ENDOSCOPY;  Service: Gastroenterology;  Laterality: N/A;   COLONOSCOPY WITH PROPOFOL  N/A 02/07/2023   Procedure: COLONOSCOPY WITH PROPOFOL ;  Surgeon: Therisa Bi, MD;  Location: [facility] ENDOSCOPY;  Service: Gastroenterology;  Laterality: N/A;   INCISION AND DRAINAGE Right 11/01/2020   Procedure: INCISION AND DRAINAGE-Right Foot;  Surgeon: Silva Juliene JONELLE, DPM;  Location: [facility];  Service: Podiatry;  Laterality: Right;   IRRIGATION AND DEBRIDEMENT FOOT Right 11/09/2020   Procedure: IRRIGATION AND DEBRIDEMENT FOOT;  Surgeon: Janit Thresa HERO, DPM;  Location: [facility];  Service: Podiatry;  Laterality: Right;   IRRIGATION AND DEBRIDEMENT FOOT Right 12/13/2020   Procedure: IRRIGATION AND DEBRIDEMENT FOOT;  Surgeon: Silva Juliene JONELLE,  DPM;  Location: [facility];  Service: Podiatry;  Laterality: Right;   TRANSMETATARSAL AMPUTATION Right 01/08/2021   Procedure: LISFRANC  AMPUTATION;  Surgeon: Gershon Donnice SAUNDERS, DPM;  Location: [facility];  Service: Podiatry;  Laterality: Right;   WOUND DEBRIDEMENT Right 12/16/2020   Procedure: DEBRIDEMENT WOUND;  Surgeon: Tobie Franky SQUIBB, DPM;  Location: [facility];  Service: Podiatry;  Laterality: Right;  Transmet amputation washout & closure   WOUND DEBRIDEMENT Right 01/08/2021    Procedure: DEBRIDEMENT WOUND;  Surgeon: Gershon Donnice SAUNDERS, DPM;  Location: [facility];  Service: Podiatry;  Laterality: Right;    Home Medications:  Allergies as of 04/03/2024       Reactions   Tirzepatide    Gramineae Pollens Other (See Comments)   Sinus        Medication List        Accurate as of April 03, 2024 11:59 PM. If you have any questions, ask your nurse or doctor.          acetaminophen  500 MG tablet Commonly known as: TYLENOL  Take 1 tablet (500 mg total) by mouth every 6 (six) hours as needed for mild pain (or Fever >/= 101).   alfuzosin  10 MG 24 hr tablet Commonly known as: UROXATRAL  Take 1 tablet (10 mg total) by mouth daily with breakfast.   amLODipine  5 MG tablet Commonly known as: NORVASC  Take 5 mg by mouth daily.   atorvastatin  20 MG tablet Commonly known as: LIPITOR Take 20 mg by mouth daily.   Dexcom G7 Receiver Devi SMARTSIG:1 Unit(s) As Directed   Dexcom G7 Sensor Misc SMARTSIG:1 device Topical Once a Month   fluticasone  50 MCG/ACT nasal spray Commonly known as: FLONASE  Place 1 spray into both nostrils 2 (two) times daily.   Gvoke HypoPen 2-Pack 1 MG/0.2ML Soaj Generic drug: Glucagon GVOKE HYPOPEN 2-PACK 1 MG/0.2ML SOAJ   Jardiance 10 MG Tabs tablet Generic drug: empagliflozin Take 10 mg by mouth daily.   [employer] Pen Needles 32G X 4 MM Misc Generic drug: Insulin  Pen Needle KROGER PEN NEEDLES 32G X 4 MM   Lancets Ultra Thin Misc LANCETS ULTRA THIN   Lantus  SoloStar 100 UNIT/ML Solostar Pen Generic drug: insulin  glargine Inject 50 Units into the skin daily. Home med.   losartan  25 MG tablet Commonly known as: COZAAR  Take 25 mg by mouth daily.   Mounjaro 2.5 MG/0.5ML Pen Generic drug: tirzepatide MOUNJARO 2.5 MG/0.5ML SOAJ   multivitamin with minerals Tabs tablet Take 1 tablet by mouth once daily.   NovoLOG  FlexPen 100 UNIT/ML FlexPen Generic drug: insulin  aspart Four times daily. 15-15-15-20.   Olopatadine HCl  0.2 % Soln Apply 1 drop to eye daily.   pantoprazole  40 MG tablet Commonly known as: PROTONIX  Take 1 tablet (40 mg total) by mouth once daily.   sildenafil  20 MG tablet Commonly known as: REVATIO  Take 3-5 tablets about one hour prior to intercourse. Do not exceed 5 tablets per dose.   Ventolin  HFA 108 (90 Base) MCG/ACT inhaler Generic drug: albuterol  VENTOLIN  HFA 108 (90 Base) MCG/ACT AERS   vitamin C with rose hips 500 MG tablet Take 500 mg by mouth daily.   ZyrTEC  Allergy 10 MG tablet Generic drug: cetirizine  ZYRTEC  ALLERGY 10 MG TABS        Allergies: Allergies[1]  Family History: Family History  Problem Relation Age of Onset   Hypertension Mother    Diabetes type II Father    Parkinson's disease Father    Heart disease Father  Social History:  reports that he has never smoked. He has never been exposed to tobacco smoke. He has never used smokeless tobacco. He reports that he does not drink alcohol and does not use drugs.  ROS: Pertinent ROS in HPI  Physical Exam: BP (!) 164/80   Pulse 78   Wt 210 lb (95.3 kg)   SpO2 96%   BMI 32.89 kg/m   Constitutional:  Well nourished. Alert and oriented, No acute distress. HEENT: [place] AT, moist mucus membranes.  Trachea midline Cardiovascular: No clubbing, cyanosis, or edema. Respiratory: Normal respiratory effort, no increased work of breathing. Neurologic: Grossly intact, no focal deficits, moving all 4 extremities. Psychiatric: Normal mood and affect.  Laboratory Data: See Epic and HPI   I have reviewed the labs.   Pertinent Imaging:  04/03/24 09:38  Scan Result    Assessment & Plan:    1. BPH with LU TS - mild symptoms and he is delighted with his urinary condition - PSA up to date  - PVR < 300 cc  - encouraged avoiding bladder irritants, fluid restriction before bedtime and timed voiding's - Continue alfuzosin  10 mg daily  2. ED - he and his wife are not sexually active and is not wanting  to pursue further treatment   3. Right renal mass - explained that his next imaging is scheduled in June - reassured patient that the mass was small and if it is a cancer will likely have a low chance of metastasizing  - encouraged him to keep his follow up with Dr.Garren  Return for Keep follow up with Dr. Georganne in June .  These notes generated with voice recognition software. I apologize for typographical errors.  CLOTILDA CORNWALL, PA-C  [facility] [address]  [address] [place], [place] [address] [phone]     [1]  Allergies Allergen Reactions   Tirzepatide    Gramineae Pollens Other (See Comments)    Sinus   "

## 2024-04-03 ENCOUNTER — Ambulatory Visit: Admitting: Urology

## 2024-04-03 VITALS — BP 164/80 | HR 78 | Wt 210.0 lb

## 2024-04-03 DIAGNOSIS — N2889 Other specified disorders of kidney and ureter: Secondary | ICD-10-CM | POA: Diagnosis not present

## 2024-04-03 DIAGNOSIS — N400 Enlarged prostate without lower urinary tract symptoms: Secondary | ICD-10-CM | POA: Diagnosis not present

## 2024-04-03 DIAGNOSIS — N529 Male erectile dysfunction, unspecified: Secondary | ICD-10-CM | POA: Diagnosis not present

## 2024-04-03 LAB — BLADDER SCAN AMB NON-IMAGING

## 2024-04-03 NOTE — Patient Instructions (Addendum)
 Please contact central scheduling at [phone] to schedule the CT scan

## 2024-04-05 ENCOUNTER — Encounter: Payer: Self-pay | Admitting: Urology

## 2024-05-12 ENCOUNTER — Ambulatory Visit: Admitting: Podiatry

## 2024-08-15 ENCOUNTER — Ambulatory Visit: Admitting: Urology
